# Patient Record
Sex: Female | Born: 1960 | Race: White | Hispanic: No | Marital: Married | Smoking: Current every day smoker
Health system: Southern US, Community
[De-identification: ages and names within clinical notes are randomized; demographics above are authoritative.]

## PROBLEM LIST (undated history)

## (undated) DIAGNOSIS — I10 Essential (primary) hypertension: Secondary | ICD-10-CM

## (undated) DIAGNOSIS — K922 Gastrointestinal hemorrhage, unspecified: Secondary | ICD-10-CM

## (undated) DIAGNOSIS — G620 Drug-induced polyneuropathy: Secondary | ICD-10-CM

## (undated) DIAGNOSIS — E7800 Pure hypercholesterolemia, unspecified: Secondary | ICD-10-CM

## (undated) DIAGNOSIS — Z7189 Other specified counseling: Secondary | ICD-10-CM

## (undated) DIAGNOSIS — T451X5A Adverse effect of antineoplastic and immunosuppressive drugs, initial encounter: Secondary | ICD-10-CM

## (undated) DIAGNOSIS — K219 Gastro-esophageal reflux disease without esophagitis: Secondary | ICD-10-CM

## (undated) DIAGNOSIS — C801 Malignant (primary) neoplasm, unspecified: Principal | ICD-10-CM

## (undated) HISTORY — DX: Malignant (primary) neoplasm, unspecified: C80.1

## (undated) HISTORY — DX: Other specified counseling: Z71.89

## (undated) HISTORY — PX: COLONOSCOPY: SHX174

## (undated) HISTORY — PX: TONSILLECTOMY: SUR1361

## (undated) HISTORY — DX: Drug-induced polyneuropathy: G62.0

## (undated) HISTORY — PX: ABDOMINAL HYSTERECTOMY: SHX81

## (undated) HISTORY — DX: Gastrointestinal hemorrhage, unspecified: K92.2

## (undated) HISTORY — DX: Adverse effect of antineoplastic and immunosuppressive drugs, initial encounter: T45.1X5A

---

## 2002-08-02 ENCOUNTER — Ambulatory Visit (HOSPITAL_COMMUNITY): Admission: RE | Admit: 2002-08-02 | Discharge: 2002-08-02 | Payer: Self-pay | Admitting: Internal Medicine

## 2002-08-02 ENCOUNTER — Encounter: Payer: Self-pay | Admitting: Internal Medicine

## 2002-08-11 ENCOUNTER — Emergency Department (HOSPITAL_COMMUNITY): Admission: EM | Admit: 2002-08-11 | Discharge: 2002-08-11 | Payer: Self-pay | Admitting: Emergency Medicine

## 2002-08-20 ENCOUNTER — Other Ambulatory Visit: Admission: RE | Admit: 2002-08-20 | Discharge: 2002-08-20 | Payer: Self-pay | Admitting: Obstetrics & Gynecology

## 2002-09-13 ENCOUNTER — Observation Stay (HOSPITAL_COMMUNITY): Admission: RE | Admit: 2002-09-13 | Discharge: 2002-09-14 | Payer: Self-pay | Admitting: Obstetrics & Gynecology

## 2006-04-26 ENCOUNTER — Emergency Department (HOSPITAL_COMMUNITY): Admission: EM | Admit: 2006-04-26 | Discharge: 2006-04-26 | Payer: Self-pay | Admitting: Emergency Medicine

## 2006-09-22 ENCOUNTER — Ambulatory Visit (HOSPITAL_COMMUNITY): Admission: RE | Admit: 2006-09-22 | Discharge: 2006-09-22 | Payer: Self-pay | Admitting: Family Medicine

## 2006-09-28 ENCOUNTER — Ambulatory Visit (HOSPITAL_COMMUNITY): Admission: RE | Admit: 2006-09-28 | Discharge: 2006-09-28 | Payer: Self-pay | Admitting: Family Medicine

## 2010-12-06 ENCOUNTER — Encounter: Payer: Self-pay | Admitting: Family Medicine

## 2011-04-25 ENCOUNTER — Emergency Department (HOSPITAL_COMMUNITY)
Admission: EM | Admit: 2011-04-25 | Discharge: 2011-04-25 | Disposition: A | Discharge status: Home / Self Care | Payer: PRIVATE HEALTH INSURANCE | Attending: Emergency Medicine | Admitting: Emergency Medicine

## 2011-04-25 DIAGNOSIS — K0401 Reversible pulpitis: Secondary | ICD-10-CM | POA: Insufficient documentation

## 2011-04-25 DIAGNOSIS — K047 Periapical abscess without sinus: Secondary | ICD-10-CM | POA: Insufficient documentation

## 2011-04-25 DIAGNOSIS — K089 Disorder of teeth and supporting structures, unspecified: Secondary | ICD-10-CM | POA: Insufficient documentation

## 2012-03-29 ENCOUNTER — Encounter (HOSPITAL_COMMUNITY): Payer: Self-pay | Admitting: *Deleted

## 2012-03-29 ENCOUNTER — Emergency Department (HOSPITAL_COMMUNITY)
Admission: EM | Admit: 2012-03-29 | Discharge: 2012-03-29 | Disposition: A | Discharge status: Home / Self Care | Payer: PRIVATE HEALTH INSURANCE | Attending: Emergency Medicine | Admitting: Emergency Medicine

## 2012-03-29 DIAGNOSIS — K279 Peptic ulcer, site unspecified, unspecified as acute or chronic, without hemorrhage or perforation: Secondary | ICD-10-CM | POA: Insufficient documentation

## 2012-03-29 DIAGNOSIS — E7800 Pure hypercholesterolemia, unspecified: Secondary | ICD-10-CM | POA: Insufficient documentation

## 2012-03-29 DIAGNOSIS — I10 Essential (primary) hypertension: Secondary | ICD-10-CM | POA: Insufficient documentation

## 2012-03-29 HISTORY — DX: Essential (primary) hypertension: I10

## 2012-03-29 HISTORY — DX: Pure hypercholesterolemia, unspecified: E78.00

## 2012-03-29 LAB — COMPREHENSIVE METABOLIC PANEL
AST: 17 U/L (ref 0–37)
Albumin: 3.9 g/dL (ref 3.5–5.2)
Alkaline Phosphatase: 89 U/L (ref 39–117)
BUN: 34 mg/dL — ABNORMAL HIGH (ref 6–23)
CO2: 24 mEq/L (ref 19–32)
Chloride: 104 mEq/L (ref 96–112)
GFR calc non Af Amer: >90 mL/min (ref >90)
Potassium: 3.7 mEq/L (ref 3.5–5.1)
Total Bilirubin: 0.5 mg/dL (ref 0.3–1.2)

## 2012-03-29 LAB — CBC
HCT: 43 % (ref 36.0–46.0)
Hemoglobin: 13.9 g/dL (ref 12.0–15.0)
MCHC: 32.3 g/dL (ref 30.0–36.0)
RBC: 4.67 MIL/uL (ref 3.87–5.11)
WBC: 8.8 10*3/uL (ref 4.0–10.5)

## 2012-03-29 LAB — DIFFERENTIAL
Basophils Relative: 0 % (ref 0–1)
Lymphocytes Relative: 19 % (ref 12–46)
Monocytes Relative: 5 % (ref 3–12)
Neutro Abs: 6.6 10*3/uL (ref 1.7–7.7)
Neutrophils Relative %: 75 % (ref 43–77)

## 2012-03-29 MED ORDER — PANTOPRAZOLE SODIUM 40 MG IV SOLR
40.0000 mg | Freq: Once | INTRAVENOUS | Status: AC
  Administered 2012-03-29: 40 mg via INTRAVENOUS
  Filled 2012-03-29: qty 40

## 2012-03-29 MED ORDER — PANTOPRAZOLE SODIUM 40 MG PO TBEC: DELAYED_RELEASE_TABLET | ORAL | Status: DC

## 2012-03-29 MED ORDER — ONDANSETRON 4 MG PO TBDP: ORAL_TABLET | ORAL | Status: DC

## 2012-03-29 MED ORDER — SODIUM CHLORIDE 0.9 % IV SOLN
Freq: Once | INTRAVENOUS | Status: AC
  Administered 2012-03-29: 08:00:00 via INTRAVENOUS

## 2012-03-29 NOTE — ED Provider Notes (Signed)
This chart was scribed for Lydia Lennert, MD by Williemae Natter. The patient was seen in room APA06/APA06 at 8:03 AM.  History     CSN: [id number]  Arrival date & time 03/29/12  4098   First MD Initiated Contact with Patient 03/29/12 [phone]      Chief Complaint  Patient presents with  . Hematemesis    (Consider location/radiation/quality/duration/timing/severity/associated sxs/prior treatment) Patient is a 51 y.o. female presenting with hematochezia. The history is provided by the patient. No language interpreter was used.  Rectal Bleeding  The current episode started today. The onset was sudden. The problem occurs rarely. The problem has been unchanged. The patient is experiencing no pain. The stool is described as streaked with blood. There was no prior successful therapy. There was no prior unsuccessful therapy. Associated symptoms include vomiting. Pertinent negatives include no abdominal pain, no diarrhea, no hematuria, no chest pain, no headaches, no coughing and no rash. She has been behaving normally. The last void occurred less than 6 hours ago. Her past medical history is significant for abdominal surgery. Past medical history comments: recent change in medication.   THURSA EMME is a 51 y.o. female who presents to the Emergency Department complaining of hematochezia. Pt woke up at 3 am this morning to have a bm. Stool was black and had streaks of blood in it. Pt vomited around that time and noted blood in vomit. Pt vomited again around 6. Pt regularly takes aspirin for her cholesterol and reported a recent dose change for her aspirin by her PCP. PCP at [facility] Past Medical History  Diagnosis Date  . Hypertension   . Elevated cholesterol     Past Surgical History  Procedure Date  . Abdominal hysterectomy   . Cesarean section   . Tonsillectomy     History reviewed. No pertinent family history.  History  Substance Use Topics  . Smoking status: Current Everyday  Smoker -- 1.0 packs/day  . Smokeless tobacco: Not on file  . Alcohol Use: Yes     occasionally    OB History    Grav Para Term Preterm Abortions TAB SAB Ect Mult Living                  Review of Systems  Constitutional: Negative for fatigue.  HENT: Negative for congestion, sinus pressure and ear discharge.   Eyes: Negative for discharge.  Respiratory: Negative for cough.   Cardiovascular: Negative for chest pain.  Gastrointestinal: Positive for vomiting, blood in stool and hematochezia. Negative for abdominal pain and diarrhea.  Genitourinary: Negative for frequency and hematuria.  Musculoskeletal: Negative for back pain.  Skin: Negative for rash.  Neurological: Negative for seizures and headaches.  Hematological: Negative.   Psychiatric/Behavioral: Negative for hallucinations.  All other systems reviewed and are negative.    Allergies  Review of patient's allergies indicates no known allergies.  Home Medications  No current outpatient prescriptions on file.  BP 155/101  Pulse 86  Temp(Src) 97.9 F (36.6 C) (Oral)  Resp 16  Ht 5\' 5"  (1.651 m)  Wt 160 lb (72.576 kg)  BMI 26.63 kg/m2  SpO2 97%  Physical Exam  Nursing note and vitals reviewed. Constitutional: She is oriented to person, place, and time. She appears well-developed and well-nourished.  HENT:  Head: Normocephalic and atraumatic.  Eyes: Conjunctivae and EOM are normal. No scleral icterus.  Neck: Normal range of motion. Neck supple. No thyromegaly present.  Cardiovascular: Normal rate and regular rhythm.  Exam  reveals no gallop and no friction rub.   No murmur heard. Pulmonary/Chest: Effort normal. No stridor. No respiratory distress. She has no wheezes. She has no rales. She exhibits no tenderness.  Abdominal: She exhibits no distension. There is no tenderness. There is no rebound.  Genitourinary:       Normal rectal exam-heme positive with dark brown stool  Musculoskeletal: Normal range of motion.  She exhibits no edema.  Lymphadenopathy:    She has no cervical adenopathy.  Neurological: She is alert and oriented to person, place, and time. Coordination normal.  Skin: Skin is warm and dry. No rash noted. No erythema.  Psychiatric: She has a normal mood and affect. Her behavior is normal.    ED Course  Procedures (including critical care time) DIAGNOSTIC STUDIES: Oxygen Saturation is 97% on room air, normal by my interpretation.    COORDINATION OF CARE:  Medications  aspirin 325 MG tablet (not administered)  lisinopril-hydrochlorothiazide (PRINZIDE,ZESTORETIC) 20-12.5 MG per tablet (not administered)  pravastatin (PRAVACHOL) 40 MG tablet (not administered)  doxycycline (VIBRA-TABS) 100 MG tablet (not administered)  pantoprazole (PROTONIX) injection 40 mg (40 mg Intravenous Given 03/29/12 0826)  0.9 %  sodium chloride infusion (  Intravenous New Bag/Given 03/29/12 0826)   10:29 AM On recheck, pt notified of lab results and scans.   Labs Reviewed - No data to display No results found.   No diagnosis found.  Pt had no more vomiting or blood in stool in the er  MDM    The chart was scribed for me under my direct supervision.  I personally performed the history, physical, and medical decision making and all procedures in the evaluation of this patient.Lydia Lennert, MD 03/29/12 [phone]

## 2012-03-29 NOTE — Discharge Instructions (Signed)
Follow up with your md tomorrow.  Follow up with dr. Karilyn Cota next week.

## 2012-03-29 NOTE — ED Notes (Signed)
Pt states that she woke up at 3 am and had a BM that was loose and black with bright red streaks. Pt states that she also vomited dark black with red streaks. Pt states that at 7 am she vomited bright red blood with clots. States that she started taking ASA 325 mg daily yesterday and a new cholesterol med.

## 2012-04-05 ENCOUNTER — Ambulatory Visit: Payer: PRIVATE HEALTH INSURANCE | Admitting: Urgent Care

## 2012-04-12 ENCOUNTER — Telehealth: Payer: Self-pay

## 2012-04-12 NOTE — Telephone Encounter (Signed)
Pt has OV with Lydia Moore , NP on 04/17/2012 @ 2:00 pm. Had a recent GI bleed  ( ED at [facility] )

## 2012-04-17 ENCOUNTER — Ambulatory Visit (INDEPENDENT_AMBULATORY_CARE_PROVIDER_SITE_OTHER): Payer: PRIVATE HEALTH INSURANCE | Admitting: Urgent Care

## 2012-04-17 ENCOUNTER — Encounter (HOSPITAL_COMMUNITY): Payer: Self-pay | Admitting: Pharmacy Technician

## 2012-04-17 ENCOUNTER — Other Ambulatory Visit: Payer: Self-pay | Admitting: Gastroenterology

## 2012-04-17 ENCOUNTER — Encounter: Payer: Self-pay | Admitting: Urgent Care

## 2012-04-17 VITALS — BP 168/98 | HR 80 | Temp 98.1°F | Ht 65.5 in | Wt 152.0 lb

## 2012-04-17 DIAGNOSIS — K920 Hematemesis: Secondary | ICD-10-CM | POA: Insufficient documentation

## 2012-04-17 DIAGNOSIS — K921 Melena: Secondary | ICD-10-CM | POA: Insufficient documentation

## 2012-04-17 MED ORDER — OMEPRAZOLE 20 MG PO CPDR: 20.0000 mg | DELAYED_RELEASE_CAPSULE | Freq: Every day | ORAL | Status: DC

## 2012-04-17 MED ORDER — PEG-KCL-NACL-NASULF-NA ASC-C 100 G PO SOLR: 1.0000 | Freq: Once | ORAL | Status: DC

## 2012-04-17 NOTE — Assessment & Plan Note (Addendum)
Lydia Moore is a pleasant 51 y.o. female with history of hematemesis and melena that started after one week of doxycycline and one dose of increased aspirin. Differentials include pill-induced esophageal ulcers, erosions from doxycycline and aspirin, peptic ulcer disease, less likely malignancy. Hemoglobin was normal which is reassuring.  EGD for further evaluation.  She will have colonoscopy at the same time since she is due for screening colonoscopy.  I do not suspect lower GI source. I have discussed risks & benefits which include, but are not limited to, bleeding, infection, perforation & drug reaction.  The patient agrees with this plan & written consent will be obtained.    Begin omeprazole 20 mg daily  If you have multiple dark stools or vomiting blood again, please call us or go immediately to the emergency department 1-800-quit now for help quitting smoking Be sure to start your blood pressure pills or talk with your primary care provider about your concerns

## 2012-04-17 NOTE — Patient Instructions (Addendum)
You'll need a colonoscopy and EGD (upper endoscopy) with Dr. Darrick Penna Begin omeprazole 20 mg daily to help protect and heal your stomach and esophagus If you have multiple dark stools or vomiting blood again, please call us or go immediately to the emergency department 1-800-quit now for help quitting smoking Be sure to start your blood pressure pills or talk with your primary care provider about your concerns

## 2012-04-17 NOTE — Assessment & Plan Note (Signed)
see hematemesis

## 2012-04-17 NOTE — Progress Notes (Signed)
Referring Provider: Cassell Smiles., MD Primary Care Physician:  Cassell Smiles., MD, MD Primary Gastroenterologist:  Dr. Darrick Penna  Chief Complaint  Patient presents with  . Colonoscopy  . Hematemesis  . Melena   HPI:  Lydia Moore is a 51 y.o. female here as a referral from Dr. Sherwood Gambler for hematemesis and melena and to set up colonoscopy. She was recently started on antihypertensive and statin therapy. Her aspirin was increased from 81mg  to 325mg  3 weeks ago by her PCP.  He was also started doxycycline 1 week prior to onset of her symptoms as she was being treated for a tick bite.  Approximately 3am, she awakened with nausea & vomiting bright red blood w/ clots.  He had melena for 2 days.  She went to [facility] ER about 7:30am day the day hematemesis began.  No NSAIDs.  Denies abdominal pain, heartburn or indigestion.  Denies anorexia or early satiety.  Denies dysphagia or odynophagia.   Denies any lower GI symptoms including constipation, diarrhea, rectal bleeding, or weight loss.  Recent Results (from the past 672 hour(s))  CBC   Collection Time   03/29/12  8:27 AM      Component Value Range   WBC 8.8  4.0 - 10.5 (K/uL)   RBC 4.67  3.87 - 5.11 (MIL/uL)   Hemoglobin 13.9  12.0 - 15.0 (g/dL)   HCT 16.1  09.6 - 04.5 (%)   MCV 92.1  78.0 - 100.0 (fL)   MCH 29.8  26.0 - 34.0 (pg)   MCHC 32.3  30.0 - 36.0 (g/dL)   RDW 40.9  81.1 - 91.4 (%)   Platelets 155  150 - 400 (K/uL)  DIFFERENTIAL   Collection Time   03/29/12  8:27 AM      Component Value Range   Neutrophils Relative 75  43 - 77 (%)   Neutro Abs 6.6  1.7 - 7.7 (K/uL)   Lymphocytes Relative 19  12 - 46 (%)   Lymphs Abs 1.7  0.7 - 4.0 (K/uL)   Monocytes Relative 5  3 - 12 (%)   Monocytes Absolute 0.4  0.1 - 1.0 (K/uL)   Eosinophils Relative 1  0 - 5 (%)   Eosinophils Absolute 0.1  0.0 - 0.7 (K/uL)   Basophils Relative 0  0 - 1 (%)   Basophils Absolute 0.0  0.0 - 0.1 (K/uL)  COMPREHENSIVE METABOLIC PANEL   Collection Time   03/29/12  8:27 AM      Component Value Range   Sodium 141  135 - 145 (mEq/L)   Potassium 3.7  3.5 - 5.1 (mEq/L)   Chloride 104  96 - 112 (mEq/L)   CO2 24  19 - 32 (mEq/L)   Glucose, Bld 111 (*) 70 - 99 (mg/dL)   BUN 34 (*) 6 - 23 (mg/dL)   Creatinine, Ser 7.82  0.50 - 1.10 (mg/dL)   Calcium 9.4  8.4 - 95.6 (mg/dL)   Total Protein 7.0  6.0 - 8.3 (g/dL)   Albumin 3.9  3.5 - 5.2 (g/dL)   AST 17  0 - 37 (U/L)   ALT 16  0 - 35 (U/L)   Alkaline Phosphatase 89  39 - 117 (U/L)   Total Bilirubin 0.5  0.3 - 1.2 (mg/dL)   GFR calc non Af Amer >90  >90 (mL/min)   GFR calc Af Amer >90  >90 (mL/min)   Past Medical History  Diagnosis Date  . Hypertension   . Elevated cholesterol  Past Surgical History  Procedure Date  . Abdominal hysterectomy     partial  . Cesarean section   . Tonsillectomy     Current Outpatient Prescriptions  Medication Sig Dispense Refill  . acetaminophen (TYLENOL) 500 MG tablet Take 1,000 mg by mouth every 6 (six) hours as needed. Pain      . omeprazole (PRILOSEC) 20 MG capsule Take 1 capsule (20 mg total) by mouth daily.  31 capsule  1  . peg 3350 powder (MOVIPREP) 100 G SOLR Take 1 kit (100 g total) by mouth once. Please give one fleet enemia  1 kit  0    Allergies as of 04/17/2012  . (No Known Allergies)   Family history:There is no known family history of colorectal carcinoma , liver disease, or inflammatory bowel disease.  History   Social History  . Marital Status: Married    Spouse Name: N/A    Number of Children: 4  . Years of Education: N/A   Occupational History  . [employer]    Social History Main Topics  . Smoking status: Current Everyday Smoker -- 1.0 packs/day for 35 years  . Smokeless tobacco: Not on file  . Alcohol Use: Yes     occasionallyCouple drinks per mo  . Drug Use: No  . Sexually Active: Yes    Birth Control/ Protection: Surgical   Other Topics Concern  . Not on file   Social History Narrative   Lives w/ husband     Review of Systems: Gen: Denies any fever, chills, sweats, anorexia, fatigue, weakness, malaise, weight loss, and sleep disorder CV: Denies chest pain, angina, palpitations, syncope, orthopnea, PND, peripheral edema, and claudication. Resp: Denies dyspnea at rest, dyspnea with exercise, cough, sputum, wheezing, coughing up blood, and pleurisy. GI: Denies jaundice and fecal incontinence.    GU : Denies urinary burning, blood in urine, urinary frequency, urinary hesitancy, nocturnal urination, and urinary incontinence. MS: Denies joint pain, limitation of movement, and swelling, stiffness, low back pain, extremity pain. Denies muscle weakness, cramps, atrophy.  Derm: Denies rash, itching, dry skin, hives, moles, warts, or unhealing ulcers.  Psych: Denies depression, anxiety, memory loss, suicidal ideation, hallucinations, paranoia, and confusion. Heme: Denies bruising, bleeding, and enlarged lymph nodes. Neuro:  Denies any headaches, dizziness, paresthesias. Endo:  Denies any problems with DM, thyroid, adrenal function.  Physical Exam: BP 168/98  Pulse 80  Temp(Src) 98.1 F (36.7 C) (Temporal)  Ht 5' 5.5" (1.664 m)  Wt 152 lb (68.947 kg)  BMI 24.91 kg/m2 General:   Alert,  Well-developed, well-nourished, pleasant and cooperative in NAD.  Accompanied by her daughter today.  Head:  Normocephalic and atraumatic. Eyes:  Sclera clear, no icterus.   Conjunctiva pink. Ears:  Normal auditory acuity. Nose:  No deformity, discharge, or lesions. Mouth:  No deformity or lesions,oropharynx pink & moist. Neck:  Supple; no masses or thyromegaly. Lungs:  Clear throughout to auscultation.   No wheezes, crackles, or rhonchi. No acute distress. Heart:  Regular rate and rhythm; no murmurs, clicks, rubs,  or gallops. Abdomen:  Normal bowel sounds.  No bruits.  Soft, non-tender and non-distended without masses, hepatosplenomegaly or hernias noted.  No guarding or rebound tenderness.   Rectal:  Deferred.   Msk:  Symmetrical without gross deformities. Normal posture. Pulses:  Normal pulses noted. Extremities:  No clubbing or edema. Neurologic:  Alert and oriented x4;  grossly normal neurologically. Skin:  Intact without significant lesions or rashes. Lymph Nodes:  No significant cervical adenopathy. Psych:  Alert and cooperative. Normal mood and affect.

## 2012-04-18 NOTE — Progress Notes (Signed)
Faxed to PCP

## 2012-04-20 ENCOUNTER — Ambulatory Visit (HOSPITAL_COMMUNITY)
Admission: RE | Admit: 2012-04-20 | Discharge: 2012-04-20 | Disposition: A | Discharge status: Home / Self Care | Payer: PRIVATE HEALTH INSURANCE | Source: Ambulatory Visit | Attending: Gastroenterology | Admitting: Gastroenterology

## 2012-04-20 ENCOUNTER — Encounter (HOSPITAL_COMMUNITY): Payer: Self-pay | Admitting: *Deleted

## 2012-04-20 ENCOUNTER — Encounter (HOSPITAL_COMMUNITY)
Admission: RE | Disposition: A | Discharge status: Home / Self Care | Payer: Self-pay | Source: Ambulatory Visit | Attending: Gastroenterology

## 2012-04-20 DIAGNOSIS — K222 Esophageal obstruction: Secondary | ICD-10-CM | POA: Insufficient documentation

## 2012-04-20 DIAGNOSIS — Z1211 Encounter for screening for malignant neoplasm of colon: Secondary | ICD-10-CM

## 2012-04-20 DIAGNOSIS — K648 Other hemorrhoids: Secondary | ICD-10-CM

## 2012-04-20 DIAGNOSIS — Z79899 Other long term (current) drug therapy: Secondary | ICD-10-CM | POA: Insufficient documentation

## 2012-04-20 DIAGNOSIS — K5730 Diverticulosis of large intestine without perforation or abscess without bleeding: Secondary | ICD-10-CM

## 2012-04-20 DIAGNOSIS — K449 Diaphragmatic hernia without obstruction or gangrene: Secondary | ICD-10-CM | POA: Insufficient documentation

## 2012-04-20 DIAGNOSIS — K920 Hematemesis: Secondary | ICD-10-CM

## 2012-04-20 DIAGNOSIS — I10 Essential (primary) hypertension: Secondary | ICD-10-CM | POA: Insufficient documentation

## 2012-04-20 DIAGNOSIS — K921 Melena: Secondary | ICD-10-CM | POA: Insufficient documentation

## 2012-04-20 DIAGNOSIS — E7800 Pure hypercholesterolemia, unspecified: Secondary | ICD-10-CM | POA: Insufficient documentation

## 2012-04-20 DIAGNOSIS — K2940 Chronic atrophic gastritis without bleeding: Secondary | ICD-10-CM | POA: Insufficient documentation

## 2012-04-20 HISTORY — DX: Gastro-esophageal reflux disease without esophagitis: K21.9

## 2012-04-20 SURGERY — COLONOSCOPY WITH ESOPHAGOGASTRODUODENOSCOPY (EGD)
Anesthesia: Moderate Sedation

## 2012-04-20 MED ORDER — MIDAZOLAM HCL 5 MG/5ML IJ SOLN
INTRAMUSCULAR | Status: DC | PRN
  Administered 2012-04-20 (×3): 2 mg via INTRAVENOUS

## 2012-04-20 MED ORDER — MIDAZOLAM HCL 5 MG/5ML IJ SOLN
INTRAMUSCULAR | Status: AC
  Filled 2012-04-20: qty 10

## 2012-04-20 MED ORDER — BUTAMBEN-TETRACAINE-BENZOCAINE 2-2-14 % EX AERO
INHALATION_SPRAY | CUTANEOUS | Status: DC | PRN
  Administered 2012-04-20: 2 via TOPICAL

## 2012-04-20 MED ORDER — MEPERIDINE HCL 100 MG/ML IJ SOLN
INTRAMUSCULAR | Status: DC | PRN
  Administered 2012-04-20 (×2): 50 mg via INTRAVENOUS
  Administered 2012-04-20: 25 mg via INTRAVENOUS

## 2012-04-20 MED ORDER — STERILE WATER FOR IRRIGATION IR SOLN
Status: DC | PRN
  Administered 2012-04-20: 12:00:00

## 2012-04-20 MED ORDER — MEPERIDINE HCL 100 MG/ML IJ SOLN
INTRAMUSCULAR | Status: AC
  Filled 2012-04-20: qty 1

## 2012-04-20 MED ORDER — SODIUM CHLORIDE 0.45 % IV SOLN
Freq: Once | INTRAVENOUS | Status: AC
  Administered 2012-04-20: 11:00:00 via INTRAVENOUS

## 2012-04-20 NOTE — H&P (Signed)
  Primary Care Physician:  Cassell Smiles., MD, MD Primary Gastroenterologist:  Dr. Darrick Penna  Pre-Procedure History & Physical: HPI:  Lydia Moore is a 51 y.o. female here for HEMATEMESIS/MELENA/COLON CANCER SCREENING.  Past Medical History  Diagnosis Date  . Hypertension   . Elevated cholesterol   . GERD (gastroesophageal reflux disease)     Past Surgical History  Procedure Date  . Abdominal hysterectomy     partial  . Cesarean section   . Tonsillectomy     Prior to Admission medications   Medication Sig Start Date End Date Taking? Authorizing Provider  acetaminophen (TYLENOL) 500 MG tablet Take 1,000 mg by mouth every 6 (six) hours as needed. Pain   Yes Historical Provider, MD  lisinopril-hydrochlorothiazide (PRINZIDE,ZESTORETIC) 20-12.5 MG per tablet Take 0.5 tablets by mouth daily.   Yes Historical Provider, MD  omeprazole (PRILOSEC) 20 MG capsule Take 1 capsule (20 mg total) by mouth daily. 04/17/12 04/17/13 Yes Joselyn Arrow, NP  peg 3350 powder (MOVIPREP) 100 G SOLR Take 1 kit (100 g total) by mouth once. Please give one fleet enemia 04/17/12  Yes [place] Bali, MD    Allergies as of 04/17/2012  . (No Known Allergies)    No family history on file.  History   Social History  . Marital Status: Married    Spouse Name: N/A    Number of Children: 4  . Years of Education: N/A   Occupational History  . [employer]    Social History Main Topics  . Smoking status: Current Everyday Smoker -- 1.0 packs/day for 35 years    Types: Cigarettes  . Smokeless tobacco: Not on file  . Alcohol Use: Yes     occasionally, Couple drinks per mo  . Drug Use: No  . Sexually Active: Yes    Birth Control/ Protection: Surgical   Other Topics Concern  . Not on file   Social History Narrative   Lives w/ husband    Review of Systems: See HPI, otherwise negative ROS   Physical Exam: BP 146/82  Pulse 77  Temp(Src) 98.4 F (36.9 C) (Oral)  Resp 22  Ht 5\' 5"  (1.651 m)  Wt  148 lb (67.132 kg)  BMI 24.63 kg/m2  SpO2 97% General:   Alert,  pleasant and cooperative in NAD Head:  Normocephalic and atraumatic. Neck:  Supple;  Lungs:  Clear throughout to auscultation.    Heart:  Regular rate and rhythm. Abdomen:  Soft, nontender and nondistended. Normal bowel sounds, without guarding, and without rebound.   Neurologic:  Alert and  oriented x4;  grossly normal neurologically.  Impression/Plan:     MELENA/screening  PLAN: 1. EGD/TCS TODAY

## 2012-04-20 NOTE — Op Note (Signed)
[facility] [address] [place], [place]  [address]  COLONOSCOPY PROCEDURE REPORT  PATIENT:  Lydia, Moore  MR#:  [id number] BIRTHDATE:  1961/02/20, 50 yrs. old  GENDER:  female  ENDOSCOPIST:  Jonette Eva, MD REF. BY:  Artis Delay, M.D. ASSISTANT:  PROCEDURE DATE:  04/20/2012 PROCEDURE:  ILEOColonoscopy 19147  INDICATIONS:  Screening  MEDICATIONS:   Demerol 125 mg IV, Versed 6 mg IV  DESCRIPTION OF PROCEDURE:    Physical exam was performed. Informed consent was obtained from the patient after explaining the benefits, risks, and alternatives to procedure.  The patient was connected to monitor and placed in left lateral position. Continuous oxygen was provided by nasal cannula and IV medicine administered through an indwelling cannula.  After administration of sedation and rectal exam, the patient's rectum was intubated and the EC-[id number] ([id number]) colonoscope was advanced under direct visualization to the ILEUM.  The scope was removed slowly by carefully examining the color, texture, anatomy, and integrity mucosa on the way out.  The patient was recovered in endoscopy and discharged home in satisfactory condition. <<PROCEDUREIMAGES>>  FINDINGS:  RARE Diverticula were found IN THE ASCENDING & SIGMOD COLON.  LARGE Internal Hemorrhoids were found.  PREP QUALITY: EXCELLENT CECAL W/D TIME:    11 minutes  COMPLICATIONS:    None  ENDOSCOPIC IMPRESSION: Mild diverticulosis Large internal hemorrhoids  RECOMMENDATIONS: HIGH FIBER DIET TCS IN 10 YEARS  REPEAT EXAM:  No  ______________________________ Jonette Eva, MD  CC:  Artis Delay, M.D.  n. eSIGNED:   Amaro Mangold at 04/20/2012 12:22 PM  Burnell Blanks, [phone]

## 2012-04-20 NOTE — Discharge Instructions (Signed)
YOU MOST LIKELY THREW UP BLOOD FROM A TEAR IN YOUR ESOPHAGUS. You have internal hemorrhoids & diverticulosis. 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 IT CAN CAUSE PROBLEMS WITH SWALLOWING SOLID FOOD. YOU HAVE  gastritis & A HIATAL HERNIA. I biopsied your stomach.    CONTINUE OMEPRAZOLE EVERY MORNING. TAKE 30 MINUTES PRIOR TO YOUR FIRST MEAL. AVOID ITEMS THAT TRIGGER GASTRITIS. SEE INFO BELOW. YOU CAN RESTART ASPIRIN June 21.  FOLLOW A HIGH FIBER/LOW FAT DIET. AVOID ITEMS THAT CAUSE BLOATING. SEE INFO BELOW.  YOUR BIOPSY RESULTS WILL BE AVAILABLE IN 7 DAYS. NEXT colonoscopy in 10 years.  FOLLOW UP IN 6 MOS.   ENDOSCOPY Care After Read the instructions outlined below and refer to this sheet in the next week. These discharge instructions provide you with general information on caring for yourself after you leave the hospital. While your treatment has been planned according to the most current medical practices available, unavoidable complications occasionally occur. If you have any problems or questions after discharge, call DR. Trinty Marken, [phone].  ACTIVITY  You may resume your regular activity, but move at a slower pace for the next 24 hours.   Take frequent rest periods for the next 24 hours.   Walking will help get rid of the air and reduce the bloated feeling in your belly (abdomen).   No driving for 24 hours (because of the medicine (anesthesia) used during the test).   You may shower.   Do not sign any important legal documents or operate any machinery for 24 hours (because of the anesthesia used during the test).    NUTRITION  Drink plenty of fluids.   You may resume your normal diet as instructed by your doctor.   Begin with a light meal and progress to your normal diet. Heavy or fried foods are harder to digest and may make you feel sick to your stomach (nauseated).   Avoid alcoholic beverages for 24 hours or as instructed.     MEDICATIONS  You may resume your normal medications.   WHAT YOU CAN EXPECT TODAY  Some feelings of bloating in the abdomen.   Passage of more gas than usual.   Spotting of blood in your stool or on the toilet paper  .  IF YOU HAD POLYPS REMOVED DURING THE ENDOSCOPY:  Eat a soft diet IF YOU HAVE NAUSEA, BLOATING, ABDOMINAL PAIN, OR VOMITING.    FINDING OUT THE RESULTS OF YOUR TEST Not all test results are available during your visit. DR. Darrick Penna WILL CALL YOU WITHIN 7 DAYS OF YOUR PROCEDUE WITH YOUR RESULTS. Do not assume everything is normal if you have not heard from DR. Marquisa Salih IN ONE WEEK, CALL HER OFFICE AT [phone].  SEEK IMMEDIATE MEDICAL ATTENTION AND CALL THE OFFICE: [phone] IF:  You have more than a spotting of blood in your stool.   Your belly is swollen (abdominal distention).   You are nauseated or vomiting.   You have a temperature over 101F.   You have abdominal pain or discomfort that is severe or gets worse throughout the day.   Gastritis  Gastritis is an inflammation (the body's way of reacting to injury and/or infection) of the stomach. It is often caused by viral or bacterial (germ) infections. It can also be caused BY ASPIRIN, BC/GOODY POWDER'S, (IBUPROFEN) MOTRIN, OR ALEVE (NAPROXEN), chemicals (including alcohol), SPICY FOODS, and medications. This illness may be associated with generalized malaise (feeling tired, not well), UPPER ABDOMINAL  STOMACH cramps, and fever. One common bacterial cause of gastritis is an organism known as H. Pylori. This can be treated with antibiotics.    High-Fiber Diet A high-fiber diet changes your normal diet to include more whole grains, legumes, fruits, and vegetables. Changes in the diet involve replacing refined carbohydrates with unrefined foods. The calorie level of the diet is essentially unchanged. The Dietary Reference Intake (recommended amount) for adult males is 38 grams per day. For adult females,  it is 25 grams per day. Pregnant and lactating women should consume 28 grams of fiber per day. Fiber is the intact part of a plant that is not broken down during digestion. Functional fiber is fiber that has been isolated from the plant to provide a beneficial effect in the body. PURPOSE  Increase stool bulk.   Ease and regulate bowel movements.   Lower cholesterol.  INDICATIONS THAT YOU NEED MORE FIBER  Constipation and hemorrhoids.   Uncomplicated diverticulosis (intestine condition) and irritable bowel syndrome.   Weight management.   As a protective measure against hardening of the arteries (atherosclerosis), diabetes, and cancer.   GUIDELINES FOR INCREASING FIBER IN THE DIET  Start adding fiber to the diet slowly. A gradual increase of about 5 more grams (2 slices of whole-wheat bread, 2 servings of most fruits or vegetables, or 1 bowl of high-fiber cereal) per day is best. Too rapid an increase in fiber may result in constipation, flatulence, and bloating.   Drink enough water and fluids to keep your urine clear or pale yellow. Water, juice, or caffeine-free drinks are recommended. Not drinking enough fluid may cause constipation.   Eat a variety of high-fiber foods rather than one type of fiber.   Try to increase your intake of fiber through using high-fiber foods rather than fiber pills or supplements that contain small amounts of fiber.   The goal is to change the types of food eaten. Do not supplement your present diet with high-fiber foods, but replace foods in your present diet.  INCLUDE A VARIETY OF FIBER SOURCES  Replace refined and processed grains with whole grains, canned fruits with fresh fruits, and incorporate other fiber sources. White rice, white breads, and most bakery goods contain little or no fiber.   Brown whole-grain rice, buckwheat oats, and many fruits and vegetables are all good sources of fiber. These include: broccoli, Brussels sprouts, cabbage,  cauliflower, beets, sweet potatoes, white potatoes (skin on), carrots, tomatoes, eggplant, squash, berries, fresh fruits, and dried fruits.   Cereals appear to be the richest source of fiber. Cereal fiber is found in whole grains and bran. Bran is the fiber-rich outer coat of cereal grain, which is largely removed in refining. In whole-grain cereals, the bran remains. In breakfast cereals, the largest amount of fiber is found in those with "bran" in their names. The fiber content is sometimes indicated on the label.   You may need to include additional fruits and vegetables each day.   In baking, for 1 cup white flour, you may use the following substitutions:   1 cup whole-wheat flour minus 2 tablespoons.   1/2 cup white flour plus 1/2 cup whole-wheat flour.   Low-Fat Diet BREADS, CEREALS, PASTA, RICE, DRIED PEAS, AND BEANS These products are high in carbohydrates and most are low in fat. Therefore, they can be increased in the diet as substitutes for fatty foods. They too, however, contain calories and should not be eaten in excess. Cereals can be eaten for snacks as well  as for breakfast.  Include foods that contain fiber (fruits, vegetables, whole grains, and legumes). Research shows that fiber may lower blood cholesterol levels, especially the water-soluble fiber found in fruits, vegetables, oat products, and legumes. FRUITS AND VEGETABLES It is good to eat fruits and vegetables. Besides being sources of fiber, both are rich in vitamins and some minerals. They help you get the daily allowances of these nutrients. Fruits and vegetables can be used for snacks and desserts. MEATS Limit lean meat, chicken, [place], and fish to no more than 6 ounces per day. Beef, Pork, and Lamb Use lean cuts of beef, pork, and lamb. Lean cuts include:  Extra-lean ground beef.  Arm roast.  Sirloin tip.  Center-cut ham.  Round steak.  Loin chops.  Rump roast.  Tenderloin.  Trim all fat off the outside of  meats before cooking. It is not necessary to severely decrease the intake of red meat, but lean choices should be made. Lean meat is rich in protein and contains a highly absorbable form of iron. Premenopausal women, in particular, should avoid reducing lean red meat because this could increase the risk for low red blood cells (iron-deficiency anemia). The organ meats, such as liver, sweetbreads, kidneys, and brain are very rich in cholesterol. They should be limited. Chicken and [place] These are good sources of protein. The fat of poultry can be reduced by removing the skin and underlying fat layers before cooking. Chicken and [place] can be substituted for lean red meat in the diet. Poultry should not be fried or covered with high-fat sauces. Fish and Shellfish Fish is a good source of protein. Shellfish contain cholesterol, but they usually are low in saturated fatty acids. The preparation of fish is important. Like chicken and [place], they should not be fried or covered with high-fat sauces. EGGS Egg whites contain no fat or cholesterol. They can be eaten often. Try 1 to 2 egg whites instead of whole eggs in recipes or use egg substitutes that do not contain yolk. MILK AND DAIRY PRODUCTS Use skim or 1% milk instead of 2% or whole milk. Decrease whole milk, natural, and processed cheeses. Use nonfat or low-fat (2%) cottage cheese or low-fat cheeses made from vegetable oils. Choose nonfat or low-fat (1 to 2%) yogurt. Experiment with evaporated skim milk in recipes that call for heavy cream. Substitute low-fat yogurt or low-fat cottage cheese for sour cream in dips and salad dressings. Have at least 2 servings of low-fat dairy products, such as 2 glasses of skim (or 1%) milk each day to help get your daily calcium intake.  FATS AND OILS Reduce the total intake of fats, especially saturated fat. Butterfat, lard, and beef fats are high in saturated fat and cholesterol. These should be avoided as much as  possible. Vegetable fats do not contain cholesterol, but certain vegetable fats, such as coconut oil, palm oil, and palm kernel oil are very high in saturated fats. These should be limited. These fats are often used in bakery goods, processed foods, popcorn, oils, and nondairy creamers. Vegetable shortenings and some peanut butters contain hydrogenated oils, which are also saturated fats. Read the labels on these foods and check for saturated vegetable oils. Unsaturated vegetable oils and fats do not raise blood cholesterol. However, they should be limited because they are fats and are high in calories. Total fat should still be limited to 30% of your daily caloric intake. Desirable liquid vegetable oils are corn oil, cottonseed oil, olive oil, canola oil, safflower  oil, soybean oil, and sunflower oil. Peanut oil is not as good, but small amounts are acceptable. Buy a heart-healthy tub margarine that has no partially hydrogenated oils in the ingredients. Mayonnaise and salad dressings often are made from unsaturated fats, but they should also be limited because of their high calorie and fat content. Seeds, nuts, peanut butter, olives, and avocados are high in fat, but the fat is mainly the unsaturated type. These foods should be limited mainly to avoid excess calories and fat. OTHER EATING TIPS Snacks  Most sweets should be limited as snacks. They tend to be rich in calories and fats, and their caloric content outweighs their nutritional value. Some good choices in snacks are graham crackers, melba toast, soda crackers, bagels (no egg), English muffins, fruits, and vegetables. These snacks are preferable to snack crackers, [place] fries, and chips. Popcorn should be air-popped or cooked in small amounts of liquid vegetable oil. Desserts Eat fruit, low-fat yogurt, and fruit ices. AVOID pastries, cake, and cookies. Sherbet, angel food cake, gelatin dessert, frozen low-fat yogurt, or other frozen products that do  not contain saturated fat (pure fruit juice bars, frozen ice pops) are also acceptable.  COOKING METHODS Choose those methods that use little or no fat. They include: Poaching.  Braising.  Steaming.  Grilling.  Baking.  Stir-frying.  Broiling.  Microwaving.  Foods can be cooked in a nonstick pan without added fat, or use a nonfat cooking spray in regular cookware. Limit fried foods and avoid frying in saturated fat. Add moisture to lean meats by using water, broth, cooking wines, and other nonfat or low-fat sauces along with the cooking methods mentioned above. Soups and stews should be chilled after cooking. The fat that forms on top after a few hours in the refrigerator should be skimmed off. When preparing meals, avoid using excess salt. Salt can contribute to raising blood pressure in some people. EATING AWAY FROM HOME Order entres, potatoes, and vegetables without sauces or butter. When meat exceeds the size of a deck of cards (3 to 4 ounces), the rest can be taken home for another meal. Choose vegetable or fruit salads and ask for low-calorie salad dressings to be served on the side. Use dressings sparingly. Limit high-fat toppings, such as bacon, crumbled eggs, cheese, sunflower seeds, and olives. Ask for heart-healthy tub margarine instead of butter.   Diverticulosis Diverticulosis is a common condition that develops when small pouches (diverticula) form in the wall of the colon. The risk of diverticulosis increases with age. It happens more often in people who eat a low-fiber diet. Most individuals with diverticulosis have no symptoms. Those individuals with symptoms usually experience belly (abdominal) pain, constipation, or loose stools (diarrhea).  HOME CARE INSTRUCTIONS  Increase the amount of fiber in your diet as directed by your caregiver or dietician. This may reduce symptoms of diverticulosis.   Drink at least 6 to 8 glasses of water each day to prevent constipation.    Try not to strain when you have a bowel movement.   Avoiding nuts and seeds to prevent complications is still an uncertain benefit.       FOODS HAVING HIGH FIBER CONTENT INCLUDE:  Fruits. Apple, peach, pear, tangerine, raisins, prunes.   Vegetables. Brussels sprouts, asparagus, broccoli, cabbage, carrot, cauliflower, romaine lettuce, spinach, summer squash, tomato, winter squash, zucchini.   Starchy Vegetables. Baked beans, kidney beans, lima beans, split peas, lentils, potatoes (with skin).   Grains. Whole wheat bread, brown rice, bran flake cereal, plain  oatmeal, white rice, shredded wheat, bran muffins.    SEEK IMMEDIATE MEDICAL CARE IF:  You develop increasing pain or severe bloating.   You have an oral temperature above 101F.   You develop vomiting or bowel movements that are bloody or black.    Hemorrhoids Hemorrhoids are dilated (enlarged) veins around the rectum. Sometimes clots will form in the veins. This makes them swollen and painful. These are called thrombosed hemorrhoids. Causes of hemorrhoids include:  Constipation.   Straining to have a bowel movement.   HEAVY LIFTING HOME CARE INSTRUCTIONS  Eat a well balanced diet and drink 6 to 8 glasses of water every day to avoid constipation. You may also use a bulk laxative.   Avoid straining to have bowel movements.   Keep anal area dry and clean.   Do not use a donut shaped pillow or sit on the toilet for long periods. This increases blood pooling and pain.   Move your bowels when your body has the urge; this will require less straining and will decrease pain and pressure.

## 2012-04-20 NOTE — Progress Notes (Signed)
REVIEWED.  

## 2012-04-20 NOTE — Op Note (Signed)
[facility] [address] [place], [place]  [address]  ENDOSCOPY PROCEDURE REPORT  PATIENT:  Lydia Moore, Lydia Moore  MR#:  [id number] BIRTHDATE:  1960-12-03, 50 yrs. old  GENDER:  female  ENDOSCOPIST:  Jonette Eva, MD Referred by:  Artis Delay, M.D.  PROCEDURE DATE:  04/20/2012 PROCEDURE:  EGD with biopsy, 43239 ASA CLASS: INDICATIONS:  hematemesis, melena  MEDICATIONS:  None TOPICAL ANESTHETIC:  Cetacaine Spray  DESCRIPTION OF PROCEDURE:     Physical exam was performed. Informed consent was obtained from the patient after explaining the benefits, risks, and alternatives to the procedure.  The patient was connected to the monitor and placed in the left lateral position.  Continuous oxygen was provided by nasal cannula and IV medicine administered through an indwelling cannula.  After administration of sedation, the patient's esophagus was intubated and the EC-[id number] ([id number]) and [id number] ([id number]) endoscope was advanced under direct visualization to the second portion of the duodenum.  The scope was removed slowly by carefully examining the color, texture, anatomy, and integrity of the mucosa on the way out.  The patient was recovered in endoscopy and discharged home in satisfactory condition. 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 NO BARRETT'S.  Mild gastritis was found & BIOPSIED VIA COLD FORCEPS. A 1-2 CM SLIDING hiatal hernia was found. NL DUODENUM  COMPLICATIONS:    None  ENDOSCOPIC IMPRESSION: 1) Stricture in the distal esophagus 2) Mild gastritis 3) Hiatal hernia 4)HEMATEMESIS MOST LIKEY DUE TO MW TEAR  RECOMMENDATIONS: AWAIT BIOPSY START BABY ASA IN 2 WEEKS. OMEPRAZOLE 30 MINUTES PRIOR TO FIRST MEAL DAILY LOW FAT/HIGH FIBER DIET OPV IN 6 MOS  REPEAT EXAM:  No  ______________________________ Jonette Eva, MD  CC:  n. eSIGNED:   Floriene Jeschke at 04/20/2012 12:28 PM  Burnell Blanks, [phone]

## 2012-04-25 ENCOUNTER — Telehealth: Payer: Self-pay | Admitting: Gastroenterology

## 2012-04-25 NOTE — Telephone Encounter (Signed)
Please call pt. HER stomach Bx shows mild gastritis DUE TO ASPIRIN USE.    CONTINUE OMEPRAZOLE EVERY MORNING. TAKE 30 MINUTES PRIOR TO YOUR FIRST MEAL. AVOID ITEMS THAT TRIGGER GASTRITIS.   YOU CAN RESTART ASPIRIN June 21.  FOLLOW A HIGH FIBER/LOW FAT DIET. AVOID ITEMS THAT CAUSE BLOATING.   OPV IN 6 MOS FOR GERD.  NEXT colonoscopy in 10 years.

## 2012-04-25 NOTE — Telephone Encounter (Signed)
Pt called back and is aware of results.  

## 2012-04-25 NOTE — Telephone Encounter (Signed)
Tried to call [facility] to call back

## 2012-04-25 NOTE — Telephone Encounter (Signed)
Results Cc to PCP & reminder is in the computer 

## 2012-07-20 ENCOUNTER — Other Ambulatory Visit: Payer: Self-pay

## 2012-07-20 MED ORDER — OMEPRAZOLE 20 MG PO CPDR: 20.0000 mg | DELAYED_RELEASE_CAPSULE | Freq: Every day | ORAL | Status: DC

## 2012-09-18 ENCOUNTER — Other Ambulatory Visit (HOSPITAL_COMMUNITY): Payer: Self-pay | Admitting: Family Medicine

## 2012-09-18 DIAGNOSIS — Z139 Encounter for screening, unspecified: Secondary | ICD-10-CM

## 2012-09-25 ENCOUNTER — Ambulatory Visit (HOSPITAL_COMMUNITY): Payer: PRIVATE HEALTH INSURANCE

## 2012-10-16 ENCOUNTER — Encounter: Payer: Self-pay | Admitting: Gastroenterology

## 2012-11-06 ENCOUNTER — Ambulatory Visit (HOSPITAL_COMMUNITY): Payer: PRIVATE HEALTH INSURANCE

## 2012-11-14 ENCOUNTER — Ambulatory Visit (HOSPITAL_COMMUNITY)
Admission: RE | Admit: 2012-11-14 | Discharge: 2012-11-14 | Disposition: A | Discharge status: Home / Self Care | Payer: PRIVATE HEALTH INSURANCE | Source: Ambulatory Visit | Attending: Family Medicine | Admitting: Family Medicine

## 2012-11-14 DIAGNOSIS — Z1231 Encounter for screening mammogram for malignant neoplasm of breast: Secondary | ICD-10-CM | POA: Insufficient documentation

## 2012-11-14 DIAGNOSIS — Z139 Encounter for screening, unspecified: Secondary | ICD-10-CM

## 2016-07-12 ENCOUNTER — Emergency Department (HOSPITAL_COMMUNITY): Payer: Managed Care, Other (non HMO)

## 2016-07-12 ENCOUNTER — Encounter (HOSPITAL_COMMUNITY): Payer: Self-pay | Admitting: Emergency Medicine

## 2016-07-12 ENCOUNTER — Emergency Department (HOSPITAL_COMMUNITY)
Admission: EM | Admit: 2016-07-12 | Discharge: 2016-07-12 | Disposition: A | Discharge status: Home / Self Care | Payer: Managed Care, Other (non HMO) | Attending: Emergency Medicine | Admitting: Emergency Medicine

## 2016-07-12 DIAGNOSIS — F17210 Nicotine dependence, cigarettes, uncomplicated: Secondary | ICD-10-CM | POA: Insufficient documentation

## 2016-07-12 DIAGNOSIS — Z791 Long term (current) use of non-steroidal anti-inflammatories (NSAID): Secondary | ICD-10-CM | POA: Insufficient documentation

## 2016-07-12 DIAGNOSIS — M549 Dorsalgia, unspecified: Secondary | ICD-10-CM | POA: Diagnosis not present

## 2016-07-12 DIAGNOSIS — I10 Essential (primary) hypertension: Secondary | ICD-10-CM | POA: Insufficient documentation

## 2016-07-12 DIAGNOSIS — R109 Unspecified abdominal pain: Secondary | ICD-10-CM | POA: Diagnosis not present

## 2016-07-12 DIAGNOSIS — R918 Other nonspecific abnormal finding of lung field: Secondary | ICD-10-CM | POA: Diagnosis not present

## 2016-07-12 DIAGNOSIS — R05 Cough: Secondary | ICD-10-CM | POA: Diagnosis present

## 2016-07-12 LAB — URINALYSIS, ROUTINE W REFLEX MICROSCOPIC
Bilirubin Urine: NEGATIVE
Glucose, UA: NEGATIVE mg/dL
Hgb urine dipstick: NEGATIVE
LEUKOCYTES UA: NEGATIVE
Nitrite: NEGATIVE
pH: 5.5 (ref 5.0–8.0)

## 2016-07-12 LAB — COMPREHENSIVE METABOLIC PANEL
ALK PHOS: 101 U/L (ref 38–126)
ALT: 19 U/L (ref 14–54)
AST: 23 U/L (ref 15–41)
Albumin: 4.6 g/dL (ref 3.5–5.0)
Anion gap: 6 (ref 5–15)
BUN: 19 mg/dL (ref 6–20)
CALCIUM: 8.4 mg/dL — AB (ref 8.9–10.3)
CO2: 26 mmol/L (ref 22–32)
CREATININE: 0.78 mg/dL (ref 0.44–1.00)
Chloride: 98 mmol/L — ABNORMAL LOW (ref 101–111)
Glucose, Bld: 115 mg/dL — ABNORMAL HIGH (ref 65–99)
Potassium: 2.9 mmol/L — ABNORMAL LOW (ref 3.5–5.1)
Sodium: 130 mmol/L — ABNORMAL LOW (ref 135–145)
Total Bilirubin: 0.3 mg/dL (ref 0.3–1.2)
Total Protein: 7.5 g/dL (ref 6.5–8.1)

## 2016-07-12 LAB — CBC WITH DIFFERENTIAL/PLATELET
Basophils Absolute: 0 10*3/uL (ref 0.0–0.1)
Basophils Relative: 0 %
Eosinophils Absolute: 0.1 10*3/uL (ref 0.0–0.7)
Eosinophils Relative: 2 %
HEMATOCRIT: 41.7 % (ref 36.0–46.0)
HEMOGLOBIN: 14.4 g/dL (ref 12.0–15.0)
LYMPHS ABS: 1.7 10*3/uL (ref 0.7–4.0)
LYMPHS PCT: 23 %
MCH: 31 pg (ref 26.0–34.0)
MCHC: 34.5 g/dL (ref 30.0–36.0)
MCV: 89.9 fL (ref 78.0–100.0)
Monocytes Absolute: 0.3 10*3/uL (ref 0.1–1.0)
Monocytes Relative: 5 %
NEUTROS ABS: 5.1 10*3/uL (ref 1.7–7.7)
NEUTROS PCT: 70 %
Platelets: 188 10*3/uL (ref 150–400)
RBC: 4.64 MIL/uL (ref 3.87–5.11)
RDW: 12.6 % (ref 11.5–15.5)
WBC: 7.2 10*3/uL (ref 4.0–10.5)

## 2016-07-12 LAB — D-DIMER, QUANTITATIVE (NOT AT ARMC): D DIMER QUANT: 0.86 ug{FEU}/mL — AB (ref 0.00–0.50)

## 2016-07-12 LAB — URINE MICROSCOPIC-ADD ON

## 2016-07-12 LAB — TROPONIN I

## 2016-07-12 MED ORDER — SODIUM CHLORIDE 0.9 % IV BOLUS (SEPSIS)
1000.0000 mL | Freq: Once | INTRAVENOUS | Status: AC
  Administered 2016-07-12: 1000 mL via INTRAVENOUS

## 2016-07-12 MED ORDER — POTASSIUM CHLORIDE CRYS ER 20 MEQ PO TBCR
40.0000 meq | EXTENDED_RELEASE_TABLET | Freq: Once | ORAL | Status: AC
  Administered 2016-07-12: 40 meq via ORAL
  Filled 2016-07-12: qty 2

## 2016-07-12 MED ORDER — IOPAMIDOL (ISOVUE-370) INJECTION 76%
100.0000 mL | Freq: Once | INTRAVENOUS | Status: AC | PRN
  Administered 2016-07-12: 100 mL via INTRAVENOUS

## 2016-07-12 MED ORDER — AMLODIPINE BESYLATE 5 MG PO TABS: 5.0000 mg | ORAL_TABLET | Freq: Every day | ORAL | 0 refills | Status: DC

## 2016-07-12 MED ORDER — POTASSIUM CHLORIDE ER 10 MEQ PO TBCR: 10.0000 meq | EXTENDED_RELEASE_TABLET | Freq: Three times a day (TID) | ORAL | 0 refills | Status: DC

## 2016-07-12 NOTE — ED Triage Notes (Signed)
Pt reports right flank pain since last night, denies fever/n/v/d/urinary symptoms. Pt also denies injury.

## 2016-07-12 NOTE — ED Provider Notes (Signed)
[facility] DEPT Provider Note   CSN: [id number] Arrival date & time: 07/12/16  1158     History   Chief Complaint Chief Complaint  Patient presents with  . Flank Pain    HPI Lydia Moore is a 55 y.o. female.  Patient reports one week history of dry nonproductive cough. No runny nose or sore throat. No fever. No chest pain or shortness of breath. Last night she developed right sided low back and posterior rib pain. Denies any falls or trauma. Denies any association with coughing. No chest pain or shortness of breath. Pain is constant, partially relieved with ibuprofen. Denies any nausea, vomiting, diarrhea. No dysuria hematuria. No vaginal bleeding or discharge. No history of kidney stones. She is a smoker but no history of asthma or COPD. Denies radiation of pain.   The history is provided by the patient.  Flank Pain  Pertinent negatives include no chest pain, no abdominal pain, no headaches and no shortness of breath.    Past Medical History:  Diagnosis Date  . Elevated cholesterol   . GERD (gastroesophageal reflux disease)   . Hypertension     Patient Active Problem List   Diagnosis Date Noted  . Hematemesis 04/17/2012  . Melena 04/17/2012    Past Surgical History:  Procedure Laterality Date  . ABDOMINAL HYSTERECTOMY     partial  . CESAREAN SECTION    . TONSILLECTOMY      OB History    No data available       Home Medications    Prior to Admission medications   Medication Sig Start Date End Date Taking? Authorizing Provider  acetaminophen (TYLENOL) 500 MG tablet Take 1,000 mg by mouth every 6 (six) hours as needed. Pain    Historical Provider, MD  lisinopril-hydrochlorothiazide (PRINZIDE,ZESTORETIC) 20-12.5 MG per tablet Take 0.5 tablets by mouth daily.    Historical Provider, MD  omeprazole (PRILOSEC) 20 MG capsule Take 1 capsule (20 mg total) by mouth daily. 07/20/12 07/20/13  Annitta Needs, NP    Family History History reviewed. No pertinent family  history.  Social History Social History  Substance Use Topics  . Smoking status: Current Every Day Smoker    Packs/day: 1.00    Years: 35.00    Types: Cigarettes  . Smokeless tobacco: Not on file  . Alcohol use Yes     Comment: occasionally, Couple drinks per mo     Allergies   Review of patient's allergies indicates no known allergies.   Review of Systems Review of Systems  Constitutional: Negative for activity change, appetite change and fever.  HENT: Negative for congestion.   Respiratory: Positive for cough. Negative for chest tightness and shortness of breath.   Cardiovascular: Negative for chest pain and leg swelling.  Gastrointestinal: Negative for abdominal pain, nausea and vomiting.  Genitourinary: Positive for flank pain. Negative for dysuria, hematuria, vaginal bleeding and vaginal discharge.  Musculoskeletal: Positive for back pain. Negative for arthralgias.  Skin: Negative for pallor and wound.  Neurological: Negative for dizziness, weakness and headaches.  A complete 10 system review of systems was obtained and all systems are negative except as noted in the HPI and PMH.     Physical Exam Updated Vital Signs BP 185/96 (BP Location: Left Arm)   Pulse 79   Temp 98.1 F (36.7 C) (Oral)   Resp 16   Ht '5\' 5"'$  (1.651 m)   Wt 142 lb (64.4 kg)   SpO2 100%   BMI 23.63 kg/m  Physical Exam  Constitutional: She is oriented to person, place, and time. She appears well-developed and well-nourished. No distress.  HENT:  Head: Normocephalic and atraumatic.  Mouth/Throat: Oropharynx is clear and moist. No oropharyngeal exudate.  Eyes: Conjunctivae and EOM are normal. Pupils are equal, round, and reactive to light.  Neck: Normal range of motion. Neck supple.  No meningismus.  Cardiovascular: Normal rate, regular rhythm, normal heart sounds and intact distal pulses.   No murmur heard. Pulmonary/Chest: Effort normal and breath sounds normal. No respiratory distress.       TTP R lower posterior ribs. No crepitance, no rash.  Abdominal: Soft. There is no tenderness. There is no rebound and no guarding.  Musculoskeletal: Normal range of motion. She exhibits no edema or tenderness.  No CVAT  Neurological: She is alert and oriented to person, place, and time. No cranial nerve deficit. She exhibits normal muscle tone. Coordination normal.   5/5 strength throughout. CN 2-12 intact.Equal grip strength.   Skin: Skin is warm.  Psychiatric: She has a normal mood and affect. Her behavior is normal.  Nursing note and vitals reviewed.    ED Treatments / Results  Labs (all labs ordered are listed, but only abnormal results are displayed) Labs Reviewed  URINALYSIS, ROUTINE W REFLEX MICROSCOPIC (NOT AT [facility]) - Abnormal; Notable for the following:       Result Value   Specific Gravity, Urine >1.030 (*)    Ketones, ur TRACE (*)    Protein, ur TRACE (*)    All other components within normal limits  URINE MICROSCOPIC-ADD ON - Abnormal; Notable for the following:    Squamous Epithelial / LPF 6-30 (*)    Bacteria, UA FEW (*)    Crystals CA OXALATE CRYSTALS (*)    All other components within normal limits  COMPREHENSIVE METABOLIC PANEL - Abnormal; Notable for the following:    Sodium 130 (*)    Potassium 2.9 (*)    Chloride 98 (*)    Glucose, Bld 115 (*)    Calcium 8.4 (*)    All other components within normal limits  D-DIMER, QUANTITATIVE (NOT AT [facility]) - Abnormal; Notable for the following:    D-Dimer, Quant 0.86 (*)    All other components within normal limits  URINE CULTURE  CBC WITH DIFFERENTIAL/PLATELET  TROPONIN I    EKG  EKG Interpretation  Date/Time:  Monday July 12 2016 15:31:21 EDT Ventricular Rate:  69 PR Interval:    QRS Duration: 98 QT Interval:  446 QTC Calculation: 478 R Axis:   28 Text Interpretation:  Sinus rhythm Probable left atrial enlargement Baseline wander in lead(s) V4 No previous ECGs available Confirmed by Minerva  MD,  Andren Bethea ([id number]) on 07/12/2016 3:40:10 PM       Radiology Dg Chest 2 View  Result Date: 07/12/2016 CLINICAL DATA:  RIGHT posterior rib and flank pain, dry cough for 1 week, no known injury, history hypertension and smoking EXAM: CHEST  2 VIEW COMPARISON:  09/22/2006 FINDINGS: Upper normal heart size. Mild elongation of thoracic aorta. Enlargement of RIGHT paratracheal soft tissues and RIGHT hilum likely reflecting adenopathy though perihilar mass is not excluded. No acute infiltrate, pleural effusion or pneumothorax. Diffuse osseous demineralization. IMPRESSION: Suspected RIGHT hilar and RIGHT paratracheal adenopathy ; further assessment by CT chest with contrast recommended to exclude neoplasm. Findings called to Dr. Wyvonnia Dusky On 07/12/2016 at 1612 hours. Electronically Signed   By: Lavonia Dana M.D.   On: 07/12/2016 16:12   Ct Angio Chest Pe  W And/or Wo Contrast  Result Date: 07/12/2016 CLINICAL DATA:  Pleuritic chest and right flank pain since last night. Clinical concern for pulmonary embolism. Right paratracheal and right hilar masses on radiographs earlier today. Smoker. EXAM: CT ANGIOGRAPHY CHEST WITH CONTRAST TECHNIQUE: Multidetector CT imaging of the chest was performed using the standard protocol during bolus administration of intravenous contrast. Multiplanar CT image reconstructions and MIPs were obtained to evaluate the vascular anatomy. CONTRAST:  100 cc Isovue 370 COMPARISON:  Chest radiographs obtained earlier today. FINDINGS: Mediastinum/Lymph Nodes: Multiple enlarged mediastinal and right hilar lymph nodes. The hilar nodes are forming a confluent mass measuring 7.3 x 5.4 cm on image number 72 of series 5. These are encasing the central pulmonary arteries on the right, causing marked narrowing of the arteries. None are completely occluded and no pulmonary arterial filling defects are seen. The mass is also encasing the central bronchi on the right, causing narrowing diffuse central bronchial  narrowing. The right lower lobe bronchus appears completely occluded or almost completely occluded, with mild peripheral postobstructive changes. The confluent right hilar mass is contiguous with a markedly enlarged subcarinal node with a short axis diameter of 35 mm on image number 59 of series 5. This cannot be separated from the right hilar mass. A right lower paratracheal node has a short axis diameter of 34 mm on image number 43 of series 5. Lungs/Pleura: Inferior to the right hilum, a right lower lobe mass is demonstrated measuring 3.9 x 3.5 cm on image number 90 of series 7. There is patchy opacity in the adjacent right lower lobe more inferiorly. There is also linear atelectasis in the right lower lobe. No pleural fluid is seen. No nodules elsewhere in either lung. Mild bilateral bullous changes. Upper abdomen: Unremarkable. The included portions of the adrenal glands have normal appearances. The inferior adrenal glands are not included. Musculoskeletal: Thoracic spine degenerative changes. No evidence of bony metastatic disease. Review of the MIP images confirms the above findings. IMPRESSION: 1. 3.9 x 3.5 cm right lower lobe mass with imaging features most compatible with a primary lung carcinoma. 2. Marked metastatic right hilar and mediastinal adenopathy including a confluent mass of adenopathy in the right hilum, encasing and causing marked narrowing of the central pulmonary arteries on the right with a short segment of occlusion of the right lower lobe bronchus. 3. Mild postobstructive changes in the inferior, medial right lower lobe. 4. Mild changes of COPD. Electronically Signed   By: Claudie Revering M.D.   On: 07/12/2016 17:30    Procedures Procedures (including critical care time)  Medications Ordered in ED Medications - No data to display   Initial Impression / Assessment and Plan / ED Course  I have reviewed the triage vital signs and the nursing notes.  Pertinent labs & imaging results  that were available during my care of the patient were reviewed by me and considered in my medical decision making (see chart for details).  Clinical Course  R posterior rib pain since yesterday.  1 week of dry cough. NO CP or SOB.  UA without infection or hematuria. Doubt kidney stone. Labs show mild hyponatremia and hypokalemia. IV fluids and by mouth potassium given. D-dimer elevated CXR with hilar LAD.  CT findings concerning for new right lower lobe mass with mediastinal lymphadenopathy. Discussed with Dr. Whitney Muse of oncology who will call patient in the morning for an appointment. We'll arrange bronchoscopy with Dr. Luan Pulling and pulmonary per Dr. Whitney Muse.  Final Clinical Impressions(s) /  ED Diagnoses   Final diagnoses:  Right lower lobe lung mass  Flank pain    New Prescriptions New Prescriptions   No medications on file     Ezequiel Essex, MD 07/13/16 [id number]

## 2016-07-12 NOTE — ED Notes (Signed)
Patient with no complaints at this time. Respirations even and unlabored. Skin warm/dry. Discharge instructions reviewed with patient at this time. Patient given opportunity to voice concerns/ask questions. IV removed per policy and band-aid applied to site. Patient discharged at this time and left Emergency Department with steady gait.  

## 2016-07-12 NOTE — Discharge Instructions (Signed)
Follow-up with Dr. Whitney Muse tomorrow. She will arrange a follow-up with the lung doctor as well. Return to the ED if you develop chest pain, shortness of breath or any other symptoms.

## 2016-07-15 ENCOUNTER — Encounter (HOSPITAL_COMMUNITY): Payer: Self-pay | Admitting: *Deleted

## 2016-07-15 ENCOUNTER — Other Ambulatory Visit: Payer: Self-pay | Admitting: *Deleted

## 2016-07-15 ENCOUNTER — Institutional Professional Consult (permissible substitution) (INDEPENDENT_AMBULATORY_CARE_PROVIDER_SITE_OTHER): Payer: Managed Care, Other (non HMO) | Admitting: Cardiothoracic Surgery

## 2016-07-15 VITALS — BP 150/90 | HR 78 | Resp 20 | Ht 65.0 in | Wt 144.0 lb

## 2016-07-15 DIAGNOSIS — R918 Other nonspecific abnormal finding of lung field: Secondary | ICD-10-CM

## 2016-07-15 DIAGNOSIS — R590 Localized enlarged lymph nodes: Secondary | ICD-10-CM

## 2016-07-15 NOTE — Progress Notes (Signed)
[address]       [place] [address]             [phone]                    Nancey M Thelen Addis Medical Record #[id number] Date of Birth: [birth year]  Referring: Patrici Ranks, MD Primary Care: Glo Herring., MD  Chief Complaint:    Chief Complaint  Patient presents with  . Lung Mass    Surgical eval on right lower lobe mass,  CTA Chest 07/12/2016    History of Present Illness:    Lydia Moore 55 y.o. female is seen in the office for one week history of dry nonproductive cough. No runny nose or sore throat. No fever. No chest pain or shortness of breath. She   developed right sided low back and posterior rib pain. Denies any falls or trauma.  No chest pain or shortness of breath. No hemoptysis. No fever , chills or night sweets. No wt loss  She went to [facility] ER , chest xray and ct of chest done.  Current Activity/ Functional Status:  Patient is independent with mobility/ambulation, transfers, ADL's, IADL's.   Zubrod Score: At the time of surgery this patient's most appropriate activity status/level should be described as: '[x]'$     0    Normal activity, no symptoms '[]'$     1    Restricted in physical strenuous activity but ambulatory, able to do out light work '[]'$     2    Ambulatory and capable of self care, unable to do work activities, up and about               >50 % of waking hours                              '[]'$     3    Only limited self care, in bed greater than 50% of waking hours '[]'$     4    Completely disabled, no self care, confined to bed or chair '[]'$     5    Moribund   Past Medical History:  Diagnosis Date  . Elevated cholesterol   . GERD (gastroesophageal reflux disease)   . Hypertension   history of gi bleed while on  [place]/daily   Past Surgical History:  Procedure Laterality Date  . ABDOMINAL HYSTERECTOMY     partial  . CESAREAN SECTION    . COLONOSCOPY    . TONSILLECTOMY      Family History: mother died of 109  "heart disease", father alive with heart disease  Social History   Social History  . Marital status: Married    Spouse name: N/A  . Number of children: 4  . Years of education: N/A   Occupational History  . [employer]   Social History Main Topics  . Smoking status: Current Every Day Smoker    Packs/day: 1.00    Years: 35.00    Types: Cigarettes  . Smokeless tobacco: Never Used  . Alcohol use Yes     Comment: occasionally, Couple drinks per mo  . Drug use: No  . Sexual activity: Yes    Birth control/ protection: Surgical   Other Topics Concern  . Not on file   Social History Narrative   Lives w/ husband    History  Smoking Status  . Current  Every Day Smoker  . Packs/day: 1.00  . Years: 35.00  . Types: Cigarettes  Smokeless Tobacco  . Never Used    History  Alcohol Use  . Yes    Comment: occasionally, Couple drinks per mo     No Known Allergies  Current Outpatient Prescriptions  Medication Sig Dispense Refill  . amLODipine (NORVASC) 5 MG tablet Take 1 tablet (5 mg total) by mouth daily. 30 tablet 0  . ibuprofen (ADVIL,MOTRIN) 200 MG tablet Take 200-800 mg by mouth every 6 (six) hours as needed for mild pain or moderate pain.    . potassium chloride (K-DUR) 10 MEQ tablet Take 1 tablet (10 mEq total) by mouth 3 (three) times daily. 10 tablet 0   No current facility-administered medications for this visit.       Review of Systems:     Cardiac Review of Systems: Y or N  Chest Pain [  Flank pain right  ]  Resting SOB [ n  ] Exertional SOB  [n  ]  Orthopnea [  ]n   Pedal Edema [ n  ]    Palpitations [n  ] Syncope  [ n ]   Presyncope [ n  ]  General Review of Systems: [Y] = yes [  ]=no Constitional: recent weight change [ n ];  Wt loss over the last 3 months [   ] anorexia [  ]; fatigue [nn  ]; nausea [  ]; night sweats [  ]; fever [  ]; or chills [  ];          Dental: poor dentition[  ]; Last Dentist visit:   Eye : blurred vision [  ]; diplopia [    ]; vision changes [  ];  Amaurosis fugax[  ]; Resp: cough [  ];  wheezing[  ];  hemoptysis[  ]; shortness of breath[  ]; paroxysmal nocturnal dyspnea[  ]; dyspnea on exertion[  ]; or orthopnea[  ];  GI:  gallstones[  ], vomiting[  ];  dysphagia[  ]; melena[  ];  hematochezia [  ]; heartburn[  ];   Hx of  Colonoscopy[  ]; GU: kidney stones [  ]; hematuria[  ];   dysuria [  ];  nocturia[  ];  history of     obstruction [  ]; urinary frequency [  ]             Skin: rash, swelling[  ];, hair loss[  ];  peripheral edema[  ];  or itching[  ]; Musculosketetal: myalgias[  ];  joint swelling[  ];  joint erythema[  ];  joint pain[  ];  back pain[  ];  Heme/Lymph: bruising[  ];  bleeding[  ];  anemia[  ];  Neuro: TIA[  ];  headaches[  ];  stroke[n  ];  vertigo[  ];  seizures[ n ];   paresthesias[  ];  difficulty walking[  ];  Psych:depression[  ]; anxiety[  ];  Endocrine: diabetes[  ];  thyroid dysfunction[  ];  Immunizations: Flu up to date [n ]; Pneumococcal up to date [facility]  ];  Other:  Physical Exam: BP (!) 150/90   Pulse 78   Resp 20   Ht '5\' 5"'$  (1.651 m)   Wt 144 lb (65.3 kg)   SpO2 98% Comment: RA  BMI 23.96 kg/m   PHYSICAL EXAMINATION: General appearance: alert and cooperative Head: Normocephalic, without obvious abnormality, atraumatic Neck: no adenopathy, no carotid bruit, no JVD,  supple, symmetrical, trachea midline and thyroid not enlarged, symmetric, no tenderness/mass/nodules Lymph nodes: Cervical, supraclavicular, and axillary nodes normal. Resp: clear to auscultation bilaterally Back: symmetric, no curvature. ROM normal. No CVA tenderness. Cardio: regular rate and rhythm, S1, S2 normal, no murmur, click, rub or gallop GI: soft, non-tender; bowel sounds normal; no masses,  no organomegaly Extremities: extremities normal, atraumatic, no cyanosis or edema Neurologic: Grossly normal  Diagnostic Studies & Laboratory data:     Recent Radiology Findings:   Dg Chest 2  View  Result Date: 07/12/2016 CLINICAL DATA:  RIGHT posterior rib and flank pain, dry cough for 1 week, no known injury, history hypertension and smoking EXAM: CHEST  2 VIEW COMPARISON:  09/22/2006 FINDINGS: Upper normal heart size. Mild elongation of thoracic aorta. Enlargement of RIGHT paratracheal soft tissues and RIGHT hilum likely reflecting adenopathy though perihilar mass is not excluded. No acute infiltrate, pleural effusion or pneumothorax. Diffuse osseous demineralization. IMPRESSION: Suspected RIGHT hilar and RIGHT paratracheal adenopathy ; further assessment by CT chest with contrast recommended to exclude neoplasm. Findings called to Dr. Wyvonnia Dusky On 07/12/2016 at 1612 hours. Electronically Signed   By: Lavonia Dana M.D.   On: 07/12/2016 16:12   Ct Angio Chest Pe W And/or Wo Contrast  Result Date: 07/12/2016 CLINICAL DATA:  Pleuritic chest and right flank pain since last night. Clinical concern for pulmonary embolism. Right paratracheal and right hilar masses on radiographs earlier today. Smoker. EXAM: CT ANGIOGRAPHY CHEST WITH CONTRAST TECHNIQUE: Multidetector CT imaging of the chest was performed using the standard protocol during bolus administration of intravenous contrast. Multiplanar CT image reconstructions and MIPs were obtained to evaluate the vascular anatomy. CONTRAST:  100 cc Isovue 370 COMPARISON:  Chest radiographs obtained earlier today. FINDINGS: Mediastinum/Lymph Nodes: Multiple enlarged mediastinal and right hilar lymph nodes. The hilar nodes are forming a confluent mass measuring 7.3 x 5.4 cm on image number 72 of series 5. These are encasing the central pulmonary arteries on the right, causing marked narrowing of the arteries. None are completely occluded and no pulmonary arterial filling defects are seen. The mass is also encasing the central bronchi on the right, causing narrowing diffuse central bronchial narrowing. The right lower lobe bronchus appears completely occluded or  almost completely occluded, with mild peripheral postobstructive changes. The confluent right hilar mass is contiguous with a markedly enlarged subcarinal node with a short axis diameter of 35 mm on image number 59 of series 5. This cannot be separated from the right hilar mass. A right lower paratracheal node has a short axis diameter of 34 mm on image number 43 of series 5. Lungs/Pleura: Inferior to the right hilum, a right lower lobe mass is demonstrated measuring 3.9 x 3.5 cm on image number 90 of series 7. There is patchy opacity in the adjacent right lower lobe more inferiorly. There is also linear atelectasis in the right lower lobe. No pleural fluid is seen. No nodules elsewhere in either lung. Mild bilateral bullous changes. Upper abdomen: Unremarkable. The included portions of the adrenal glands have normal appearances. The inferior adrenal glands are not included. Musculoskeletal: Thoracic spine degenerative changes. No evidence of bony metastatic disease. Review of the MIP images confirms the above findings. IMPRESSION: 1. 3.9 x 3.5 cm right lower lobe mass with imaging features most compatible with a primary lung carcinoma. 2. Marked metastatic right hilar and mediastinal adenopathy including a confluent mass of adenopathy in the right hilum, encasing and causing marked narrowing of the central pulmonary arteries on the  right with a short segment of occlusion of the right lower lobe bronchus. 3. Mild postobstructive changes in the inferior, medial right lower lobe. 4. Mild changes of COPD. Electronically Signed   By: Claudie Revering M.D.   On: 07/12/2016 17:30     I have independently reviewed the above radiologic studies.  Recent Lab Findings: Lab Results  Component Value Date   WBC 7.2 07/12/2016   HGB 14.4 07/12/2016   HCT 41.7 07/12/2016   PLT 188 07/12/2016   GLUCOSE 115 (H) 07/12/2016   ALT 19 07/12/2016   AST 23 07/12/2016   NA 130 (L) 07/12/2016   K 2.9 (L) 07/12/2016   CL 98 (L)  07/12/2016   CREATININE 0.78 07/12/2016   BUN 19 07/12/2016   CO2 26 07/12/2016      Assessment / Plan:   Likely primary lung cancer, possibly small cell. I have reviewed the likely dx with patient and her daughter. I have reccommended we proceeded with bronchoscopy, EBUS possible mediastinoscopy quickly. She will need MRI of brain and PET scan      I  spent 40 minutes counseling the patient face to face and 50% or more the  time was spent in counseling and coordination of care. The total time spent in the appointment was 60 minutes.  Grace Isaac MD      [address] [place] [address] Office [phone]   Beeper [phone]  07/15/2016 6:06 PM

## 2016-07-16 ENCOUNTER — Ambulatory Visit (HOSPITAL_COMMUNITY)
Admission: RE | Admit: 2016-07-16 | Discharge: 2016-07-16 | Disposition: A | Discharge status: Home / Self Care | Payer: Managed Care, Other (non HMO) | Source: Ambulatory Visit | Attending: Cardiothoracic Surgery | Admitting: Cardiothoracic Surgery

## 2016-07-16 ENCOUNTER — Ambulatory Visit (HOSPITAL_COMMUNITY): Payer: Managed Care, Other (non HMO) | Admitting: Anesthesiology

## 2016-07-16 ENCOUNTER — Encounter (HOSPITAL_COMMUNITY): Payer: Self-pay | Admitting: Surgery

## 2016-07-16 ENCOUNTER — Encounter (HOSPITAL_COMMUNITY)
Admission: RE | Disposition: A | Discharge status: Home / Self Care | Payer: Self-pay | Source: Ambulatory Visit | Attending: Cardiothoracic Surgery

## 2016-07-16 ENCOUNTER — Ambulatory Visit (HOSPITAL_COMMUNITY): Payer: Managed Care, Other (non HMO)

## 2016-07-16 DIAGNOSIS — K219 Gastro-esophageal reflux disease without esophagitis: Secondary | ICD-10-CM | POA: Diagnosis not present

## 2016-07-16 DIAGNOSIS — F17210 Nicotine dependence, cigarettes, uncomplicated: Secondary | ICD-10-CM | POA: Insufficient documentation

## 2016-07-16 DIAGNOSIS — C3431 Malignant neoplasm of lower lobe, right bronchus or lung: Secondary | ICD-10-CM | POA: Insufficient documentation

## 2016-07-16 DIAGNOSIS — R222 Localized swelling, mass and lump, trunk: Secondary | ICD-10-CM | POA: Diagnosis not present

## 2016-07-16 DIAGNOSIS — I10 Essential (primary) hypertension: Secondary | ICD-10-CM | POA: Insufficient documentation

## 2016-07-16 DIAGNOSIS — R590 Localized enlarged lymph nodes: Secondary | ICD-10-CM

## 2016-07-16 DIAGNOSIS — R918 Other nonspecific abnormal finding of lung field: Secondary | ICD-10-CM

## 2016-07-16 HISTORY — PX: VIDEO BRONCHOSCOPY WITH ENDOBRONCHIAL ULTRASOUND: SHX6177

## 2016-07-16 LAB — COMPREHENSIVE METABOLIC PANEL
ALT: 17 U/L (ref 14–54)
AST: 20 U/L (ref 15–41)
Albumin: 4.1 g/dL (ref 3.5–5.0)
Alkaline Phosphatase: 91 U/L (ref 38–126)
Anion gap: 11 (ref 5–15)
BUN: 19 mg/dL (ref 6–20)
CO2: 24 mmol/L (ref 22–32)
Calcium: 9.5 mg/dL (ref 8.9–10.3)
Chloride: 100 mmol/L — ABNORMAL LOW (ref 101–111)
Creatinine, Ser: 0.72 mg/dL (ref 0.44–1.00)
GFR calc Af Amer: >60 mL/min (ref >60)
GFR calc non Af Amer: >60 mL/min (ref >60)
Glucose, Bld: 98 mg/dL (ref 65–99)
Potassium: 4.3 mmol/L (ref 3.5–5.1)
Sodium: 135 mmol/L (ref 135–145)
Total Bilirubin: 1 mg/dL (ref 0.3–1.2)
Total Protein: 6.8 g/dL (ref 6.5–8.1)

## 2016-07-16 LAB — PROTIME-INR
INR: 1.01
Prothrombin Time: 13.3 seconds (ref 11.4–15.2)

## 2016-07-16 LAB — CBC
HCT: 42.3 % (ref 36.0–46.0)
Hemoglobin: 14.1 g/dL (ref 12.0–15.0)
MCH: 30.1 pg (ref 26.0–34.0)
MCHC: 33.3 g/dL (ref 30.0–36.0)
MCV: 90.4 fL (ref 78.0–100.0)
Platelets: 197 10*3/uL (ref 150–400)
RBC: 4.68 MIL/uL (ref 3.87–5.11)
RDW: 12.6 % (ref 11.5–15.5)
WBC: 6.2 10*3/uL (ref 4.0–10.5)

## 2016-07-16 LAB — TYPE AND SCREEN
ABO/RH(D): O NEG
Antibody Screen: NEGATIVE

## 2016-07-16 LAB — APTT: aPTT: 35 seconds (ref 24–36)

## 2016-07-16 LAB — ABO/RH: ABO/RH(D): O NEG

## 2016-07-16 LAB — SURGICAL PCR SCREEN
MRSA, PCR: NEGATIVE
Staphylococcus aureus: NEGATIVE

## 2016-07-16 SURGERY — BRONCHOSCOPY, WITH EBUS
Anesthesia: General

## 2016-07-16 MED ORDER — MEPERIDINE HCL 25 MG/ML IJ SOLN: 6.2500 mg | INTRAMUSCULAR | Status: DC | PRN

## 2016-07-16 MED ORDER — DEXTROSE 5 % IV SOLN
INTRAVENOUS | Status: AC
  Filled 2016-07-16: qty 1.5

## 2016-07-16 MED ORDER — SUGAMMADEX SODIUM 200 MG/2ML IV SOLN
INTRAVENOUS | Status: DC | PRN
  Administered 2016-07-16: 180 mg via INTRAVENOUS

## 2016-07-16 MED ORDER — LACTATED RINGERS IV SOLN
INTRAVENOUS | Status: DC | PRN
  Administered 2016-07-16 (×2): via INTRAVENOUS

## 2016-07-16 MED ORDER — FENTANYL CITRATE (PF) 100 MCG/2ML IJ SOLN: 25.0000 ug | INTRAMUSCULAR | Status: DC | PRN

## 2016-07-16 MED ORDER — DEXTROSE 5 % IV SOLN
1.5000 g | INTRAVENOUS | Status: AC
  Administered 2016-07-16: 1.5 g via INTRAVENOUS

## 2016-07-16 MED ORDER — LACTATED RINGERS IV SOLN: INTRAVENOUS | Status: DC

## 2016-07-16 MED ORDER — MIDAZOLAM HCL 5 MG/5ML IJ SOLN
INTRAMUSCULAR | Status: DC | PRN
  Administered 2016-07-16: 2 mg via INTRAVENOUS

## 2016-07-16 MED ORDER — ONDANSETRON HCL 4 MG/2ML IJ SOLN
INTRAMUSCULAR | Status: DC | PRN
  Administered 2016-07-16: 4 mg via INTRAVENOUS

## 2016-07-16 MED ORDER — FENTANYL CITRATE (PF) 250 MCG/5ML IJ SOLN
INTRAMUSCULAR | Status: AC
  Filled 2016-07-16: qty 5

## 2016-07-16 MED ORDER — EPHEDRINE SULFATE 50 MG/ML IJ SOLN
INTRAMUSCULAR | Status: DC | PRN
  Administered 2016-07-16: 15 mg via INTRAVENOUS
  Administered 2016-07-16: 10 mg via INTRAVENOUS

## 2016-07-16 MED ORDER — ROCURONIUM BROMIDE 10 MG/ML (PF) SYRINGE
PREFILLED_SYRINGE | INTRAVENOUS | Status: AC
  Filled 2016-07-16: qty 10

## 2016-07-16 MED ORDER — PROPOFOL 10 MG/ML IV BOLUS
INTRAVENOUS | Status: AC
  Filled 2016-07-16: qty 20

## 2016-07-16 MED ORDER — ROCURONIUM BROMIDE 100 MG/10ML IV SOLN
INTRAVENOUS | Status: DC | PRN
  Administered 2016-07-16: 40 mg via INTRAVENOUS

## 2016-07-16 MED ORDER — FENTANYL CITRATE (PF) 100 MCG/2ML IJ SOLN
INTRAMUSCULAR | Status: DC | PRN
  Administered 2016-07-16 (×3): 50 ug via INTRAVENOUS

## 2016-07-16 MED ORDER — MUPIROCIN 2 % EX OINT
1.0000 "application " | TOPICAL_OINTMENT | Freq: Once | CUTANEOUS | Status: AC
  Administered 2016-07-16: 1 via TOPICAL
  Filled 2016-07-16: qty 22

## 2016-07-16 MED ORDER — MIDAZOLAM HCL 2 MG/2ML IJ SOLN
INTRAMUSCULAR | Status: AC
  Filled 2016-07-16: qty 2

## 2016-07-16 MED ORDER — EPINEPHRINE HCL 1 MG/ML IJ SOLN
INTRAMUSCULAR | Status: AC
  Filled 2016-07-16: qty 1

## 2016-07-16 MED ORDER — SUGAMMADEX SODIUM 200 MG/2ML IV SOLN
INTRAVENOUS | Status: AC
  Filled 2016-07-16: qty 2

## 2016-07-16 MED ORDER — METOCLOPRAMIDE HCL 5 MG/ML IJ SOLN: 10.0000 mg | Freq: Once | INTRAMUSCULAR | Status: DC | PRN

## 2016-07-16 MED ORDER — PROPOFOL 10 MG/ML IV BOLUS
INTRAVENOUS | Status: DC | PRN
  Administered 2016-07-16: 20 mg via INTRAVENOUS
  Administered 2016-07-16: 100 mg via INTRAVENOUS
  Administered 2016-07-16: 20 mg via INTRAVENOUS

## 2016-07-16 MED ORDER — ONDANSETRON HCL 4 MG/2ML IJ SOLN
INTRAMUSCULAR | Status: AC
  Filled 2016-07-16: qty 2

## 2016-07-16 MED ORDER — LIDOCAINE 2% (20 MG/ML) 5 ML SYRINGE
INTRAMUSCULAR | Status: AC
  Filled 2016-07-16: qty 5

## 2016-07-16 MED ORDER — LIDOCAINE HCL (CARDIAC) 20 MG/ML IV SOLN
INTRAVENOUS | Status: DC | PRN
  Administered 2016-07-16: 60 mg via INTRAVENOUS

## 2016-07-16 MED ORDER — LIDOCAINE HCL 1 % IJ SOLN
INTRAMUSCULAR | Status: DC | PRN
  Administered 2016-07-16: 1 mL

## 2016-07-16 MED ORDER — 0.9 % SODIUM CHLORIDE (POUR BTL) OPTIME
TOPICAL | Status: DC | PRN
  Administered 2016-07-16: 1000 mL

## 2016-07-16 SURGICAL SUPPLY — 52 items
BLADE SURG 10 STRL SS (BLADE) ×2 IMPLANT
BRUSH CYTOL CELLEBRITY 1.5X140 (MISCELLANEOUS) IMPLANT
CANISTER SUCTION 2500CC (MISCELLANEOUS) ×4 IMPLANT
CLIP TI MEDIUM 6 (CLIP) IMPLANT
CONT SPEC 4OZ CLIKSEAL STRL BL (MISCELLANEOUS) ×4 IMPLANT
COVER BACK TABLE 60X90IN (DRAPES) ×2 IMPLANT
COVER DOME SNAP 22 D (MISCELLANEOUS) ×2 IMPLANT
COVER SURGICAL LIGHT HANDLE (MISCELLANEOUS) ×4 IMPLANT
COVER TABLE BACK 60X90 (DRAPES) ×2 IMPLANT
DERMABOND ADVANCED (GAUZE/BANDAGES/DRESSINGS) ×1
DERMABOND ADVANCED .7 DNX12 (GAUZE/BANDAGES/DRESSINGS) ×1 IMPLANT
DRAPE LAPAROTOMY T 102X78X121 (DRAPES) ×2 IMPLANT
DRSG AQUACEL AG ADV 3.5X14 (GAUZE/BANDAGES/DRESSINGS) ×2 IMPLANT
ELECT CAUTERY BLADE 6.4 (BLADE) ×2 IMPLANT
ELECT REM PT RETURN 9FT ADLT (ELECTROSURGICAL) ×2
ELECTRODE REM PT RTRN 9FT ADLT (ELECTROSURGICAL) ×1 IMPLANT
FORCEPS BIOP RJ4 1.8 (CUTTING FORCEPS) IMPLANT
FORCEPS RADIAL JAW LRG 4 PULM (INSTRUMENTS) ×1 IMPLANT
GAUZE SPONGE 4X4 12PLY STRL (GAUZE/BANDAGES/DRESSINGS) ×4 IMPLANT
GAUZE SPONGE 4X4 16PLY XRAY LF (GAUZE/BANDAGES/DRESSINGS) ×2 IMPLANT
GLOVE BIO SURGEON STRL SZ 6.5 (GLOVE) ×4 IMPLANT
GOWN STRL REUS W/ TWL LRG LVL3 (GOWN DISPOSABLE) ×1 IMPLANT
GOWN STRL REUS W/TWL LRG LVL3 (GOWN DISPOSABLE) ×1
HEMOSTAT SURGICEL 2X14 (HEMOSTASIS) IMPLANT
KIT BASIN OR (CUSTOM PROCEDURE TRAY) ×2 IMPLANT
KIT CLEAN ENDO COMPLIANCE (KITS) ×6 IMPLANT
KIT ROOM TURNOVER OR (KITS) ×4 IMPLANT
MARKER SKIN DUAL TIP RULER LAB (MISCELLANEOUS) ×2 IMPLANT
NEEDLE BIOPSY TRANSBRONCH 21G (NEEDLE) IMPLANT
NEEDLE BLUNT 18X1 FOR OR ONLY (NEEDLE) IMPLANT
NEEDLE EBUS SONO TIP PENTAX (NEEDLE) ×2 IMPLANT
NS IRRIG 1000ML POUR BTL (IV SOLUTION) ×4 IMPLANT
OIL SILICONE PENTAX (PARTS (SERVICE/REPAIRS)) ×2 IMPLANT
PACK SURGICAL SETUP 50X90 (CUSTOM PROCEDURE TRAY) ×2 IMPLANT
PAD ARMBOARD 7.5X6 YLW CONV (MISCELLANEOUS) ×8 IMPLANT
PENCIL BUTTON HOLSTER BLD 10FT (ELECTRODE) ×2 IMPLANT
RADIAL JAW LRG 4 PULMONARY (INSTRUMENTS) ×1
SPONGE INTESTINAL PEANUT (DISPOSABLE) IMPLANT
STAPLER VISISTAT 35W (STAPLE) IMPLANT
SUT VIC AB 3-0 SH 18 (SUTURE) ×2 IMPLANT
SUT VICRYL 4-0 PS2 18IN ABS (SUTURE) ×2 IMPLANT
SWAB COLLECTION DEVICE MRSA (MISCELLANEOUS) IMPLANT
SYR 20CC LL (SYRINGE) ×2 IMPLANT
SYR 20ML ECCENTRIC (SYRINGE) ×2 IMPLANT
SYRINGE 10CC LL (SYRINGE) ×2 IMPLANT
TOWEL OR 17X24 6PK STRL BLUE (TOWEL DISPOSABLE) ×4 IMPLANT
TOWEL OR 17X26 10 PK STRL BLUE (TOWEL DISPOSABLE) ×2 IMPLANT
TRAP SPECIMEN MUCOUS 40CC (MISCELLANEOUS) ×2 IMPLANT
TUBE ANAEROBIC SPECIMEN COL (MISCELLANEOUS) IMPLANT
TUBE CONNECTING 12X1/4 (SUCTIONS) ×2 IMPLANT
TUBE CONNECTING 20X1/4 (TUBING) ×2 IMPLANT
WATER STERILE IRR 1000ML POUR (IV SOLUTION) ×4 IMPLANT

## 2016-07-16 NOTE — Progress Notes (Signed)
[facility]  CONSULT NOTE  Patient Care Team: Redmond School, MD as PCP - General (Internal Medicine) Danie Binder, MD (Gastroenterology)  CHIEF COMPLAINTS/PURPOSE OF CONSULTATION:  Small cell lung cancer. Stage pending.  HISTORY OF PRESENTING ILLNESS:  Lydia Moore 55 y.o. female is here because of newly diagnosed SCLC.   Lydia Moore is accompanied by her daughter.   She reports shortness of breath sometimes and persistent dry cough. This dry cough does not keep her up at night. When asked if she was experiencing pain, patient states, "Nothing major".   She reports a sinus headache on the right side for the last 2 or 3 weeks. This is not everyday.   Reports her appetite "has been a little slack" but attributes this to nerves. She was eating fine before all of this. She admits to losing about 2 to 3 lbs in the last couple weeks but again attributes it to nerves.   She presented to the ED on 8/28 secondary to persistent cough and R sided low back and posterior rib pain. CTA of the chest was performed with shoed a new RLL mass with mediastinal LAD. Labs showed a sodium of 130.  Dr. Servando Snare was able to quickly accommodate the patient given the concern for potential SCLC and she underwent endoscopic bronchoscopy with transbronchial biopsy of #7 nodes and biopsy of RLL lung mass on 07/16/2016. Pathology confirmed SCLC.   She denies vomiting. She has experienced a little nausea in the mornings. She was given Zofran by her physician.  She smokes 1 ppd. She would like to quit, "But right now is just not the time".  She continues to work full time. Performance status is excellent.   She has not received a mammogram in a few years.   Staging is not complete, MRI had been scheduled in [place] on Friday and she wonders if this can be moved to [facility]. PET is scheduled on Monday at [facility].  MEDICAL HISTORY:  Past Medical History:  Diagnosis Date  . Elevated cholesterol   . GERD  (gastroesophageal reflux disease)   . GI bleed   . Hypertension     SURGICAL HISTORY: Past Surgical History:  Procedure Laterality Date  . ABDOMINAL HYSTERECTOMY     partial  . CESAREAN SECTION    . COLONOSCOPY    . TONSILLECTOMY    . VIDEO BRONCHOSCOPY WITH ENDOBRONCHIAL ULTRASOUND N/A 07/16/2016   Procedure: VIDEO BRONCHOSCOPY WITH ENDOBRONCHIAL ULTRASOUND with ultrasound transbronchial biopsy of node #7 and right lower lobe lesion;  Surgeon: Grace Isaac, MD;  Location: [facility] OR;  Service: Thoracic;  Laterality: N/A;    SOCIAL HISTORY: Social History   Social History  . Marital status: Married    Spouse name: N/A  . Number of children: 4  . Years of education: N/A   Occupational History  . [employer]   Social History Main Topics  . Smoking status: Current Every Day Smoker    Packs/day: 1.00    Years: 35.00    Types: Cigarettes  . Smokeless tobacco: Never Used  . Alcohol use Yes     Comment: occasionally, Couple drinks per mo  . Drug use:     Frequency: 1.0 time per week    Types: Marijuana     Comment: weekly  . Sexual activity: Yes    Birth control/ protection: Surgical     Comment: married   Other Topics Concern  . Not on file   Social  History Narrative   Lives w/ husband   Married 36 years 4 children 10 grandchildren Works as a Metallurgist, 1 ppd. She wants to quit but now is not the time. ETOH, none.  FAMILY HISTORY: Family History  Problem Relation Age of Onset  . Heart disease Mother   . Heart disease Father   . COPD Sister   . HIV Brother    Mother deceased at 78 yo of heart disease Father still living at 22 yo 2 sisters living. 1 brother deceased of HIV at 74 yo  ALLERGIES:  has No Known Allergies.  MEDICATIONS:  Current Outpatient Prescriptions  Medication Sig Dispense Refill  . amLODipine (NORVASC) 5 MG tablet Take 1 tablet (5 mg total) by mouth daily. 30 tablet 0  . ibuprofen (ADVIL,MOTRIN) 200 MG tablet Take  200-800 mg by mouth every 6 (six) hours as needed for mild pain or moderate pain.    . potassium chloride (K-DUR) 10 MEQ tablet Take 1 tablet (10 mEq total) by mouth 3 (three) times daily. 10 tablet 0   No current facility-administered medications for this visit.     Review of Systems  Constitutional: Positive for weight loss.       Weight loss of 2 to 3 lbs in the last couple of weeks. Decreased appetite in the last couple of weeks.  Eyes: Negative.   Respiratory: Positive for cough and shortness of breath.        Intermittent shortness of breath Persistent dry cough  Cardiovascular: Negative.  Negative for leg swelling.  Gastrointestinal: Positive for nausea. Negative for vomiting.       Nausea in the mornings  Genitourinary: Negative.   Musculoskeletal: Negative.        Leg cramping at night  Skin: Negative.   Neurological: Positive for headaches.       Right sided sinus headaches over the last 2 to 3 weeks, though not everyday.  Endo/Heme/Allergies: Negative.   Psychiatric/Behavioral: Negative.   All other systems reviewed and are negative.  14 point ROS was done and is otherwise as detailed above or in HPI   PHYSICAL EXAMINATION: ECOG PERFORMANCE STATUS: 1 - Symptomatic but completely ambulatory  Vitals:   07/21/16 0754  BP: (!) 128/98  Pulse: 76  Resp: 16  Temp: 98.6 F (37 C)   Filed Weights   07/21/16 0754  Weight: 142 lb 1.6 oz (64.5 kg)    Physical Exam  Constitutional: She is oriented to person, place, and time and well-developed, well-nourished, and in no distress.  HENT:  Head: Normocephalic and atraumatic.  Nose: Nose normal.  Mouth/Throat: Oropharynx is clear and moist. No oropharyngeal exudate.  Eyes: Conjunctivae and EOM are normal. Pupils are equal, round, and reactive to light. Right eye exhibits no discharge. Left eye exhibits no discharge. No scleral icterus.  Neck: Normal range of motion. Neck supple. No tracheal deviation present. No  thyromegaly present.  Cardiovascular: Normal rate, regular rhythm and normal heart sounds.  Exam reveals no gallop and no friction rub.   No murmur heard. Pulmonary/Chest: Effort normal. She has no wheezes. She has no rales.  Decreased BS R  Abdominal: Soft. Bowel sounds are normal. She exhibits no distension and no mass. There is no tenderness. There is no rebound and no guarding.  Musculoskeletal: Normal range of motion. She exhibits no edema.  Lymphadenopathy:    She has no cervical adenopathy.  Neurological: She is alert and oriented to person, place, and time. She has normal  reflexes. No cranial nerve deficit. Gait normal. Coordination normal.  Skin: Skin is warm and dry. No rash noted.  Psychiatric: Mood, memory, affect and judgment normal.  Nursing note and vitals reviewed.   LABORATORY DATA:  I have reviewed the data as listed Lab Results  Component Value Date   WBC 6.2 07/16/2016   HGB 14.1 07/16/2016   HCT 42.3 07/16/2016   MCV 90.4 07/16/2016   PLT 197 07/16/2016   CMP     Component Value Date/Time   NA 135 07/16/2016 0558   K 4.3 07/16/2016 0558   CL 100 (L) 07/16/2016 0558   CO2 24 07/16/2016 0558   GLUCOSE 98 07/16/2016 0558   BUN 19 07/16/2016 0558   CREATININE 0.72 07/16/2016 0558   CALCIUM 9.5 07/16/2016 0558   PROT 6.8 07/16/2016 0558   ALBUMIN 4.1 07/16/2016 0558   AST 20 07/16/2016 0558   ALT 17 07/16/2016 0558   ALKPHOS 91 07/16/2016 0558   BILITOT 1.0 07/16/2016 0558   GFRNONAA >60 07/16/2016 0558   GFRAA >60 07/16/2016 0558     RADIOGRAPHIC STUDIES: I have personally reviewed the radiological images as listed and agreed with the findings in the report. No results found.  Study Result   CLINICAL DATA:  Lung mass.  EXAM: CHEST  2 VIEW  COMPARISON:  07/12/2016  FINDINGS: Right lower lobe mass and right mediastinal/ hilar adenopathy are again evident without interval change. No superimposed acute cardiopulmonary process. Pulmonary  vasculature is normal. No pleural effusions.  IMPRESSION: Right lower lobe mass with hilar and mediastinal adenopathy.   Electronically Signed   By: Andreas Newport M.D.   On: 07/16/2016 06:52     PATHOLOGY     ASSESSMENT & PLAN:   Small cell lung cancer, staging pending. Headache Cough  The patient is here for further evaluation and discussion of newly diagnosed small cell lung cancer. I spoke with the patient about limited stage versus extensive stage disease and plans for treatment. We discussed SCLC in detail. They were provided with reading information about SCLC.  She met with our patient navigator, Anderson Malta NP.  She will be set up for a picc line for her first cycle and port placement before her second cycle. Goal is to initiate therapy as quickly as possible ideally within a week of diagnosis.   She is scheduled for a Brain MRI on 07/23/2016 and a PET on 07/26/2016. I have moved her MRI to [facility] today at 6 pm. She is agreeable . It is much easier for her to stay local.   If she is found to have limited stage small cell lung cancer, I will make a referral for radiation consult during chemotherapy and we discussed that it would change only drug choice between cisplatin vs. Carboplatin.   She will be scheduled for chemotherapy teaching. We will plan to begin chemotherapy treatment next Tuesday, 07/27/2016. She will undergo CBC and CMET that day as well. I advised her that formal staging will be available post PET and MRI. We will discuss at follow-up.    ORDERS PLACED FOR THIS ENCOUNTER: Orders Placed This Encounter  Procedures  . Ambulatory referral to Social Work    All questions were answered. The patient knows to call the clinic with any problems, questions or concerns.  This document serves as a record of services personally performed by Ancil Linsey, MD. It was created on her behalf by Arlyce Harman, a trained medical scribe. The creation of this record is  based on the scribe's personal observations and the provider's statements to them. This document has been checked and approved by the attending provider.  I have reviewed the above documentation for accuracy and completeness and I agree with the above.  This note was electronically signed.    Molli Hazard, MD  07/21/2016 8:18 AM

## 2016-07-16 NOTE — Brief Op Note (Signed)
      [address]       [place], [address]             [phone]    07/16/2016  8:56 AM  PATIENT:  Lydia Moore  55 y.o. female  PRE-OPERATIVE DIAGNOSIS:  LUNG MASS MEDIASTINAL ADENOPATHY  POST-OPERATIVE DIAGNOSIS:  LUNG MASS MEDIASTINAL ADENOPATHY- small cell by quick smear   PROCEDURE:  Procedure(s): VIDEO BRONCHOSCOPY WITH ENDOBRONCHIAL ULTRASOUND (N/A) Transbronchial biopsy of # 7 nodes and right lower lobe bronchus   SURGEON:  Surgeon(s) and Role:    * Grace Isaac, MD - Primary  ANESTHESIA:   general  EBL:  No intake/output data recorded.  BLOOD ADMINISTERED:none  DRAINS: none   LOCAL MEDICATIONS USED:  NONE  SPECIMEN:  Source of Specimen:  # 7 node and right lower lobe bronchus  DISPOSITION OF SPECIMEN:  PATHOLOGY  COUNTS:  YES   DICTATION: .Other Dictation: Dictation Number -  PLAN OF CARE: Discharge to home after PACU  PATIENT DISPOSITION:  PACU - hemodynamically stable.   Delay start of Pharmacological VTE agent (>24hrs) due to surgical blood loss or risk of bleeding: yes Photos in epic media tab

## 2016-07-16 NOTE — Anesthesia Procedure Notes (Signed)
Procedure Name: Intubation Date/Time: 07/16/2016 7:50 AM Performed by: Lance Coon Pre-anesthesia Checklist: Patient identified, Emergency Drugs available, Suction available, Patient being monitored and Timeout performed Patient Re-evaluated:Patient Re-evaluated prior to inductionOxygen Delivery Method: Circle system utilized Preoxygenation: Pre-oxygenation with 100% oxygen Intubation Type: IV induction Ventilation: Mask ventilation without difficulty Laryngoscope Size: Miller and 2 Grade View: Grade I Tube type: Oral Tube size: 8.5 mm Number of attempts: 1 Airway Equipment and Method: Stylet Placement Confirmation: ETT inserted through vocal cords under direct vision,  breath sounds checked- equal and bilateral and positive ETCO2 Secured at: 21 cm Tube secured with: Tape Dental Injury: Teeth and Oropharynx as per pre-operative assessment

## 2016-07-16 NOTE — H&P (Signed)
[address]       [place] [address]             [phone]                    Glennette M Tilly Nettleton Medical Record #[id number] Date of Birth: August 26, 1961  Referring: Dr  Whitney Muse Primary Care: Glo Herring., MD  Chief Complaint:    Lung Mass    History of Present Illness:    KATRENIA ALKINS 55 y.o. female is seen in the office for one week history of dry nonproductive cough. No runny nose or sore throat. No fever. No chest pain or shortness of breath. She   developed right sided low back and posterior rib pain. Denies any falls or trauma.  No chest pain or shortness of breath. No hemoptysis. No fever , chills or night sweets. No wt loss  She went to [facility] ER , chest xray and ct of chest done.  Current Activity/ Functional Status:  Patient is independent with mobility/ambulation, transfers, ADL's, IADL's.   Zubrod Score: At the time of surgery this patient's most appropriate activity status/level should be described as: '[x]'$     0    Normal activity, no symptoms '[]'$     1    Restricted in physical strenuous activity but ambulatory, able to do out light work '[]'$     2    Ambulatory and capable of self care, unable to do work activities, up and about               >50 % of waking hours                              '[]'$     3    Only limited self care, in bed greater than 50% of waking hours '[]'$     4    Completely disabled, no self care, confined to bed or chair '[]'$     5    Moribund   Past Medical History:  Diagnosis Date  . Elevated cholesterol   . GERD (gastroesophageal reflux disease)   . Hypertension   history of gi bleed while on  [place]/daily   Past Surgical History:  Procedure Laterality Date  . ABDOMINAL HYSTERECTOMY     partial  . CESAREAN SECTION    . COLONOSCOPY    . TONSILLECTOMY      Family History: mother died of 65 "heart disease", father alive with heart disease  Social History   Social History  . Marital status: Married   Spouse name: N/A  . Number of children: 4  . Years of education: N/A   Occupational History  . [employer]   Social History Main Topics  . Smoking status: Current Every Day Smoker    Packs/day: 1.00    Years: 35.00    Types: Cigarettes  . Smokeless tobacco: Never Used  . Alcohol use Yes     Comment: occasionally, Couple drinks per mo  . Drug use: No  . Sexual activity: Yes    Birth control/ protection: Surgical   Other Topics Concern  . Not on file   Social History Narrative   Lives w/ husband    History  Smoking Status  . Current Every Day Smoker  . Packs/day: 1.00  . Years: 35.00  . Types: Cigarettes  Smokeless Tobacco  . Never Used  History  Alcohol Use  . Yes    Comment: occasionally, Couple drinks per mo     No Known Allergies  Current Facility-Administered Medications  Medication Dose Route Frequency Provider Last Rate Last Dose  . cefUROXime (ZINACEF) 1.5 g in dextrose 5 % 50 mL IVPB  1.5 g Intravenous 60 min Pre-Op Grace Isaac, MD      . dextrose 5 % with cefUROXime (ZINACEF) ADS Med               Review of Systems:     Cardiac Review of Systems: Y or N  Chest Pain [  Flank pain right  ]  Resting SOB [ n  ] Exertional SOB  [n  ]  Orthopnea [  ]n   Pedal Edema [ n  ]    Palpitations [n  ] Syncope  [ n ]   Presyncope [ n  ]  General Review of Systems: [Y] = yes [  ]=no Constitional: recent weight change [ n ];  Wt loss over the last 3 months [   ] anorexia [  ]; fatigue [nn  ]; nausea [  ]; night sweats [  ]; fever [  ]; or chills [  ];          Dental: poor dentition[  ]; Last Dentist visit:   Eye : blurred vision [  ]; diplopia [   ]; vision changes [  ];  Amaurosis fugax[  ]; Resp: cough [  ];  wheezing[  ];  hemoptysis[  ]; shortness of breath[  ]; paroxysmal nocturnal dyspnea[  ]; dyspnea on exertion[  ]; or orthopnea[  ];  GI:  gallstones[  ], vomiting[  ];  dysphagia[  ]; melena[  ];  hematochezia [  ]; heartburn[  ];   Hx of   Colonoscopy[  ]; GU: kidney stones [  ]; hematuria[  ];   dysuria [  ];  nocturia[  ];  history of     obstruction [  ]; urinary frequency [  ]             Skin: rash, swelling[  ];, hair loss[  ];  peripheral edema[  ];  or itching[  ]; Musculosketetal: myalgias[  ];  joint swelling[  ];  joint erythema[  ];  joint pain[  ];  back pain[  ];  Heme/Lymph: bruising[  ];  bleeding[  ];  anemia[  ];  Neuro: TIA[  ];  headaches[  ];  stroke[n  ];  vertigo[  ];  seizures[ n ];   paresthesias[  ];  difficulty walking[  ];  Psych:depression[  ]; anxiety[  ];  Endocrine: diabetes[  ];  thyroid dysfunction[  ];  Immunizations: Flu up to date [n ]; Pneumococcal up to date Florencio.Farrier  ];  Other:  Physical Exam: BP 123/82   Pulse 62   Temp 98.3 F (36.8 C) (Oral)   Resp 20   SpO2 93%   PHYSICAL EXAMINATION: General appearance: alert and cooperative Head: Normocephalic, without obvious abnormality, atraumatic Neck: no adenopathy, no carotid bruit, no JVD, supple, symmetrical, trachea midline and thyroid not enlarged, symmetric, no tenderness/mass/nodules Lymph nodes: Cervical, supraclavicular, and axillary nodes normal. Resp: clear to auscultation bilaterally Back: symmetric, no curvature. ROM normal. No CVA tenderness. Cardio: regular rate and rhythm, S1, S2 normal, no murmur, click, rub or gallop GI: soft, non-tender; bowel sounds normal; no masses,  no organomegaly Extremities: extremities normal, atraumatic, no cyanosis  or edema Neurologic: Grossly normal  Diagnostic Studies & Laboratory data:     Recent Radiology Findings:   Dg Chest 2 View  Result Date: 07/12/2016 CLINICAL DATA:  RIGHT posterior rib and flank pain, dry cough for 1 week, no known injury, history hypertension and smoking EXAM: CHEST  2 VIEW COMPARISON:  09/22/2006 FINDINGS: Upper normal heart size. Mild elongation of thoracic aorta. Enlargement of RIGHT paratracheal soft tissues and RIGHT hilum likely reflecting adenopathy though  perihilar mass is not excluded. No acute infiltrate, pleural effusion or pneumothorax. Diffuse osseous demineralization. IMPRESSION: Suspected RIGHT hilar and RIGHT paratracheal adenopathy ; further assessment by CT chest with contrast recommended to exclude neoplasm. Findings called to Dr. Wyvonnia Dusky On 07/12/2016 at 1612 hours. Electronically Signed   By: Lavonia Dana M.D.   On: 07/12/2016 16:12   Ct Angio Chest Pe W And/or Wo Contrast  Result Date: 07/12/2016 CLINICAL DATA:  Pleuritic chest and right flank pain since last night. Clinical concern for pulmonary embolism. Right paratracheal and right hilar masses on radiographs earlier today. Smoker. EXAM: CT ANGIOGRAPHY CHEST WITH CONTRAST TECHNIQUE: Multidetector CT imaging of the chest was performed using the standard protocol during bolus administration of intravenous contrast. Multiplanar CT image reconstructions and MIPs were obtained to evaluate the vascular anatomy. CONTRAST:  100 cc Isovue 370 COMPARISON:  Chest radiographs obtained earlier today. FINDINGS: Mediastinum/Lymph Nodes: Multiple enlarged mediastinal and right hilar lymph nodes. The hilar nodes are forming a confluent mass measuring 7.3 x 5.4 cm on image number 72 of series 5. These are encasing the central pulmonary arteries on the right, causing marked narrowing of the arteries. None are completely occluded and no pulmonary arterial filling defects are seen. The mass is also encasing the central bronchi on the right, causing narrowing diffuse central bronchial narrowing. The right lower lobe bronchus appears completely occluded or almost completely occluded, with mild peripheral postobstructive changes. The confluent right hilar mass is contiguous with a markedly enlarged subcarinal node with a short axis diameter of 35 mm on image number 59 of series 5. This cannot be separated from the right hilar mass. A right lower paratracheal node has a short axis diameter of 34 mm on image number 43 of  series 5. Lungs/Pleura: Inferior to the right hilum, a right lower lobe mass is demonstrated measuring 3.9 x 3.5 cm on image number 90 of series 7. There is patchy opacity in the adjacent right lower lobe more inferiorly. There is also linear atelectasis in the right lower lobe. No pleural fluid is seen. No nodules elsewhere in either lung. Mild bilateral bullous changes. Upper abdomen: Unremarkable. The included portions of the adrenal glands have normal appearances. The inferior adrenal glands are not included. Musculoskeletal: Thoracic spine degenerative changes. No evidence of bony metastatic disease. Review of the MIP images confirms the above findings. IMPRESSION: 1. 3.9 x 3.5 cm right lower lobe mass with imaging features most compatible with a primary lung carcinoma. 2. Marked metastatic right hilar and mediastinal adenopathy including a confluent mass of adenopathy in the right hilum, encasing and causing marked narrowing of the central pulmonary arteries on the right with a short segment of occlusion of the right lower lobe bronchus. 3. Mild postobstructive changes in the inferior, medial right lower lobe. 4. Mild changes of COPD. Electronically Signed   By: Claudie Revering M.D.   On: 07/12/2016 17:30     I have independently reviewed the above radiologic studies.  Recent Lab Findings: Lab Results  Component Value  Date   WBC 6.2 07/16/2016   HGB 14.1 07/16/2016   HCT 42.3 07/16/2016   PLT 197 07/16/2016   GLUCOSE 98 07/16/2016   ALT 17 07/16/2016   AST 20 07/16/2016   NA 135 07/16/2016   K 4.3 07/16/2016   CL 100 (L) 07/16/2016   CREATININE 0.72 07/16/2016   BUN 19 07/16/2016   CO2 24 07/16/2016   INR 1.01 07/16/2016      Assessment / Plan:   Likely primary lung cancer, possibly small cell. I have reviewed the likely dx with patient and her daughter. I have reccommended we proceeded with bronchoscopy, EBUS possible mediastinoscopy quickly. She will need MRI of brain and PET scan      The goals risks and alternatives of the planned surgical procedure Bronchoscopy, EBUS with Biopsy and possible Mediastinoscopy   have been discussed with the patient in detail. The risks of the procedure including death, infection, stroke, myocardial infarction, bleeding, blood transfusion have all been discussed specifically.  I have quoted Clovia Cuff a 1 % of perioperative mortality and a complication rate as high as 10 %. The patient's questions have been answered.BETHANY HIRT is willing  to proceed with the planned procedure.   Grace Isaac MD      [address] [place] [address] Office [phone]   Beeper [phone]  07/16/2016 7:25 AM

## 2016-07-16 NOTE — Discharge Instructions (Signed)
Flexible Bronchoscopy, Care After Refer to this sheet in the next few weeks. These instructions provide you with information on caring for yourself after your procedure. Your health care provider may also give you more specific instructions. Your treatment has been planned according to current medical practices, but problems sometimes occur. Call your health care provider if you have any problems or questions after your procedure.  WHAT TO EXPECT AFTER THE PROCEDURE It is normal to have the following symptoms for 24-48 hours after the procedure:   Increased cough.  Low-grade fever.  Sore throat or hoarse voice.  Small streaks of blood in your thick spit (sputum) if tissue samples were taken (biopsy). HOME CARE INSTRUCTIONS   Do not eat or drink anything for 2 hours after your procedure. Your nose and throat were numbed by medicine. If you try to eat or drink before the medicine wears off, food or drink could go into your lungs or you could burn yourself. After the numbness is gone and your cough and gag reflexes have returned, you may eat soft food and drink liquids slowly.   The day after the procedure, you can go back to your normal diet.   You may resume normal activities.   Keep all follow-up visits as directed by your health care provider. It is important to keep all your appointments, especially if tissue samples were taken for testing (biopsy). SEEK IMMEDIATE MEDICAL CARE IF:   You have increasing shortness of breath.   You become light-headed or faint.   You have chest pain.   You have any new concerning symptoms.  You cough up more than a small amount of blood.  The amount of blood you cough up increases. MAKE SURE YOU:  Understand these instructions.  Will watch your condition.  Will get help right away if you are not doing well or get worse.   This information is not intended to replace advice given to you by your health care provider. Make sure you discuss  any questions you have with your health care provider.   Document Released: 05/21/2005 Document Revised: 11/22/2014 Document Reviewed: 07/06/2013 Elsevier Interactive Patient Education [employer].

## 2016-07-16 NOTE — Anesthesia Preprocedure Evaluation (Signed)
Anesthesia Evaluation  Patient identified by MRN, date of birth, ID band Patient awake    Reviewed: Allergy & Precautions, NPO status , Patient's Chart, lab work & pertinent test results  Airway Mallampati: II  TM Distance: >3 FB Neck ROM: Full    Dental no notable dental hx. (+) Partial Upper   Pulmonary neg pulmonary ROS, Current Smoker,    Pulmonary exam normal breath sounds clear to auscultation       Cardiovascular hypertension, negative cardio ROS Normal cardiovascular exam Rhythm:Regular Rate:Normal     Neuro/Psych negative neurological ROS  negative psych ROS   GI/Hepatic negative GI ROS, Neg liver ROS,   Endo/Other  negative endocrine ROS  Renal/GU negative Renal ROS  negative genitourinary   Musculoskeletal negative musculoskeletal ROS (+)   Abdominal   Peds negative pediatric ROS (+)  Hematology negative hematology ROS (+)   Anesthesia Other Findings   Reproductive/Obstetrics negative OB ROS                             Anesthesia Physical Anesthesia Plan  ASA: III  Anesthesia Plan: General   Post-op Pain Management:    Induction: Intravenous  Airway Management Planned: Oral ETT  Additional Equipment:   Intra-op Plan:   Post-operative Plan: Extubation in OR  Informed Consent: I have reviewed the patients History and Physical, chart, labs and discussed the procedure including the risks, benefits and alternatives for the proposed anesthesia with the patient or authorized representative who has indicated his/her understanding and acceptance.   Dental advisory given  Plan Discussed with: CRNA  Anesthesia Plan Comments:         Anesthesia Quick Evaluation

## 2016-07-16 NOTE — Anesthesia Postprocedure Evaluation (Signed)
Anesthesia Post Note  Patient: Lydia Moore  Procedure(s) Performed: Procedure(s) (LRB): VIDEO BRONCHOSCOPY WITH ENDOBRONCHIAL ULTRASOUND with ultrasound transbronchial biopsy of node #7 and right lower lobe lesion (N/A)  Patient location during evaluation: PACU Anesthesia Type: General Level of consciousness: awake and alert Pain management: pain level controlled Vital Signs Assessment: post-procedure vital signs reviewed and stable Respiratory status: spontaneous breathing, nonlabored ventilation, respiratory function stable and patient connected to nasal cannula oxygen Cardiovascular status: blood pressure returned to baseline and stable Postop Assessment: no signs of nausea or vomiting Anesthetic complications: no    Last Vitals:  Vitals:   07/16/16 0910 07/16/16 0945  BP:  96/66  Pulse: 86 69  Resp: 19   Temp:      Last Pain:  Vitals:   07/16/16 0555  TempSrc: Oral                 Montez Hageman

## 2016-07-16 NOTE — Transfer of Care (Signed)
Immediate Anesthesia Transfer of Care Note  Patient: Lydia Moore  Procedure(s) Performed: Procedure(s): VIDEO BRONCHOSCOPY WITH ENDOBRONCHIAL ULTRASOUND with ultrasound transbronchial biopsy of node #7. (N/A)  Patient Location: PACU  Anesthesia Type:General  Level of Consciousness: awake, alert  and patient cooperative  Airway & Oxygen Therapy: Patient Spontanous Breathing and Patient connected to face mask oxygen  Post-op Assessment: Report given to RN and Post -op Vital signs reviewed and stable  Post vital signs: Reviewed and stable  Last Vitals:  Vitals:   07/16/16 0555 07/16/16 0902  BP: 123/82   Pulse: 62 78  Resp: 20   Temp: 36.8 C 36.2 C    Last Pain:  Vitals:   07/16/16 0555  TempSrc: Oral      Patients Stated Pain Goal: 4 (31/54/00 8676)  Complications: No apparent anesthesia complications

## 2016-07-17 NOTE — Op Note (Signed)
NAME:  Lydia Moore, Lydia Moore NO.:  [id number]  MEDICAL RECORD NO.:  [id number]  LOCATION:  [facility]                         FACILITY:  [facility]  PHYSICIAN:  Lanelle Bal, MD    DATE OF BIRTH:  [birth year]  DATE OF PROCEDURE:  07/16/2016 DATE OF DISCHARGE:  07/16/2016                              OPERATIVE REPORT   PREOPERATIVE DIAGNOSIS:  Mediastinal adenopathy and right lower lobe lung mass, suspicious of small cell carcinoma.  POSTOPERATIVE DIAGNOSIS:  Mediastinal adenopathy and right lower lobe lung mass, suspicious of small cell carcinoma.  SURGICAL PROCEDURE:  Endoscopic bronchoscopy with transbronchial biopsy of #7 nodes and biopsy of right lower lobe lung mass.  SURGEON:  Lanelle Bal, MD  BRIEF HISTORY:  The patient is a 55 year old female who presented with right flank pain.  Abnormal chest x-ray in the emergency room revealed a right lower lobe lung mass.  CT scan confirmed right lower lobe lung mass with extensive mediastinal adenopathy as suggested above, small- cell carcinoma of the lung.  The patient was seen and recommended to proceed with biopsy.  She agreed and signed informed consent.  DESCRIPTION OF PROCEDURE:  The patient underwent general endotracheal anesthesia without incident.  An 8.5-French endotracheal tube was placed without difficulty.  Appropriate time-out was performed.  We then proceeded with a fiberoptic visual bronchoscopy to the subsegmental level, both in the right and left tracheobronchial tree.  Appropriate photographs are under the media tab.  An epic area of suspicious mass involved the takeoff of the right lower lobe bronchus and the superior segment of the right lower lobe.  We then removed the visual scope, and the EBUS scope was placed.  With this, we easily identified a large #7 lymph node.  Multiple passes of this node were obtained with transbronchial needle biopsy under ultrasound guidance with EBUS scope. Initial  smears of this material were suggestive of a small cell carcinoma.  Additional material was obtained and placed in satellite.  We then removed the scope and the standard bronchoscope was placed and directed toward the right lower lobe bronchus.  Washings of the mass lesion in the right lower lobe bronchus were obtained as were multiple pieces of tissue with the small bronchoscopy, biopsy forceps.  With the sufficient tissue for diagnosis, we then carefully suctioned out the entire tracheobronchial tree.  There was minimal bleeding.  The patient was extubated in the operating room and transferred to the recovery room having tolerated the procedure without obvious complication.     Lanelle Bal, MD     EG/MEDQ  D:  07/16/2016  T:  07/17/2016  Job:  [id number]  cc:   Dr. Whitney Muse

## 2016-07-19 ENCOUNTER — Encounter (HOSPITAL_COMMUNITY): Payer: Self-pay | Admitting: Cardiothoracic Surgery

## 2016-07-21 ENCOUNTER — Encounter (HOSPITAL_COMMUNITY): Payer: Self-pay | Admitting: Hematology & Oncology

## 2016-07-21 ENCOUNTER — Encounter (HOSPITAL_COMMUNITY): Payer: Managed Care, Other (non HMO) | Attending: Hematology & Oncology | Admitting: Hematology & Oncology

## 2016-07-21 ENCOUNTER — Ambulatory Visit (HOSPITAL_COMMUNITY)
Admission: RE | Admit: 2016-07-21 | Discharge: 2016-07-21 | Disposition: A | Discharge status: Home / Self Care | Payer: Managed Care, Other (non HMO) | Source: Ambulatory Visit | Attending: Cardiothoracic Surgery | Admitting: Cardiothoracic Surgery

## 2016-07-21 VITALS — BP 128/98 | HR 76 | Temp 98.6°F | Resp 16 | Ht 64.75 in | Wt 142.1 lb

## 2016-07-21 DIAGNOSIS — R918 Other nonspecific abnormal finding of lung field: Secondary | ICD-10-CM | POA: Insufficient documentation

## 2016-07-21 DIAGNOSIS — C801 Malignant (primary) neoplasm, unspecified: Secondary | ICD-10-CM

## 2016-07-21 DIAGNOSIS — R05 Cough: Secondary | ICD-10-CM

## 2016-07-21 DIAGNOSIS — C3431 Malignant neoplasm of lower lobe, right bronchus or lung: Secondary | ICD-10-CM | POA: Diagnosis not present

## 2016-07-21 DIAGNOSIS — C3490 Malignant neoplasm of unspecified part of unspecified bronchus or lung: Secondary | ICD-10-CM | POA: Insufficient documentation

## 2016-07-21 DIAGNOSIS — R059 Cough, unspecified: Secondary | ICD-10-CM

## 2016-07-21 HISTORY — DX: Malignant (primary) neoplasm, unspecified: C80.1

## 2016-07-21 MED ORDER — GADOBENATE DIMEGLUMINE 529 MG/ML IV SOLN
15.0000 mL | Freq: Once | INTRAVENOUS | Status: AC | PRN
  Administered 2016-07-21: 13 mL via INTRAVENOUS

## 2016-07-21 NOTE — Patient Instructions (Signed)
[facility] at [facility] Discharge Instructions  RECOMMENDATIONS MADE BY THE CONSULTANT AND ANY TEST RESULTS WILL BE SENT TO YOUR REFERRING PHYSICIAN.  You saw Dr. Penland today.  Thank you for choosing [facility] at [facility] to provide your oncology and hematology care.  To afford each patient quality time with our provider, please arrive at least 15 minutes before your scheduled appointment time.   Beginning January 23rd 2017 lab work for the Cancer Center will be done in the  Main lab at [facility] on 1st floor. If you have a lab appointment with the Cancer Center please come in thru the  Main Entrance and check in at the main information desk  You need to re-schedule your appointment should you arrive 10 or more minutes late.  We strive to give you quality time with our providers, and arriving late affects you and other patients whose appointments are after yours.  Also, if you no show three or more times for appointments you may be dismissed from the clinic at the providers discretion.     Again, thank you for choosing [facility].  Our hope is that these requests will decrease the amount of time that you wait before being seen by our physicians.       _____________________________________________________________  Should you have questions after your visit to [facility], please contact our office at [phone] between the hours of 8:30 a.m. and 4:30 p.m.  Voicemails left after 4:30 p.m. will not be returned until the following business day.  For prescription refill requests, have your pharmacy contact our office.         Resources For Cancer Patients and their Caregivers ? American Cancer Society: Can assist with transportation, wigs, general needs, runs Look Good Feel Better.        1-888-227-6333 ? Cancer Care: Provides financial assistance, online support groups, medication/co-pay assistance.   1-800-813-HOPE (4673) ? [facility] Assists [place] cancer patients and their families through emotional , educational and financial support.  336-427-4357 ? [place] DSS Where to apply for food stamps, Medicaid and utility assistance. 336-342-1394 ? [facility]: Transportation to medical appointments. 336-347-2287 ? Social Security Administration: May apply for disability if have a Stage IV cancer. 336-342-7796 1-800-772-1213 ? [place] [facility]ging, Disability and Transit Services: Assists with nutrition, care and transit needs. 336-349-2343  Cancer Center Support Programs: @10RELATIVEDAYS@ > Cancer Support Group  2nd Tuesday of the month 1pm-2pm, Journey Room  > Creative Journey  3rd Tuesday of the month 1130am-1pm, Journey Room  > Look Good Feel Better  1st Wednesday of the month 10am-12 noon, Journey Room (Call American Cancer Society to register 1-800-395-5775)    

## 2016-07-23 ENCOUNTER — Other Ambulatory Visit: Payer: Self-pay | Admitting: Pharmacist

## 2016-07-23 ENCOUNTER — Inpatient Hospital Stay: Admission: RE | Admit: 2016-07-23 | Payer: Managed Care, Other (non HMO) | Source: Ambulatory Visit

## 2016-07-26 ENCOUNTER — Ambulatory Visit (HOSPITAL_COMMUNITY)
Admission: RE | Admit: 2016-07-26 | Discharge: 2016-07-26 | Disposition: A | Discharge status: Home / Self Care | Payer: Managed Care, Other (non HMO) | Source: Ambulatory Visit | Attending: Hematology & Oncology | Admitting: Hematology & Oncology

## 2016-07-26 ENCOUNTER — Other Ambulatory Visit (HOSPITAL_COMMUNITY): Payer: Self-pay | Admitting: Hematology & Oncology

## 2016-07-26 ENCOUNTER — Encounter (HOSPITAL_COMMUNITY)
Admission: RE | Admit: 2016-07-26 | Discharge: 2016-07-26 | Disposition: A | Discharge status: Home / Self Care | Payer: Managed Care, Other (non HMO) | Source: Ambulatory Visit | Attending: Cardiothoracic Surgery | Admitting: Cardiothoracic Surgery

## 2016-07-26 ENCOUNTER — Ambulatory Visit (HOSPITAL_COMMUNITY): Admission: RE | Admit: 2016-07-26 | Payer: Managed Care, Other (non HMO) | Source: Ambulatory Visit

## 2016-07-26 DIAGNOSIS — Z959 Presence of cardiac and vascular implant and graft, unspecified: Secondary | ICD-10-CM | POA: Diagnosis present

## 2016-07-26 DIAGNOSIS — Z9889 Other specified postprocedural states: Secondary | ICD-10-CM | POA: Insufficient documentation

## 2016-07-26 DIAGNOSIS — R918 Other nonspecific abnormal finding of lung field: Secondary | ICD-10-CM | POA: Diagnosis not present

## 2016-07-26 LAB — GLUCOSE, CAPILLARY: Glucose-Capillary: 107 mg/dL — ABNORMAL HIGH (ref 65–99)

## 2016-07-26 MED ORDER — ONDANSETRON HCL 8 MG PO TABS: 8.0000 mg | ORAL_TABLET | Freq: Two times a day (BID) | ORAL | 1 refills | Status: DC | PRN

## 2016-07-26 MED ORDER — PROCHLORPERAZINE MALEATE 10 MG PO TABS: 10.0000 mg | ORAL_TABLET | Freq: Four times a day (QID) | ORAL | 1 refills | Status: DC | PRN

## 2016-07-26 MED ORDER — SODIUM CHLORIDE 0.9% FLUSH: 10.0000 mL | INTRAVENOUS | Status: DC | PRN

## 2016-07-26 MED ORDER — LIDOCAINE-PRILOCAINE 2.5-2.5 % EX CREA: TOPICAL_CREAM | CUTANEOUS | 3 refills | Status: DC

## 2016-07-26 MED ORDER — FLUDEOXYGLUCOSE F - 18 (FDG) INJECTION: 7.2000 | Freq: Once | INTRAVENOUS | Status: DC | PRN

## 2016-07-26 MED ORDER — SODIUM CHLORIDE 0.9% FLUSH: 10.0000 mL | Freq: Two times a day (BID) | INTRAVENOUS | Status: DC

## 2016-07-26 NOTE — Patient Instructions (Addendum)
[facility]   MEDICATIONS:   Zofran/Ondansetron '8mg'$  tablet. Take 1 tablet every 8 hours as needed for nausea/vomiting. (#1 nausea med to take, this can constipate)  Compazine/Prochlorperazine '10mg'$  tablet. Take 1 tablet every 6 hours as needed for nausea/vomiting. (#2 nausea med to take, this can make you sleepy)   EMLA cream. Apply a quarter size amount to port site 1 hour prior to chemo. Do not rub in. Cover with plastic wrap.    Over-the-Counter Meds:  Miralax 1 capful in 8 oz of fluid daily. May increase to two times a day if needed. This is a stool softener. If this doesn't work proceed you can add:  Senokot S  - start with 1 tablet two times a day and increase to 4 tablets two times a day if needed. (total of 8 tablets in a 24 hour period). This is a stimulant laxative.   Call us if this does not help your bowels move.   Imodium '2mg'$  capsule. Take 2 capsules after the 1st loose stool and then 1 capsule every 2 hours until you go a total of 12 hours without having a loose stool. Call the [facility] if loose stools continue. If diarrhea occurs @ bedtime, take 2 capsules @ bedtime. Then take 2 capsules every 4 hours until morning. Call [facility].      (Please refer to/review other teaching materials that have been provided to you in this blue folder - What to Know During Chemo, What to know After Chemo, Dr. Donald Pore Advice, Constipation Sheet, Diarrhea Sheet, Nausea Sheet, Self Care Activities While on Chemo)     SYMPTOMS TO REPORT AS SOON AS POSSIBLE AFTER TREATMENT:  FEVER GREATER THAN 100.5 F  CHILLS WITH OR WITHOUT FEVER  NAUSEA AND VOMITING THAT IS NOT CONTROLLED WITH YOUR NAUSEA MEDICATION  UNUSUAL SHORTNESS OF BREATH  UNUSUAL BRUISING OR BLEEDING  TENDERNESS IN MOUTH AND THROAT WITH OR WITHOUT PRESENCE OF ULCERS  URINARY PROBLEMS  BOWEL PROBLEMS  UNUSUAL RASH    Wear comfortable clothing and clothing appropriate for easy access to  any Portacath or PICC line. Let us know if there is anything that we can do to make your therapy better!      I have been informed and understand all of the instructions given to me and have received a copy. I have been instructed to call the clinic ([id number])  or my family physician as soon as possible for continued medical care, if indicated. I do not have any more questions at this time but understand that I may call the [facility] at ([phone]) during office hours should I have questions or need assistance in obtaining follow-up care.       Cisplatin injection What is this medicine? CISPLATIN (SIS pla tin) is a chemotherapy drug. It targets fast dividing cells, like cancer cells, and causes these cells to die. This medicine is used to treat many types of cancer like bladder, ovarian, and testicular cancers. This medicine may be used for other purposes; ask your health care provider or pharmacist if you have questions. What should I tell my health care provider before I take this medicine? They need to know if you have any of these conditions: -blood disorders -hearing problems -kidney disease -recent or ongoing radiation therapy -an unusual or allergic reaction to cisplatin, carboplatin, other chemotherapy, other medicines, foods, dyes, or preservatives -pregnant or trying to get pregnant -breast-feeding How should I use this medicine? This drug is given as an  infusion into a vein. It is administered in a hospital or clinic by a specially trained health care professional. Talk to your pediatrician regarding the use of this medicine in children. Special care may be needed. Overdosage: If you think you have taken too much of this medicine contact a poison control center or emergency room at once. NOTE: This medicine is only for you. Do not share this medicine with others. What if I miss a dose? It is important not to miss a dose. Call your doctor or health care professional if you are  unable to keep an appointment. What may interact with this medicine? -dofetilide -foscarnet -medicines for seizures -medicines to increase blood counts like filgrastim, pegfilgrastim, sargramostim -probenecid -pyridoxine used with altretamine -rituximab -some antibiotics like amikacin, gentamicin, neomycin, polymyxin B, streptomycin, tobramycin -sulfinpyrazone -vaccines -zalcitabine Talk to your doctor or health care professional before taking any of these medicines: -acetaminophen -aspirin -ibuprofen -ketoprofen -naproxen This list may not describe all possible interactions. Give your health care provider a list of all the medicines, herbs, non-prescription drugs, or dietary supplements you use. Also tell them if you smoke, drink alcohol, or use illegal drugs. Some items may interact with your medicine. What should I watch for while using this medicine? Your condition will be monitored carefully while you are receiving this medicine. You will need important blood work done while you are taking this medicine. This drug may make you feel generally unwell. This is not uncommon, as chemotherapy can affect healthy cells as well as cancer cells. Report any side effects. Continue your course of treatment even though you feel ill unless your doctor tells you to stop. In some cases, you may be given additional medicines to help with side effects. Follow all directions for their use. Call your doctor or health care professional for advice if you get a fever, chills or sore throat, or other symptoms of a cold or flu. Do not treat yourself. This drug decreases your body's ability to fight infections. Try to avoid being around people who are sick. This medicine may increase your risk to bruise or bleed. Call your doctor or health care professional if you notice any unusual bleeding. Be careful brushing and flossing your teeth or using a toothpick because you may get an infection or bleed more easily.  If you have any dental work done, tell your dentist you are receiving this medicine. Avoid taking products that contain aspirin, acetaminophen, ibuprofen, naproxen, or ketoprofen unless instructed by your doctor. These medicines may hide a fever. Do not become pregnant while taking this medicine. Women should inform their doctor if they wish to become pregnant or think they might be pregnant. There is a potential for serious side effects to an unborn child. Talk to your health care professional or pharmacist for more information. Do not breast-feed an infant while taking this medicine. Drink fluids as directed while you are taking this medicine. This will help protect your kidneys. Call your doctor or health care professional if you get diarrhea. Do not treat yourself. What side effects may I notice from receiving this medicine? Side effects that you should report to your doctor or health care professional as soon as possible: -allergic reactions like skin rash, itching or hives, swelling of the face, lips, or tongue -signs of infection - fever or chills, cough, sore throat, pain or difficulty passing urine -signs of decreased platelets or bleeding - bruising, pinpoint red spots on the skin, black, tarry stools, nosebleeds -signs of decreased  red blood cells - unusually weak or tired, fainting spells, lightheadedness -breathing problems -changes in hearing -gout pain -low blood counts - This drug may decrease the number of white blood cells, red blood cells and platelets. You may be at increased risk for infections and bleeding. -nausea and vomiting -pain, swelling, redness or irritation at the injection site -pain, tingling, numbness in the hands or feet -problems with balance, movement -trouble passing urine or change in the amount of urine Side effects that usually do not require medical attention (report to your doctor or health care professional if they continue or are bothersome): -changes  in vision -loss of appetite -metallic taste in the mouth or changes in taste This list may not describe all possible side effects. Call your doctor for medical advice about side effects. You may report side effects to FDA at 1-800-FDA-1088. Where should I keep my medicine? This drug is given in a hospital or clinic and will not be stored at home. NOTE: This sheet is a summary. It may not cover all possible information. If you have questions about this medicine, talk to your doctor, pharmacist, or health care provider.    2016, Elsevier/[employer] Standard. (2008-02-06 14:40:54)       Etoposide, VP-16 injection What is this medicine? ETOPOSIDE, VP-16 (e toe POE side) is a chemotherapy drug. It is used to treat testicular cancer, lung cancer, and other cancers. This medicine may be used for other purposes; ask your health care provider or pharmacist if you have questions. What should I tell my health care provider before I take this medicine? They need to know if you have any of these conditions: -infection -kidney disease -low blood counts, like low white cell, platelet, or red cell counts -an unusual or allergic reaction to etoposide, other chemotherapeutic agents, other medicines, foods, dyes, or preservatives -pregnant or trying to get pregnant -breast-feeding How should I use this medicine? This medicine is for infusion into a vein. It is administered in a hospital or clinic by a specially trained health care professional. Talk to your pediatrician regarding the use of this medicine in children. Special care may be needed. Overdosage: If you think you have taken too much of this medicine contact a poison control center or emergency room at once. NOTE: This medicine is only for you. Do not share this medicine with others. What if I miss a dose? It is important not to miss your dose. Call your doctor or health care professional if you are unable to keep an appointment. What may interact  with this medicine? -aspirin -certain medications for seizures like carbamazepine, phenobarbital, phenytoin, valproic acid -cyclosporine -levamisole -warfarin This list may not describe all possible interactions. Give your health care provider a list of all the medicines, herbs, non-prescription drugs, or dietary supplements you use. Also tell them if you smoke, drink alcohol, or use illegal drugs. Some items may interact with your medicine. What should I watch for while using this medicine? Visit your doctor for checks on your progress. This drug may make you feel generally unwell. This is not uncommon, as chemotherapy can affect healthy cells as well as cancer cells. Report any side effects. Continue your course of treatment even though you feel ill unless your doctor tells you to stop. In some cases, you may be given additional medicines to help with side effects. Follow all directions for their use. Call your doctor or health care professional for advice if you get a fever, chills or sore throat, or  other symptoms of a cold or flu. Do not treat yourself. This drug decreases your body's ability to fight infections. Try to avoid being around people who are sick. This medicine may increase your risk to bruise or bleed. Call your doctor or health care professional if you notice any unusual bleeding. Be careful brushing and flossing your teeth or using a toothpick because you may get an infection or bleed more easily. If you have any dental work done, tell your dentist you are receiving this medicine. Avoid taking products that contain aspirin, acetaminophen, ibuprofen, naproxen, or ketoprofen unless instructed by your doctor. These medicines may hide a fever. Do not become pregnant while taking this medicine or for at least 6 months after stopping it. Women should inform their doctor if they wish to become pregnant or think they might be pregnant. Women of child-bearing potential will need to have a  negative pregnancy test before starting this medicine. There is a potential for serious side effects to an unborn child. Talk to your health care professional or pharmacist for more information. Do not breast-feed an infant while taking this medicine. Men must use a latex condom during sexual contact with a woman while taking this medicine and for at least 4 months after stopping it. A latex condom is needed even if you have had a vasectomy. Contact your doctor right away if your partner becomes pregnant. Do not donate sperm while taking this medicine and for at least 4 months after you stop taking this medicine. Men should inform their doctors if they wish to father a child. This medicine may lower sperm counts. What side effects may I notice from receiving this medicine? Side effects that you should report to your doctor or health care professional as soon as possible: -allergic reactions like skin rash, itching or hives, swelling of the face, lips, or tongue -low blood counts - this medicine may decrease the number of white blood cells, red blood cells and platelets. You may be at increased risk for infections and bleeding. -signs of infection - fever or chills, cough, sore throat, pain or difficulty passing urine -signs of decreased platelets or bleeding - bruising, pinpoint red spots on the skin, black, tarry stools, blood in the urine -signs of decreased red blood cells - unusually weak or tired, fainting spells, lightheadedness -breathing problems -changes in vision -mouth or throat sores or ulcers -pain, redness, swelling or irritation at the injection site -pain, tingling, numbness in the hands or feet -redness, blistering, peeling or loosening of the skin, including inside the mouth -seizures -vomiting Side effects that usually do not require medical attention (report to your doctor or health care professional if they continue or are bothersome): -diarrhea -hair loss -loss of  appetite -nausea -stomach pain This list may not describe all possible side effects. Call your doctor for medical advice about side effects. You may report side effects to FDA at 1-800-FDA-1088. Where should I keep my medicine? This drug is given in a hospital or clinic and will not be stored at home. NOTE: This sheet is a summary. It may not cover all possible information. If you have questions about this medicine, talk to your doctor, pharmacist, or health care provider.    2016, Elsevier/Gold Standard. (2014-06-27 12:32:50)      Pegfilgrastim injection What is this medicine? PEGFILGRASTIM (PEG fil gra stim) is a long-acting granulocyte colony-stimulating factor that stimulates the growth of neutrophils, a type of white blood cell important in the body's fight against  infection. It is used to reduce the incidence of fever and infection in patients with certain types of cancer who are receiving chemotherapy that affects the bone marrow, and to increase survival after being exposed to high doses of radiation. This medicine may be used for other purposes; ask your health care provider or pharmacist if you have questions. What should I tell my health care provider before I take this medicine? They need to know if you have any of these conditions: -kidney disease -latex allergy -ongoing radiation therapy -sickle cell disease -skin reactions to acrylic adhesives (On-Body Injector only) -an unusual or allergic reaction to pegfilgrastim, filgrastim, other medicines, foods, dyes, or preservatives -pregnant or trying to get pregnant -breast-feeding How should I use this medicine? This medicine is for injection under the skin. If you get this medicine at home, you will be taught how to prepare and give the pre-filled syringe or how to use the On-body Injector. Refer to the patient Instructions for Use for detailed instructions. Use exactly as directed. Take your medicine at regular intervals. Do  not take your medicine more often than directed. It is important that you put your used needles and syringes in a special sharps container. Do not put them in a trash can. If you do not have a sharps container, call your pharmacist or healthcare provider to get one. Talk to your pediatrician regarding the use of this medicine in children. While this drug may be prescribed for selected conditions, precautions do apply. Overdosage: If you think you have taken too much of this medicine contact a poison control center or emergency room at once. NOTE: This medicine is only for you. Do not share this medicine with others. What if I miss a dose? It is important not to miss your dose. Call your doctor or health care professional if you miss your dose. If you miss a dose due to an On-body Injector failure or leakage, a new dose should be administered as soon as possible using a single prefilled syringe for manual use. What may interact with this medicine? Interactions have not been studied. Give your health care provider a list of all the medicines, herbs, non-prescription drugs, or dietary supplements you use. Also tell them if you smoke, drink alcohol, or use illegal drugs. Some items may interact with your medicine. This list may not describe all possible interactions. Give your health care provider a list of all the medicines, herbs, non-prescription drugs, or dietary supplements you use. Also tell them if you smoke, drink alcohol, or use illegal drugs. Some items may interact with your medicine. What should I watch for while using this medicine? You may need blood work done while you are taking this medicine. If you are going to need a MRI, CT scan, or other procedure, tell your doctor that you are using this medicine (On-Body Injector only). What side effects may I notice from receiving this medicine? Side effects that you should report to your doctor or health care professional as soon as  possible: -allergic reactions like skin rash, itching or hives, swelling of the face, lips, or tongue -dizziness -fever -pain, redness, or irritation at site where injected -pinpoint red spots on the skin -red or dark-brown urine -shortness of breath or breathing problems -stomach or side pain, or pain at the shoulder -swelling -tiredness -trouble passing urine or change in the amount of urine Side effects that usually do not require medical attention (report to your doctor or health care professional if they  continue or are bothersome): -bone pain -muscle pain This list may not describe all possible side effects. Call your doctor for medical advice about side effects. You may report side effects to FDA at 1-800-FDA-1088. Where should I keep my medicine? Keep out of the reach of children. Store pre-filled syringes in a refrigerator between 2 and 8 degrees C (36 and 46 degrees F). Do not freeze. Keep in carton to protect from light. Throw away this medicine if it is left out of the refrigerator for more than 48 hours. Throw away any unused medicine after the expiration date. NOTE: This sheet is a summary. It may not cover all possible information. If you have questions about this medicine, talk to your doctor, pharmacist, or health care provider.    2016, [employer]. (2014-11-21 14:30:14)

## 2016-07-26 NOTE — Progress Notes (Signed)
Met with pt face to face.  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  Pt given a card with all my information on it.  Told pt to call if they had any questions or concerns.  Pt verbalized understanding.

## 2016-07-26 NOTE — Discharge Instructions (Signed)
PICC Insertion, Care After Refer to this sheet in the next few weeks. These instructions provide you with information on caring for yourself after your procedure. Your health care provider may also give you more specific instructions. Your treatment has been planned according to current medical practices, but problems sometimes occur. Call your health care provider if you have any problems or questions after your procedure. WHAT TO EXPECT AFTER THE PROCEDURE After your procedure, it is typical to have the following:  Mild discomfort at the insertion site. This should not last more than a day. HOME CARE INSTRUCTIONS  Rest at home for the remainder of the day after the procedure.  You may bend your arm and move it freely. If your PICC is near or at the bend of your elbow, avoid activity with repeated motion at the elbow.  Avoid lifting heavy objects as instructed by your health care provider.  Avoid using a crutch with the arm on the same side as your PICC. You may need to use a walker. Bandage Care  Keep your PICC bandage (dressing) clean and dry to prevent infection.  Ask your health care provider when you may shower. To keep the dressing dry, cover the PICC with plastic wrap and tape before showering. If the dressing does become wet, replace it right after the shower.  Do not soak in the bath, swim, or use hot tubs when you have a PICC.  Change the PICC dressing as instructed by your health care provider.  Change your PICC dressing if it becomes loose or wet. General PICC Care  Check the PICC insertion site daily for leakage, redness, swelling, or pain.  Flush the PICC as directed by your health care provider. Let your health care provider know right away if the PICC is difficult to flush or does not flush. Do not use force to flush the PICC.  Do not use a syringe that is less than 10 mL to flush the PICC.  Never pull or tug on the PICC.  Avoid blood pressure checks on the arm  with the PICC.  Keep your PICC identification card with you at all times.  Do not take the PICC out yourself. Only a trained health care professional should remove the PICC. SEEK MEDICAL CARE IF:  You have pain in your arm, ear, face, or teeth.  You have fever or chills.  You have drainage from the PICC insertion site.  You have redness or palpate a "cord" around the PICC insertion site.  You cannot flush the catheter. SEEK IMMEDIATE MEDICAL CARE IF:  You have swelling in the arm in which the PICC is inserted.   This information is not intended to replace advice given to you by your health care provider. Make sure you discuss any questions you have with your health care provider.   Document Released: 08/22/2013 Document Revised: 11/06/2013 Document Reviewed: 08/22/2013 Elsevier Interactive Patient Education 2016 [place] A peripherally inserted central catheter (PICC) is a long, thin, flexible tube that is inserted into a vein in the upper arm. It is a form of intravenous (IV) access. It is considered to be a "central" line because the tip of the PICC ends in a large vein in your chest. This large vein is called the superior vena cava (SVC). The PICC tip ends in the SVC because there is a lot of blood flow in the SVC. This allows medicines and IV fluids to be quickly distributed throughout the body. The  PICC is inserted using a sterile technique by a specially trained nurse or physician. After the PICC is inserted, a chest X-ray exam is done to be sure it is in the correct place.  A PICC may be placed for different reasons, such as:  To give medicines and liquid nutrition that can only be given through a central line. Examples are:  Certain antibiotic treatments.  Chemotherapy.  Total parenteral nutrition (TPN).  To take frequent blood samples.  To give IV fluids and blood products.  If there is difficulty placing a peripheral intravenous (PIV)  catheter. If taken care of properly, a PICC can remain in place for several months. A PICC can also allow a person to go home from the hospital early. Medicine and PICC care can be managed at home by a family member or home health care team. [place] A PICC? Problems with a PICC can occasionally occur. These may include the following:  A blood clot (thrombus) forming in or at the tip of the PICC. This can cause the PICC to become clogged. A clot-dissolving medicine called tissue plasminogen activator (tPA) can be given through the PICC to help break up the clot.  Inflammation of the vein (phlebitis) in which the PICC is placed. Signs of inflammation may include redness, pain at the insertion site, red streaks, or being able to feel a "cord" in the vein where the PICC is located.  Infection in the PICC or at the insertion site. Signs of infection may include fever, chills, redness, swelling, or pus drainage from the PICC insertion site.  PICC movement (malposition). The PICC tip may move from its original position due to excessive physical activity, forceful coughing, sneezing, or vomiting.  A break or cut in the PICC. It is important to not use scissors near the PICC.  Nerve or tendon irritation or injury during PICC insertion. WHAT SHOULD I KEEP IN MIND ABOUT ACTIVITIES WHEN I HAVE A PICC?  You may bend your arm and move it freely. If your PICC is near or at the bend of your elbow, avoid activity with repeated motion at the elbow.  Rest at home for the remainder of the day following PICC line insertion.  Avoid lifting heavy objects as instructed by your health care provider.  Avoid using a crutch with the arm on the same side as your PICC. You may need to use a walker. WHAT SHOULD I KNOW ABOUT MY PICC DRESSING?  Keep your PICC bandage (dressing) clean and dry to prevent infection.  Ask your health care provider when you may shower. Ask your health care  provider to teach you how to wrap the PICC when you do take a shower.  Change the PICC dressing as instructed by your health care provider.  Change your PICC dressing if it becomes loose or wet. WHAT SHOULD I KNOW ABOUT PICC CARE?  Check the PICC insertion site daily for leakage, redness, swelling, or pain.  Do not take a bath, swim, or use hot tubs when you have a PICC. Cover PICC line with clear plastic wrap and tape to keep it dry while showering.  Flush the PICC as directed by your health care provider. Let your health care provider know right away if the PICC is difficult to flush or does not flush. Do not use force to flush the PICC.  Do not use a syringe that is less than 10 mL to flush the PICC.  Never pull or  tug on the PICC.  Avoid blood pressure checks on the arm with the PICC.  Keep your PICC identification card with you at all times.  Do not take the PICC out yourself. Only a trained clinical professional should remove the PICC. SEEK IMMEDIATE MEDICAL CARE IF:  Your PICC is accidentally pulled all the way out. If this happens, cover the insertion site with a bandage or gauze dressing. Do not throw the PICC away. Your health care provider will need to inspect it.  Your PICC was tugged or pulled and has partially come out. Do not  push the PICC back in.  There is any type of drainage, redness, or swelling where the PICC enters the skin.  You cannot flush the PICC, it is difficult to flush, or the PICC leaks around the insertion site when it is flushed.  You hear a "flushing" sound when the PICC is flushed.  You have pain, discomfort, or numbness in your arm, shoulder, or jaw on the same side as the PICC.  You feel your heart "racing" or skipping beats.  You notice a hole or tear in the PICC.  You develop chills or a fever. MAKE SURE YOU:   Understand these instructions.  Will watch your condition.  Will get help right away if you are not doing well or get  worse.   This information is not intended to replace advice given to you by your health care provider. Make sure you discuss any questions you have with your health care provider.   Document Released: 05/08/2003 Document Revised: 11/22/2014 Document Reviewed: 07/09/2013 Elsevier Interactive Patient Education 2016 [place] Catheter/Midline Placement  The IV Nurse has discussed with the patient and/or persons authorized to consent for the patient, the purpose of this procedure and the potential benefits and risks involved with this procedure.  The benefits include less needle sticks, lab draws from the catheter, and the patient may be discharged home with the catheter. Risks include, but not limited to, infection, bleeding, blood clot (thrombus formation), and puncture of an artery; nerve damage and irregular heartbeat and possibility to perform a PICC exchange if needed/ordered by physician.  Alternatives to this procedure were also discussed.  [employer] PICC patient education guide, fact sheet on infection prevention and patient information card has been provided to patient /or left at bedside.    PICC/Midline Placement Documentation  PICC Single Lumen 03/00/92 PICC Right Basilic 37 cm 2 cm (Active)  Indication for Insertion or Continuance of Line Prolonged intravenous therapies 07/26/2016  2:48 PM  Exposed Catheter (cm) 2 cm 07/26/2016  2:48 PM  Site Assessment Clean;Dry;Intact 07/26/2016  2:48 PM  Line Status Flushed;Blood return noted;Capped (central line) 07/26/2016  2:48 PM  Dressing Type Transparent;Securing device 07/26/2016  2:48 PM  Dressing Status Clean;Dry;Intact;Antimicrobial disc in place 07/26/2016  2:48 PM  Line Care Connections checked and tightened 07/26/2016  2:48 PM  Dressing Intervention New dressing 07/26/2016  2:48 PM       Charm Barges S 07/26/2016, 2:52 PM

## 2016-07-26 NOTE — Progress Notes (Signed)
START ON PATHWAY REGIMEN - Small Cell Lung  LOS15: Etoposide Days 1, 2, 3 + Cisplatin Day 1 q21 Days x 4 Cycles with Concurrent Radiation**   A cycle is every 21 days:     Etoposide (Toposar(R)) 100 mg/m2 in a total of 500 mL NS IV over 2 hours days 1, 2, and 3 Dose Mod: None     Cisplatin (Platinol(R)) 75 mg/m2 in a total of 500 mL NS IV over 2 hours day 1 only. **Prehydrate and consider post-hydration** Dose Mod: None  **Always confirm dose/schedule in your pharmacy ordering system**    Patient Characteristics: Limited Stage, First Line Stage Grouping: Limited AJCC M Stage: X AJCC N Stage: X AJCC T Stage: X Line of therapy: First Line Would you be surprised if this patient died  in the next year? I would be surprised if this patient died in the next year  Intent of Therapy: Curative Intent, Discussed with Patient

## 2016-07-27 ENCOUNTER — Encounter (HOSPITAL_BASED_OUTPATIENT_CLINIC_OR_DEPARTMENT_OTHER): Payer: Managed Care, Other (non HMO)

## 2016-07-27 ENCOUNTER — Encounter: Payer: Self-pay | Admitting: *Deleted

## 2016-07-27 ENCOUNTER — Other Ambulatory Visit (HOSPITAL_COMMUNITY): Payer: Self-pay

## 2016-07-27 ENCOUNTER — Encounter (HOSPITAL_COMMUNITY): Payer: Managed Care, Other (non HMO)

## 2016-07-27 ENCOUNTER — Encounter (HOSPITAL_COMMUNITY): Payer: Self-pay | Admitting: Lab

## 2016-07-27 VITALS — BP 151/86 | HR 80 | Temp 98.2°F | Resp 18

## 2016-07-27 DIAGNOSIS — C801 Malignant (primary) neoplasm, unspecified: Secondary | ICD-10-CM | POA: Diagnosis present

## 2016-07-27 DIAGNOSIS — E876 Hypokalemia: Secondary | ICD-10-CM

## 2016-07-27 DIAGNOSIS — C3431 Malignant neoplasm of lower lobe, right bronchus or lung: Secondary | ICD-10-CM | POA: Diagnosis not present

## 2016-07-27 DIAGNOSIS — Z5111 Encounter for antineoplastic chemotherapy: Secondary | ICD-10-CM

## 2016-07-27 LAB — CBC WITH DIFFERENTIAL/PLATELET
BASOS PCT: 0 %
Basophils Absolute: 0 10*3/uL (ref 0.0–0.1)
EOS ABS: 0.1 10*3/uL (ref 0.0–0.7)
EOS PCT: 2 %
HCT: 40.6 % (ref 36.0–46.0)
HEMOGLOBIN: 13.8 g/dL (ref 12.0–15.0)
LYMPHS ABS: 1.1 10*3/uL (ref 0.7–4.0)
Lymphocytes Relative: 16 %
MCH: 30.2 pg (ref 26.0–34.0)
MCHC: 34 g/dL (ref 30.0–36.0)
MCV: 88.8 fL (ref 78.0–100.0)
MONO ABS: 0.3 10*3/uL (ref 0.1–1.0)
MONOS PCT: 4 %
Neutro Abs: 5.2 10*3/uL (ref 1.7–7.7)
Neutrophils Relative %: 78 %
Platelets: 259 10*3/uL (ref 150–400)
RBC: 4.57 MIL/uL (ref 3.87–5.11)
RDW: 12.2 % (ref 11.5–15.5)
WBC: 6.8 10*3/uL (ref 4.0–10.5)

## 2016-07-27 LAB — COMPREHENSIVE METABOLIC PANEL
ALBUMIN: 4.3 g/dL (ref 3.5–5.0)
ALK PHOS: 84 U/L (ref 38–126)
ALT: 17 U/L (ref 14–54)
ANION GAP: 10 (ref 5–15)
AST: 21 U/L (ref 15–41)
BUN: 17 mg/dL (ref 6–20)
CALCIUM: 9.5 mg/dL (ref 8.9–10.3)
CO2: 28 mmol/L (ref 22–32)
Chloride: 96 mmol/L — ABNORMAL LOW (ref 101–111)
Creatinine, Ser: 0.85 mg/dL (ref 0.44–1.00)
GFR calc non Af Amer: >60 mL/min (ref >60)
GLUCOSE: 196 mg/dL — AB (ref 65–99)
POTASSIUM: 3.4 mmol/L — AB (ref 3.5–5.1)
SODIUM: 134 mmol/L — AB (ref 135–145)
TOTAL PROTEIN: 7.2 g/dL (ref 6.5–8.1)
Total Bilirubin: 0.7 mg/dL (ref 0.3–1.2)

## 2016-07-27 LAB — MAGNESIUM: Magnesium: 1.9 mg/dL (ref 1.7–2.4)

## 2016-07-27 MED ORDER — SODIUM CHLORIDE 0.9 % IV SOLN
Freq: Once | INTRAVENOUS | Status: AC
  Administered 2016-07-27: 13:00:00 via INTRAVENOUS

## 2016-07-27 MED ORDER — HEPARIN SOD (PORK) LOCK FLUSH 100 UNIT/ML IV SOLN
500.0000 [IU] | Freq: Once | INTRAVENOUS | Status: AC | PRN
  Administered 2016-07-27: 250 [IU]

## 2016-07-27 MED ORDER — SODIUM CHLORIDE 0.9 % IV SOLN
Freq: Once | INTRAVENOUS | Status: AC
  Administered 2016-07-27: 13:00:00 via INTRAVENOUS
  Filled 2016-07-27: qty 5

## 2016-07-27 MED ORDER — SODIUM CHLORIDE 0.9% FLUSH: 10.0000 mL | INTRAVENOUS | Status: DC | PRN

## 2016-07-27 MED ORDER — PALONOSETRON HCL INJECTION 0.25 MG/5ML
INTRAVENOUS | Status: AC
  Filled 2016-07-27: qty 5

## 2016-07-27 MED ORDER — LORATADINE 10 MG PO TABS: 10.0000 mg | ORAL_TABLET | Freq: Every day | ORAL | 3 refills | Status: DC

## 2016-07-27 MED ORDER — SODIUM CHLORIDE 0.9 % IV SOLN
100.0000 mg/m2 | Freq: Once | INTRAVENOUS | Status: AC
  Administered 2016-07-27: 170 mg via INTRAVENOUS
  Filled 2016-07-27: qty 8.5

## 2016-07-27 MED ORDER — PALONOSETRON HCL INJECTION 0.25 MG/5ML
0.2500 mg | Freq: Once | INTRAVENOUS | Status: AC
  Administered 2016-07-27: 0.25 mg via INTRAVENOUS

## 2016-07-27 MED ORDER — SODIUM CHLORIDE 0.9 % IV SOLN
80.0000 mg/m2 | Freq: Once | INTRAVENOUS | Status: AC
  Administered 2016-07-27: 138 mg via INTRAVENOUS
  Filled 2016-07-27: qty 138

## 2016-07-27 MED ORDER — POTASSIUM CHLORIDE 2 MEQ/ML IV SOLN
Freq: Once | INTRAVENOUS | Status: AC
  Administered 2016-07-27: 10:00:00 via INTRAVENOUS
  Filled 2016-07-27: qty 10

## 2016-07-27 MED ORDER — POTASSIUM CHLORIDE CRYS ER 20 MEQ PO TBCR: 10.0000 meq | EXTENDED_RELEASE_TABLET | Freq: Two times a day (BID) | ORAL | 1 refills | Status: DC

## 2016-07-27 NOTE — Progress Notes (Signed)
[facility] Psychosocial Distress Screening Clinical Social Work  Clinical Social Work was referred by distress screening protocol.  The patient scored a 9 on the Psychosocial Distress Thermometer which indicates severe distress. Clinical Social Worker met with pt and her daughter at her first treatment to assess for distress and other psychosocial needs. CSW introduced self, explained role of CSW and educated pt and daughter about programs/resources at [facility] to assist. Pt feels she has had her informational concerns addressed currently. Pt was educated about availability of dietician for nutritional needs as well. She denied other needs and agrees to reach out as needed.   ONCBCN DISTRESS SCREENING 07/21/2016  Screening Type Initial Screening  Distress experienced in past week (1-10) 9  Information Concerns Type Lack of info about diagnosis;Lack of info about treatment  Physical Problem type Nausea/vomiting;Loss of appetitie;Other (comment)  Physician notified of physical symptoms Yes  Referral to clinical psychology No  Referral to clinical social work No  Referral to dietition No  Referral to financial advocate No  Referral to support programs No  Referral to palliative care No    Clinical Social Worker follow up needed: No.  If yes, follow up plan: Loren Racer, [employer] Tuesdays   [phone]

## 2016-07-27 NOTE — Patient Instructions (Addendum)
[facility] Discharge Instructions for Patients Receiving Chemotherapy   Beginning January 23rd 2017 lab work for the [facility] will be done in the  Main lab at [facility] on 1st floor. If you have a lab appointment with the [facility] please come in thru the  Main Entrance and check in at the main information desk   Today you received the following chemotherapy agents: Cisplatin and Etoposide.    Please be sure to pick up your prescription for potassium and start taking as directed today.    If you develop nausea and vomiting, or diarrhea that is not controlled by your medication, call the clinic.  The clinic phone number is [phone]. Office hours are Monday-Friday 8:30am-5:00pm.  BELOW ARE SYMPTOMS THAT SHOULD BE REPORTED IMMEDIATELY:  *FEVER GREATER THAN 101.0 F  *CHILLS WITH OR WITHOUT FEVER  NAUSEA AND VOMITING THAT IS NOT CONTROLLED WITH YOUR NAUSEA MEDICATION  *UNUSUAL SHORTNESS OF BREATH  *UNUSUAL BRUISING OR BLEEDING  TENDERNESS IN MOUTH AND THROAT WITH OR WITHOUT PRESENCE OF ULCERS  *URINARY PROBLEMS  *BOWEL PROBLEMS  UNUSUAL RASH Items with * indicate a potential emergency and should be followed up as soon as possible. If you have an emergency after office hours please contact your primary care physician or go to the nearest emergency department.  Please call the clinic during office hours if you have any questions or concerns.   You may also contact the Patient Navigator at [phone] should you have any questions or need assistance in obtaining follow up care.      Resources For Cancer Patients and their Caregivers ? American Cancer Society: Can assist with transportation, wigs, general needs, runs Look Good Feel Better.        413-805-6452 ? Cancer Care: Provides financial assistance, online support groups, medication/co-pay assistance.  1-800-813-HOPE (816)221-6773) ? [place] Assists [place]  cancer patients and their families through emotional , educational and financial support.  (647) 652-5748 ? [place] DSS Where to apply for food stamps, Medicaid and utility assistance. 4382255198 ? [facility]: Transportation to medical appointments. (702)188-5395 ? Social Security Administration: May apply for disability if have a Stage IV cancer. 206 704 1166 (825)797-3785 ? [employer], Disability and Transit Services: Assists with nutrition, care and transit needs. [phone]

## 2016-07-27 NOTE — Progress Notes (Unsigned)
Referral sent to [facility],  Referral made to [facility] for port 9/21 '@1100'$ .  Records faxed on 9/12

## 2016-07-27 NOTE — Progress Notes (Signed)
Patient instructed that she had a prescription for potassium that she needed to take to her pharmacy and start taking today per prescription instruction.  Patient and family verbalized understanding.  Patient tolerated infusion well.  VSS.  Patient ambulatory and stable upon discharge.

## 2016-07-28 ENCOUNTER — Encounter (HOSPITAL_BASED_OUTPATIENT_CLINIC_OR_DEPARTMENT_OTHER): Payer: Managed Care, Other (non HMO)

## 2016-07-28 VITALS — BP 154/82 | HR 79 | Temp 97.9°F | Resp 18 | Wt 141.2 lb

## 2016-07-28 DIAGNOSIS — C801 Malignant (primary) neoplasm, unspecified: Secondary | ICD-10-CM

## 2016-07-28 DIAGNOSIS — Z5111 Encounter for antineoplastic chemotherapy: Secondary | ICD-10-CM

## 2016-07-28 DIAGNOSIS — C3431 Malignant neoplasm of lower lobe, right bronchus or lung: Secondary | ICD-10-CM

## 2016-07-28 MED ORDER — SODIUM CHLORIDE 0.9 % IV SOLN
100.0000 mg/m2 | Freq: Once | INTRAVENOUS | Status: AC
  Administered 2016-07-28: 170 mg via INTRAVENOUS
  Filled 2016-07-28: qty 8.5

## 2016-07-28 MED ORDER — SODIUM CHLORIDE 0.9 % IV SOLN
10.0000 mg | Freq: Once | INTRAVENOUS | Status: AC
  Administered 2016-07-28: 10 mg via INTRAVENOUS
  Filled 2016-07-28: qty 1

## 2016-07-28 MED ORDER — SODIUM CHLORIDE 0.9 % IV SOLN
Freq: Once | INTRAVENOUS | Status: AC
  Administered 2016-07-28: 10:00:00 via INTRAVENOUS

## 2016-07-28 MED ORDER — HEPARIN SOD (PORK) LOCK FLUSH 100 UNIT/ML IV SOLN
250.0000 [IU] | Freq: Once | INTRAVENOUS | Status: AC | PRN
  Administered 2016-07-28: 250 [IU]
  Filled 2016-07-28: qty 5

## 2016-07-28 MED ORDER — SODIUM CHLORIDE 0.9% FLUSH
10.0000 mL | INTRAVENOUS | Status: DC | PRN
  Administered 2016-07-28: 10 mL
  Filled 2016-07-28: qty 10

## 2016-07-28 NOTE — Progress Notes (Signed)
Patient refuses flu vaccination per personal preference.  Patient educated on the MD's desire for all patient's to have the flu vaccination because of the immunosuppression associated with treatment.  Patient tolerated infusion well.  No c/o any ill side effects from chemotherapy yesterday or today.  VSS.  Patient ambulatory and stable upon discharge.

## 2016-07-28 NOTE — Patient Instructions (Signed)
[facility] Discharge Instructions for Patients Receiving Chemotherapy   Beginning January 23rd 2017 lab work for the [facility] will be done in the  Main lab at [facility] on 1st floor. If you have a lab appointment with the [facility] please come in thru the  Main Entrance and check in at the main information desk   Today you received the following chemotherapy agent: Etoposide.     If you develop nausea and vomiting, or diarrhea that is not controlled by your medication, call the clinic.  The clinic phone number is [phone]. Office hours are Monday-Friday 8:30am-5:00pm.  BELOW ARE SYMPTOMS THAT SHOULD BE REPORTED IMMEDIATELY:  *FEVER GREATER THAN 101.0 F  *CHILLS WITH OR WITHOUT FEVER  NAUSEA AND VOMITING THAT IS NOT CONTROLLED WITH YOUR NAUSEA MEDICATION  *UNUSUAL SHORTNESS OF BREATH  *UNUSUAL BRUISING OR BLEEDING  TENDERNESS IN MOUTH AND THROAT WITH OR WITHOUT PRESENCE OF ULCERS  *URINARY PROBLEMS  *BOWEL PROBLEMS  UNUSUAL RASH Items with * indicate a potential emergency and should be followed up as soon as possible. If you have an emergency after office hours please contact your primary care physician or go to the nearest emergency department.  Please call the clinic during office hours if you have any questions or concerns.   You may also contact the Patient Navigator at [phone] should you have any questions or need assistance in obtaining follow up care.      Resources For Cancer Patients and their Caregivers ? American Cancer Society: Can assist with transportation, wigs, general needs, runs Look Good Feel Better.        818-625-4844 ? Cancer Care: Provides financial assistance, online support groups, medication/co-pay assistance.  1-800-813-HOPE (916)874-5291) ? [place] Assists [place] cancer patients and their families through emotional , educational and financial support.   443-374-3261 ? [place] DSS Where to apply for food stamps, Medicaid and utility assistance. (517)209-5814 ? [employer]: Transportation to medical appointments. 416-486-1481 ? Social Security Administration: May apply for disability if have a Stage IV cancer. 563 014 9100 343-546-6322 ? [employer], Disability and Transit Services: Assists with nutrition, care and transit needs. [phone]

## 2016-07-29 ENCOUNTER — Encounter (HOSPITAL_BASED_OUTPATIENT_CLINIC_OR_DEPARTMENT_OTHER): Payer: Managed Care, Other (non HMO)

## 2016-07-29 VITALS — BP 155/91 | HR 70 | Temp 97.9°F | Resp 16 | Wt 142.6 lb

## 2016-07-29 DIAGNOSIS — C801 Malignant (primary) neoplasm, unspecified: Secondary | ICD-10-CM

## 2016-07-29 DIAGNOSIS — C3431 Malignant neoplasm of lower lobe, right bronchus or lung: Secondary | ICD-10-CM | POA: Diagnosis not present

## 2016-07-29 DIAGNOSIS — Z5111 Encounter for antineoplastic chemotherapy: Secondary | ICD-10-CM | POA: Diagnosis not present

## 2016-07-29 MED ORDER — SODIUM CHLORIDE 0.9% FLUSH
10.0000 mL | INTRAVENOUS | Status: DC | PRN
  Administered 2016-07-29: 10 mL
  Filled 2016-07-29: qty 10

## 2016-07-29 MED ORDER — PEGFILGRASTIM 6 MG/0.6ML ~~LOC~~ PSKT
6.0000 mg | PREFILLED_SYRINGE | Freq: Once | SUBCUTANEOUS | Status: AC
  Administered 2016-07-29: 6 mg via SUBCUTANEOUS

## 2016-07-29 MED ORDER — PEGFILGRASTIM 6 MG/0.6ML ~~LOC~~ PSKT
PREFILLED_SYRINGE | SUBCUTANEOUS | Status: AC
  Filled 2016-07-29: qty 0.6

## 2016-07-29 MED ORDER — SODIUM CHLORIDE 0.9 % IV SOLN
100.0000 mg/m2 | Freq: Once | INTRAVENOUS | Status: AC
  Administered 2016-07-29: 170 mg via INTRAVENOUS
  Filled 2016-07-29: qty 8.5

## 2016-07-29 MED ORDER — SODIUM CHLORIDE 0.9 % IV SOLN
10.0000 mg | Freq: Once | INTRAVENOUS | Status: AC
  Administered 2016-07-29: 10 mg via INTRAVENOUS
  Filled 2016-07-29: qty 1

## 2016-07-29 MED ORDER — SODIUM CHLORIDE 0.9 % IV SOLN
Freq: Once | INTRAVENOUS | Status: AC
  Administered 2016-07-29: 14:00:00 via INTRAVENOUS

## 2016-07-29 NOTE — Progress Notes (Signed)
Patient educated on Neulasta OnPro as well as PICC line removal education and precautions to take after removal.  Post removal precautions also printed off.  Patient tolerated infusion well.  VSS.  Patient ambulatory and stable upon discharge.  PICC site with no active bleeding, dressing CDI.

## 2016-07-29 NOTE — H&P (Signed)
  NTS SOAP Note  Vital Signs:  Vitals as of: 4/38/3818: Systolic 403: Diastolic 81: Heart Rate 68: Temp 97.36F (Temporal): Height 35f 4.5in: Weight 141Lbs 0 Ounces: BMI 23.83   BMI : 23.83 kg/m2  Subjective: This 55year old female presents for of need for portacath.  Referred by Dr. PWhitney Muse  [facility]  Review of Symptoms:  Constitutional:negative Head:negative Eyes:negative Nose/Mouth/Throat:negative Cardiovascular:negative Respiratory:cough Gastrointestinnegative Genitourinary:negative Skin:negative Hematolgic/Lymphatic:negative Allergic/Immunologic:negative   Past Medical History:Reviewed  Past Medical History  Surgical History: TAH, bronchoscopy Medical Problems: right small cell lung carcinoma, GERD, HTN, high cholesterol Allergies: nkda Medications: norvasc, kdur   Social History:Reviewed  Social History  Preferred Language: English Race:  White Ethnicity: Not Hispanic / Latino Age: [age]year Marital Status:  M Alcohol: no   Smoking Status: Never smoker reviewed on 07/29/2016 Functional Status reviewed on 07/29/2016 ------------------------------------------------ Bathing: Normal Cooking: Normal Dressing: Normal Driving: Normal Eating: Normal Managing Meds: Normal Oral Care: Normal Shopping: Normal Toileting: Normal Transferring: Normal Walking: Normal Cognitive Status reviewed on 07/29/2016 ------------------------------------------------ Attention: Normal Decision Making: Normal Language: Normal Memory: Normal Motor: Normal Perception: Normal Problem Solving: Normal Visual and Spatial: Normal   Family History:Reviewed  Family Health History Mother, Deceased; Heart disease;  Father, Living; Healthy;     Objective Information: General:Well appearing, well nourished in no distress. Skin:no rash or prominent lesions Head:Atraumatic; no masses; no abnormalities Neck:Supple  without lymphadenopathy.  Heart:RRR, no murmur or gallop.  Normal S1, S2.  No S3, S4.  Lungs:CTA bilaterally, no wheezes, rhonchi, rales.  Breathing unlabored. Dr. PDonald Porenotes reviewed. Assessment:SCC right lung carcinoma, need for central venous access  Diagnoses: 162.9  C34.91 Small cell carcinoma of lung (Malignant neoplasm of unspecified part of right bronchus or lung)  Procedures: [id number]- OFFICE OUTPATIENT NEW 30 MINUTES    Plan:  Scheduled for portacath insertion on 08/04/16.   Patient Education:Alternative treatments to surgery were discussed with patient (and family).Risks and benefits  of procedure including bleeding, infection, and pneumothorax were fully explained to the patient (and family) who gave informed consent. Patient/family questions were addressed.  Follow-up:Pending Surgery

## 2016-07-29 NOTE — Patient Instructions (Signed)
[facility] Discharge Instructions for Patients Receiving Chemotherapy   Beginning January 23rd 2017 lab work for the [facility] will be done in the  Main lab at [facility] on 1st floor. If you have a lab appointment with the [facility] please come in thru the  Main Entrance and check in at the main information desk   Today you received the following chemotherapy agent: Etoposide.    PICC Removal, Care After Refer to this sheet in the next few weeks. These instructions provide you with information on caring for yourself after your procedure. Your health care provider may also give you more specific instructions. Your treatment has been planned according to current medical practices, but problems sometimes occur. Call your health care provider if you have any problems or questions after your procedure. WHAT TO EXPECT AFTER THE PROCEDURE After your procedure, it is typical to have mild discomfort at the insertion site. This should not last for more than a day. HOME CARE INSTRUCTIONS You may remove the bandage after 24 hours. The PICC insertion site is very small. A small scab may develop over the insertion site.  It is okay to wash the site gently with soap and water. Be careful not to remove or pick off the scab. Gently pat the site dry after washing it. You do not need to put another bandage over the insertion site. Do not lift anything heavy or do strenuous physical activity for 24 hours after the PICC is removed. This includes:  Weight lifting.  Strenuous yard work.  Any physical activity with repetitive arm movement.  SEEK MEDICAL CARE IF:   You have swelling or puffiness in your arm at the PICC insertion site.  You have increasing tenderness at the PICC insertion site. SEEK IMMEDIATE MEDICAL CARE IF:   You have numbness or tingling in your fingers, hand, or arm.  Your arm looks blue and feels cold.  You have redness around the insertion site or a red  streak goes up your arm.  You have any type of drainage from the PICC insertion site. This includes drainage such as:  Bleeding from the insertion site. If this happens, apply firm, direct pressure to the PICC insertion site with a clean towel.  Drainage that is yellow or tan.  You have a fever.   This information is not intended to replace advice given to you by your health care provider. Make sure you discuss any questions you have with your health care provider.   Document Released: 11/06/2013 Document Revised: 11/22/2014 Document Reviewed: 11/06/2013 Elsevier Interactive Patient Education [employer].    If you develop nausea and vomiting, or diarrhea that is not controlled by your medication, call the clinic.  The clinic phone number is [phone]. Office hours are Monday-Friday 8:30am-5:00pm.  BELOW ARE SYMPTOMS THAT SHOULD BE REPORTED IMMEDIATELY:  *FEVER GREATER THAN 101.0 F  *CHILLS WITH OR WITHOUT FEVER  NAUSEA AND VOMITING THAT IS NOT CONTROLLED WITH YOUR NAUSEA MEDICATION  *UNUSUAL SHORTNESS OF BREATH  *UNUSUAL BRUISING OR BLEEDING  TENDERNESS IN MOUTH AND THROAT WITH OR WITHOUT PRESENCE OF ULCERS  *URINARY PROBLEMS  *BOWEL PROBLEMS  UNUSUAL RASH Items with * indicate a potential emergency and should be followed up as soon as possible. If you have an emergency after office hours please contact your primary care physician or go to the nearest emergency department.  Please call the clinic during office hours if you have any questions or concerns.  You may also contact the Patient Navigator at [phone] should you have any questions or need assistance in obtaining follow up care.      Resources For Cancer Patients and their Caregivers ? American Cancer Society: Can assist with transportation, wigs, general needs, runs Look Good Feel Better.        978-885-1579 ? Cancer Care: Provides financial assistance, online support groups,  medication/co-pay assistance.  1-800-813-HOPE (437)257-4689) ? [place] Assists [place] cancer patients and their families through emotional , educational and financial support.  (504) 858-4222 ? [place] DSS Where to apply for food stamps, Medicaid and utility assistance. 606-268-3462 ? [facility]: Transportation to medical appointments. (430)441-4908 ? Social Security Administration: May apply for disability if have a Stage IV cancer. 979-801-1553 442-005-1448 ? [employer], Disability and Transit Services: Assists with nutrition, care and transit needs. (603)101-0440

## 2016-07-29 NOTE — Progress Notes (Signed)
1600-..Lydia Moore arrived today for ONPRO neulasta on body injector. See MAR for administration details. Injector in place and engaged with green light indicator on flashing. Tolerated application with out problems.  Patient instructed on instructions and use of device. Instruction Card given to patient

## 2016-07-30 NOTE — Patient Instructions (Signed)
Lydia Moore  07/30/2016     '@PREFPERIOPPHARMACY'$ @   Your procedure is scheduled on 08/04/2016.  Report to [facility] at 6:15 A.M.  Call this number if you have problems the morning of surgery:  [phone]   Remember:  Do not eat food or drink liquids after midnight.  Take these medicines the morning of surgery with A SIP OF WATER Compazine or Zofran if needed, Amlodipine, Neulasta, Claritin if needed   Do not wear jewelry, make-up or nail polish.  Do not wear lotions, powders, or perfumes, or deoderant.  Do not shave 48 hours prior to surgery.  Men may shave face and neck.  Do not bring valuables to the hospital.  [facility] is not responsible for any belongings or valuables.  Contacts, dentures or bridgework may not be worn into surgery.  Leave your suitcase in the car.  After surgery it may be brought to your room.  For patients admitted to the hospital, discharge time will be determined by your treatment team.  Patients discharged the day of surgery will not be allowed to drive home.    Please read over the following fact sheets that you were given. Surgical Site Infection Prevention and Anesthesia Post-op Instructions     PATIENT INSTRUCTIONS POST-ANESTHESIA  IMMEDIATELY FOLLOWING SURGERY:  Do not drive or operate machinery for the first twenty four hours after surgery.  Do not make any important decisions for twenty four hours after surgery or while taking narcotic pain medications or sedatives.  If you develop intractable nausea and vomiting or a severe headache please notify your doctor immediately.  FOLLOW-UP:  Please make an appointment with your surgeon as instructed. You do not need to follow up with anesthesia unless specifically instructed to do so.  WOUND CARE INSTRUCTIONS (if applicable):  Keep a dry clean dressing on the anesthesia/puncture wound site if there is drainage.  Once the wound has quit draining you may leave it open to air.  Generally you  should leave the bandage intact for twenty four hours unless there is drainage.  If the epidural site drains for more than 36-48 hours please call the anesthesia department.  QUESTIONS?:  Please feel free to call your physician or the hospital operator if you have any questions, and they will be happy to assist you.      Implanted Port Insertion An implanted port is a central line that has a round shape and is placed under the skin. It is used as a long-term IV access for:   Medicines, such as chemotherapy.   Fluids.   Liquid nutrition, such as total parenteral nutrition (TPN).   Blood samples.  LET [facility] CARE PROVIDER KNOW ABOUT:  Allergies to food or medicine.   Medicines taken, including vitamins, herbs, eye drops, creams, and over-the-counter medicines.   Any allergies to heparin.  Use of steroids (by mouth or creams).   Previous problems with anesthetics or numbing medicines.   History of bleeding problems or blood clots.   Previous surgery.   Other health problems, including diabetes and kidney problems.   Possibility of pregnancy, if this applies. RISKS AND COMPLICATIONS Generally, this is a safe procedure. However, as with any procedure, problems can occur. Possible problems include:  Damage to the blood vessel, bruising, or bleeding at the puncture site.   Infection.  Blood clot in the vessel that the port is in.  Breakdown of the skin over your port.  Very rarely a person may develop  a condition called a pneumothorax, a collection of air in the chest that may cause one of the lungs to collapse. The placement of these catheters with the appropriate imaging guidance significantly decreases the risk of a pneumothorax.  BEFORE THE PROCEDURE   Your health care provider may want you to have blood tests. These tests can help tell how well your kidneys and liver are working. They can also show how well your blood clots.   If you take blood  thinners (anticoagulant medicines), ask your health care provider when you should stop taking them.   Make arrangements for someone to drive you home. This is necessary if you have been sedated for your procedure.  PROCEDURE  Port insertion usually takes about 30-45 minutes.   An IV needle will be inserted in your arm. Medicine for pain and medicine to help relax you (sedative) will flow directly into your body through this needle.   You will lie on an exam table, and you will be connected to monitors to keep track of your heart rate, blood pressure, and breathing throughout the procedure.  An oxygen monitoring device may be attached to your finger. Oxygen will be given.   Everything will be kept as germ free (sterile) as possible during the procedure. The skin near the point of the incision will be cleansed with antiseptic, and the area will be draped with sterile towels. The skin and deeper tissues over the port area will be made numb with a local anesthetic.  Two small cuts (incisions) will be made in the skin to insert the port. One will be made in the neck to obtain access to the vein where the catheter will lie.   Because the port reservoir will be placed under the skin, a small skin incision will be made in the upper chest, and a small pocket for the port will be made under the skin. The catheter that will be connected to the port tunnels to a large central vein in the chest. A small, raised area will remain on your body at the site of the reservoir when the procedure is complete.  The port placement will be done under imaging guidance to ensure the proper placement.  The reservoir has a silicone covering that can be punctured with a special needle.   The port will be flushed with normal saline, and blood will be drawn to make sure it is working properly.  There will be nothing remaining outside the skin when the procedure is finished.   Incisions will be held together by  stitches, surgical glue, or a special tape. AFTER THE PROCEDURE  You will stay in a recovery area until the anesthesia has worn off. Your blood pressure and pulse will be checked.  A final chest X-ray will be taken to check the placement of the port and to ensure that there is no injury to your lung.   This information is not intended to replace advice given to you by your health care provider. Make sure you discuss any questions you have with your health care provider.   Document Released: 08/22/2013 Document Revised: 11/22/2014 Document Reviewed: 08/22/2013 Elsevier Interactive Patient Education [employer].

## 2016-08-02 ENCOUNTER — Encounter (HOSPITAL_COMMUNITY): Payer: Self-pay

## 2016-08-02 ENCOUNTER — Ambulatory Visit (HOSPITAL_COMMUNITY): Payer: Managed Care, Other (non HMO) | Admitting: Hematology & Oncology

## 2016-08-02 ENCOUNTER — Other Ambulatory Visit (HOSPITAL_COMMUNITY): Payer: Managed Care, Other (non HMO)

## 2016-08-02 ENCOUNTER — Encounter (HOSPITAL_COMMUNITY)
Admission: RE | Admit: 2016-08-02 | Discharge: 2016-08-02 | Disposition: A | Discharge status: Home / Self Care | Payer: Managed Care, Other (non HMO) | Source: Ambulatory Visit | Attending: General Surgery | Admitting: General Surgery

## 2016-08-02 DIAGNOSIS — I10 Essential (primary) hypertension: Secondary | ICD-10-CM | POA: Diagnosis not present

## 2016-08-02 DIAGNOSIS — C3491 Malignant neoplasm of unspecified part of right bronchus or lung: Secondary | ICD-10-CM | POA: Diagnosis not present

## 2016-08-02 LAB — CBC
HCT: 37.5 % (ref 36.0–46.0)
Hemoglobin: 12.6 g/dL (ref 12.0–15.0)
MCH: 30.2 pg (ref 26.0–34.0)
MCHC: 33.6 g/dL (ref 30.0–36.0)
MCV: 89.9 fL (ref 78.0–100.0)
PLATELETS: 158 10*3/uL (ref 150–400)
RBC: 4.17 MIL/uL (ref 3.87–5.11)
RDW: 12.3 % (ref 11.5–15.5)
WBC: 14.6 10*3/uL — AB (ref 4.0–10.5)

## 2016-08-02 LAB — BASIC METABOLIC PANEL
ANION GAP: 4 — AB (ref 5–15)
BUN: 19 mg/dL (ref 6–20)
CALCIUM: 8.1 mg/dL — AB (ref 8.9–10.3)
CO2: 25 mmol/L (ref 22–32)
Chloride: 99 mmol/L — ABNORMAL LOW (ref 101–111)
Creatinine, Ser: 0.62 mg/dL (ref 0.44–1.00)
GFR calc Af Amer: >60 mL/min (ref >60)
GLUCOSE: 112 mg/dL — AB (ref 65–99)
Potassium: 3.8 mmol/L (ref 3.5–5.1)
SODIUM: 128 mmol/L — AB (ref 135–145)

## 2016-08-03 NOTE — Anesthesia Preprocedure Evaluation (Addendum)
Anesthesia Evaluation  Patient identified by MRN, date of birth, ID band Patient awake    Reviewed: Allergy & Precautions, NPO status , Patient's Chart, lab work & pertinent test results  History of Anesthesia Complications Negative for: history of anesthetic complications  Airway Mallampati: II  TM Distance: >3 FB Neck ROM: Full    Dental no notable dental hx. (+) Dental Advisory Given   Pulmonary Current Smoker,  Lung Cancwe   Pulmonary exam normal        Cardiovascular hypertension, Normal cardiovascular exam     Neuro/Psych negative neurological ROS  negative psych ROS   GI/Hepatic Neg liver ROS, GERD  ,  Endo/Other  negative endocrine ROS  Renal/GU negative Renal ROS     Musculoskeletal   Abdominal   Peds  Hematology negative hematology ROS (+)   Anesthesia Other Findings   Reproductive/Obstetrics                           Anesthesia Physical Anesthesia Plan  ASA: III  Anesthesia Plan: MAC   Post-op Pain Management:    Induction: Intravenous  Airway Management Planned: Natural Airway and Simple Face Mask  Additional Equipment:   Intra-op Plan:   Post-operative Plan:   Informed Consent: I have reviewed the patients History and Physical, chart, labs and discussed the procedure including the risks, benefits and alternatives for the proposed anesthesia with the patient or authorized representative who has indicated his/her understanding and acceptance.   Dental advisory given  Plan Discussed with:   Anesthesia Plan Comments:        Anesthesia Quick Evaluation

## 2016-08-04 ENCOUNTER — Ambulatory Visit (HOSPITAL_COMMUNITY): Payer: Managed Care, Other (non HMO)

## 2016-08-04 ENCOUNTER — Encounter (HOSPITAL_COMMUNITY)
Admission: RE | Disposition: A | Discharge status: Home / Self Care | Payer: Self-pay | Source: Ambulatory Visit | Attending: General Surgery

## 2016-08-04 ENCOUNTER — Encounter (HOSPITAL_COMMUNITY): Payer: Self-pay | Admitting: Certified Registered Nurse Anesthetist

## 2016-08-04 ENCOUNTER — Ambulatory Visit (HOSPITAL_COMMUNITY): Payer: Managed Care, Other (non HMO) | Admitting: Anesthesiology

## 2016-08-04 ENCOUNTER — Ambulatory Visit (HOSPITAL_COMMUNITY)
Admission: RE | Admit: 2016-08-04 | Discharge: 2016-08-04 | Disposition: A | Discharge status: Home / Self Care | Payer: Managed Care, Other (non HMO) | Source: Ambulatory Visit | Attending: General Surgery | Admitting: General Surgery

## 2016-08-04 DIAGNOSIS — Z95828 Presence of other vascular implants and grafts: Secondary | ICD-10-CM

## 2016-08-04 DIAGNOSIS — C3491 Malignant neoplasm of unspecified part of right bronchus or lung: Secondary | ICD-10-CM | POA: Insufficient documentation

## 2016-08-04 DIAGNOSIS — I10 Essential (primary) hypertension: Secondary | ICD-10-CM | POA: Insufficient documentation

## 2016-08-04 HISTORY — PX: PORTACATH PLACEMENT: SHX2246

## 2016-08-04 SURGERY — INSERTION, TUNNELED CENTRAL VENOUS DEVICE, WITH PORT
Anesthesia: Monitor Anesthesia Care | Site: Chest | Laterality: Left

## 2016-08-04 MED ORDER — LIDOCAINE HCL (PF) 1 % IJ SOLN
INTRAMUSCULAR | Status: AC
  Filled 2016-08-04: qty 5

## 2016-08-04 MED ORDER — CEFAZOLIN SODIUM-DEXTROSE 2-4 GM/100ML-% IV SOLN
2.0000 g | INTRAVENOUS | Status: AC
  Administered 2016-08-04: 2 g via INTRAVENOUS
  Filled 2016-08-04: qty 100

## 2016-08-04 MED ORDER — HYDROMORPHONE HCL 1 MG/ML IJ SOLN: 0.2500 mg | INTRAMUSCULAR | Status: DC | PRN

## 2016-08-04 MED ORDER — LACTATED RINGERS IV SOLN
INTRAVENOUS | Status: DC
  Administered 2016-08-04: 1000 mL via INTRAVENOUS

## 2016-08-04 MED ORDER — PROPOFOL 500 MG/50ML IV EMUL
INTRAVENOUS | Status: DC | PRN
  Administered 2016-08-04: 150 ug/kg/min via INTRAVENOUS

## 2016-08-04 MED ORDER — HEPARIN SOD (PORK) LOCK FLUSH 100 UNIT/ML IV SOLN
INTRAVENOUS | Status: AC
  Filled 2016-08-04: qty 5

## 2016-08-04 MED ORDER — KETOROLAC TROMETHAMINE 30 MG/ML IJ SOLN
30.0000 mg | Freq: Once | INTRAMUSCULAR | Status: AC
  Administered 2016-08-04: 30 mg via INTRAVENOUS
  Filled 2016-08-04: qty 1

## 2016-08-04 MED ORDER — SODIUM CHLORIDE 0.9 % IV SOLN
INTRAVENOUS | Status: DC | PRN
  Administered 2016-08-04: 500 mL via INTRAMUSCULAR

## 2016-08-04 MED ORDER — PROPOFOL 10 MG/ML IV BOLUS
INTRAVENOUS | Status: DC | PRN
  Administered 2016-08-04: 50 mg via INTRAVENOUS

## 2016-08-04 MED ORDER — LIDOCAINE HCL (PF) 1 % IJ SOLN
INTRAMUSCULAR | Status: AC
  Filled 2016-08-04: qty 30

## 2016-08-04 MED ORDER — CHLORHEXIDINE GLUCONATE CLOTH 2 % EX PADS: 6.0000 | MEDICATED_PAD | Freq: Once | CUTANEOUS | Status: DC

## 2016-08-04 MED ORDER — FENTANYL CITRATE (PF) 100 MCG/2ML IJ SOLN
INTRAMUSCULAR | Status: AC
  Filled 2016-08-04: qty 2

## 2016-08-04 MED ORDER — HEPARIN SOD (PORK) LOCK FLUSH 100 UNIT/ML IV SOLN
INTRAVENOUS | Status: DC | PRN
  Administered 2016-08-04: 500 [IU] via INTRAVENOUS

## 2016-08-04 MED ORDER — ONDANSETRON HCL 4 MG/2ML IJ SOLN
INTRAMUSCULAR | Status: AC
  Filled 2016-08-04: qty 2

## 2016-08-04 MED ORDER — ONDANSETRON HCL 4 MG/2ML IJ SOLN
INTRAMUSCULAR | Status: DC | PRN
  Administered 2016-08-04: 4 mg via INTRAVENOUS

## 2016-08-04 MED ORDER — LIDOCAINE HCL (CARDIAC) 10 MG/ML IV SOLN
INTRAVENOUS | Status: DC | PRN
  Administered 2016-08-04: 40 mg via INTRAVENOUS

## 2016-08-04 MED ORDER — HYDROCODONE-ACETAMINOPHEN 5-325 MG PO TABS: 1.0000 | ORAL_TABLET | ORAL | 0 refills | Status: DC | PRN

## 2016-08-04 MED ORDER — FENTANYL CITRATE (PF) 100 MCG/2ML IJ SOLN
INTRAMUSCULAR | Status: DC | PRN
  Administered 2016-08-04 (×4): 25 ug via INTRAVENOUS

## 2016-08-04 MED ORDER — PROPOFOL 10 MG/ML IV BOLUS
INTRAVENOUS | Status: AC
  Filled 2016-08-04: qty 20

## 2016-08-04 MED ORDER — MIDAZOLAM HCL 2 MG/2ML IJ SOLN
INTRAMUSCULAR | Status: AC
  Filled 2016-08-04: qty 2

## 2016-08-04 MED ORDER — LIDOCAINE HCL (PF) 1 % IJ SOLN
INTRAMUSCULAR | Status: DC | PRN
  Administered 2016-08-04: 7 mL

## 2016-08-04 SURGICAL SUPPLY — 37 items
APPLIER CLIP 9.375 SM OPEN (CLIP)
BAG DECANTER FOR FLEXI CONT (MISCELLANEOUS) ×3 IMPLANT
BAG HAMPER (MISCELLANEOUS) ×3 IMPLANT
CATH HICKMAN DUAL 12.0 (CATHETERS) IMPLANT
CHLORAPREP W/TINT 10.5 ML (MISCELLANEOUS) ×3 IMPLANT
CLIP APPLIE 9.375 SM OPEN (CLIP) IMPLANT
CLOTH BEACON ORANGE TIMEOUT ST (SAFETY) ×3 IMPLANT
COVER LIGHT HANDLE STERIS (MISCELLANEOUS) ×6 IMPLANT
DECANTER SPIKE VIAL GLASS SM (MISCELLANEOUS) ×3 IMPLANT
DERMABOND ADVANCED (GAUZE/BANDAGES/DRESSINGS) ×2
DERMABOND ADVANCED .7 DNX12 (GAUZE/BANDAGES/DRESSINGS) ×1 IMPLANT
DRAPE C-ARM FOLDED MOBILE STRL (DRAPES) ×3 IMPLANT
ELECT REM PT RETURN 9FT ADLT (ELECTROSURGICAL) ×3
ELECTRODE REM PT RTRN 9FT ADLT (ELECTROSURGICAL) ×1 IMPLANT
GLOVE BIOGEL M 7.0 STRL (GLOVE) ×3 IMPLANT
GLOVE BIOGEL PI IND STRL 7.0 (GLOVE) ×1 IMPLANT
GLOVE BIOGEL PI INDICATOR 7.0 (GLOVE) ×2
GLOVE EXAM NITRILE PF MED BLUE (GLOVE) ×3 IMPLANT
GLOVE SURG SS PI 7.5 STRL IVOR (GLOVE) ×3 IMPLANT
GOWN STRL REUS W/TWL LRG LVL3 (GOWN DISPOSABLE) ×6 IMPLANT
IV NS 500ML (IV SOLUTION) ×2
IV NS 500ML BAXH (IV SOLUTION) ×1 IMPLANT
KIT PORT POWER 8FR ISP MRI (Port) ×3 IMPLANT
KIT ROOM TURNOVER APOR (KITS) ×3 IMPLANT
MANIFOLD NEPTUNE II (INSTRUMENTS) ×3 IMPLANT
NEEDLE HYPO 25X1 1.5 SAFETY (NEEDLE) ×3 IMPLANT
PACK MINOR (CUSTOM PROCEDURE TRAY) ×3 IMPLANT
PAD ARMBOARD 7.5X6 YLW CONV (MISCELLANEOUS) ×3 IMPLANT
SET BASIN LINEN APH (SET/KITS/TRAYS/PACK) ×3 IMPLANT
SET INTRODUCER 12FR PACEMAKER (SHEATH) IMPLANT
SHEATH COOK PEEL AWAY SET 8F (SHEATH) IMPLANT
SUT PROLENE 3 0 PS 2 (SUTURE) IMPLANT
SUT VIC AB 3-0 SH 27 (SUTURE) ×2
SUT VIC AB 3-0 SH 27X BRD (SUTURE) ×1 IMPLANT
SUT VIC AB 4-0 PS2 27 (SUTURE) ×3 IMPLANT
SYR 20CC LL (SYRINGE) ×3 IMPLANT
SYR CONTROL 10ML LL (SYRINGE) ×3 IMPLANT

## 2016-08-04 NOTE — Discharge Instructions (Signed)
Implanted Port Insertion, Care After °Refer to this sheet in the next few weeks. These instructions provide you with information on caring for yourself after your procedure. Your health care provider may also give you more specific instructions. Your treatment has been planned according to current medical practices, but problems sometimes occur. Call your health care provider if you have any problems or questions after your procedure. °WHAT TO EXPECT AFTER THE PROCEDURE °After your procedure, it is typical to have the following:  °· Discomfort at the port insertion site. Ice packs to the area will help. °· Bruising on the skin over the port. This will subside in 3-4 days. °HOME CARE INSTRUCTIONS °· After your port is placed, you will get a manufacturer's information card. The card has information about your port. Keep this card with you at all times.   °· Know what kind of port you have. There are many types of ports available.   °· Wear a medical alert bracelet in case of an emergency. This can help alert health care workers that you have a port.   °· The port can stay in for as long as your health care provider believes it is necessary.   °· A home health care nurse may give medicines and take care of the port.   °· You or a family member can get special training and directions for giving medicine and taking care of the port at home.   °SEEK MEDICAL CARE IF:  °· Your port does not flush or you are unable to get a blood return.   °· You have a fever or chills. °SEEK IMMEDIATE MEDICAL CARE IF: °· You have new fluid or pus coming from your incision.   °· You notice a bad smell coming from your incision site.   °· You have swelling, pain, or more redness at the incision or port site.   °· You have chest pain or shortness of breath. °  °This information is not intended to replace advice given to you by your health care provider. Make sure you discuss any questions you have with your health care provider. °  °Document  Released: 08/22/2013 Document Revised: 11/06/2013 Document Reviewed: 08/22/2013 °Elsevier Interactive Patient Education ©2016 Elsevier Inc. °Implanted Port Home Guide °An implanted port is a type of central line that is placed under the skin. Central lines are used to provide IV access when treatment or nutrition needs to be given through a person's veins. Implanted ports are used for long-term IV access. An implanted port may be placed because:  °· You need IV medicine that would be irritating to the small veins in your hands or arms.   °· You need long-term IV medicines, such as antibiotics.   °· You need IV nutrition for a long period.   °· You need frequent blood draws for lab tests.   °· You need dialysis.   °Implanted ports are usually placed in the chest area, but they can also be placed in the upper arm, the abdomen, or the leg. An implanted port has two main parts:  °· Reservoir. The reservoir is round and will appear as a small, raised area under your skin. The reservoir is the part where a needle is inserted to give medicines or draw blood.   °· Catheter. The catheter is a thin, flexible tube that extends from the reservoir. The catheter is placed into a large vein. Medicine that is inserted into the reservoir goes into the catheter and then into the vein.   °HOW WILL I CARE FOR MY INCISION SITE? °Do not get the   incision site wet. Bathe or shower as directed by your health care provider.  °HOW IS MY PORT ACCESSED? °Special steps must be taken to access the port:  °· Before the port is accessed, a numbing cream can be placed on the skin. This helps numb the skin over the port site.   °· Your health care provider uses a sterile technique to access the port. °· Your health care provider must put on a mask and sterile gloves. °· The skin over your port is cleaned carefully with an antiseptic and allowed to dry. °· The port is gently pinched between sterile gloves, and a needle is inserted into the  port. °· Only "non-coring" port needles should be used to access the port. Once the port is accessed, a blood return should be checked. This helps ensure that the port is in the vein and is not clogged.   °· If your port needs to remain accessed for a constant infusion, a clear (transparent) bandage will be placed over the needle site. The bandage and needle will need to be changed every week, or as directed by your health care provider.   °· Keep the bandage covering the needle clean and dry. Do not get it wet. Follow your health care provider's instructions on how to take a shower or bath while the port is accessed.   °· If your port does not need to stay accessed, no bandage is needed over the port.   °WHAT IS FLUSHING? °Flushing helps keep the port from getting clogged. Follow your health care provider's instructions on how and when to flush the port. Ports are usually flushed with saline solution or a medicine called heparin. The need for flushing will depend on how the port is used.  °· If the port is used for intermittent medicines or blood draws, the port will need to be flushed:   °· After medicines have been given.   °· After blood has been drawn.   °· As part of routine maintenance.   °· If a constant infusion is running, the port may not need to be flushed.   °HOW LONG WILL MY PORT STAY IMPLANTED? °The port can stay in for as long as your health care provider thinks it is needed. When it is time for the port to come out, surgery will be done to remove it. The procedure is similar to the one performed when the port was put in.  °WHEN SHOULD I SEEK IMMEDIATE MEDICAL CARE? °When you have an implanted port, you should seek immediate medical care if:  °· You notice a bad smell coming from the incision site.   °· You have swelling, redness, or drainage at the incision site.   °· You have more swelling or pain at the port site or the surrounding area.   °· You have a fever that is not controlled with  medicine. °  °This information is not intended to replace advice given to you by your health care provider. Make sure you discuss any questions you have with your health care provider. °  °Document Released: 11/01/2005 Document Revised: 08/22/2013 Document Reviewed: 07/09/2013 °Elsevier Interactive Patient Education ©2016 Elsevier Inc. ° °

## 2016-08-04 NOTE — Transfer of Care (Signed)
Immediate Anesthesia Transfer of Care Note  Patient: Lydia Moore  Procedure(s) Performed: Procedure(s): INSERTION PORT-A-CATH LEFT SUBCLAVIAN (Left)  Patient Location: PACU  Anesthesia Type:MAC  Level of Consciousness: awake, alert , oriented, patient cooperative and responds to stimulation  Airway & Oxygen Therapy: Patient Spontanous Breathing and Patient connected to nasal cannula oxygen  Post-op Assessment: Report given to RN and Post -op Vital signs reviewed and stable  Post vital signs: Reviewed and stable  Last Vitals:  Vitals:   08/04/16 0715 08/04/16 0730  BP: (!) 141/93   Resp: (!) 22 18  Temp:      Last Pain:  Vitals:   08/04/16 0640  PainSc: 0-No pain         Complications: No apparent anesthesia complications

## 2016-08-04 NOTE — Interval H&P Note (Signed)
History and Physical Interval Note:  08/04/2016 7:22 AM  Lydia Moore  has presented today for surgery, with the diagnosis of right small cell lung cancer  The various methods of treatment have been discussed with the patient and family. After consideration of risks, benefits and other options for treatment, the patient has consented to  Procedure(s): INSERTION PORT-A-CATH (N/A) as a surgical intervention .  The patient's history has been reviewed, patient examined, no change in status, stable for surgery.  I have reviewed the patient's chart and labs.  Questions were answered to the patient's satisfaction.     Aviva Signs A

## 2016-08-04 NOTE — Anesthesia Postprocedure Evaluation (Signed)
Anesthesia Post Note  Patient: Lydia Moore  Procedure(s) Performed: Procedure(s) (LRB): INSERTION PORT-A-CATH LEFT SUBCLAVIAN (Left)  Patient location during evaluation: PACU Level of consciousness: awake and alert, oriented, responds to stimulation and patient cooperative Pain management: pain level controlled Vital Signs Assessment: vitals unstable Respiratory status: spontaneous breathing Cardiovascular status: stable Anesthetic complications: no    Last Vitals:  Vitals:   08/04/16 0730 08/04/16 0805  BP:  133/77  Pulse:  71  Resp: 18 17  Temp:  (P) 36.9 C    Last Pain:  Vitals:   08/04/16 0640  PainSc: 0-No pain                 Shamika Pedregon

## 2016-08-04 NOTE — Addendum Note (Signed)
Addendum  created 08/04/16 0817 by Duane Boston, MD   Anesthesia Attestations filed

## 2016-08-04 NOTE — Addendum Note (Signed)
Addendum  created 08/04/16 0810 by Michele Rockers, CRNA   Anesthesia Attestations filed

## 2016-08-04 NOTE — Anesthesia Postprocedure Evaluation (Signed)
Anesthesia Post Note  Patient: Lydia Moore  Procedure(s) Performed: Procedure(s) (LRB): INSERTION PORT-A-CATH LEFT SUBCLAVIAN (Left)  Patient location during evaluation: PACU Anesthesia Type: MAC Level of consciousness: awake and alert Pain management: pain level controlled Vital Signs Assessment: post-procedure vital signs reviewed and stable Respiratory status: spontaneous breathing Cardiovascular status: stable Anesthetic complications: no    Last Vitals:  Vitals:   08/04/16 0830 08/04/16 0832  BP: (!) 141/87 137/80  Pulse: 72 71  Resp: 18 17  Temp:  36.6 C    Last Pain:  Vitals:   08/04/16 0832  TempSrc: Oral  PainSc:                  [name]

## 2016-08-04 NOTE — Anesthesia Procedure Notes (Addendum)
Procedure Name: MAC Date/Time: 08/04/2016 7:35 AM Performed by: Michele Rockers Pre-anesthesia Checklist: Patient identified, Emergency Drugs available, Suction available, Timeout performed and Patient being monitored Patient Re-evaluated:Patient Re-evaluated prior to inductionOxygen Delivery Method: Nasal Cannula

## 2016-08-04 NOTE — Op Note (Signed)
Patient:  Lydia Moore  DOB:  July 08, 1961  MRN:  [id number]   Preop Diagnosis:  Right lung carcinoma  Postop Diagnosis:  Same  Procedure:  Port-A-Cath insertion  Surgeon:  Aviva Signs, M.D.  Anes:  Mac  Indications:  Patient is a 55 year old white female who is about to undergo chemotherapy for right lung carcinoma. The risks and benefits of the procedure including bleeding, infection, and the possibility of a pneumothorax were fully explained to the patient, who gave informed consent.  Procedure note:  The patient was placed in Trendelenburg position after monitored anesthesia care was given. The left upper chest was prepped and draped using the usual sterile technique with DuraPrep. Surgical site confirmation was performed. 1% Xylocaine was used for local anesthesia.  An incision was made below the left clavicle. A subcutaneous pocket was then formed. A needle is advanced into the left subclavian vein using the Seldinger technique without difficulty. A guidewire was then advanced into the right atrium under fluoroscopic guidance. An introducer peel-away sheath were then placed. The catheter was inserted through the peel-away sheath and the peel-away sheath was removed. The catheter was then attached to the port and the port placed in subcutaneous pocket. Adequate positioning was confirmed by fluoroscopy. Good backflow blood was noted in the port. The port was flushed with heparin flush. A power port was inserted. The subcutaneous layer was reapproximated using a 3-0 Vicryl interrupted suture. The skin was closed using a 4-0 Vicryl subcuticular suture. Dermabond was then applied.  All tape and needle counts were correct at the end of the procedure. The patient was transferred to PACU in stable condition. A chest x-ray will be performed at that time.  Complications:  None  EBL:  Minimal  Specimen:  None

## 2016-08-04 NOTE — Progress Notes (Signed)
[place]  Progress Note  Patient Care Team: [place] as PCP - General Danie Binder, MD (Gastroenterology)  CHIEF COMPLAINTS/PURPOSE OF CONSULTATION:  Small cell lung cancer. Limited stage   Small cell carcinoma (HCC)   07/12/2016 Imaging    CTA chest 3.9 x 3.5 cm right lower lobe mass with imaging features most compatible with a primary lung carcinoma.Marked metastatic right hilar and mediastinal adenopathy including a confluent mass of adenopathy in the right hilum, encasing and causing marked narrowing of the central pulmonary arteries on the right with a short segment of occlusion of the right lower lobe bronchus.Mild postobstructive changes in the inferior, medial right lower lobe.Mild changes of COPD       07/16/2016 Procedure    Endoscopic bronchoscopy with transbronchial biopsy of #7 nodes and biopsy of RLL lung mass      07/16/2016 Pathology Results    Lung, biopsy, Right Lower Lobe - SMALL CELL CARCINOMA.       07/21/2016 Imaging    MRI brain Negative MRI of the brain. No evidence for metastatic disease to the brain or meninges.      07/26/2016 PET scan    Markedly hypermetabolic right lower lobe lesion with hypermetabolic metastatic disease in the right hilum and mediastinum. 2. Several scattered hypermetabolic ground-glass opacities in the right upper lobe. FDG accumulation within these nodules is higher than typically seen for infectious/inflammatory etiology although this remains within the differential. Atypical appearance of metastatic spread would also be a consideration. 3. Coronary artery atherosclerosis. 4. **An incidental finding of potential clinical significance has been found. 3.8 cm abdominal aortic aneurysm       07/27/2016 -  Chemotherapy    The patient had palonosetron (ALOXI) injection 0.25 mg, 0.25 mg, Intravenous,  Once, 1 of 4 cycles  pegfilgrastim (NEULASTA ONPRO KIT) injection 6 mg, 6 mg, Subcutaneous,   Once, 1 of 1 cycle  pegfilgrastim (NEULASTA ONPRO KIT) injection 6 mg, 6 mg, Subcutaneous, Once, 0 of 3 cycles  CISplatin (PLATINOL) 138 mg in sodium chloride 0.9 % 500 mL chemo infusion, 80 mg/m2 = 138 mg, Intravenous,  Once, 1 of 4 cycles  etoposide (VEPESID) 170 mg in sodium chloride 0.9 % 500 mL chemo infusion, 100 mg/m2 = 170 mg, Intravenous,  Once, 1 of 4 cycles  fosaprepitant (EMEND) 150 mg, dexamethasone (DECADRON) 12 mg in sodium chloride 0.9 % 145 mL IVPB, , Intravenous,  Once, 1 of 4 cycles  for chemotherapy treatment.  Cisplatin/Etoposide      HISTORY OF PRESENTING ILLNESS:  Lydia Moore 55 y.o. female is here for additional follow-up of limited stage SCLC.   Lydia Moore is accompanied by her daughter.   Eating well, no nausea or vomiting. Took off work. Notes fatigue, worse after ONPRO. Feels better today. NO diarrhea. She is drinking plenty of fluids. Na is 124.  Drinks mostly sprite, some water   Bought boost is going to start tonight.  Not sleeping well, has trouble falling asleep. Only sleeps an hour or two then is awake again. Denies excessive anxiety. Has tried nyquil and melatonin.  Intermittent headaches, no blurry vision.   MEDICAL HISTORY:  Past Medical History:  Diagnosis Date  . Elevated cholesterol   . GERD (gastroesophageal reflux disease)   . GI bleed   . Hypertension     SURGICAL HISTORY: Past Surgical History:  Procedure Laterality Date  . ABDOMINAL HYSTERECTOMY     partial  . CESAREAN SECTION    .  COLONOSCOPY    . TONSILLECTOMY    . VIDEO BRONCHOSCOPY WITH ENDOBRONCHIAL ULTRASOUND N/A 07/16/2016   Procedure: VIDEO BRONCHOSCOPY WITH ENDOBRONCHIAL ULTRASOUND with ultrasound transbronchial biopsy of node #7 and right lower lobe lesion;  Surgeon: Grace Isaac, MD;  Location: [facility] OR;  Service: Thoracic;  Laterality: N/A;    SOCIAL HISTORY: Social History   Social History  . Marital status: Married    Spouse name: N/A  . Number of  children: 4  . Years of education: N/A   Occupational History  . [employer]   Social History Main Topics  . Smoking status: Current Every Day Smoker    Packs/day: 1.00    Years: 35.00    Types: Cigarettes  . Smokeless tobacco: Never Used  . Alcohol use Yes     Comment: occasionally, Couple drinks per mo  . Drug use:     Frequency: 1.0 time per week    Types: Marijuana     Comment: weekly  . Sexual activity: Yes    Birth control/ protection: Surgical     Comment: married   Other Topics Concern  . Not on file   Social History Narrative   Lives w/ husband   Married 36 years 4 children 10 grandchildren Works as a Metallurgist, 1 ppd. She wants to quit but now is not the time. ETOH, none.  FAMILY HISTORY: Family History  Problem Relation Age of Onset  . Heart disease Mother   . Heart disease Father   . COPD Sister   . HIV Brother    Mother deceased at 72 yo of heart disease Father still living at 37 yo 2 sisters living. 1 brother deceased of HIV at 33 yo  ALLERGIES:  has No Known Allergies.  MEDICATIONS:  Current Outpatient Prescriptions  Medication Sig Dispense Refill  . acetaminophen (TYLENOL) 500 MG tablet Take 1,000 mg by mouth every 6 (six) hours as needed for mild pain, fever or headache.    Marland Kitchen amLODipine (NORVASC) 5 MG tablet Take 1 tablet (5 mg total) by mouth daily. 30 tablet 0  . CISPLATIN IV Inject into the vein. Every 21 days    . ETOPOSIDE IV Inject into the vein. Every 21 days    . HYDROcodone-acetaminophen (NORCO) 5-325 MG tablet Take 1-2 tablets by mouth every 4 (four) hours as needed for moderate pain. 30 tablet 0  . lidocaine-prilocaine (EMLA) cream Apply to affected area once 30 g 3  . loratadine (CLARITIN) 10 MG tablet Take 1 tablet (10 mg total) by mouth daily. 30 tablet 3  . ondansetron (ZOFRAN) 8 MG tablet Take 1 tablet (8 mg total) by mouth 2 (two) times daily as needed. Start on the third day after cisplatin chemotherapy.  30 tablet 1  . Pegfilgrastim (NEULASTA ONPRO [place]) Inject into the skin. Every 21 days    . potassium chloride SA (K-DUR,KLOR-CON) 20 MEQ tablet Take 0.5 tablets (10 mEq total) by mouth 2 (two) times daily. 60 tablet 1  . prochlorperazine (COMPAZINE) 10 MG tablet Take 1 tablet (10 mg total) by mouth every 6 (six) hours as needed (Nausea or vomiting). 30 tablet 1  . Pseudoeph-Doxylamine-DM-APAP (NYQUIL PO) Take 30 mLs by mouth at bedtime as needed (sleep).     No current facility-administered medications for this visit.     Review of Systems  Constitutional: Positive for weight loss.       Weight loss of 2 to 3 lbs in the last couple of weeks.  Decreased appetite in the last couple of weeks.  Eyes: Negative.   Respiratory: Positive for cough and shortness of breath.        Intermittent shortness of breath Persistent dry cough  Cardiovascular: Negative.  Negative for leg swelling.  Gastrointestinal: Negative for nausea and vomiting.  Genitourinary: Negative.   Musculoskeletal: Negative.        Leg cramping at night  Skin: Negative.   Neurological: Negative for headaches.  Endo/Heme/Allergies: Negative.   Psychiatric/Behavioral: Negative.   All other systems reviewed and are negative. 14 point Lydia was done and is otherwise as detailed above or in HPI   PHYSICAL EXAMINATION: ECOG PERFORMANCE STATUS: 1 - Symptomatic but completely ambulatory  Vitals:   08/05/16 1405  BP: 139/85  Pulse: 70  Resp: 16  Temp: 98.8 F (37.1 C)   Filed Weights   08/05/16 1405  Weight: 142 lb 3.2 oz (64.5 kg)    Physical Exam  Constitutional: She is oriented to person, place, and time and well-developed, well-nourished, and in no distress.  HENT:  Head: Normocephalic and atraumatic.  Nose: Nose normal.  Mouth/Throat: Oropharynx is clear and moist. No oropharyngeal exudate.  Eyes: Conjunctivae and EOM are normal. Pupils are equal, round, and reactive to light. Right eye exhibits no discharge. Left  eye exhibits no discharge. No scleral icterus.  Neck: Normal range of motion. Neck supple. No tracheal deviation present. No thyromegaly present.  Cardiovascular: Normal rate, regular rhythm and normal heart sounds.  Exam reveals no gallop and no friction rub.   No murmur heard. Pulmonary/Chest: Effort normal. She has no wheezes. She has no rales.  Decreased BS R  Abdominal: Soft. Bowel sounds are normal. She exhibits no distension and no mass. There is no tenderness. There is no rebound and no guarding.  Musculoskeletal: Normal range of motion. She exhibits no edema.  Lymphadenopathy:    She has no cervical adenopathy.  Neurological: She is alert and oriented to person, place, and time. She has normal reflexes. No cranial nerve deficit. Gait normal. Coordination normal.  Skin: Skin is warm and dry. No rash noted.  Psychiatric: Mood, memory, affect and judgment normal.  Nursing note and vitals reviewed.   LABORATORY DATA:  I have reviewed the data as listed Lab Results  Component Value Date   WBC 3.3 (L) 08/05/2016   HGB 13.2 08/05/2016   HCT 38.5 08/05/2016   MCV 87.7 08/05/2016   PLT 119 (L) 08/05/2016   CMP     Component Value Date/Time   NA 124 (L) 08/05/2016 1246   K 3.6 08/05/2016 1246   CL 90 (L) 08/05/2016 1246   CO2 26 08/05/2016 1246   GLUCOSE 107 (H) 08/05/2016 1246   BUN 10 08/05/2016 1246   CREATININE 0.55 08/05/2016 1246   CALCIUM 9.1 08/05/2016 1246   PROT 6.7 08/05/2016 1246   ALBUMIN 4.4 08/05/2016 1246   AST 16 08/05/2016 1246   ALT 19 08/05/2016 1246   ALKPHOS 115 08/05/2016 1246   BILITOT 0.4 08/05/2016 1246   GFRNONAA >60 08/05/2016 1246   GFRAA >60 08/05/2016 1246     RADIOGRAPHIC STUDIES: I have personally reviewed the radiological images as listed and agreed with the findings in the report. Dg Chest Port 1 View  Result Date: 08/04/2016 CLINICAL DATA:  Power port insertion. EXAM: PORTABLE CHEST 1 VIEW COMPARISON:  PET-CT 07/26/2016 .  FINDINGS: PowerPort catheter noted with tip at cavoatrial junction . Cardiomegaly with normal pulmonary vascularity. No focal infiltrate noted. Right hilar  and infrahilar mass again noted. No pleural effusion or pneumothorax. IMPRESSION: PowerPort noted with tip at cavoatrial junction. No complicating features. Right hilar and infrahilar mass again noted. Electronically Signed   By: Marcello Moores  Register   On: 08/04/2016 08:37   Dg C-arm 1-60 Min-no Report  Result Date: 08/04/2016 CLINICAL DATA: right small cell lung cancer C-ARM 1-60 MINUTES Fluoroscopy was utilized by the requesting physician.  No radiographic interpretation.    Study Result  CLINICAL DATA:  Initial treatment strategy for lung mass.  EXAM: NUCLEAR MEDICINE PET SKULL BASE TO THIGH  TECHNIQUE: 7.2 mCi F-18 FDG was injected intravenously. Full-ring PET imaging was performed from the skull base to thigh after the radiotracer. CT data was obtained and used for attenuation correction and anatomic localization.  FASTING BLOOD GLUCOSE:  Value: 107 mg/dl  COMPARISON:  CT chest 07/12/2016.  FINDINGS: NECK  No hypermetabolic lymph nodes in the neck.  CHEST  3.3 x 3.4 cm right lower lobe infrahilar lesion is markedly hypermetabolic with SUV max = 25.0. Abnormal soft tissue tracks cranially up into the inferior right hilum where hypermetabolic right hilar lymphadenopathy demonstrates SUV max = 14.2. Date subcarinal nodal conglomeration measuring approximately 4.5 x 4.0 cm is hypermetabolic with SUV max = 53.9. Hypermetabolic lymphadenopathy is seen in the AP window and right paratracheal station with right paratracheal disease measuring up to SUV max = 15.7. No evidence for hypermetabolic metastatic disease in the left hilum.  Multiple ground-glass nodules are seen in the right upper lobe. Posterior right upper lobe ground-glass nodule seen on image 28 series 8 demonstrates SUV max = 5.9. Anterior and medial 7 x 12  mm ground-glass nodule seen on image 33 series a demonstrates SUV max = 3.7. No evidence for hypermetabolic left lung nodule.  Heart size upper normal. No pericardial effusion. Coronary artery calcification is evident.  ABDOMEN/PELVIS  No evidence for hypermetabolic disease in the liver or spleen. No hypermetabolic lymphadenopathy in the abdomen or pelvis. Abdominal aorta measures up to 3.8 cm in diameter, compatible with aneurysm. No adrenal nodule or mass. No intraperitoneal free fluid.  SKELETON  No focal hypermetabolic activity to suggest skeletal metastasis.  IMPRESSION: 1. Markedly hypermetabolic right lower lobe lesion with hypermetabolic metastatic disease in the right hilum and mediastinum. 2. Several scattered hypermetabolic ground-glass opacities in the right upper lobe. FDG accumulation within these nodules is higher than typically seen for infectious/inflammatory etiology although this remains within the differential. Atypical appearance of metastatic spread would also be a consideration. 3. Coronary artery atherosclerosis. 4. **An incidental finding of potential clinical significance has been found. 3.8 cm abdominal aortic aneurysm Recommend followup by ultrasound in 2 years. This recommendation follows ACR consensus guidelines: White Paper of the ACR Incidental Findings Committee II on Vascular Findings. J Am Coll Radiol 2013; 10:789-794.**   Electronically Signed   By: Misty Stanley M.D.   On: 07/26/2016 09:44   PATHOLOGY     ASSESSMENT & PLAN:  Small cell lung cancer, limited stage Cough Hyponatremia Tobacco Abuse SIADH headaches  To start XRT on Monday with Dr. Sondra Come.   I suspect her hyponatremia is secondary to SIADH. We discussed fluid restriction. Will restrict to 1000 mls daily. WIll recheck NA on Monday. I suspect her NA may be the cause of her headaches.   Questions were answered today regarding limited stage SCLC. I have  previously given the patient and her daughter reading information regarding SCLC.  I addressed the importance of smoking cessation with the patient in  detail.  We discussed the health benefits of cessation.  We discussed the health detriments of ongoing tobacco use including but not limited to COPD, heart disease and malignancy. We reviewed the multiple options for cessation and I offered to refer her to smoking cessation classes. We discussed other alternatives to quit such as chantix, wellbutrin. We will continue to address this moving forward.  She is scheduled to return for follow up on 08/17/16.   ORDERS PLACED FOR THIS ENCOUNTER: Orders Placed This Encounter  Procedures  . Sodium    Standing Status:   Future    Number of Occurrences:   1    Standing Expiration Date:   08/05/2017    All questions were answered. The patient knows to call the clinic with any problems, questions or concerns.  This document serves as a record of services personally performed by Ancil Linsey, MD. It was created on her behalf by Arlyce Harman, a trained medical scribe. The creation of this record is based on the scribe's personal observations and the provider's statements to them. This document has been checked and approved by the attending provider.  I have reviewed the above documentation for accuracy and completeness and I agree with the above.  This note was electronically signed.    Molli Hazard, MD  08/05/2016 2:15 PM

## 2016-08-04 NOTE — Addendum Note (Signed)
Addendum  created 08/04/16 0841 by Vista Deck, CRNA   Sign clinical note

## 2016-08-05 ENCOUNTER — Encounter (HOSPITAL_BASED_OUTPATIENT_CLINIC_OR_DEPARTMENT_OTHER): Payer: Managed Care, Other (non HMO) | Admitting: Hematology & Oncology

## 2016-08-05 ENCOUNTER — Encounter (HOSPITAL_COMMUNITY): Payer: Self-pay | Admitting: Hematology & Oncology

## 2016-08-05 ENCOUNTER — Encounter (HOSPITAL_COMMUNITY): Payer: Managed Care, Other (non HMO)

## 2016-08-05 VITALS — BP 139/85 | HR 70 | Temp 98.8°F | Resp 16 | Wt 142.2 lb

## 2016-08-05 DIAGNOSIS — R51 Headache: Secondary | ICD-10-CM | POA: Diagnosis not present

## 2016-08-05 DIAGNOSIS — Z720 Tobacco use: Secondary | ICD-10-CM

## 2016-08-05 DIAGNOSIS — C801 Malignant (primary) neoplasm, unspecified: Secondary | ICD-10-CM

## 2016-08-05 DIAGNOSIS — E871 Hypo-osmolality and hyponatremia: Secondary | ICD-10-CM | POA: Diagnosis not present

## 2016-08-05 DIAGNOSIS — E222 Syndrome of inappropriate secretion of antidiuretic hormone: Secondary | ICD-10-CM

## 2016-08-05 DIAGNOSIS — C3431 Malignant neoplasm of lower lobe, right bronchus or lung: Secondary | ICD-10-CM | POA: Diagnosis not present

## 2016-08-05 DIAGNOSIS — G4489 Other headache syndrome: Secondary | ICD-10-CM

## 2016-08-05 DIAGNOSIS — R05 Cough: Secondary | ICD-10-CM

## 2016-08-05 LAB — CBC WITH DIFFERENTIAL/PLATELET
BASOS ABS: 0 10*3/uL (ref 0.0–0.1)
Basophils Relative: 1 %
EOS ABS: 0 10*3/uL (ref 0.0–0.7)
Eosinophils Relative: 1 %
HCT: 38.5 % (ref 36.0–46.0)
HEMOGLOBIN: 13.2 g/dL (ref 12.0–15.0)
Lymphocytes Relative: 32 %
Lymphs Abs: 1.1 10*3/uL (ref 0.7–4.0)
MCH: 30.1 pg (ref 26.0–34.0)
MCHC: 34.3 g/dL (ref 30.0–36.0)
MCV: 87.7 fL (ref 78.0–100.0)
MONO ABS: 0.4 10*3/uL (ref 0.1–1.0)
Monocytes Relative: 12 %
NEUTROS PCT: 54 %
Neutro Abs: 1.8 10*3/uL (ref 1.7–7.7)
Platelets: 119 10*3/uL — ABNORMAL LOW (ref 150–400)
RBC: 4.39 MIL/uL (ref 3.87–5.11)
RDW: 11.9 % (ref 11.5–15.5)
WBC: 3.3 10*3/uL — AB (ref 4.0–10.5)

## 2016-08-05 LAB — COMPREHENSIVE METABOLIC PANEL
ALBUMIN: 4.4 g/dL (ref 3.5–5.0)
ALK PHOS: 115 U/L (ref 38–126)
ALT: 19 U/L (ref 14–54)
ANION GAP: 8 (ref 5–15)
AST: 16 U/L (ref 15–41)
BUN: 10 mg/dL (ref 6–20)
CALCIUM: 9.1 mg/dL (ref 8.9–10.3)
CO2: 26 mmol/L (ref 22–32)
CREATININE: 0.55 mg/dL (ref 0.44–1.00)
Chloride: 90 mmol/L — ABNORMAL LOW (ref 101–111)
GFR calc Af Amer: >60 mL/min (ref >60)
GFR calc non Af Amer: >60 mL/min (ref >60)
GLUCOSE: 107 mg/dL — AB (ref 65–99)
Potassium: 3.6 mmol/L (ref 3.5–5.1)
SODIUM: 124 mmol/L — AB (ref 135–145)
Total Bilirubin: 0.4 mg/dL (ref 0.3–1.2)
Total Protein: 6.7 g/dL (ref 6.5–8.1)

## 2016-08-05 LAB — MAGNESIUM: Magnesium: 1.5 mg/dL — ABNORMAL LOW (ref 1.7–2.4)

## 2016-08-05 NOTE — Patient Instructions (Addendum)
[place] [facility] Discharge Instructions  RECOMMENDATIONS MADE BY THE CONSULTANT AND ANY TEST RESULTS WILL BE SENT TO YOUR REFERRING PHYSICIAN.  You saw Dr. Whitney Muse today. Restrict fluids to 1000 mLs daily. Recheck sodium on Monday. Follow up with MD same day as next chemo and labs.  Thank you for choosing [place] at [facility] to provide your oncology and hematology care.  To afford each patient quality time with our provider, please arrive at least 15 minutes before your scheduled appointment time.   Beginning January 23rd 2017 lab work for the [facility] will be done in the  Main lab at [facility] on 1st floor. If you have a lab appointment with the [facility] please come in thru the  Main Entrance and check in at the main information desk  You need to re-schedule your appointment should you arrive 10 or more minutes late.  We strive to give you quality time with our providers, and arriving late affects you and other patients whose appointments are after yours.  Also, if you no show three or more times for appointments you may be dismissed from the clinic at the providers discretion.     Again, thank you for choosing [facility].  Our hope is that these requests will decrease the amount of time that you wait before being seen by our physicians.       _____________________________________________________________  Should you have questions after your visit to [facility], please contact our office at [phone] between the hours of 8:30 a.m. and 4:30 p.m.  Voicemails left after 4:30 p.m. will not be returned until the following business day.  For prescription refill requests, have your pharmacy contact our office.         Resources For Cancer Patients and their Caregivers ? American Cancer Society: Can assist with transportation, wigs, general needs, runs Look Good Feel Better.         (614)111-4071 ? Cancer Care: Provides financial assistance, online support groups, medication/co-pay assistance.  1-800-813-HOPE 845-637-3894) ? [place] Assists [place] cancer patients and their families through emotional , educational and financial support.  810-309-2247 ? [place] DSS Where to apply for food stamps, Medicaid and utility assistance. 680-717-5593 ? [facility]: Transportation to medical appointments. (862) 277-4740 ? Social Security Administration: May apply for disability if have a Stage IV cancer. 838-043-6741 3057524842 ? [employer], Disability and Transit Services: Assists with nutrition, care and transit needs. [place] Support Programs: '@10RELATIVEDAYS'$ @ > Cancer Support Group  2nd Tuesday of the month 1pm-2pm, Journey Room  > Creative Journey  3rd Tuesday of the month 1130am-1pm, Journey Room  > Look Good Feel Better  1st Wednesday of the month 10am-12 noon, Journey Room (Call [place] to register [phone])

## 2016-08-06 ENCOUNTER — Telehealth (HOSPITAL_COMMUNITY): Payer: Self-pay | Admitting: *Deleted

## 2016-08-06 ENCOUNTER — Other Ambulatory Visit (HOSPITAL_COMMUNITY): Payer: Self-pay | Admitting: Emergency Medicine

## 2016-08-06 MED ORDER — MAGNESIUM OXIDE 400 (241.3 MG) MG PO TABS: 400.0000 mg | ORAL_TABLET | Freq: Two times a day (BID) | ORAL | 2 refills | Status: DC

## 2016-08-06 MED ORDER — ZOLPIDEM TARTRATE 10 MG PO TABS
10.0000 mg | ORAL_TABLET | Freq: Every evening | ORAL | 1 refills | Status: DC | PRN
Start: 2016-08-06 — End: 2016-09-28

## 2016-08-06 NOTE — Telephone Encounter (Signed)
-----   Message from Patrici Ranks, MD sent at 08/05/2016  6:54 PM EDT ----- Magnesium oxide 1 tablet twice daily. #60 with 2 refills. Recheck magnesium on Monday with sodium that is already ordered. Dr.P

## 2016-08-06 NOTE — Telephone Encounter (Signed)
Pt aware Magnesium ha been sent in to her pharmacy. Pt verbalized understanding.

## 2016-08-06 NOTE — Progress Notes (Signed)
Notified pt that magnesium was low and that we called magnesium into her pharamcy.  Pt had also asked for something for sleep.  I spoke with Dr Whitney Muse, orders received.  I called ambien 10 mg into the pharmacy with 1 refill.

## 2016-08-09 ENCOUNTER — Encounter (HOSPITAL_COMMUNITY): Payer: Managed Care, Other (non HMO)

## 2016-08-09 DIAGNOSIS — C801 Malignant (primary) neoplasm, unspecified: Secondary | ICD-10-CM

## 2016-08-09 LAB — MAGNESIUM: Magnesium: 2.1 mg/dL (ref 1.7–2.4)

## 2016-08-10 ENCOUNTER — Encounter (HOSPITAL_COMMUNITY): Payer: Self-pay | Admitting: General Surgery

## 2016-08-17 ENCOUNTER — Encounter (HOSPITAL_COMMUNITY): Payer: Managed Care, Other (non HMO) | Attending: Hematology & Oncology

## 2016-08-17 ENCOUNTER — Encounter (HOSPITAL_BASED_OUTPATIENT_CLINIC_OR_DEPARTMENT_OTHER): Payer: Managed Care, Other (non HMO) | Admitting: Hematology & Oncology

## 2016-08-17 ENCOUNTER — Encounter (HOSPITAL_COMMUNITY): Payer: Self-pay

## 2016-08-17 ENCOUNTER — Encounter (HOSPITAL_COMMUNITY): Payer: Self-pay | Admitting: Hematology & Oncology

## 2016-08-17 VITALS — BP 136/71 | HR 73 | Temp 97.8°F | Resp 18 | Wt 142.4 lb

## 2016-08-17 DIAGNOSIS — E222 Syndrome of inappropriate secretion of antidiuretic hormone: Secondary | ICD-10-CM | POA: Diagnosis not present

## 2016-08-17 DIAGNOSIS — C801 Malignant (primary) neoplasm, unspecified: Secondary | ICD-10-CM | POA: Diagnosis not present

## 2016-08-17 DIAGNOSIS — R51 Headache: Secondary | ICD-10-CM

## 2016-08-17 DIAGNOSIS — C3431 Malignant neoplasm of lower lobe, right bronchus or lung: Secondary | ICD-10-CM | POA: Diagnosis not present

## 2016-08-17 DIAGNOSIS — E871 Hypo-osmolality and hyponatremia: Secondary | ICD-10-CM

## 2016-08-17 DIAGNOSIS — R05 Cough: Secondary | ICD-10-CM

## 2016-08-17 DIAGNOSIS — Z720 Tobacco use: Secondary | ICD-10-CM

## 2016-08-17 DIAGNOSIS — F17200 Nicotine dependence, unspecified, uncomplicated: Secondary | ICD-10-CM

## 2016-08-17 DIAGNOSIS — Z5111 Encounter for antineoplastic chemotherapy: Secondary | ICD-10-CM | POA: Diagnosis not present

## 2016-08-17 LAB — COMPREHENSIVE METABOLIC PANEL
ALK PHOS: 102 U/L (ref 38–126)
ALT: 14 U/L (ref 14–54)
AST: 17 U/L (ref 15–41)
Albumin: 4.1 g/dL (ref 3.5–5.0)
Anion gap: 8 (ref 5–15)
BILIRUBIN TOTAL: 0.5 mg/dL (ref 0.3–1.2)
BUN: 21 mg/dL — AB (ref 6–20)
CHLORIDE: 99 mmol/L — AB (ref 101–111)
CO2: 26 mmol/L (ref 22–32)
CREATININE: 0.64 mg/dL (ref 0.44–1.00)
Calcium: 9 mg/dL (ref 8.9–10.3)
GFR calc Af Amer: >60 mL/min (ref >60)
Glucose, Bld: 168 mg/dL — ABNORMAL HIGH (ref 65–99)
Potassium: 4 mmol/L (ref 3.5–5.1)
Sodium: 133 mmol/L — ABNORMAL LOW (ref 135–145)
Total Protein: 7.1 g/dL (ref 6.5–8.1)

## 2016-08-17 LAB — CBC WITH DIFFERENTIAL/PLATELET
BASOS PCT: 0 %
Basophils Absolute: 0 10*3/uL (ref 0.0–0.1)
EOS ABS: 0.1 10*3/uL (ref 0.0–0.7)
Eosinophils Relative: 1 %
HEMATOCRIT: 37.9 % (ref 36.0–46.0)
HEMOGLOBIN: 12.7 g/dL (ref 12.0–15.0)
LYMPHS ABS: 1.1 10*3/uL (ref 0.7–4.0)
LYMPHS PCT: 14 %
MCH: 30.7 pg (ref 26.0–34.0)
MCHC: 33.5 g/dL (ref 30.0–36.0)
MCV: 91.5 fL (ref 78.0–100.0)
Monocytes Absolute: 0.6 10*3/uL (ref 0.1–1.0)
Monocytes Relative: 7 %
NEUTROS ABS: 6.3 10*3/uL (ref 1.7–7.7)
Neutrophils Relative %: 78 %
Platelets: 195 10*3/uL (ref 150–400)
RBC: 4.14 MIL/uL (ref 3.87–5.11)
RDW: 13.7 % (ref 11.5–15.5)
WBC Morphology: INCREASED
WBC: 8.1 10*3/uL (ref 4.0–10.5)

## 2016-08-17 MED ORDER — PALONOSETRON HCL INJECTION 0.25 MG/5ML
0.2500 mg | Freq: Once | INTRAVENOUS | Status: AC
  Administered 2016-08-17: 0.25 mg via INTRAVENOUS
  Filled 2016-08-17: qty 5

## 2016-08-17 MED ORDER — SODIUM CHLORIDE 0.9 % IV SOLN
Freq: Once | INTRAVENOUS | Status: AC
  Administered 2016-08-17: 11:00:00 via INTRAVENOUS
  Filled 2016-08-17: qty 5

## 2016-08-17 MED ORDER — VARENICLINE TARTRATE 0.5 MG X 11 & 1 MG X 42 PO MISC: ORAL | 0 refills | Status: DC

## 2016-08-17 MED ORDER — SODIUM CHLORIDE 0.9 % IV SOLN
80.0000 mg/m2 | Freq: Once | INTRAVENOUS | Status: AC
  Administered 2016-08-17: 138 mg via INTRAVENOUS
  Filled 2016-08-17: qty 100

## 2016-08-17 MED ORDER — HEPARIN SOD (PORK) LOCK FLUSH 100 UNIT/ML IV SOLN
INTRAVENOUS | Status: AC
  Filled 2016-08-17: qty 5

## 2016-08-17 MED ORDER — SODIUM CHLORIDE 0.9 % IV SOLN
100.0000 mg/m2 | Freq: Once | INTRAVENOUS | Status: AC
  Administered 2016-08-17: 170 mg via INTRAVENOUS
  Filled 2016-08-17: qty 8.5

## 2016-08-17 MED ORDER — SODIUM CHLORIDE 0.9% FLUSH
10.0000 mL | INTRAVENOUS | Status: DC | PRN
  Administered 2016-08-17: 10 mL
  Filled 2016-08-17: qty 10

## 2016-08-17 MED ORDER — POTASSIUM CHLORIDE 2 MEQ/ML IV SOLN
Freq: Once | INTRAVENOUS | Status: AC
  Administered 2016-08-17: 09:00:00 via INTRAVENOUS
  Filled 2016-08-17: qty 10

## 2016-08-17 MED ORDER — HEPARIN SOD (PORK) LOCK FLUSH 100 UNIT/ML IV SOLN
500.0000 [IU] | Freq: Once | INTRAVENOUS | Status: AC
  Administered 2016-08-17: 500 [IU] via INTRAVENOUS

## 2016-08-17 MED ORDER — SODIUM CHLORIDE 0.9 % IV SOLN
Freq: Once | INTRAVENOUS | Status: AC
  Administered 2016-08-17: 11:00:00 via INTRAVENOUS

## 2016-08-17 NOTE — Assessment & Plan Note (Signed)
She will continue on magnesium. Will recheck at follow-up. Last magnesium was WNL. Leg cramps are gone. No diarrhea or problems tolerating medication

## 2016-08-17 NOTE — Patient Instructions (Signed)
[facility] Discharge Instructions for Patients Receiving Chemotherapy   Beginning January 23rd 2017 lab work for the [facility] will be done in the  Main lab at [facility] on 1st floor. If you have a lab appointment with the [facility] please come in thru the  Main Entrance and check in at the main information desk   Today you received the following chemotherapy agents Cisplatin, ectopiside  To help prevent nausea and vomiting after your treatment, we encourage you to take your nausea medication     If you develop nausea and vomiting, or diarrhea that is not controlled by your medication, call the clinic.  The clinic phone number is [phone]. Office hours are Monday-Friday 8:30am-5:00pm.  BELOW ARE SYMPTOMS THAT SHOULD BE REPORTED IMMEDIATELY:  *FEVER GREATER THAN 101.0 F  *CHILLS WITH OR WITHOUT FEVER  NAUSEA AND VOMITING THAT IS NOT CONTROLLED WITH YOUR NAUSEA MEDICATION  *UNUSUAL SHORTNESS OF BREATH  *UNUSUAL BRUISING OR BLEEDING  TENDERNESS IN MOUTH AND THROAT WITH OR WITHOUT PRESENCE OF ULCERS  *URINARY PROBLEMS  *BOWEL PROBLEMS  UNUSUAL RASH Items with * indicate a potential emergency and should be followed up as soon as possible. If you have an emergency after office hours please contact your primary care physician or go to the nearest emergency department.  Please call the clinic during office hours if you have any questions or concerns.   You may also contact the Patient Navigator at [phone] should you have any questions or need assistance in obtaining follow up care.      Resources For Cancer Patients and their Caregivers ? American Cancer Society: Can assist with transportation, wigs, general needs, runs Look Good Feel Better.        587-751-8460 ? Cancer Care: Provides financial assistance, online support groups, medication/co-pay assistance.  1-800-813-HOPE (347) 521-8003) ? [place] Assists  [place] cancer patients and their families through emotional , educational and financial support.  810-439-9789 ? [place] DSS Where to apply for food stamps, Medicaid and utility assistance. 936-709-7657 ? [facility]: Transportation to medical appointments. 609 827 0081 ? Social Security Administration: May apply for disability if have a Stage IV cancer. 972-517-9994 765 019 0318 ? [employer], Disability and Transit Services: Assists with nutrition, care and transit needs. [phone]

## 2016-08-17 NOTE — Progress Notes (Signed)
[facility]  Progress Note  Patient Care Team: [place] as PCP - General Danie Binder, MD (Gastroenterology)  CHIEF COMPLAINTS/PURPOSE OF CONSULTATION:  Small cell lung cancer. Limited stage   Small cell carcinoma (HCC)   07/12/2016 Imaging    CTA chest 3.9 x 3.5 cm right lower lobe mass with imaging features most compatible with a primary lung carcinoma.Marked metastatic right hilar and mediastinal adenopathy including a confluent mass of adenopathy in the right hilum, encasing and causing marked narrowing of the central pulmonary arteries on the right with a short segment of occlusion of the right lower lobe bronchus.Mild postobstructive changes in the inferior, medial right lower lobe.Mild changes of COPD       07/16/2016 Procedure    Endoscopic bronchoscopy with transbronchial biopsy of #7 nodes and biopsy of RLL lung mass      07/16/2016 Pathology Results    Lung, biopsy, Right Lower Lobe - SMALL CELL CARCINOMA.       07/21/2016 Imaging    MRI brain Negative MRI of the brain. No evidence for metastatic disease to the brain or meninges.      07/26/2016 PET scan    Markedly hypermetabolic right lower lobe lesion with hypermetabolic metastatic disease in the right hilum and mediastinum. 2. Several scattered hypermetabolic ground-glass opacities in the right upper lobe. FDG accumulation within these nodules is higher than typically seen for infectious/inflammatory etiology although this remains within the differential. Atypical appearance of metastatic spread would also be a consideration. 3. Coronary artery atherosclerosis. 4. **An incidental finding of potential clinical significance has been found. 3.8 cm abdominal aortic aneurysm       07/27/2016 -  Chemotherapy    The patient had palonosetron (ALOXI) injection 0.25 mg, 0.25 mg, Intravenous,  Once, 1 of 4 cycles  pegfilgrastim (NEULASTA ONPRO KIT) injection 6 mg, 6 mg, Subcutaneous,   Once, 1 of 1 cycle  pegfilgrastim (NEULASTA ONPRO KIT) injection 6 mg, 6 mg, Subcutaneous, Once, 0 of 3 cycles  CISplatin (PLATINOL) 138 mg in sodium chloride 0.9 % 500 mL chemo infusion, 80 mg/m2 = 138 mg, Intravenous,  Once, 1 of 4 cycles  etoposide (VEPESID) 170 mg in sodium chloride 0.9 % 500 mL chemo infusion, 100 mg/m2 = 170 mg, Intravenous,  Once, 1 of 4 cycles  fosaprepitant (EMEND) 150 mg, dexamethasone (DECADRON) 12 mg in sodium chloride 0.9 % 145 mL IVPB, , Intravenous,  Once, 1 of 4 cycles  for chemotherapy treatment.  Cisplatin/Etoposide      HISTORY OF PRESENTING ILLNESS:  Lydia Moore 55 y.o. female is here for additional follow-up of limited stage SCLC.   Lydia Moore is in a treatment room. She is here for cycle 2 of Cisplatin/Etoposide. Patient has not  started radiation yet. She had markings done yesterday and will start radiation after chest x-ray.  Patient has cut back on smoking but says she needs some help quitting. She has been smoking for 43 years and is currently smoking about half a pack a day.   Lydia Moore denies experiencing headaches anymore and states her cough has improved.   She says that Boost and Ensure have really helped her a lot. She stopped for two days and noticed a difference, so she started drinking them again.   Patient does not need any refills. No nausea, vomiting, chest pain or SOB.    MEDICAL HISTORY:  Past Medical History:  Diagnosis Date  . Elevated cholesterol   . GERD (gastroesophageal  reflux disease)   . GI bleed   . Hypertension     SURGICAL HISTORY: Past Surgical History:  Procedure Laterality Date  . ABDOMINAL HYSTERECTOMY     partial  . CESAREAN SECTION    . COLONOSCOPY    . PORTACATH PLACEMENT Left 08/04/2016   Procedure: INSERTION PORT-A-CATH LEFT SUBCLAVIAN;  Surgeon: Aviva Signs, MD;  Location: [facility];  Service: General;  Laterality: Left;  . TONSILLECTOMY    . VIDEO BRONCHOSCOPY WITH ENDOBRONCHIAL ULTRASOUND  N/A 07/16/2016   Procedure: VIDEO BRONCHOSCOPY WITH ENDOBRONCHIAL ULTRASOUND with ultrasound transbronchial biopsy of node #7 and right lower lobe lesion;  Surgeon: Grace Isaac, MD;  Location: [facility];  Service: Thoracic;  Laterality: N/A;    SOCIAL HISTORY: Social History   Social History  . Marital status: Married    Spouse name: N/A  . Number of children: 4  . Years of education: N/A   Occupational History  . [employer]   Social History Main Topics  . Smoking status: Current Every Day Smoker    Packs/day: 0.50    Years: 35.00    Types: Cigarettes  . Smokeless tobacco: Never Used  . Alcohol use Yes     Comment: occasionally, Couple drinks per mo  . Drug use:     Frequency: 1.0 time per week    Types: Marijuana     Comment: weekly  . Sexual activity: Yes    Birth control/ protection: Surgical     Comment: married   Other Topics Concern  . Not on file   Social History Narrative   Lives w/ husband   Married 36 years 4 children 10 grandchildren Works as a Metallurgist, 1 ppd. She wants to quit but now is not the time. ETOH, none.  FAMILY HISTORY: Family History  Problem Relation Age of Onset  . Heart disease Mother   . Heart disease Father   . COPD Sister   . HIV Brother    Mother deceased at 48 yo of heart disease Father still living at 41 yo 2 sisters living. 1 brother deceased of HIV at 56 yo  ALLERGIES:  has No Known Allergies.  MEDICATIONS:  Current Outpatient Prescriptions  Medication Sig Dispense Refill  . acetaminophen (TYLENOL) 500 MG tablet Take 1,000 mg by mouth every 6 (six) hours as needed for mild pain, fever or headache.    Marland Kitchen amLODipine (NORVASC) 5 MG tablet Take 1 tablet (5 mg total) by mouth daily. 30 tablet 0  . CISPLATIN IV Inject into the vein. Every 21 days    . ETOPOSIDE IV Inject into the vein. Every 21 days    . HYDROcodone-acetaminophen (NORCO) 5-325 MG tablet Take 1-2 tablets by mouth every 4 (four) hours as  needed for moderate pain. 30 tablet 0  . lidocaine-prilocaine (EMLA) cream Apply to affected area once 30 g 3  . loratadine (CLARITIN) 10 MG tablet Take 1 tablet (10 mg total) by mouth daily. 30 tablet 3  . magnesium oxide (MAG-OX) 400 (241.3 Mg) MG tablet Take 1 tablet (400 mg total) by mouth 2 (two) times daily. 60 tablet 2  . ondansetron (ZOFRAN) 8 MG tablet Take 1 tablet (8 mg total) by mouth 2 (two) times daily as needed. Start on the third day after cisplatin chemotherapy. 30 tablet 1  . Pegfilgrastim (NEULASTA ONPRO [place]) Inject into the skin. Every 21 days    . potassium chloride SA (K-DUR,KLOR-CON) 20 MEQ tablet Take 0.5 tablets (10 mEq total)  by mouth 2 (two) times daily. 60 tablet 1  . prochlorperazine (COMPAZINE) 10 MG tablet Take 1 tablet (10 mg total) by mouth every 6 (six) hours as needed (Nausea or vomiting). 30 tablet 1  . Pseudoeph-Doxylamine-DM-APAP (NYQUIL PO) Take 30 mLs by mouth at bedtime as needed (sleep).    . varenicline (CHANTIX STARTING MONTH PAK) 0.5 MG X 11 & 1 MG X 42 tablet Take one 0.5 mg tablet by mouth once daily for 3 days, then increase to one 0.5 mg tablet twice daily for 4 days, then increase to one 1 mg tablet twice daily. 53 tablet 0  . zolpidem (AMBIEN) 10 MG tablet Take 1 tablet (10 mg total) by mouth at bedtime as needed for sleep. 30 tablet 1   No current facility-administered medications for this visit.    Facility-Administered Medications Ordered in Other Visits  Medication Dose Route Frequency Provider Last Rate Last Dose  . 0.9 %  sodium chloride infusion   Intravenous Once Patrici Ranks, MD      . etoposide (VEPESID) 170 mg in sodium chloride 0.9 % 500 mL chemo infusion  100 mg/m2 (Treatment Plan Recorded) Intravenous Once Patrici Ranks, MD      . fosaprepitant (EMEND) 150 mg, dexamethasone (DECADRON) 12 mg in sodium chloride 0.9 % 145 mL IVPB   Intravenous Once Patrici Ranks, MD      . palonosetron (ALOXI) injection 0.25 mg  0.25 mg  Intravenous Once Patrici Ranks, MD      . sodium chloride flush (NS) 0.9 % injection 10 mL  10 mL Intracatheter PRN Patrici Ranks, MD   10 mL at 08/17/16 7106    Review of Systems  Eyes: Negative.   Respiratory: Positive for cough and shortness of breath.        Intermittent shortness of breath Cough has improved   Cardiovascular: Negative.  Negative for leg swelling.  Gastrointestinal: Negative for nausea and vomiting.  Genitourinary: Negative.   Musculoskeletal: Negative.   Skin: Negative.   Neurological: Negative for headaches.  Endo/Heme/Allergies: Negative.   Psychiatric/Behavioral: Negative.   All other systems reviewed and are negative. 14 point ROS was done and is otherwise as detailed above or in HPI   PHYSICAL EXAMINATION: ECOG PERFORMANCE STATUS: 1 - Symptomatic but completely ambulatory  Vitals with BMI 08/17/2016  Height   Weight 142 lbs 6 oz  BMI   Systolic 269  Diastolic 83  Pulse 76  Respirations 18    Physical Exam  Constitutional: She is oriented to person, place, and time and well-developed, well-nourished, and in no distress.  HENT:  Head: Normocephalic and atraumatic.  Nose: Nose normal.  Mouth/Throat: Oropharynx is clear and moist. No oropharyngeal exudate.  Eyes: Conjunctivae and EOM are normal. Pupils are equal, round, and reactive to light. Right eye exhibits no discharge. Left eye exhibits no discharge. No scleral icterus.  Neck: Normal range of motion. Neck supple. No tracheal deviation present. No thyromegaly present.  Cardiovascular: Normal rate, regular rhythm and normal heart sounds.  Exam reveals no gallop and no friction rub.   No murmur heard. Pulmonary/Chest: Effort normal. She has no wheezes. She has no rales.  Decreased BS R  Abdominal: Soft. Bowel sounds are normal. She exhibits no distension and no mass. There is no tenderness. There is no rebound and no guarding.  Musculoskeletal: Normal range of motion. She exhibits no  edema.  Lymphadenopathy:    She has no cervical adenopathy.  Neurological: She is  alert and oriented to person, place, and time. She has normal reflexes. No cranial nerve deficit. Gait normal. Coordination normal.  Skin: Skin is warm and dry. No rash noted.  Psychiatric: Mood, memory, affect and judgment normal.  Nursing note and vitals reviewed.   LABORATORY DATA:  I have reviewed the data as listed Lab Results  Component Value Date   WBC 8.1 08/17/2016   HGB 12.7 08/17/2016   HCT 37.9 08/17/2016   MCV 91.5 08/17/2016   PLT 195 08/17/2016   CMP     Component Value Date/Time   NA 133 (L) 08/17/2016 0803   K 4.0 08/17/2016 0803   CL 99 (L) 08/17/2016 0803   CO2 26 08/17/2016 0803   GLUCOSE 168 (H) 08/17/2016 0803   BUN 21 (H) 08/17/2016 0803   CREATININE 0.64 08/17/2016 0803   CALCIUM 9.0 08/17/2016 0803   PROT 7.1 08/17/2016 0803   ALBUMIN 4.1 08/17/2016 0803   AST 17 08/17/2016 0803   ALT 14 08/17/2016 0803   ALKPHOS 102 08/17/2016 0803   BILITOT 0.5 08/17/2016 0803   GFRNONAA >60 08/17/2016 0803   GFRAA >60 08/17/2016 0803     RADIOGRAPHIC STUDIES: I have personally reviewed the radiological images as listed and agreed with the findings in the report. No results found.  Study Result  CLINICAL DATA:  Initial treatment strategy for lung mass.  EXAM: NUCLEAR MEDICINE PET SKULL BASE TO THIGH  TECHNIQUE: 7.2 mCi F-18 FDG was injected intravenously. Full-ring PET imaging was performed from the skull base to thigh after the radiotracer. CT data was obtained and used for attenuation correction and anatomic localization.  FASTING BLOOD GLUCOSE:  Value: 107 mg/dl  COMPARISON:  CT chest 07/12/2016.  FINDINGS: NECK  No hypermetabolic lymph nodes in the neck.  CHEST  3.3 x 3.4 cm right lower lobe infrahilar lesion is markedly hypermetabolic with SUV max = 50.2. Abnormal soft tissue tracks cranially up into the inferior right hilum where  hypermetabolic right hilar lymphadenopathy demonstrates SUV max = 14.2. Date subcarinal nodal conglomeration measuring approximately 4.5 x 4.0 cm is hypermetabolic with SUV max = 77.4. Hypermetabolic lymphadenopathy is seen in the AP window and right paratracheal station with right paratracheal disease measuring up to SUV max = 15.7. No evidence for hypermetabolic metastatic disease in the left hilum.  Multiple ground-glass nodules are seen in the right upper lobe. Posterior right upper lobe ground-glass nodule seen on image 28 series 8 demonstrates SUV max = 5.9. Anterior and medial 7 x 12 mm ground-glass nodule seen on image 33 series a demonstrates SUV max = 3.7. No evidence for hypermetabolic left lung nodule.  Heart size upper normal. No pericardial effusion. Coronary artery calcification is evident.  ABDOMEN/PELVIS  No evidence for hypermetabolic disease in the liver or spleen. No hypermetabolic lymphadenopathy in the abdomen or pelvis. Abdominal aorta measures up to 3.8 cm in diameter, compatible with aneurysm. No adrenal nodule or mass. No intraperitoneal free fluid.  SKELETON  No focal hypermetabolic activity to suggest skeletal metastasis.  IMPRESSION: 1. Markedly hypermetabolic right lower lobe lesion with hypermetabolic metastatic disease in the right hilum and mediastinum. 2. Several scattered hypermetabolic ground-glass opacities in the right upper lobe. FDG accumulation within these nodules is higher than typically seen for infectious/inflammatory etiology although this remains within the differential. Atypical appearance of metastatic spread would also be a consideration. 3. Coronary artery atherosclerosis. 4. **An incidental finding of potential clinical significance has been found. 3.8 cm abdominal aortic aneurysm Recommend followup by  ultrasound in 2 years. This recommendation follows ACR consensus guidelines: White Paper of the ACR Incidental  Findings Committee II on Vascular Findings. J Am Coll Radiol 2013; 10:789-794.**   Electronically Signed   By: Misty Stanley M.D.   On: 07/26/2016 09:44   PATHOLOGY     ASSESSMENT & PLAN:  Small cell lung cancer, limited stage Cough Hyponatremia Tobacco Abuse SIADH headaches  Small cell carcinoma (HCC) Here today for cycle #2 of cisplatin/etoposide. Doing well. Cough is improved. She will start radiation next week. No complaints today,  Labs are reviewed with the patient in detail.    Tobacco abuse She has cut back on her smoking to 1/2 ppd. She has requested help with cessation. We discussed options. We opted for chantix, we discussed risks/benefits of chantix. She was given a prescription for a starter pack today.   SIADH (syndrome of inappropriate ADH production) (HCC) Sodium is better today. She can resume normal fluid intake. Headaches are gone. Will continue to monitor  Hypomagnesemia She will continue on magnesium. Will recheck at follow-up. Last magnesium was WNL. Leg cramps are gone. No diarrhea or problems tolerating medication  All questions were answered. The patient knows to call the clinic with any problems, questions or concerns.  This document serves as a record of services personally performed by Ancil Linsey, MD. It was created on her behalf by Elmyra Ricks, a trained medical scribe. The creation of this record is based on the scribe's personal observations and the provider's statements to them. This document has been checked and approved by the attending provider.  I have reviewed the above documentation for accuracy and completeness and I agree with the above.  This note was electronically signed.    Molli Hazard, MD  08/17/2016 9:50 AM

## 2016-08-17 NOTE — Assessment & Plan Note (Signed)
Sodium is better today. She can resume normal fluid intake. Headaches are gone. Will continue to monitor

## 2016-08-17 NOTE — Assessment & Plan Note (Signed)
Here today for cycle #2 of cisplatin/etoposide. Doing well. Cough is improved. She will start radiation next week. No complaints today,  Labs are reviewed with the patient in detail.

## 2016-08-17 NOTE — Patient Instructions (Signed)
[facility] Discharge Instructions  RECOMMENDATIONS MADE BY THE CONSULTANT AND ANY TEST RESULTS WILL BE SENT TO YOUR REFERRING PHYSICIAN.  You saw Dr. Whitney Muse today. Prescription for Chantix given. Follow up with MD in 3 weeks with lab work and chemo.  Thank you for choosing [facility] to provide your oncology and hematology care.  To afford each patient quality time with our provider, please arrive at least 15 minutes before your scheduled appointment time.   Beginning January 23rd 2017 lab work for the [facility] will be done in the  Main lab at [facility] on 1st floor. If you have a lab appointment with the [facility] please come in thru the  Main Entrance and check in at the main information desk  You need to re-schedule your appointment should you arrive 10 or more minutes late.  We strive to give you quality time with our providers, and arriving late affects you and other patients whose appointments are after yours.  Also, if you no show three or more times for appointments you may be dismissed from the clinic at the providers discretion.     Again, thank you for choosing [facility].  Our hope is that these requests will decrease the amount of time that you wait before being seen by our physicians.       _____________________________________________________________  Should you have questions after your visit to [facility], please contact our office at [phone] between the hours of 8:30 a.m. and 4:30 p.m.  Voicemails left after 4:30 p.m. will not be returned until the following business day.  For prescription refill requests, have your pharmacy contact our office.         Resources For Cancer Patients and their Caregivers ? American Cancer Society: Can assist with transportation, wigs, general needs, runs Look Good Feel Better.        (314) 110-5660 ? Cancer  Care: Provides financial assistance, online support groups, medication/co-pay assistance.  1-800-813-HOPE 782-107-2155) ? [facility] Assists [place] cancer patients and their families through emotional , educational and financial support.  224-248-2028 ? [place] DSS Where to apply for food stamps, Medicaid and utility assistance. (620) 647-0253 ? [facility]: Transportation to medical appointments. 613-553-2809 ? Social Security Administration: May apply for disability if have a Stage IV cancer. 2035095441 734-839-2569 ? [employer], Disability and Transit Services: Assists with nutrition, care and transit needs. [facility] Support Programs: '@10RELATIVEDAYS'$ @ > Cancer Support Group  2nd Tuesday of the month 1pm-2pm, Journey Room  > Creative Journey  3rd Tuesday of the month 1130am-1pm, Journey Room  > Look Good Feel Better  1st Wednesday of the month 10am-12 noon, Journey Room (Call [facility] to register [phone])

## 2016-08-17 NOTE — Assessment & Plan Note (Signed)
She has cut back on her smoking to 1/2 ppd. She has requested help with cessation. We discussed options. We opted for chantix, we discussed risks/benefits of chantix. She was given a prescription for a starter pack today.

## 2016-08-17 NOTE — Progress Notes (Signed)
Chemotherapy given today per orders. Vitals stable, no problems with chemo. Discharged ambulatory with daughter from clinic.

## 2016-08-18 ENCOUNTER — Encounter (HOSPITAL_COMMUNITY): Payer: Self-pay | Admitting: Oncology

## 2016-08-18 ENCOUNTER — Encounter (HOSPITAL_BASED_OUTPATIENT_CLINIC_OR_DEPARTMENT_OTHER): Payer: Managed Care, Other (non HMO)

## 2016-08-18 ENCOUNTER — Encounter (HOSPITAL_COMMUNITY): Payer: Self-pay

## 2016-08-18 VITALS — BP 151/77 | HR 79 | Temp 97.6°F | Resp 18 | Wt 144.2 lb

## 2016-08-18 DIAGNOSIS — Z5111 Encounter for antineoplastic chemotherapy: Secondary | ICD-10-CM

## 2016-08-18 DIAGNOSIS — C801 Malignant (primary) neoplasm, unspecified: Secondary | ICD-10-CM

## 2016-08-18 DIAGNOSIS — C3431 Malignant neoplasm of lower lobe, right bronchus or lung: Secondary | ICD-10-CM | POA: Diagnosis not present

## 2016-08-18 MED ORDER — HEPARIN SOD (PORK) LOCK FLUSH 100 UNIT/ML IV SOLN
500.0000 [IU] | Freq: Once | INTRAVENOUS | Status: AC | PRN
  Administered 2016-08-18: 500 [IU]

## 2016-08-18 MED ORDER — SODIUM CHLORIDE 0.9 % IV SOLN
10.0000 mg | Freq: Once | INTRAVENOUS | Status: AC
  Administered 2016-08-18: 10 mg via INTRAVENOUS
  Filled 2016-08-18: qty 1

## 2016-08-18 MED ORDER — SODIUM CHLORIDE 0.9% FLUSH
10.0000 mL | INTRAVENOUS | Status: DC | PRN
  Administered 2016-08-18: 10 mL
  Filled 2016-08-18: qty 10

## 2016-08-18 MED ORDER — HEPARIN SOD (PORK) LOCK FLUSH 100 UNIT/ML IV SOLN
INTRAVENOUS | Status: AC
  Filled 2016-08-18: qty 5

## 2016-08-18 MED ORDER — SODIUM CHLORIDE 0.9 % IV SOLN
Freq: Once | INTRAVENOUS | Status: AC
  Administered 2016-08-18: 12:00:00 via INTRAVENOUS

## 2016-08-18 MED ORDER — SODIUM CHLORIDE 0.9 % IV SOLN
100.0000 mg/m2 | Freq: Once | INTRAVENOUS | Status: AC
  Administered 2016-08-18: 170 mg via INTRAVENOUS
  Filled 2016-08-18: qty 8.5

## 2016-08-18 NOTE — Patient Instructions (Signed)
[facility] Discharge Instructions for Patients Receiving Chemotherapy   Beginning January 23rd 2017 lab work for the [facility] will be done in the  Main lab at [facility] on 1st floor. If you have a lab appointment with the [facility] please come in thru the  Main Entrance and check in at the main information desk   Today you received the following chemotherapy agents etoposide  To help prevent nausea and vomiting after your treatment, we encourage you to take your nausea medication     If you develop nausea and vomiting, or diarrhea that is not controlled by your medication, call the clinic.  The clinic phone number is [phone]. Office hours are Monday-Friday 8:30am-5:00pm.  BELOW ARE SYMPTOMS THAT SHOULD BE REPORTED IMMEDIATELY:  *FEVER GREATER THAN 101.0 F  *CHILLS WITH OR WITHOUT FEVER  NAUSEA AND VOMITING THAT IS NOT CONTROLLED WITH YOUR NAUSEA MEDICATION  *UNUSUAL SHORTNESS OF BREATH  *UNUSUAL BRUISING OR BLEEDING  TENDERNESS IN MOUTH AND THROAT WITH OR WITHOUT PRESENCE OF ULCERS  *URINARY PROBLEMS  *BOWEL PROBLEMS  UNUSUAL RASH Items with * indicate a potential emergency and should be followed up as soon as possible. If you have an emergency after office hours please contact your primary care physician or go to the nearest emergency department.  Please call the clinic during office hours if you have any questions or concerns.   You may also contact the Patient Navigator at [phone] should you have any questions or need assistance in obtaining follow up care.      Resources For Cancer Patients and their Caregivers ? American Cancer Society: Can assist with transportation, wigs, general needs, runs Look Good Feel Better.        705-693-8989 ? Cancer Care: Provides financial assistance, online support groups, medication/co-pay assistance.  1-800-813-HOPE 208-398-8705) ? [facility] Assists [place]  cancer patients and their families through emotional , educational and financial support.  (313)116-1250 ? [place] DSS Where to apply for food stamps, Medicaid and utility assistance. (845) 423-6437 ? RCATS: Transportation to medical appointments. 610-187-0922 ? Social Security Administration: May apply for disability if have a Stage IV cancer. 212-131-2094 (737)241-0317 ? [employer], Disability and Transit Services: Assists with nutrition, care and transit needs. 4755863673

## 2016-08-18 NOTE — Progress Notes (Signed)
Chemotherapy given today per orders, tolerated well without problems. Vitals stable, discharged from clinic ambulatory with son.

## 2016-08-19 ENCOUNTER — Encounter (HOSPITAL_BASED_OUTPATIENT_CLINIC_OR_DEPARTMENT_OTHER): Payer: Managed Care, Other (non HMO)

## 2016-08-19 VITALS — BP 126/80 | HR 76 | Temp 98.0°F | Resp 18

## 2016-08-19 DIAGNOSIS — C801 Malignant (primary) neoplasm, unspecified: Secondary | ICD-10-CM

## 2016-08-19 DIAGNOSIS — Z5111 Encounter for antineoplastic chemotherapy: Secondary | ICD-10-CM | POA: Diagnosis not present

## 2016-08-19 DIAGNOSIS — C3431 Malignant neoplasm of lower lobe, right bronchus or lung: Secondary | ICD-10-CM | POA: Diagnosis not present

## 2016-08-19 MED ORDER — SODIUM CHLORIDE 0.9% FLUSH
10.0000 mL | INTRAVENOUS | Status: DC | PRN
  Administered 2016-08-19: 10 mL
  Filled 2016-08-19: qty 10

## 2016-08-19 MED ORDER — SODIUM CHLORIDE 0.9 % IV SOLN
100.0000 mg/m2 | Freq: Once | INTRAVENOUS | Status: AC
  Administered 2016-08-19: 170 mg via INTRAVENOUS
  Filled 2016-08-19: qty 8.5

## 2016-08-19 MED ORDER — SODIUM CHLORIDE 0.9 % IV SOLN
Freq: Once | INTRAVENOUS | Status: AC
  Administered 2016-08-19: 12:00:00 via INTRAVENOUS

## 2016-08-19 MED ORDER — PEGFILGRASTIM 6 MG/0.6ML ~~LOC~~ PSKT
6.0000 mg | PREFILLED_SYRINGE | Freq: Once | SUBCUTANEOUS | Status: AC
  Administered 2016-08-19: 6 mg via SUBCUTANEOUS
  Filled 2016-08-19: qty 0.6

## 2016-08-19 MED ORDER — SODIUM CHLORIDE 0.9 % IV SOLN
10.0000 mg | Freq: Once | INTRAVENOUS | Status: AC
  Administered 2016-08-19: 10 mg via INTRAVENOUS
  Filled 2016-08-19: qty 1

## 2016-08-19 MED ORDER — HEPARIN SOD (PORK) LOCK FLUSH 100 UNIT/ML IV SOLN
500.0000 [IU] | Freq: Once | INTRAVENOUS | Status: AC | PRN
  Administered 2016-08-19: 500 [IU]
  Filled 2016-08-19: qty 5

## 2016-08-19 NOTE — Patient Instructions (Signed)
[facility] Discharge Instructions for Patients Receiving Chemotherapy   Beginning January 23rd 2017 lab work for the [facility] will be done in the  Main lab at [facility] on 1st floor. If you have a lab appointment with the [facility] please come in thru the  Main Entrance and check in at the main information desk   Today you received the following chemotherapy agents VP-16 as well as Neulasta on-pro. Follow-up as scheduled. Call clinic for any questions or concerns  To help prevent nausea and vomiting after your treatment, we encourage you to take your nausea medication   If you develop nausea and vomiting, or diarrhea that is not controlled by your medication, call the clinic.  The clinic phone number is [phone]. Office hours are Monday-Friday 8:30am-5:00pm.  BELOW ARE SYMPTOMS THAT SHOULD BE REPORTED IMMEDIATELY:  *FEVER GREATER THAN 101.0 F  *CHILLS WITH OR WITHOUT FEVER  NAUSEA AND VOMITING THAT IS NOT CONTROLLED WITH YOUR NAUSEA MEDICATION  *UNUSUAL SHORTNESS OF BREATH  *UNUSUAL BRUISING OR BLEEDING  TENDERNESS IN MOUTH AND THROAT WITH OR WITHOUT PRESENCE OF ULCERS  *URINARY PROBLEMS  *BOWEL PROBLEMS  UNUSUAL RASH Items with * indicate a potential emergency and should be followed up as soon as possible. If you have an emergency after office hours please contact your primary care physician or go to the nearest emergency department.  Please call the clinic during office hours if you have any questions or concerns.   You may also contact the Patient Navigator at [phone] should you have any questions or need assistance in obtaining follow up care.      Resources For Cancer Patients and their Caregivers ? American Cancer Society: Can assist with transportation, wigs, general needs, runs Look Good Feel Better.        541-606-2611 ? Cancer Care: Provides financial assistance, online support groups, medication/co-pay assistance.   1-800-813-HOPE 321-489-7404) ? [facility] Assists [place] cancer patients and their families through emotional , educational and financial support.  (701)365-3622 ? [place] DSS Where to apply for food stamps, Medicaid and utility assistance. (757)702-4816 ? [facility]: Transportation to medical appointments. 714-353-5625 ? Social Security Administration: May apply for disability if have a Stage IV cancer. 920-578-8405 (810) 266-7143 ? [employer], Disability and Transit Services: Assists with nutrition, care and transit needs. 314-413-8539

## 2016-08-19 NOTE — Progress Notes (Signed)
Lydia Moore tolerated chemo tx and Neulasta on-pro well without complaints or incident. Neulasta  Applied to right arm at 1415 PM and can be removed tomorrow at 1815 PM. VSS upon discharge. Port flushed per protocol and de-accessed. Pt discharged self ambulatory in satisfactory condition with daughter

## 2016-09-07 ENCOUNTER — Encounter (HOSPITAL_COMMUNITY): Payer: Managed Care, Other (non HMO) | Attending: Oncology

## 2016-09-07 ENCOUNTER — Encounter (HOSPITAL_BASED_OUTPATIENT_CLINIC_OR_DEPARTMENT_OTHER): Payer: Managed Care, Other (non HMO) | Admitting: Oncology

## 2016-09-07 ENCOUNTER — Encounter (HOSPITAL_COMMUNITY): Payer: Self-pay | Admitting: Oncology

## 2016-09-07 VITALS — BP 149/83 | HR 88 | Temp 98.4°F | Resp 18 | Wt 149.6 lb

## 2016-09-07 DIAGNOSIS — C801 Malignant (primary) neoplasm, unspecified: Secondary | ICD-10-CM | POA: Diagnosis present

## 2016-09-07 DIAGNOSIS — C3431 Malignant neoplasm of lower lobe, right bronchus or lung: Secondary | ICD-10-CM

## 2016-09-07 DIAGNOSIS — Z5111 Encounter for antineoplastic chemotherapy: Secondary | ICD-10-CM | POA: Diagnosis not present

## 2016-09-07 DIAGNOSIS — Z720 Tobacco use: Secondary | ICD-10-CM | POA: Diagnosis not present

## 2016-09-07 DIAGNOSIS — E871 Hypo-osmolality and hyponatremia: Secondary | ICD-10-CM | POA: Diagnosis not present

## 2016-09-07 LAB — CBC WITH DIFFERENTIAL/PLATELET
BASOS PCT: 0 %
Basophils Absolute: 0 10*3/uL (ref 0.0–0.1)
EOS ABS: 0.1 10*3/uL (ref 0.0–0.7)
EOS PCT: 1 %
HCT: 35 % — ABNORMAL LOW (ref 36.0–46.0)
Hemoglobin: 12.1 g/dL (ref 12.0–15.0)
LYMPHS ABS: 0.8 10*3/uL (ref 0.7–4.0)
Lymphocytes Relative: 8 %
MCH: 31.6 pg (ref 26.0–34.0)
MCHC: 34.6 g/dL (ref 30.0–36.0)
MCV: 91.4 fL (ref 78.0–100.0)
Monocytes Absolute: 0.7 10*3/uL (ref 0.1–1.0)
Monocytes Relative: 6 %
Neutro Abs: 9 10*3/uL — ABNORMAL HIGH (ref 1.7–7.7)
Neutrophils Relative %: 85 %
PLATELETS: 114 10*3/uL — AB (ref 150–400)
RBC: 3.83 MIL/uL — AB (ref 3.87–5.11)
RDW: 15.5 % (ref 11.5–15.5)
WBC: 10.6 10*3/uL — AB (ref 4.0–10.5)

## 2016-09-07 LAB — COMPREHENSIVE METABOLIC PANEL
ALK PHOS: 99 U/L (ref 38–126)
ALT: 31 U/L (ref 14–54)
AST: 33 U/L (ref 15–41)
Albumin: 3.9 g/dL (ref 3.5–5.0)
Anion gap: 8 (ref 5–15)
BILIRUBIN TOTAL: 0.6 mg/dL (ref 0.3–1.2)
BUN: 14 mg/dL (ref 6–20)
CALCIUM: 8.8 mg/dL — AB (ref 8.9–10.3)
CO2: 26 mmol/L (ref 22–32)
CREATININE: 0.6 mg/dL (ref 0.44–1.00)
Chloride: 91 mmol/L — ABNORMAL LOW (ref 101–111)
GFR calc Af Amer: >60 mL/min (ref >60)
Glucose, Bld: 127 mg/dL — ABNORMAL HIGH (ref 65–99)
Potassium: 4.1 mmol/L (ref 3.5–5.1)
Sodium: 125 mmol/L — ABNORMAL LOW (ref 135–145)
TOTAL PROTEIN: 6.3 g/dL — AB (ref 6.5–8.1)

## 2016-09-07 LAB — MAGNESIUM: MAGNESIUM: 1.9 mg/dL (ref 1.7–2.4)

## 2016-09-07 MED ORDER — ETOPOSIDE CHEMO INJECTION 1 GM/50ML
100.0000 mg/m2 | Freq: Once | INTRAVENOUS | Status: AC
  Administered 2016-09-07: 170 mg via INTRAVENOUS
  Filled 2016-09-07: qty 8.5

## 2016-09-07 MED ORDER — PALONOSETRON HCL INJECTION 0.25 MG/5ML
0.2500 mg | Freq: Once | INTRAVENOUS | Status: AC
  Administered 2016-09-07: 0.25 mg via INTRAVENOUS
  Filled 2016-09-07: qty 5

## 2016-09-07 MED ORDER — POTASSIUM CHLORIDE 2 MEQ/ML IV SOLN
Freq: Once | INTRAVENOUS | Status: AC
  Administered 2016-09-07: 09:00:00 via INTRAVENOUS
  Filled 2016-09-07: qty 10

## 2016-09-07 MED ORDER — SODIUM CHLORIDE 0.9% FLUSH
10.0000 mL | INTRAVENOUS | Status: DC | PRN
  Administered 2016-09-07: 10 mL
  Filled 2016-09-07: qty 10

## 2016-09-07 MED ORDER — SODIUM CHLORIDE 0.9 % IV SOLN
Freq: Once | INTRAVENOUS | Status: AC
  Administered 2016-09-07: 11:00:00 via INTRAVENOUS

## 2016-09-07 MED ORDER — HEPARIN SOD (PORK) LOCK FLUSH 100 UNIT/ML IV SOLN
500.0000 [IU] | Freq: Once | INTRAVENOUS | Status: AC | PRN
  Administered 2016-09-07: 500 [IU]
  Filled 2016-09-07: qty 5

## 2016-09-07 MED ORDER — SODIUM CHLORIDE 0.9 % IV SOLN
Freq: Once | INTRAVENOUS | Status: AC
  Administered 2016-09-07: 11:00:00 via INTRAVENOUS
  Filled 2016-09-07: qty 5

## 2016-09-07 MED ORDER — SODIUM CHLORIDE 0.9 % IV SOLN
80.0000 mg/m2 | Freq: Once | INTRAVENOUS | Status: AC
  Administered 2016-09-07: 138 mg via INTRAVENOUS
  Filled 2016-09-07: qty 100

## 2016-09-07 NOTE — Patient Instructions (Signed)
[facility] Discharge Instructions for Patients Receiving Chemotherapy   Beginning January 23rd 2017 lab work for the [facility] will be done in the  Main lab at [facility] on 1st floor. If you have a lab appointment with the [facility] please come in thru the  Main Entrance and check in at the main information desk   Today you received the following chemotherapy agents Cisplatin and VP-16. Follow-up as scheduled. Call clinic for any questions or concerns  To help prevent nausea and vomiting after your treatment, we encourage you to take your nausea medication   If you develop nausea and vomiting, or diarrhea that is not controlled by your medication, call the clinic.  The clinic phone number is [phone]. Office hours are Monday-Friday 8:30am-5:00pm.  BELOW ARE SYMPTOMS THAT SHOULD BE REPORTED IMMEDIATELY:  *FEVER GREATER THAN 101.0 F  *CHILLS WITH OR WITHOUT FEVER  NAUSEA AND VOMITING THAT IS NOT CONTROLLED WITH YOUR NAUSEA MEDICATION  *UNUSUAL SHORTNESS OF BREATH  *UNUSUAL BRUISING OR BLEEDING  TENDERNESS IN MOUTH AND THROAT WITH OR WITHOUT PRESENCE OF ULCERS  *URINARY PROBLEMS  *BOWEL PROBLEMS  UNUSUAL RASH Items with * indicate a potential emergency and should be followed up as soon as possible. If you have an emergency after office hours please contact your primary care physician or go to the nearest emergency department.  Please call the clinic during office hours if you have any questions or concerns.   You may also contact the Patient Navigator at [phone] should you have any questions or need assistance in obtaining follow up care.      Resources For Cancer Patients and their Caregivers ? American Cancer Society: Can assist with transportation, wigs, general needs, runs Look Good Feel Better.        ([phone] 052-3757 ? Cancer Care: Provides financial assistance, online support groups, medication/co-pay assistance.   1-800-813-HOPE 510-440-9928) ? [facility] Assists [place] cancer patients and their families through emotional , educational and financial support.  5862340791 ? [place] DSS Where to apply for food stamps, Medicaid and utility assistance. (323)675-4914 ? RCATS: Transportation to medical appointments. 706-162-5072 ? Social Security Administration: May apply for disability if have a Stage IV cancer. 707-702-8168 579-552-6159 ? [employer], Disability and Transit Services: Assists with nutrition, care and transit needs. [phone]

## 2016-09-07 NOTE — Progress Notes (Signed)
Lydia Moore tolerated chemo tx well without complaints or incident. Labs reviewed with Kirby Crigler PA prior to administering chemotherapy. VSS upon discharge.Port left accessed after being flushed per protocol and saline locked.Pt discharged self ambulatory in satisfactory condition

## 2016-09-07 NOTE — Patient Instructions (Signed)
[facility] Discharge Instructions  RECOMMENDATIONS MADE BY THE CONSULTANT AND ANY TEST RESULTS WILL BE SENT TO YOUR REFERRING PHYSICIAN.  You saw Tom Kefalas, PA-C, today.  Thank you for choosing [facility] at [facility] to provide your oncology and hematology care.  To afford each patient quality time with our provider, please arrive at least 15 minutes before your scheduled appointment time.   Beginning January 23rd 2017 lab work for the Cancer Center will be done in the  Main lab at [place] on 1st floor. If you have a lab appointment with the Cancer Center please come in thru the  Main Entrance and check in at the main information desk  You need to re-schedule your appointment should you arrive 10 or more minutes late.  We strive to give you quality time with our providers, and arriving late affects you and other patients whose appointments are after yours.  Also, if you no show three or more times for appointments you may be dismissed from the clinic at the providers discretion.     Again, thank you for choosing [facility].  Our hope is that these requests will decrease the amount of time that you wait before being seen by our physicians.       _____________________________________________________________  Should you have questions after your visit to [facility], please contact our office at [phone] between the hours of 8:30 a.m. and 4:30 p.m.  Voicemails left after 4:30 p.m. will not be returned until the following business day.  For prescription refill requests, have your pharmacy contact our office.         Resources For Cancer Patients and their Caregivers ? American Cancer Society: Can assist with transportation, wigs, general needs, runs Look Good Feel Better.        1-888-227-6333 ? Cancer Care: Provides financial assistance, online support groups, medication/co-pay assistance.   1-800-813-HOPE (4673) ? [facility] Assists [place] cancer patients and their families through emotional , educational and financial support.  336-427-4357 ? [place] DSS Where to apply for food stamps, Medicaid and utility assistance. 336-342-1394 ? RCATS: Transportation to medical appointments. 336-347-2287 ? Social Security Administration: May apply for disability if have a Stage IV cancer. 336-342-7796 1-800-772-1213 ? [place] Aging, Disability and Transit Services: Assists with nutrition, care and transit needs. 336-349-2343  Cancer Center Support Programs: [email or web address]@ > Cancer Support Group  2nd Tuesday of the month 1pm-2pm, Journey Room  > Creative Journey  3rd Tuesday of the month 1130am-1pm, Journey Room  > Look Good Feel Better  1st Wednesday of the month 10am-12 noon, Journey Room (Call American Cancer Society to register 1-800-395-5775)    

## 2016-09-07 NOTE — Assessment & Plan Note (Addendum)
Small cell lung cancer, limited stage, presenting with SIADH.  For patients with LS-SCLC treated with contemporary chemoradiotherapy and prophylactic cranial irradiation, overall response rates of 80 to 90 percent, including 50 to 60 percent complete response rates, are typically reported. Median survival is around 17 months, and the five-year survival rate is about 20 percent.  Pre-treatment labs: CBC diff, CMET.  I personally reviewed and went over laboratory results with the patient.  The results are noted within this dictation.  Labs satisfy treatment parameters.  Hyponatremia is noted.  She is tolerating treatment well thus far.  She has seen Dr. Sondra Come (Radiation Oncology).  He plans on revisiting the role of XRT following 4 cycles of chemotherapy with repeat imaging.   From a radiation oncology perspective, there is concern for the amount of possible radiation-induced damage to lung field given the size and location of her primary tumor.  Therefore, by waiting and re-evaluating following 3 cycle of chemotherapy, the radiation field may be smaller and salvage more of the patient's lung.  Order is placed for CT of chest w contrast in ~ 3 weeks.  Return in 3 weeks for follow-up.

## 2016-09-07 NOTE — Progress Notes (Signed)
[place] [id number]  Small cell carcinoma [facility]) - Plan: CT Chest W Contrast, CANCELED: CT Chest W Contrast  CURRENT THERAPY: Cisplatin/Etoposide.  INTERVAL HISTORY: Lydia Moore 55 y.o. female returns for followup of Small cell lung cancer, limited stage, presenting with SIADH.     Small cell carcinoma ([place])   07/12/2016 Imaging    CTA chest 3.9 x 3.5 cm right lower lobe mass with imaging features most compatible with a primary lung carcinoma.Marked metastatic right hilar and mediastinal adenopathy including a confluent mass of adenopathy in the right hilum, encasing and causing marked narrowing of the central pulmonary arteries on the right with a short segment of occlusion of the right lower lobe bronchus.Mild postobstructive changes in the inferior, medial right lower lobe.Mild changes of COPD       07/16/2016 Procedure    Endoscopic bronchoscopy with transbronchial biopsy of #7 nodes and biopsy of RLL lung mass      07/16/2016 Pathology Results    Lung, biopsy, Right Lower Lobe - SMALL CELL CARCINOMA.       07/21/2016 Imaging    MRI brain Negative MRI of the brain. No evidence for metastatic disease to the brain or meninges.      07/26/2016 PET scan    Markedly hypermetabolic right lower lobe lesion with hypermetabolic metastatic disease in the right hilum and mediastinum. 2. Several scattered hypermetabolic ground-glass opacities in the right upper lobe. FDG accumulation within these nodules is higher than typically seen for infectious/inflammatory etiology although this remains within the differential. Atypical appearance of metastatic spread would also be a consideration. 3. Coronary artery atherosclerosis. 4. **An incidental finding of potential clinical significance has been found. 3.8 cm abdominal aortic aneurysm       07/27/2016 -  Chemotherapy    The patient had palonosetron (ALOXI) injection 0.25 mg, 0.25 mg,  Intravenous,  Once, 1 of 4 cycles  pegfilgrastim (NEULASTA ONPRO KIT) injection 6 mg, 6 mg, Subcutaneous,  Once, 1 of 1 cycle  pegfilgrastim (NEULASTA ONPRO KIT) injection 6 mg, 6 mg, Subcutaneous, Once, 0 of 3 cycles  CISplatin (PLATINOL) 138 mg in sodium chloride 0.9 % 500 mL chemo infusion, 80 mg/m2 = 138 mg, Intravenous,  Once, 1 of 4 cycles  etoposide (VEPESID) 170 mg in sodium chloride 0.9 % 500 mL chemo infusion, 100 mg/m2 = 170 mg, Intravenous,  Once, 1 of 4 cycles  fosaprepitant (EMEND) 150 mg, dexamethasone (DECADRON) 12 mg in sodium chloride 0.9 % 145 mL IVPB, , Intravenous,  Once, 1 of 4 cycles  for chemotherapy treatment.  Cisplatin/Etoposide       Se is doing very well.  She is tolerating treatment well.  She notes 1 episode of vomiting but she reports that it was this morning after eating breakfast.  She thinks it was secondary to what she ate.  She denies any other instance of nausea with vomiting.  She notes fatigue beginning 24 hours post-treatment that lasts ~ 3 days.  It then resolves.  Review of Systems  Constitutional: Negative.  Negative for chills, fever and weight loss.  HENT: Negative.   Eyes: Negative.  Negative for blurred vision and double vision.  Respiratory: Negative.  Negative for cough.   Cardiovascular: Negative.  Negative for chest pain.  Gastrointestinal: Positive for vomiting. Negative for constipation, diarrhea and nausea.  Genitourinary: Negative.   Musculoskeletal: Negative.   Skin: Negative.   Neurological: Negative.   Endo/Heme/Allergies:  Negative.   Psychiatric/Behavioral: Negative.     Past Medical History:  Diagnosis Date  . Elevated cholesterol   . GERD (gastroesophageal reflux disease)   . GI bleed   . Hypertension   . Small cell carcinoma ([place]) 07/21/2016    Past Surgical History:  Procedure Laterality Date  . ABDOMINAL HYSTERECTOMY     partial  . CESAREAN SECTION    . COLONOSCOPY    . PORTACATH PLACEMENT Left 08/04/2016    Procedure: INSERTION PORT-A-CATH LEFT SUBCLAVIAN;  Surgeon: Aviva Signs, MD;  Location: [facility];  Service: General;  Laterality: Left;  . TONSILLECTOMY    . VIDEO BRONCHOSCOPY WITH ENDOBRONCHIAL ULTRASOUND N/A 07/16/2016   Procedure: VIDEO BRONCHOSCOPY WITH ENDOBRONCHIAL ULTRASOUND with ultrasound transbronchial biopsy of node #7 and right lower lobe lesion;  Surgeon: Grace Isaac, MD;  Location: [facility] OR;  Service: Thoracic;  Laterality: N/A;    Family History  Problem Relation Age of Onset  . Heart disease Mother   . Heart disease Father   . COPD Sister   . HIV Brother     Social History   Social History  . Marital status: Married    Spouse name: N/A  . Number of children: 4  . Years of education: N/A   Occupational History  . [employer]   Social History Main Topics  . Smoking status: Current Every Day Smoker    Packs/day: 0.50    Years: 35.00    Types: Cigarettes  . Smokeless tobacco: Never Used  . Alcohol use Yes     Comment: occasionally, Couple drinks per mo  . Drug use:     Frequency: 1.0 time per week    Types: Marijuana     Comment: weekly  . Sexual activity: Yes    Birth control/ protection: Surgical     Comment: married   Other Topics Concern  . None   Social History Narrative   Lives w/ husband     PHYSICAL EXAMINATION  ECOG PERFORMANCE STATUS: 0 - Asymptomatic  There were no vitals filed for this visit.  Vitals - 1 value per visit 16/08/9603  SYSTOLIC 540  DIASTOLIC 76  Pulse 63  Temperature 98.1  Respirations 18    GENERAL:alert, no distress, well nourished, well developed, comfortable, cooperative, smiling and in chemo-recliner, unaccompanied. SKIN: skin color, texture, turgor are normal, no rashes or significant lesions HEAD: Normocephalic, No masses, lesions, tenderness or abnormalities EYES: normal, EOMI, Conjunctiva are pink and non-injected EARS: External ears normal OROPHARYNX:lips, buccal mucosa, and tongue normal  and mucous membranes are moist  NECK: supple, trachea midline LYMPH:  not examined BREAST:not examined LUNGS: clear to auscultation and percussion HEART: regular rate & rhythm, no murmurs and no gallops ABDOMEN:abdomen soft, obese and normal bowel sounds BACK: Back symmetric, no curvature. EXTREMITIES:less then 2 second capillary refill, no joint deformities, effusion, or inflammation, no skin discoloration, no cyanosis  NEURO: alert & oriented x 3 with fluent speech, no focal motor/sensory deficits, gait normal   LABORATORY DATA: CBC    Component Value Date/Time   WBC 10.6 (H) 09/07/2016 0836   RBC 3.83 (L) 09/07/2016 0836   HGB 12.1 09/07/2016 0836   HCT 35.0 (L) 09/07/2016 0836   PLT 114 (L) 09/07/2016 0836   MCV 91.4 09/07/2016 0836   MCH 31.6 09/07/2016 0836   MCHC 34.6 09/07/2016 0836   RDW 15.5 09/07/2016 0836   LYMPHSABS 0.8 09/07/2016 0836   MONOABS 0.7 09/07/2016 0836   EOSABS 0.1 09/07/2016  0836   BASOSABS 0.0 09/07/2016 0836      Chemistry      Component Value Date/Time   NA 125 (L) 09/07/2016 0836   K 4.1 09/07/2016 0836   CL 91 (L) 09/07/2016 0836   CO2 26 09/07/2016 0836   BUN 14 09/07/2016 0836   CREATININE 0.60 09/07/2016 0836      Component Value Date/Time   CALCIUM 8.8 (L) 09/07/2016 0836   ALKPHOS 99 09/07/2016 0836   AST 33 09/07/2016 0836   ALT 31 09/07/2016 0836   BILITOT 0.6 09/07/2016 0836        PENDING LABS:   RADIOGRAPHIC STUDIES:  No results found.   PATHOLOGY:    ASSESSMENT AND PLAN:  Small cell carcinoma (H[facility]) Small cell lung cancer, limited stage, presenting with SIADH.  For patients with LS-SCLC treated with contemporary chemoradiotherapy and prophylactic cranial irradiation, overall response rates of 80 to 90 percent, including 50 to 60 percent complete response rates, are typically reported. Median survival is around 17 months, and the five-year survival rate is about 20 percent.  Pre-treatment labs: CBC diff,  CMET.  I personally reviewed and went over laboratory results with the patient.  The results are noted within this dictation.  Labs satisfy treatment parameters.  Hyponatremia is noted.  She is tolerating treatment well thus far.  She has seen Dr. Sondra Come (Radiation Oncology).  He plans on revisiting the role of XRT following 4 cycles of chemotherapy with repeat imaging.   From a radiation oncology perspective, there is concern for the amount of possible radiation-induced damage to lung field given the size and location of her primary tumor.  Therefore, by waiting and re-evaluating following 3 cycle of chemotherapy, the radiation field may be smaller and salvage more of the patient's lung.  Order is placed for CT of chest w contrast in ~ 3 weeks.  Return in 3 weeks for follow-up.    ORDERS PLACED FOR THIS ENCOUNTER: Orders Placed This Encounter  Procedures  . CT Chest W Contrast    MEDICATIONS PRESCRIBED THIS ENCOUNTER: No orders of the defined types were placed in this encounter.   THERAPY PLAN:  Continue with treatment as planned.  We will perform restaging CT scan prior to cycle #4 to help radiation oncology plan for concurrent XRT.  All questions were answered. The patient knows to call the clinic with any problems, questions or concerns. We can certainly see the patient much sooner if necessary.  Patient and plan discussed with Dr. Ancil Linsey and she is in agreement with the aforementioned.   This note is electronically signed by: Doy Mince 09/07/2016 12:19 PM

## 2016-09-08 ENCOUNTER — Encounter (HOSPITAL_BASED_OUTPATIENT_CLINIC_OR_DEPARTMENT_OTHER): Payer: Managed Care, Other (non HMO)

## 2016-09-08 VITALS — BP 151/93 | HR 82 | Temp 98.6°F | Resp 16 | Wt 150.0 lb

## 2016-09-08 DIAGNOSIS — Z5111 Encounter for antineoplastic chemotherapy: Secondary | ICD-10-CM | POA: Diagnosis not present

## 2016-09-08 DIAGNOSIS — C801 Malignant (primary) neoplasm, unspecified: Secondary | ICD-10-CM

## 2016-09-08 DIAGNOSIS — C3431 Malignant neoplasm of lower lobe, right bronchus or lung: Secondary | ICD-10-CM | POA: Diagnosis not present

## 2016-09-08 MED ORDER — LORAZEPAM 2 MG/ML IJ SOLN
0.5000 mg | Freq: Once | INTRAMUSCULAR | Status: AC
  Administered 2016-09-08: 0.5 mg via INTRAVENOUS

## 2016-09-08 MED ORDER — SODIUM CHLORIDE 0.9 % IV SOLN
100.0000 mg/m2 | Freq: Once | INTRAVENOUS | Status: AC
  Administered 2016-09-08: 170 mg via INTRAVENOUS
  Filled 2016-09-08: qty 8.5

## 2016-09-08 MED ORDER — SODIUM CHLORIDE 0.9% FLUSH
10.0000 mL | INTRAVENOUS | Status: DC | PRN
  Administered 2016-09-08: 10 mL
  Filled 2016-09-08: qty 10

## 2016-09-08 MED ORDER — DEXAMETHASONE SODIUM PHOSPHATE 10 MG/ML IJ SOLN
10.0000 mg | Freq: Once | INTRAMUSCULAR | Status: AC
  Administered 2016-09-08: 10 mg via INTRAVENOUS
  Filled 2016-09-08: qty 1

## 2016-09-08 MED ORDER — LORAZEPAM 2 MG/ML IJ SOLN
INTRAMUSCULAR | Status: AC
  Filled 2016-09-08: qty 1

## 2016-09-08 MED ORDER — SODIUM CHLORIDE 0.9 % IV SOLN: 10.0000 mg | Freq: Once | INTRAVENOUS | Status: DC

## 2016-09-08 MED ORDER — HEPARIN SOD (PORK) LOCK FLUSH 100 UNIT/ML IV SOLN
500.0000 [IU] | Freq: Once | INTRAVENOUS | Status: AC | PRN
  Administered 2016-09-08: 500 [IU]
  Filled 2016-09-08: qty 5

## 2016-09-08 MED ORDER — SODIUM CHLORIDE 0.9 % IV SOLN
Freq: Once | INTRAVENOUS | Status: AC
  Administered 2016-09-08: 12:00:00 via INTRAVENOUS

## 2016-09-08 NOTE — Progress Notes (Signed)
[place] tolerated chemo tx well without incident. Pt did present to clinic with complaints of nausea and constipation. Last BM was 10/23. PA made aware of complaints and Ativan 0.5 mg IV ordered. Pt also given information and handout regarding constipation.VSS upon discharge and pt was feeling better.Port flushed per protocol,left accessed and saline locked for use tomorrow. Pt discharged self ambulatory in satisfactory condition with daughter

## 2016-09-08 NOTE — Patient Instructions (Signed)
[facility] Discharge Instructions for Patients Receiving Chemotherapy   Beginning January 23rd 2017 lab work for the [facility] will be done in the  Main lab at [facility] on 1st floor. If you have a lab appointment with the [facility] please come in thru the  Main Entrance and check in at the main information desk   Today you received the following chemotherapy agents VP-16.Follow-up as scheduled. Call clinic for any questions or concerns  To help prevent nausea and vomiting after your treatment, we encourage you to take your nausea medicatio   If you develop nausea and vomiting, or diarrhea that is not controlled by your medication, call the clinic.  The clinic phone number is [phone]. Office hours are Monday-Friday 8:30am-5:00pm.  BELOW ARE SYMPTOMS THAT SHOULD BE REPORTED IMMEDIATELY:  *FEVER GREATER THAN 101.0 F  *CHILLS WITH OR WITHOUT FEVER  NAUSEA AND VOMITING THAT IS NOT CONTROLLED WITH YOUR NAUSEA MEDICATION  *UNUSUAL SHORTNESS OF BREATH  *UNUSUAL BRUISING OR BLEEDING  TENDERNESS IN MOUTH AND THROAT WITH OR WITHOUT PRESENCE OF ULCERS  *URINARY PROBLEMS  *BOWEL PROBLEMS  UNUSUAL RASH Items with * indicate a potential emergency and should be followed up as soon as possible. If you have an emergency after office hours please contact your primary care physician or go to the nearest emergency department.  Please call the clinic during office hours if you have any questions or concerns.   You may also contact the Patient Navigator at [phone] should you have any questions or need assistance in obtaining follow up care.      Resources For Cancer Patients and their Caregivers ? American Cancer Society: Can assist with transportation, wigs, general needs, runs Look Good Feel Better.        ([phone] 546-1998 ? Cancer Care: Provides financial assistance, online support groups, medication/co-pay assistance.  1-800-813-HOPE  (279) 715-6255) ? [facility] Assists [place] cancer patients and their families through emotional , educational and financial support.  312-752-3492 ? [place] DSS Where to apply for food stamps, Medicaid and utility assistance. (760)317-8637 ? [employer]: Transportation to medical appointments. (502) 427-9647 ? Social Security Administration: May apply for disability if have a Stage IV cancer. 347-360-7019 [phone] ? [employer], Disability and Transit Services: Assists with nutrition, care and transit needs. [phone]

## 2016-09-09 ENCOUNTER — Encounter (HOSPITAL_COMMUNITY): Payer: Self-pay

## 2016-09-09 ENCOUNTER — Encounter (HOSPITAL_BASED_OUTPATIENT_CLINIC_OR_DEPARTMENT_OTHER): Payer: Managed Care, Other (non HMO)

## 2016-09-09 VITALS — BP 171/92 | HR 68 | Temp 98.2°F | Resp 18 | Wt 149.8 lb

## 2016-09-09 DIAGNOSIS — C3431 Malignant neoplasm of lower lobe, right bronchus or lung: Secondary | ICD-10-CM | POA: Diagnosis not present

## 2016-09-09 DIAGNOSIS — C801 Malignant (primary) neoplasm, unspecified: Secondary | ICD-10-CM

## 2016-09-09 DIAGNOSIS — Z5111 Encounter for antineoplastic chemotherapy: Secondary | ICD-10-CM

## 2016-09-09 MED ORDER — SODIUM CHLORIDE 0.9 % IV SOLN
Freq: Once | INTRAVENOUS | Status: AC
  Administered 2016-09-09: 12:00:00 via INTRAVENOUS

## 2016-09-09 MED ORDER — DEXAMETHASONE SODIUM PHOSPHATE 10 MG/ML IJ SOLN
10.0000 mg | Freq: Once | INTRAMUSCULAR | Status: AC
  Administered 2016-09-09: 10 mg via INTRAVENOUS

## 2016-09-09 MED ORDER — HEPARIN SOD (PORK) LOCK FLUSH 100 UNIT/ML IV SOLN
500.0000 [IU] | Freq: Once | INTRAVENOUS | Status: AC | PRN
  Administered 2016-09-09: 500 [IU]
  Filled 2016-09-09 (×2): qty 5

## 2016-09-09 MED ORDER — SODIUM CHLORIDE 0.9 % IV SOLN: 10.0000 mg | Freq: Once | INTRAVENOUS | Status: DC

## 2016-09-09 MED ORDER — SODIUM CHLORIDE 0.9 % IV SOLN
100.0000 mg/m2 | Freq: Once | INTRAVENOUS | Status: AC
  Administered 2016-09-09: 170 mg via INTRAVENOUS
  Filled 2016-09-09: qty 8.5

## 2016-09-09 MED ORDER — SODIUM CHLORIDE 0.9% FLUSH
10.0000 mL | INTRAVENOUS | Status: DC | PRN
  Administered 2016-09-09: 10 mL
  Filled 2016-09-09: qty 10

## 2016-09-09 MED ORDER — DEXAMETHASONE SODIUM PHOSPHATE 10 MG/ML IJ SOLN
INTRAMUSCULAR | Status: AC
  Filled 2016-09-09: qty 1

## 2016-09-09 MED ORDER — PEGFILGRASTIM 6 MG/0.6ML ~~LOC~~ PSKT
6.0000 mg | PREFILLED_SYRINGE | Freq: Once | SUBCUTANEOUS | Status: AC
  Administered 2016-09-09: 6 mg via SUBCUTANEOUS
  Filled 2016-09-09: qty 0.6

## 2016-09-09 NOTE — Patient Instructions (Signed)
[facility] Discharge Instructions for Patients Receiving Chemotherapy   Beginning January 23rd 2017 lab work for the Cancer Center will be done in the  Main lab at [place] on 1st floor. If you have a lab appointment with the Cancer Center please come in thru the  Main Entrance and check in at the main information desk   Today you received the following chemotherapy agents Etoposide  To help prevent nausea and vomiting after your treatment, we encourage you to take your nausea medication   If you develop nausea and vomiting, or diarrhea that is not controlled by your medication, call the clinic.  The clinic phone number is [phone]. Office hours are Monday-Friday 8:30am-5:00pm.  BELOW ARE SYMPTOMS THAT SHOULD BE REPORTED IMMEDIATELY:  *FEVER GREATER THAN 101.0 F  *CHILLS WITH OR WITHOUT FEVER  NAUSEA AND VOMITING THAT IS NOT CONTROLLED WITH YOUR NAUSEA MEDICATION  *UNUSUAL SHORTNESS OF BREATH  *UNUSUAL BRUISING OR BLEEDING  TENDERNESS IN MOUTH AND THROAT WITH OR WITHOUT PRESENCE OF ULCERS  *URINARY PROBLEMS  *BOWEL PROBLEMS  UNUSUAL RASH Items with * indicate a potential emergency and should be followed up as soon as possible. If you have an emergency after office hours please contact your primary care physician or go to the nearest emergency department.  Please call the clinic during office hours if you have any questions or concerns.   You may also contact the Patient Navigator at [phone] should you have any questions or need assistance in obtaining follow up care.      Resources For Cancer Patients and their Caregivers ? American Cancer Society: Can assist with transportation, wigs, general needs, runs Look Good Feel Better.        1-888-227-6333 ? Cancer Care: Provides financial assistance, online support groups, medication/co-pay assistance.  1-800-813-HOPE (4673) ? [facility] Assists [place]  cancer patients and their families through emotional , educational and financial support.  336-427-4357 ? [place] DSS Where to apply for food stamps, Medicaid and utility assistance. 336-342-1394 ? [employer]: Transportation to medical appointments. 336-347-2287 ? Social Security Administration: May apply for disability if have a Stage IV cancer. 336-342-7796 1-800-772-1213 ? [place] Aging, Disability and Transit Services: Assists with nutrition, care and transit needs. 336-349-2343          

## 2016-09-09 NOTE — Progress Notes (Signed)
Chemotherapy given today per orders. Patient tolerated it well without problems. Vitals stable today and discharged home from clinic with daughter, ambulatory. Follow up as scheduled

## 2016-09-23 ENCOUNTER — Ambulatory Visit (HOSPITAL_COMMUNITY)
Admission: RE | Admit: 2016-09-23 | Discharge: 2016-09-23 | Disposition: A | Discharge status: Home / Self Care | Payer: Managed Care, Other (non HMO) | Source: Ambulatory Visit | Attending: Hematology & Oncology | Admitting: Hematology & Oncology

## 2016-09-23 DIAGNOSIS — I2510 Atherosclerotic heart disease of native coronary artery without angina pectoris: Secondary | ICD-10-CM | POA: Insufficient documentation

## 2016-09-23 DIAGNOSIS — I700 Atherosclerosis of aorta: Secondary | ICD-10-CM | POA: Diagnosis not present

## 2016-09-23 DIAGNOSIS — R161 Splenomegaly, not elsewhere classified: Secondary | ICD-10-CM | POA: Diagnosis not present

## 2016-09-23 DIAGNOSIS — C801 Malignant (primary) neoplasm, unspecified: Secondary | ICD-10-CM | POA: Diagnosis present

## 2016-09-23 MED ORDER — IOPAMIDOL (ISOVUE-300) INJECTION 61%
75.0000 mL | Freq: Once | INTRAVENOUS | Status: AC | PRN
  Administered 2016-09-23: 75 mL via INTRAVENOUS

## 2016-09-28 ENCOUNTER — Encounter (HOSPITAL_BASED_OUTPATIENT_CLINIC_OR_DEPARTMENT_OTHER): Payer: Managed Care, Other (non HMO) | Admitting: Hematology & Oncology

## 2016-09-28 ENCOUNTER — Encounter (HOSPITAL_COMMUNITY): Payer: Self-pay | Admitting: Hematology & Oncology

## 2016-09-28 ENCOUNTER — Encounter (HOSPITAL_COMMUNITY): Payer: Self-pay

## 2016-09-28 ENCOUNTER — Encounter (HOSPITAL_COMMUNITY): Payer: Managed Care, Other (non HMO) | Attending: Hematology & Oncology

## 2016-09-28 VITALS — BP 165/93 | HR 87 | Temp 97.5°F | Resp 16 | Wt 152.6 lb

## 2016-09-28 DIAGNOSIS — E871 Hypo-osmolality and hyponatremia: Secondary | ICD-10-CM | POA: Diagnosis not present

## 2016-09-28 DIAGNOSIS — Z720 Tobacco use: Secondary | ICD-10-CM | POA: Diagnosis not present

## 2016-09-28 DIAGNOSIS — C3431 Malignant neoplasm of lower lobe, right bronchus or lung: Secondary | ICD-10-CM

## 2016-09-28 DIAGNOSIS — C801 Malignant (primary) neoplasm, unspecified: Secondary | ICD-10-CM | POA: Diagnosis present

## 2016-09-28 DIAGNOSIS — Z5111 Encounter for antineoplastic chemotherapy: Secondary | ICD-10-CM

## 2016-09-28 LAB — CBC WITH DIFFERENTIAL/PLATELET
Basophils Absolute: 0 10*3/uL (ref 0.0–0.1)
Basophils Relative: 0 %
EOS ABS: 0.1 10*3/uL (ref 0.0–0.7)
EOS PCT: 2 %
HCT: 36 % (ref 36.0–46.0)
Hemoglobin: 12.4 g/dL (ref 12.0–15.0)
LYMPHS ABS: 1 10*3/uL (ref 0.7–4.0)
LYMPHS PCT: 13 %
MCH: 32.1 pg (ref 26.0–34.0)
MCHC: 34.4 g/dL (ref 30.0–36.0)
MCV: 93.3 fL (ref 78.0–100.0)
MONOS PCT: 8 %
Monocytes Absolute: 0.6 10*3/uL (ref 0.1–1.0)
Neutro Abs: 6 10*3/uL (ref 1.7–7.7)
Neutrophils Relative %: 77 %
PLATELETS: 157 10*3/uL (ref 150–400)
RBC: 3.86 MIL/uL — ABNORMAL LOW (ref 3.87–5.11)
RDW: 17.8 % — ABNORMAL HIGH (ref 11.5–15.5)
WBC: 7.8 10*3/uL (ref 4.0–10.5)

## 2016-09-28 LAB — COMPREHENSIVE METABOLIC PANEL
ALK PHOS: 108 U/L (ref 38–126)
ALT: 23 U/L (ref 14–54)
ANION GAP: 8 (ref 5–15)
AST: 23 U/L (ref 15–41)
Albumin: 4.2 g/dL (ref 3.5–5.0)
BUN: 11 mg/dL (ref 6–20)
CALCIUM: 8.9 mg/dL (ref 8.9–10.3)
CHLORIDE: 93 mmol/L — AB (ref 101–111)
CO2: 26 mmol/L (ref 22–32)
CREATININE: 0.54 mg/dL (ref 0.44–1.00)
Glucose, Bld: 124 mg/dL — ABNORMAL HIGH (ref 65–99)
Potassium: 3.5 mmol/L (ref 3.5–5.1)
SODIUM: 127 mmol/L — AB (ref 135–145)
Total Bilirubin: 0.7 mg/dL (ref 0.3–1.2)
Total Protein: 6.7 g/dL (ref 6.5–8.1)

## 2016-09-28 LAB — MAGNESIUM: MAGNESIUM: 1.7 mg/dL (ref 1.7–2.4)

## 2016-09-28 MED ORDER — SODIUM CHLORIDE 0.9% FLUSH
10.0000 mL | INTRAVENOUS | Status: DC | PRN
  Administered 2016-09-28: 10 mL
  Filled 2016-09-28: qty 10

## 2016-09-28 MED ORDER — SODIUM CHLORIDE 0.9 % IV SOLN
80.0000 mg/m2 | Freq: Once | INTRAVENOUS | Status: AC
  Administered 2016-09-28: 138 mg via INTRAVENOUS
  Filled 2016-09-28: qty 138

## 2016-09-28 MED ORDER — ZOLPIDEM TARTRATE 10 MG PO TABS: 10.0000 mg | ORAL_TABLET | Freq: Every evening | ORAL | 2 refills | Status: DC | PRN

## 2016-09-28 MED ORDER — MAGNESIUM SULFATE 2 GM/50ML IV SOLN
2.0000 g | Freq: Once | INTRAVENOUS | Status: AC
  Administered 2016-09-28: 2 g via INTRAVENOUS
  Filled 2016-09-28: qty 50

## 2016-09-28 MED ORDER — HEPARIN SOD (PORK) LOCK FLUSH 100 UNIT/ML IV SOLN
500.0000 [IU] | Freq: Once | INTRAVENOUS | Status: AC | PRN
  Administered 2016-09-28: 500 [IU]

## 2016-09-28 MED ORDER — SODIUM CHLORIDE 0.9 % IV SOLN
Freq: Once | INTRAVENOUS | Status: AC
  Administered 2016-09-28: 11:00:00 via INTRAVENOUS
  Filled 2016-09-28: qty 5

## 2016-09-28 MED ORDER — POTASSIUM CHLORIDE 2 MEQ/ML IV SOLN
Freq: Once | INTRAVENOUS | Status: AC
  Administered 2016-09-28: 09:00:00 via INTRAVENOUS
  Filled 2016-09-28: qty 10

## 2016-09-28 MED ORDER — PALONOSETRON HCL INJECTION 0.25 MG/5ML
0.2500 mg | Freq: Once | INTRAVENOUS | Status: AC
  Administered 2016-09-28: 0.25 mg via INTRAVENOUS

## 2016-09-28 MED ORDER — PALONOSETRON HCL INJECTION 0.25 MG/5ML
INTRAVENOUS | Status: AC
  Filled 2016-09-28: qty 5

## 2016-09-28 MED ORDER — SODIUM CHLORIDE 0.9 % IV SOLN
Freq: Once | INTRAVENOUS | Status: AC
  Administered 2016-09-28: 11:00:00 via INTRAVENOUS

## 2016-09-28 MED ORDER — SODIUM CHLORIDE 0.9 % IV SOLN
100.0000 mg/m2 | Freq: Once | INTRAVENOUS | Status: AC
  Administered 2016-09-28: 170 mg via INTRAVENOUS
  Filled 2016-09-28: qty 8.5

## 2016-09-28 MED ORDER — SODIUM CHLORIDE 0.9 % IV SOLN
2.0000 g | Freq: Once | INTRAVENOUS | Status: DC
  Filled 2016-09-28: qty 4

## 2016-09-28 NOTE — Progress Notes (Signed)
Chemotherapy given today per orders. Patient tolerated well without problems. Vitals stable and discharged from clinic ambulatory from clinic with daughter.

## 2016-09-28 NOTE — Patient Instructions (Signed)
[place] at [facility] Discharge Instructions  RECOMMENDATIONS MADE BY THE CONSULTANT AND ANY TEST RESULTS WILL BE SENT TO YOUR REFERRING PHYSICIAN.  You saw Dr.Penland today. Follow up in 3 weeks with labs, chemo and MD appt. You will be scheduled for simulation at [facility] radiation oncology. Continue taking your oral magnesium. See Amy at checkout for appointments.  Thank you for choosing [place] at [facility] to provide your oncology and hematology care.  To afford each patient quality time with our provider, please arrive at least 15 minutes before your scheduled appointment time.   Beginning January 23rd 2017 lab work for the [facility] will be done in the  Main lab at [facility] on 1st floor. If you have a lab appointment with the [place] please come in thru the  Main Entrance and check in at the main information desk  You need to re-schedule your appointment should you arrive 10 or more minutes late.  We strive to give you quality time with our providers, and arriving late affects you and other patients whose appointments are after yours.  Also, if you no show three or more times for appointments you may be dismissed from the clinic at the providers discretion.     Again, thank you for choosing [facility].  Our hope is that these requests will decrease the amount of time that you wait before being seen by our physicians.       _____________________________________________________________  Should you have questions after your visit to [facility], please contact our office at [phone] between the hours of 8:30 a.m. and 4:30 p.m.  Voicemails left after 4:30 p.m. will not be returned until the following business day.  For prescription refill requests, have your pharmacy contact our office.         Resources For Cancer Patients and their Caregivers ? American Cancer Society: Can assist  with transportation, wigs, general needs, runs Look Good Feel Better.        912-362-1301 ? Cancer Care: Provides financial assistance, online support groups, medication/co-pay assistance.  1-800-813-HOPE (250) 279-7718) ? [place] Assists [place] cancer patients and their families through emotional , educational and financial support.  5390175541 ? [place] DSS Where to apply for food stamps, Medicaid and utility assistance. (919)512-3510 ? RCATS: Transportation to medical appointments. (440)532-2880 ? Social Security Administration: May apply for disability if have a Stage IV cancer. [phone] [phone] ? [employer], Disability and Transit Services: Assists with nutrition, care and transit needs. [place] Support Programs: '@10RELATIVEDAYS'$ @ > Cancer Support Group  2nd Tuesday of the month 1pm-2pm, Journey Room  > Creative Journey  3rd Tuesday of the month 1130am-1pm, Journey Room  > Look Good Feel Better  1st Wednesday of the month 10am-12 noon, Journey Room (Call [place] to register [phone])

## 2016-09-28 NOTE — Progress Notes (Signed)
[facility]  Progress Note  Patient Care Team: [facility] [facility] as PCP - General Lydia Binder, MD (Gastroenterology)  CHIEF COMPLAINTS/PURPOSE OF CONSULTATION:  Small cell lung cancer. Limited stage   Small cell carcinoma (HCC)   07/12/2016 Imaging    CTA chest 3.9 x 3.5 cm right lower lobe mass with imaging features most compatible with a primary lung carcinoma.Marked metastatic right hilar and mediastinal adenopathy including a confluent mass of adenopathy in the right hilum, encasing and causing marked narrowing of the central pulmonary arteries on the right with a short segment of occlusion of the right lower lobe bronchus.Mild postobstructive changes in the inferior, medial right lower lobe.Mild changes of COPD       07/16/2016 Procedure    Endoscopic bronchoscopy with transbronchial biopsy of #7 nodes and biopsy of RLL lung mass      07/16/2016 Pathology Results    Lung, biopsy, Right Lower Lobe - SMALL CELL CARCINOMA.       07/21/2016 Imaging    MRI brain Negative MRI of the brain. No evidence for metastatic disease to the brain or meninges.      07/26/2016 PET scan    Markedly hypermetabolic right lower lobe lesion with hypermetabolic metastatic disease in the right hilum and mediastinum. 2. Several scattered hypermetabolic ground-glass opacities in the right upper lobe. FDG accumulation within these nodules is higher than typically seen for infectious/inflammatory etiology although this remains within the differential. Atypical appearance of metastatic spread would also be a consideration. 3. Coronary artery atherosclerosis. 4. **An incidental finding of potential clinical significance has been found. 3.8 cm abdominal aortic aneurysm       07/27/2016 -  Chemotherapy    The patient had palonosetron (ALOXI) injection 0.25 mg, 0.25 mg, Intravenous,  Once, 1 of 4 cycles  pegfilgrastim (NEULASTA ONPRO KIT) injection 6 mg, 6 mg, Subcutaneous,   Once, 1 of 1 cycle  pegfilgrastim (NEULASTA ONPRO KIT) injection 6 mg, 6 mg, Subcutaneous, Once, 0 of 3 cycles  CISplatin (PLATINOL) 138 mg in sodium chloride 0.9 % 500 mL chemo infusion, 80 mg/m2 = 138 mg, Intravenous,  Once, 1 of 4 cycles  etoposide (VEPESID) 170 mg in sodium chloride 0.9 % 500 mL chemo infusion, 100 mg/m2 = 170 mg, Intravenous,  Once, 1 of 4 cycles  fosaprepitant (EMEND) 150 mg, dexamethasone (DECADRON) 12 mg in sodium chloride 0.9 % 145 mL IVPB, , Intravenous,  Once, 1 of 4 cycles  for chemotherapy treatment.  Cisplatin/Etoposide      HISTORY OF PRESENTING ILLNESS:  Lydia Moore 55 y.o. female is here for additional follow-up of limited stage SCLC.   Patient is unaccompanied. She is here for cycle 4 of Cisplatin/Etoposide.  She denies headaches, blurry vision. Appetite is good. Energy is baseline. She notes that she has several days after chemotherapy where fatigue can be severe.  CT imaging was reviewed with the patient. Results are noted below.   Caroljean reports normal bowels. She denies neuropathy or edema in her legs.  Patient has stopped Chantix because she says it made her nauseas. She has not experienced any nausea since stopping the Chantix.  Patient needs refill for Ambien. She notes that this has helped significantly with her sleep.   She has not had the flu shot and prefers not to have one.   MEDICAL HISTORY:  Past Medical History:  Diagnosis Date  . Elevated cholesterol   . GERD (gastroesophageal reflux disease)   . GI bleed   .  Hypertension   . Small cell carcinoma ([place]) 07/21/2016    SURGICAL HISTORY: Past Surgical History:  Procedure Laterality Date  . ABDOMINAL HYSTERECTOMY     partial  . CESAREAN SECTION    . COLONOSCOPY    . PORTACATH PLACEMENT Left 08/04/2016   Procedure: INSERTION PORT-A-CATH LEFT SUBCLAVIAN;  Surgeon: Aviva Signs, MD;  Location: [facility];  Service: General;  Laterality: Left;  . TONSILLECTOMY    . VIDEO  BRONCHOSCOPY WITH ENDOBRONCHIAL ULTRASOUND N/A 07/16/2016   Procedure: VIDEO BRONCHOSCOPY WITH ENDOBRONCHIAL ULTRASOUND with ultrasound transbronchial biopsy of node #7 and right lower lobe lesion;  Surgeon: Grace Isaac, MD;  Location: [facility] OR;  Service: Thoracic;  Laterality: N/A;    SOCIAL HISTORY: Social History   Social History  . Marital status: Married    Spouse name: N/A  . Number of children: 4  . Years of education: N/A   Occupational History  . [employer]   Social History Main Topics  . Smoking status: Current Every Day Smoker    Packs/day: 0.50    Years: 35.00    Types: Cigarettes  . Smokeless tobacco: Never Used  . Alcohol use Yes     Comment: occasionally, Couple drinks per mo  . Drug use:     Frequency: 1.0 time per week    Types: Marijuana     Comment: weekly  . Sexual activity: Yes    Birth control/ protection: Surgical     Comment: married   Other Topics Concern  . Not on file   Social History Narrative   Lives w/ husband   Married 36 years 4 children 10 grandchildren Works as a Metallurgist, 1 ppd. She wants to quit but now is not the time. ETOH, none.  FAMILY HISTORY: Family History  Problem Relation Age of Onset  . Heart disease Mother   . Heart disease Father   . COPD Sister   . HIV Brother    Mother deceased at 30 yo of heart disease Father still living at 31 yo 2 sisters living. 1 brother deceased of HIV at 62 yo  ALLERGIES:  has No Known Allergies.  MEDICATIONS:  Current Outpatient Prescriptions  Medication Sig Dispense Refill  . acetaminophen (TYLENOL) 500 MG tablet Take 1,000 mg by mouth every 6 (six) hours as needed for mild pain, fever or headache.    Marland Kitchen amLODipine (NORVASC) 5 MG tablet Take 1 tablet (5 mg total) by mouth daily. 30 tablet 0  . CISPLATIN IV Inject into the vein. Every 21 days    . ETOPOSIDE IV Inject into the vein. Every 21 days    . HYDROcodone-acetaminophen (NORCO) 5-325 MG tablet Take 1-2  tablets by mouth every 4 (four) hours as needed for moderate pain. 30 tablet 0  . lidocaine-prilocaine (EMLA) cream Apply to affected area once 30 g 3  . loratadine (CLARITIN) 10 MG tablet Take 1 tablet (10 mg total) by mouth daily. 30 tablet 3  . magnesium oxide (MAG-OX) 400 (241.3 Mg) MG tablet Take 1 tablet (400 mg total) by mouth 2 (two) times daily. 60 tablet 2  . ondansetron (ZOFRAN) 8 MG tablet Take 1 tablet (8 mg total) by mouth 2 (two) times daily as needed. Start on the third day after cisplatin chemotherapy. 30 tablet 1  . Pegfilgrastim (NEULASTA ONPRO [place]) Inject into the skin. Every 21 days    . potassium chloride SA (K-DUR,KLOR-CON) 20 MEQ tablet Take 0.5 tablets (10 mEq total) by mouth 2 (  two) times daily. 60 tablet 1  . prochlorperazine (COMPAZINE) 10 MG tablet Take 1 tablet (10 mg total) by mouth every 6 (six) hours as needed (Nausea or vomiting). 30 tablet 1  . Pseudoeph-Doxylamine-DM-APAP (NYQUIL PO) Take 30 mLs by mouth at bedtime as needed (sleep).    . varenicline (CHANTIX STARTING MONTH PAK) 0.5 MG X 11 & 1 MG X 42 tablet Take one 0.5 mg tablet by mouth once daily for 3 days, then increase to one 0.5 mg tablet twice daily for 4 days, then increase to one 1 mg tablet twice daily. 53 tablet 0  . zolpidem (AMBIEN) 10 MG tablet Take 1 tablet (10 mg total) by mouth at bedtime as needed for sleep. 30 tablet 2   No current facility-administered medications for this visit.    Facility-Administered Medications Ordered in Other Visits  Medication Dose Route Frequency Provider Last Rate Last Dose  . sodium chloride flush (NS) 0.9 % injection 10 mL  10 mL Intracatheter PRN Patrici Ranks, MD   10 mL at 09/28/16 0830    Review of Systems  Eyes: Negative.   Cardiovascular: Negative.  Negative for leg swelling.  Gastrointestinal: Negative for nausea and vomiting.  Genitourinary: Negative.   Musculoskeletal: Negative.   Skin: Negative.   Neurological: Negative for headaches.    Endo/Heme/Allergies: Negative.   Psychiatric/Behavioral: Negative.   All other systems reviewed and are negative. 14 point ROS was done and is otherwise as detailed above or in HPI   PHYSICAL EXAMINATION: ECOG PERFORMANCE STATUS: 1 - Symptomatic but completely ambulatory  Vitals with BMI 09/28/2016  Height   Weight 152 lbs 10 oz  BMI   Systolic 381  Diastolic 96  Pulse 72  Respirations 16    Physical Exam  Constitutional: She is oriented to person, place, and time and well-developed, well-nourished, and in no distress.  HENT:  Head: Normocephalic and atraumatic.  Nose: Nose normal.  Mouth/Throat: Oropharynx is clear and moist. No oropharyngeal exudate.  Eyes: Conjunctivae and EOM are normal. Pupils are equal, round, and reactive to light. Right eye exhibits no discharge. Left eye exhibits no discharge. No scleral icterus.  Neck: Normal range of motion. Neck supple. No tracheal deviation present. No thyromegaly present.  Cardiovascular: Normal rate, regular rhythm and normal heart sounds.  Exam reveals no gallop and no friction rub.   No murmur heard. Pulmonary/Chest: Effort normal. She has no wheezes. She has no rales.  Decreased BS R  Abdominal: Soft. Bowel sounds are normal. She exhibits no distension and no mass. There is no tenderness. There is no rebound and no guarding.  Musculoskeletal: Normal range of motion. She exhibits no edema.  Lymphadenopathy:    She has no cervical adenopathy.  Neurological: She is alert and oriented to person, place, and time. She has normal reflexes. No cranial nerve deficit. Gait normal. Coordination normal.  Skin: Skin is warm and dry. No rash noted.  Psychiatric: Mood, memory, affect and judgment normal.  Nursing note and vitals reviewed.   LABORATORY DATA:  I have reviewed the data as listed Lab Results  Component Value Date   WBC 7.8 09/28/2016   HGB 12.4 09/28/2016   HCT 36.0 09/28/2016   MCV 93.3 09/28/2016   PLT 157  09/28/2016   CMP     Component Value Date/Time   NA 127 (L) 09/28/2016 0828   K 3.5 09/28/2016 0828   CL 93 (L) 09/28/2016 0828   CO2 26 09/28/2016 0828   GLUCOSE 124 (  H) 09/28/2016 0828   BUN 11 09/28/2016 0828   CREATININE 0.54 09/28/2016 0828   CALCIUM 8.9 09/28/2016 0828   PROT 6.7 09/28/2016 0828   ALBUMIN 4.2 09/28/2016 0828   AST 23 09/28/2016 0828   ALT 23 09/28/2016 0828   ALKPHOS 108 09/28/2016 0828   BILITOT 0.7 09/28/2016 0828   GFRNONAA >60 09/28/2016 0828   GFRAA >60 09/28/2016 0630     RADIOGRAPHIC STUDIES: I have personally reviewed the radiological images as listed and agreed with the findings in the report. No results found.  Study Result   CLINICAL DATA:  55 year old female with history of small cell carcinoma of the lung status post 3 rounds of aggressive chemotherapy. Followup study.  EXAM: CT CHEST WITH CONTRAST  TECHNIQUE: Multidetector CT imaging of the chest was performed during intravenous contrast administration.  CONTRAST:  2m ISOVUE-300 IOPAMIDOL (ISOVUE-300) INJECTION 61%  COMPARISON:  Head CT 07/26/2016.  FINDINGS: Cardiovascular: Heart size is normal. There is no significant pericardial fluid, thickening or pericardial calcification. There is aortic atherosclerosis, as well as atherosclerosis of the great vessels of the mediastinum and the coronary arteries, including calcified atherosclerotic plaque in the left anterior descending, left circumflex and right coronary arteries. Left subclavian single-lumen porta cath with tip terminating in the distal superior vena cava.  Mediastinum/Nodes: Previously noted mediastinal and right hilar lymphadenopathy has decreased compared to prior examinations, with the largest residual lesions as follows. 1.6 cm low right paratracheal lymph node. 1.6 cm subcarinal lymph node. 1.1 cm right hilar lymph node. Esophagus is unremarkable in appearance. No axillary  lymphadenopathy.  Lungs/Pleura: Previously noted right lower lobe mass is no longer clearly identified. Postobstructive changes in the posterior aspect of the right lower lobe noted on the prior examination have also resolved, with a small amount of residual scarring in its wake. Previously noted ill-defined ground-glass attenuation nodules in the right lung have resolved. No new pulmonary nodules or masses are noted. No acute consolidative airspace disease. No pleural effusions.  Upper Abdomen: Spleen is incompletely visualized, but appears enlarged, particularly when viewed from the coronal reformats.  Musculoskeletal: There are no aggressive appearing lytic or blastic lesions noted in the visualized portions of the skeleton.  IMPRESSION: 1. Today's study demonstrates a positive response to therapy with complete resolution of the previously noted right lower lobe mass (and the associated postobstructive changes in the right lower lobe), resolution of the numerous ill-defined ground-glass attenuation nodules in other portions of the right lung, and regression of right hilar and mediastinal lymphadenopathy, as detailed above. 2. Splenomegaly. 3. Aortic atherosclerosis, in addition to three-vessel coronary artery disease. Please note that although the presence of coronary artery calcium documents the presence of coronary artery disease, the severity of this disease and any potential stenosis cannot be assessed on this non-gated CT examination. Assessment for potential risk factor modification, dietary therapy or pharmacologic therapy may be warranted, if clinically indicated. 4. Additional incidental findings, as above.   Electronically Signed   By: DVinnie LangtonM.D.   On: 09/23/2016 15:36     PATHOLOGY     ASSESSMENT & PLAN:  Small cell lung cancer, limited stage Cough Hyponatremia Tobacco Abuse SIADH Headaches Hypomagnesemia   CT imaging was  reviewed with the patient. Results are noted above. I discussed her results with Dr. KSondra Come She has been referred back to radiation oncology.   Labs were reviewed with the patient. She remains hyponatremic. She is asymptomatic. Based upon her imaging I don't feel at this point  it is related to her SCLC, we can work this up further at follow-up.  Magnesium is at the low end of normal. She was encouraged to restart her slo mag once daily.   I recommended she try to stay active. I also recommended she get the flu shot because she is on chemotherapy.  I have refilled Ambien. Smoking cessation was addressed again. She has cut back significantly but understands clearly that the goal is cessation.   She will return in 3 weeks for ongoing therapy and follow-up.   All questions were answered. The patient knows to call the clinic with any problems, questions or concerns.  This document serves as a record of services personally performed by Ancil Linsey, MD. It was created on her behalf by Elmyra Ricks, a trained medical scribe. The creation of this record is based on the scribe's personal observations and the provider's statements to them. This document has been checked and approved by the attending provider.  I have reviewed the above documentation for accuracy and completeness and I agree with the above.  This note was electronically signed.    Molli Hazard, MD  09/28/2016 5:36 PM

## 2016-09-28 NOTE — Patient Instructions (Signed)
[facility] Discharge Instructions for Patients Receiving Chemotherapy   Beginning January 23rd 2017 lab work for the [facility] will be done in the  Main lab at [facility] on 1st floor. If you have a lab appointment with the [place] please come in thru the  Main Entrance and check in at the main information desk   Today you received the following chemotherapy agents cisplatin and etoposide  To help prevent nausea and vomiting after your treatment, we encourage you to take your nausea medication     If you develop nausea and vomiting, or diarrhea that is not controlled by your medication, call the clinic.  The clinic phone number is [phone]. Office hours are Monday-Friday 8:30am-5:00pm.  BELOW ARE SYMPTOMS THAT SHOULD BE REPORTED IMMEDIATELY:  *FEVER GREATER THAN 101.0 F  *CHILLS WITH OR WITHOUT FEVER  NAUSEA AND VOMITING THAT IS NOT CONTROLLED WITH YOUR NAUSEA MEDICATION  *UNUSUAL SHORTNESS OF BREATH  *UNUSUAL BRUISING OR BLEEDING  TENDERNESS IN MOUTH AND THROAT WITH OR WITHOUT PRESENCE OF ULCERS  *URINARY PROBLEMS  *BOWEL PROBLEMS  UNUSUAL RASH Items with * indicate a potential emergency and should be followed up as soon as possible. If you have an emergency after office hours please contact your primary care physician or go to the nearest emergency department.  Please call the clinic during office hours if you have any questions or concerns.   You may also contact the Patient Navigator at [phone] should you have any questions or need assistance in obtaining follow up care.      Resources For Cancer Patients and their Caregivers ? American Cancer Society: Can assist with transportation, wigs, general needs, runs Look Good Feel Better.        2488852539 ? Cancer Care: Provides financial assistance, online support groups, medication/co-pay assistance.  1-800-813-HOPE 279-247-0176) ? [place] Assists  [place] cancer patients and their families through emotional , educational and financial support.  321-842-0277 ? [place] DSS Where to apply for food stamps, Medicaid and utility assistance. 256-689-9395 ? [employer]ATS: Transportation to medical appointments. ([phone] ? Social Security Administration: May apply for disability if have a Stage IV cancer. 858-228-4635 [phone] ? [employer], Disability and Transit Services: Assists with nutrition, care and transit needs. [phone]

## 2016-09-29 ENCOUNTER — Encounter (HOSPITAL_BASED_OUTPATIENT_CLINIC_OR_DEPARTMENT_OTHER): Payer: Managed Care, Other (non HMO)

## 2016-09-29 ENCOUNTER — Encounter (HOSPITAL_COMMUNITY): Payer: Self-pay

## 2016-09-29 VITALS — BP 155/85 | HR 82 | Temp 98.7°F | Resp 16 | Wt 151.8 lb

## 2016-09-29 DIAGNOSIS — C3431 Malignant neoplasm of lower lobe, right bronchus or lung: Secondary | ICD-10-CM | POA: Diagnosis not present

## 2016-09-29 DIAGNOSIS — Z5111 Encounter for antineoplastic chemotherapy: Secondary | ICD-10-CM

## 2016-09-29 DIAGNOSIS — C801 Malignant (primary) neoplasm, unspecified: Secondary | ICD-10-CM

## 2016-09-29 MED ORDER — DEXAMETHASONE SODIUM PHOSPHATE 10 MG/ML IJ SOLN
INTRAMUSCULAR | Status: AC
  Filled 2016-09-29: qty 1

## 2016-09-29 MED ORDER — ETOPOSIDE CHEMO INJECTION 1 GM/50ML
100.0000 mg/m2 | Freq: Once | INTRAVENOUS | Status: AC
  Administered 2016-09-29: 170 mg via INTRAVENOUS
  Filled 2016-09-29: qty 8.5

## 2016-09-29 MED ORDER — HEPARIN SOD (PORK) LOCK FLUSH 100 UNIT/ML IV SOLN
500.0000 [IU] | Freq: Once | INTRAVENOUS | Status: AC | PRN
  Administered 2016-09-29: 500 [IU]

## 2016-09-29 MED ORDER — DEXAMETHASONE SODIUM PHOSPHATE 10 MG/ML IJ SOLN
10.0000 mg | Freq: Once | INTRAMUSCULAR | Status: AC
  Administered 2016-09-29: 10 mg via INTRAVENOUS

## 2016-09-29 MED ORDER — SODIUM CHLORIDE 0.9 % IV SOLN: 10.0000 mg | Freq: Once | INTRAVENOUS | Status: DC

## 2016-09-29 MED ORDER — SODIUM CHLORIDE 0.9 % IV SOLN
Freq: Once | INTRAVENOUS | Status: AC
  Administered 2016-09-29: 12:00:00 via INTRAVENOUS

## 2016-09-29 MED ORDER — SODIUM CHLORIDE 0.9% FLUSH
10.0000 mL | INTRAVENOUS | Status: DC | PRN
  Administered 2016-09-29: 10 mL
  Filled 2016-09-29: qty 10

## 2016-09-29 MED ORDER — HEPARIN SOD (PORK) LOCK FLUSH 100 UNIT/ML IV SOLN
INTRAVENOUS | Status: AC
  Filled 2016-09-29: qty 5

## 2016-09-29 NOTE — Progress Notes (Signed)
Chemotherapy given today per orders. Patient tolerated it well. No problems. Vitals stable, discharged from clinic ambulatory.follow up as scheduled

## 2016-09-29 NOTE — Patient Instructions (Signed)
[facility] Discharge Instructions for Patients Receiving Chemotherapy   Beginning January 23rd 2017 lab work for the Cancer Center will be done in the  Main lab at [place] [facility] on 1st floor. If you have a lab appointment with the Cancer Center please come in thru the  Main Entrance and check in at the main information desk   Today you received the following chemotherapy agents Etoposide  To help prevent nausea and vomiting after your treatment, we encourage you to take your nausea medication   If you develop nausea and vomiting, or diarrhea that is not controlled by your medication, call the clinic.  The clinic phone number is [phone]. Office hours are Monday-Friday 8:30am-5:00pm.  BELOW ARE SYMPTOMS THAT SHOULD BE REPORTED IMMEDIATELY:  *FEVER GREATER THAN 101.0 F  *CHILLS WITH OR WITHOUT FEVER  NAUSEA AND VOMITING THAT IS NOT CONTROLLED WITH YOUR NAUSEA MEDICATION  *UNUSUAL SHORTNESS OF BREATH  *UNUSUAL BRUISING OR BLEEDING  TENDERNESS IN MOUTH AND THROAT WITH OR WITHOUT PRESENCE OF ULCERS  *URINARY PROBLEMS  *BOWEL PROBLEMS  UNUSUAL RASH Items with * indicate a potential emergency and should be followed up as soon as possible. If you have an emergency after office hours please contact your primary care physician or go to the nearest emergency department.  Please call the clinic during office hours if you have any questions or concerns.   You may also contact the Patient Navigator at [phone] should you have any questions or need assistance in obtaining follow up care.      Resources For Cancer Patients and their Caregivers ? American Cancer Society: Can assist with transportation, wigs, general needs, runs Look Good Feel Better.        1-888-227-6333 ? Cancer Care: Provides financial assistance, online support groups, medication/co-pay assistance.  1-800-813-HOPE (4673) ? [facility] Assists [place]  cancer patients and their families through emotional , educational and financial support.  336-427-4357 ? [place] DSS Where to apply for food stamps, Medicaid and utility assistance. 336-342-1394 ? RCATS: Transportation to medical appointments. 336-347-2287 ? Social Security Administration: May apply for disability if have a Stage IV cancer. 336-342-7796 1-800-772-1213 ? [place] Aging, Disability and Transit Services: Assists with nutrition, care and transit needs. [phone]          

## 2016-09-30 ENCOUNTER — Ambulatory Visit (HOSPITAL_COMMUNITY): Payer: Managed Care, Other (non HMO)

## 2016-09-30 ENCOUNTER — Encounter (HOSPITAL_BASED_OUTPATIENT_CLINIC_OR_DEPARTMENT_OTHER): Payer: Managed Care, Other (non HMO)

## 2016-09-30 VITALS — BP 157/90 | HR 70 | Temp 97.8°F | Resp 16 | Wt 152.2 lb

## 2016-09-30 DIAGNOSIS — C801 Malignant (primary) neoplasm, unspecified: Secondary | ICD-10-CM

## 2016-09-30 DIAGNOSIS — C3431 Malignant neoplasm of lower lobe, right bronchus or lung: Secondary | ICD-10-CM

## 2016-09-30 DIAGNOSIS — Z5111 Encounter for antineoplastic chemotherapy: Secondary | ICD-10-CM | POA: Diagnosis not present

## 2016-09-30 MED ORDER — DEXAMETHASONE SODIUM PHOSPHATE 10 MG/ML IJ SOLN
10.0000 mg | Freq: Once | INTRAMUSCULAR | Status: AC
  Administered 2016-09-30: 10 mg via INTRAVENOUS

## 2016-09-30 MED ORDER — SODIUM CHLORIDE 0.9 % IV SOLN
100.0000 mg/m2 | Freq: Once | INTRAVENOUS | Status: AC
  Administered 2016-09-30: 170 mg via INTRAVENOUS
  Filled 2016-09-30: qty 8.5

## 2016-09-30 MED ORDER — PEGFILGRASTIM 6 MG/0.6ML ~~LOC~~ PSKT
6.0000 mg | PREFILLED_SYRINGE | Freq: Once | SUBCUTANEOUS | Status: AC
  Administered 2016-09-30: 6 mg via SUBCUTANEOUS

## 2016-09-30 MED ORDER — SODIUM CHLORIDE 0.9 % IV SOLN
Freq: Once | INTRAVENOUS | Status: AC
  Administered 2016-09-30: 12:00:00 via INTRAVENOUS

## 2016-09-30 MED ORDER — DEXAMETHASONE SODIUM PHOSPHATE 10 MG/ML IJ SOLN
INTRAMUSCULAR | Status: AC
  Filled 2016-09-30: qty 1

## 2016-09-30 MED ORDER — LORAZEPAM 2 MG/ML IJ SOLN
INTRAMUSCULAR | Status: AC
  Filled 2016-09-30: qty 1

## 2016-09-30 MED ORDER — LORAZEPAM 2 MG/ML IJ SOLN
0.5000 mg | Freq: Once | INTRAMUSCULAR | Status: AC
  Administered 2016-09-30: 0.5 mg via INTRAVENOUS

## 2016-09-30 MED ORDER — SODIUM CHLORIDE 0.9 % IV SOLN: 10.0000 mg | Freq: Once | INTRAVENOUS | Status: DC

## 2016-09-30 MED ORDER — HEPARIN SOD (PORK) LOCK FLUSH 100 UNIT/ML IV SOLN
500.0000 [IU] | Freq: Once | INTRAVENOUS | Status: AC | PRN
  Administered 2016-09-30: 500 [IU]

## 2016-09-30 MED ORDER — SODIUM CHLORIDE 0.9% FLUSH
10.0000 mL | INTRAVENOUS | Status: DC | PRN
  Administered 2016-09-30: 10 mL
  Filled 2016-09-30: qty 10

## 2016-09-30 MED ORDER — HEPARIN SOD (PORK) LOCK FLUSH 100 UNIT/ML IV SOLN
INTRAVENOUS | Status: AC
  Filled 2016-09-30: qty 5

## 2016-09-30 NOTE — Progress Notes (Signed)
Patient complained of nausea, Ativan 0.'5mg'$  given per orders.   Nausea resolved after ativan given.   Chemotherapy given today per orders.Neulasta on pro device applied to patient and instructions given to patient medicine will be given between 5pm and 6 pm. Patient tolerated it well, no problems. Vitals stable and discharged ambulatory from clinic. Follow up as scheduled.

## 2016-09-30 NOTE — Patient Instructions (Signed)
[facility] Discharge Instructions for Patients Receiving Chemotherapy   Beginning January 23rd 2017 lab work for the Cancer Center will be done in the  Main lab at [place] on 1st floor. If you have a lab appointment with the Cancer Center please come in thru the  Main Entrance and check in at the main information desk   Today you received the following chemotherapy agents Etoposide  To help prevent nausea and vomiting after your treatment, we encourage you to take your nausea medication   If you develop nausea and vomiting, or diarrhea that is not controlled by your medication, call the clinic.  The clinic phone number is [phone]. Office hours are Monday-Friday 8:30am-5:00pm.  BELOW ARE SYMPTOMS THAT SHOULD BE REPORTED IMMEDIATELY:  *FEVER GREATER THAN 101.0 F  *CHILLS WITH OR WITHOUT FEVER  NAUSEA AND VOMITING THAT IS NOT CONTROLLED WITH YOUR NAUSEA MEDICATION  *UNUSUAL SHORTNESS OF BREATH  *UNUSUAL BRUISING OR BLEEDING  TENDERNESS IN MOUTH AND THROAT WITH OR WITHOUT PRESENCE OF ULCERS  *URINARY PROBLEMS  *BOWEL PROBLEMS  UNUSUAL RASH Items with * indicate a potential emergency and should be followed up as soon as possible. If you have an emergency after office hours please contact your primary care physician or go to the nearest emergency department.  Please call the clinic during office hours if you have any questions or concerns.   You may also contact the Patient Navigator at [phone] should you have any questions or need assistance in obtaining follow up care.      Resources For Cancer Patients and their Caregivers ? American Cancer Society: Can assist with transportation, wigs, general needs, runs Look Good Feel Better.        1-888-227-6333 ? Cancer Care: Provides financial assistance, online support groups, medication/co-pay assistance.  1-800-813-HOPE (4673) ? [facility] Assists [place]  cancer patients and their families through emotional , educational and financial support.  336-427-4357 ? [place] DSS Where to apply for food stamps, Medicaid and utility assistance. 336-342-1394 ? RCATS: Transportation to medical appointments. 336-347-2287 ? Social Security Administration: May apply for disability if have a Stage IV cancer. 336-342-7796 1-800-772-1213 ? [place] Aging, Disability and Transit Services: Assists with nutrition, care and transit needs. 336-349-2343          

## 2016-10-05 ENCOUNTER — Other Ambulatory Visit (HOSPITAL_COMMUNITY): Payer: Self-pay | Admitting: Emergency Medicine

## 2016-10-05 MED ORDER — RANITIDINE HCL 150 MG PO TABS: 150.0000 mg | ORAL_TABLET | Freq: Every day | ORAL | 3 refills | Status: DC

## 2016-10-05 MED ORDER — OMEPRAZOLE 40 MG PO CPDR: 40.0000 mg | DELAYED_RELEASE_CAPSULE | Freq: Every day | ORAL | 3 refills | Status: DC

## 2016-10-05 NOTE — Progress Notes (Signed)
Pt called and stated that she is having bad heartburn.  She states that she takes prilosec 20 mg OTC daily but it is not helping.  I spoke with Kirby Crigler PA and he said to take 40 mg prilosec daily and she could add Zantac in the evening to help.  I escribed both of those to her pharmacy.  Pt verbalized understanding.

## 2016-10-19 ENCOUNTER — Ambulatory Visit (HOSPITAL_COMMUNITY): Payer: Managed Care, Other (non HMO)

## 2016-10-19 ENCOUNTER — Encounter (HOSPITAL_COMMUNITY): Payer: Managed Care, Other (non HMO) | Attending: Hematology & Oncology

## 2016-10-19 ENCOUNTER — Encounter (HOSPITAL_COMMUNITY): Payer: Self-pay

## 2016-10-19 ENCOUNTER — Encounter (HOSPITAL_BASED_OUTPATIENT_CLINIC_OR_DEPARTMENT_OTHER): Payer: Managed Care, Other (non HMO) | Admitting: Hematology & Oncology

## 2016-10-19 ENCOUNTER — Ambulatory Visit (HOSPITAL_COMMUNITY): Payer: Managed Care, Other (non HMO) | Admitting: Hematology & Oncology

## 2016-10-19 VITALS — Ht 65.5 in | Wt 149.0 lb

## 2016-10-19 VITALS — BP 164/90 | HR 82 | Temp 98.1°F | Resp 18

## 2016-10-19 DIAGNOSIS — Z720 Tobacco use: Secondary | ICD-10-CM | POA: Diagnosis not present

## 2016-10-19 DIAGNOSIS — E871 Hypo-osmolality and hyponatremia: Secondary | ICD-10-CM

## 2016-10-19 DIAGNOSIS — C3431 Malignant neoplasm of lower lobe, right bronchus or lung: Secondary | ICD-10-CM

## 2016-10-19 DIAGNOSIS — C801 Malignant (primary) neoplasm, unspecified: Secondary | ICD-10-CM

## 2016-10-19 DIAGNOSIS — Z5111 Encounter for antineoplastic chemotherapy: Secondary | ICD-10-CM

## 2016-10-19 LAB — CBC WITH DIFFERENTIAL/PLATELET
Basophils Absolute: 0 10*3/uL (ref 0.0–0.1)
Basophils Relative: 1 %
EOS ABS: 0.1 10*3/uL (ref 0.0–0.7)
EOS PCT: 2 %
HCT: 36.4 % (ref 36.0–46.0)
HEMOGLOBIN: 12.4 g/dL (ref 12.0–15.0)
LYMPHS ABS: 1.1 10*3/uL (ref 0.7–4.0)
Lymphocytes Relative: 19 %
MCH: 33.6 pg (ref 26.0–34.0)
MCHC: 34.1 g/dL (ref 30.0–36.0)
MCV: 98.6 fL (ref 78.0–100.0)
MONOS PCT: 5 %
Monocytes Absolute: 0.3 10*3/uL (ref 0.1–1.0)
Neutro Abs: 4 10*3/uL (ref 1.7–7.7)
Neutrophils Relative %: 73 %
PLATELETS: 158 10*3/uL (ref 150–400)
RBC: 3.69 MIL/uL — ABNORMAL LOW (ref 3.87–5.11)
RDW: 17.5 % — ABNORMAL HIGH (ref 11.5–15.5)
WBC: 5.5 10*3/uL (ref 4.0–10.5)

## 2016-10-19 LAB — COMPREHENSIVE METABOLIC PANEL
ALK PHOS: 115 U/L (ref 38–126)
ALT: 19 U/L (ref 14–54)
ANION GAP: 8 (ref 5–15)
AST: 22 U/L (ref 15–41)
Albumin: 4.2 g/dL (ref 3.5–5.0)
BUN: 15 mg/dL (ref 6–20)
CALCIUM: 8.9 mg/dL (ref 8.9–10.3)
CO2: 27 mmol/L (ref 22–32)
Chloride: 99 mmol/L — ABNORMAL LOW (ref 101–111)
Creatinine, Ser: 0.54 mg/dL (ref 0.44–1.00)
Glucose, Bld: 139 mg/dL — ABNORMAL HIGH (ref 65–99)
Potassium: 3.6 mmol/L (ref 3.5–5.1)
SODIUM: 134 mmol/L — AB (ref 135–145)
Total Bilirubin: 0.3 mg/dL (ref 0.3–1.2)
Total Protein: 6.6 g/dL (ref 6.5–8.1)

## 2016-10-19 LAB — MAGNESIUM: MAGNESIUM: 1.9 mg/dL (ref 1.7–2.4)

## 2016-10-19 MED ORDER — HEPARIN SOD (PORK) LOCK FLUSH 100 UNIT/ML IV SOLN
INTRAVENOUS | Status: AC
  Filled 2016-10-19: qty 5

## 2016-10-19 MED ORDER — SODIUM CHLORIDE 0.9% FLUSH
10.0000 mL | INTRAVENOUS | Status: DC | PRN
  Administered 2016-10-19: 10 mL
  Filled 2016-10-19: qty 10

## 2016-10-19 MED ORDER — HEPARIN SOD (PORK) LOCK FLUSH 100 UNIT/ML IV SOLN
500.0000 [IU] | Freq: Once | INTRAVENOUS | Status: AC | PRN
  Administered 2016-10-19: 500 [IU]
  Filled 2016-10-19: qty 5

## 2016-10-19 MED ORDER — SODIUM CHLORIDE 0.9 % IV SOLN
Freq: Once | INTRAVENOUS | Status: AC
  Administered 2016-10-19: 11:00:00 via INTRAVENOUS

## 2016-10-19 MED ORDER — SODIUM CHLORIDE 0.9 % IV SOLN
Freq: Once | INTRAVENOUS | Status: AC
  Administered 2016-10-19: 11:00:00 via INTRAVENOUS
  Filled 2016-10-19: qty 5

## 2016-10-19 MED ORDER — PALONOSETRON HCL INJECTION 0.25 MG/5ML
0.2500 mg | Freq: Once | INTRAVENOUS | Status: AC
  Administered 2016-10-19: 0.25 mg via INTRAVENOUS
  Filled 2016-10-19: qty 5

## 2016-10-19 MED ORDER — POTASSIUM CHLORIDE 2 MEQ/ML IV SOLN
Freq: Once | INTRAVENOUS | Status: AC
  Administered 2016-10-19: 09:00:00 via INTRAVENOUS
  Filled 2016-10-19: qty 10

## 2016-10-19 MED ORDER — SODIUM CHLORIDE 0.9 % IV SOLN
80.0000 mg/m2 | Freq: Once | INTRAVENOUS | Status: AC
  Administered 2016-10-19: 138 mg via INTRAVENOUS
  Filled 2016-10-19: qty 138

## 2016-10-19 MED ORDER — SODIUM CHLORIDE 0.9 % IV SOLN
100.0000 mg/m2 | Freq: Once | INTRAVENOUS | Status: AC
  Administered 2016-10-19: 170 mg via INTRAVENOUS
  Filled 2016-10-19: qty 8.5

## 2016-10-19 NOTE — Patient Instructions (Addendum)
[place] at [facility] Discharge Instructions  RECOMMENDATIONS MADE BY THE CONSULTANT AND ANY TEST RESULTS WILL BE SENT TO YOUR REFERRING PHYSICIAN.  You saw Dr.Penland today. Scans on 1/9. Return to clinic in 3 weeks for labs, chemo and follow up. See Amy at checkout for appointments.  Thank you for choosing [place] at [facility] to provide your oncology and hematology care.  To afford each patient quality time with our provider, please arrive at least 15 minutes before your scheduled appointment time.   Beginning January 23rd 2017 lab work for the [facility] will be done in the  Main lab at [facility] on 1st floor. If you have a lab appointment with the [place] please come in thru the  Main Entrance and check in at the main information desk  You need to re-schedule your appointment should you arrive 10 or more minutes late.  We strive to give you quality time with our providers, and arriving late affects you and other patients whose appointments are after yours.  Also, if you no show three or more times for appointments you may be dismissed from the clinic at the providers discretion.     Again, thank you for choosing [facility].  Our hope is that these requests will decrease the amount of time that you wait before being seen by our physicians.       _____________________________________________________________  Should you have questions after your visit to [facility], please contact our office at [phone] between the hours of 8:30 a.m. and 4:30 p.m.  Voicemails left after 4:30 p.m. will not be returned until the following business day.  For prescription refill requests, have your pharmacy contact our office.         Resources For Cancer Patients and their Caregivers ? American Cancer Society: Can assist with transportation, wigs, general needs, runs Look Good Feel Better.         (785)546-3096 ? Cancer Care: Provides financial assistance, online support groups, medication/co-pay assistance.  1-800-813-HOPE 639-746-1576) ? [facility] Assists [place] cancer patients and their families through emotional , educational and financial support.  (973)759-2668 ? [place] DSS Where to apply for food stamps, Medicaid and utility assistance. (323) 208-9784 ? RCATS: Transportation to medical appointments. (516)605-3080 ? Social Security Administration: May apply for disability if have a Stage IV cancer. (513)014-5207 919-342-7675 ? [employer], Disability and Transit Services: Assists with nutrition, care and transit needs. [place] Support Programs: '@10RELATIVEDAYS'$ @ > Cancer Support Group  2nd Tuesday of the month 1pm-2pm, Journey Room  > Creative Journey  3rd Tuesday of the month 1130am-1pm, Journey Room  > Look Good Feel Better  1st Wednesday of the month 10am-12 noon, Journey Room (Call [place] to register [phone])

## 2016-10-19 NOTE — Patient Instructions (Signed)
[facility] Discharge Instructions for Patients Receiving Chemotherapy   Beginning January 23rd 2017 lab work for the [facility] will be done in the  Main lab at [facility] on 1st floor. If you have a lab appointment with the [place] please come in thru the  Main Entrance and check in at the main information desk   Today you received the following chemotherapy agents Cisplatin, etoposide  To help prevent nausea and vomiting after your treatment, we encourage you to take your nausea medication  I   If you develop nausea and vomiting, or diarrhea that is not controlled by your medication, call the clinic.  The clinic phone number is [phone]. Office hours are Monday-Friday 8:30am-5:00pm.  BELOW ARE SYMPTOMS THAT SHOULD BE REPORTED IMMEDIATELY:  *FEVER GREATER THAN 101.0 F  *CHILLS WITH OR WITHOUT FEVER  NAUSEA AND VOMITING THAT IS NOT CONTROLLED WITH YOUR NAUSEA MEDICATION  *UNUSUAL SHORTNESS OF BREATH  *UNUSUAL BRUISING OR BLEEDING  TENDERNESS IN MOUTH AND THROAT WITH OR WITHOUT PRESENCE OF ULCERS  *URINARY PROBLEMS  *BOWEL PROBLEMS  UNUSUAL RASH Items with * indicate a potential emergency and should be followed up as soon as possible. If you have an emergency after office hours please contact your primary care physician or go to the nearest emergency department.  Please call the clinic during office hours if you have any questions or concerns.   You may also contact the Patient Navigator at [phone] should you have any questions or need assistance in obtaining follow up care.      Resources For Cancer Patients and their Caregivers ? American Cancer Society: Can assist with transportation, wigs, general needs, runs Look Good Feel Better.        9346029432 ? Cancer Care: Provides financial assistance, online support groups, medication/co-pay assistance.  1-800-813-HOPE (272)736-5390) ? [facility] Assists  [place] cancer patients and their families through emotional , educational and financial support.  838-460-1360 ? [place] DSS Where to apply for food stamps, Medicaid and utility assistance. 734-632-0034 ? RCATS: Transportation to medical appointments. (706) 702-9048 ? Social Security Administration: May apply for disability if have a Stage IV cancer. (862)213-6233 959-007-6254 ? [employer], Disability and Transit Services: Assists with nutrition, care and transit needs. 6840093253

## 2016-10-19 NOTE — Progress Notes (Signed)
Lydia Moore  Progress Note  Patient Care Team: [place] as PCP - General Danie Binder, MD (Gastroenterology)  CHIEF COMPLAINTS/PURPOSE OF CONSULTATION:  Small cell lung cancer. Limited stage   Small cell carcinoma (HCC)   07/12/2016 Imaging    CTA chest 3.9 x 3.5 cm right lower lobe mass with imaging features most compatible with a primary lung carcinoma.Marked metastatic right hilar and mediastinal adenopathy including a confluent mass of adenopathy in the right hilum, encasing and causing marked narrowing of the central pulmonary arteries on the right with a short segment of occlusion of the right lower lobe bronchus.Mild postobstructive changes in the inferior, medial right lower lobe.Mild changes of COPD       07/16/2016 Procedure    Endoscopic bronchoscopy with transbronchial biopsy of #7 nodes and biopsy of RLL lung mass      07/16/2016 Pathology Results    Lung, biopsy, Right Lower Lobe - SMALL CELL CARCINOMA.       07/21/2016 Imaging    MRI brain Negative MRI of the brain. No evidence for metastatic disease to the brain or meninges.      07/26/2016 PET scan    Markedly hypermetabolic right lower lobe lesion with hypermetabolic metastatic disease in the right hilum and mediastinum. 2. Several scattered hypermetabolic ground-glass opacities in the right upper lobe. FDG accumulation within these nodules is higher than typically seen for infectious/inflammatory etiology although this remains within the differential. Atypical appearance of metastatic spread would also be a consideration. 3. Coronary artery atherosclerosis. 4. **An incidental finding of potential clinical significance has been found. 3.8 cm abdominal aortic aneurysm       07/27/2016 -  Chemotherapy    The patient had palonosetron (ALOXI) injection 0.25 mg, 0.25 mg, Intravenous,  Once, 1 of 4 cycles  pegfilgrastim (NEULASTA ONPRO KIT) injection 6 mg, 6 mg, Subcutaneous,   Once, 1 of 1 cycle  pegfilgrastim (NEULASTA ONPRO KIT) injection 6 mg, 6 mg, Subcutaneous, Once, 0 of 3 cycles  CISplatin (PLATINOL) 138 mg in sodium chloride 0.9 % 500 mL chemo infusion, 80 mg/m2 = 138 mg, Intravenous,  Once, 1 of 4 cycles  etoposide (VEPESID) 170 mg in sodium chloride 0.9 % 500 mL chemo infusion, 100 mg/m2 = 170 mg, Intravenous,  Once, 1 of 4 cycles  fosaprepitant (EMEND) 150 mg, dexamethasone (DECADRON) 12 mg in sodium chloride 0.9 % 145 mL IVPB, , Intravenous,  Once, 1 of 4 cycles  for chemotherapy treatment.  Cisplatin/Etoposide      HISTORY OF PRESENTING ILLNESS:  Lydia Moore 55 y.o. female is here for additional follow-up of limited stage SCLC.   Patient is unaccompanied. She is here for cycle 5 of Cisplatin/Etoposide. She is tolerating chemotherapy, aside from the fatigue she experiences a few days following treatment. She denies vomiting, severe nausea, or bowel changes.  Lydia Moore reports acid reflux, but she takes Copywriter, advertising and says that alleviates her symptoms. She notes that her reflux is intermittent.   She continues to smoke. However, she says she only smokes ten cigarettes a day and does not finish the entire cigarette when she smokes.   Radiation is still on hold until she receives additional chemotherapy and ongoing response.  NO other major complaints. No headaches. Appetite is fairly good.  MEDICAL HISTORY:  Past Medical History:  Diagnosis Date  . Elevated cholesterol   . GERD (gastroesophageal reflux disease)   . GI bleed   . Hypertension   . Small cell  carcinoma ([place]) 07/21/2016    SURGICAL HISTORY: Past Surgical History:  Procedure Laterality Date  . ABDOMINAL HYSTERECTOMY     partial  . CESAREAN SECTION    . COLONOSCOPY    . PORTACATH PLACEMENT Left 08/04/2016   Procedure: INSERTION PORT-A-CATH LEFT SUBCLAVIAN;  Surgeon: Aviva Signs, MD;  Location: [facility];  Service: General;  Laterality: Left;  . TONSILLECTOMY    . VIDEO  BRONCHOSCOPY WITH ENDOBRONCHIAL ULTRASOUND N/A 07/16/2016   Procedure: VIDEO BRONCHOSCOPY WITH ENDOBRONCHIAL ULTRASOUND with ultrasound transbronchial biopsy of node #7 and right lower lobe lesion;  Surgeon: Grace Isaac, MD;  [facility];  Service: Thoracic;  Laterality: N/A;    SOCIAL HISTORY: Social History   Social History  . Marital status: Married    Spouse name: N/A  . Number of children: 4  . Years of education: N/A   Occupational History  . [employer]ydia [employer]   Social History Main Topics  . Smoking status: Current Every Day Smoker    Packs/day: 0.50    Years: 35.00    Types: Cigarettes  . Smokeless tobacco: Never Used  . Alcohol use Yes     Comment: occasionally, Couple drinks per mo  . Drug use:     Frequency: 1.0 time per week    Types: Marijuana     Comment: weekly  . Sexual activity: Yes    Birth control/ protection: Surgical     Comment: married   Other Topics Concern  . Not on file   Social History Narrative   Lives w/ husband   Married 36 years 4 children 10 grandchildren Works as a Metallurgist, 1 ppd. She wants to quit but now is not the time. ETOH, none.  FAMILY HISTORY: Family History  Problem Relation Age of Onset  . Heart disease Mother   . Heart disease Father   . COPD Sister   . HIV Brother    Mother deceased at 32 yo of heart disease Father still living at 110 yo 2 sisters living. 1 brother deceased of HIV at 30 yo  ALLERGIES:  has No Known Allergies.  MEDICATIONS:  Current Outpatient Prescriptions  Medication Sig Dispense Refill  . acetaminophen (TYLENOL) 500 MG tablet Take 1,000 mg by mouth every 6 (six) hours as needed for mild pain, fever or headache.    Marland Kitchen amLODipine (NORVASC) 5 MG tablet Take 1 tablet (5 mg total) by mouth daily. 30 tablet 0  . CISPLATIN IV Inject into the vein. Every 21 days    . ETOPOSIDE IV Inject into the vein. Every 21 days    . HYDROcodone-acetaminophen (NORCO) 5-325 MG tablet Take 1-2  tablets by mouth every 4 (four) hours as needed for moderate pain. 30 tablet 0  . lidocaine-prilocaine (EMLA) cream Apply to affected area once 30 g 3  . loratadine (CLARITIN) 10 MG tablet Take 1 tablet (10 mg total) by mouth daily. 30 tablet 3  . magnesium oxide (MAG-OX) 400 (241.3 Mg) MG tablet Take 1 tablet (400 mg total) by mouth 2 (two) times daily. 60 tablet 2  . omeprazole (PRILOSEC) 40 MG capsule Take 1 capsule (40 mg total) by mouth daily. 30 capsule 3  . ondansetron (ZOFRAN) 8 MG tablet Take 1 tablet (8 mg total) by mouth 2 (two) times daily as needed. Start on the third day after cisplatin chemotherapy. 30 tablet 1  . Pegfilgrastim (NEULASTA ONPRO [place]) Inject into the skin. Every 21 days    . potassium chloride SA (  K-DUR,KLOR-CON) 20 MEQ tablet Take 0.5 tablets (10 mEq total) by mouth 2 (two) times daily. 60 tablet 1  . prochlorperazine (COMPAZINE) 10 MG tablet Take 1 tablet (10 mg total) by mouth every 6 (six) hours as needed (Nausea or vomiting). 30 tablet 1  . Pseudoeph-Doxylamine-DM-APAP (NYQUIL PO) Take 30 mLs by mouth at bedtime as needed (sleep).    . ranitidine (ZANTAC) 150 MG tablet Take 1 tablet (150 mg total) by mouth at bedtime. 30 tablet 3  . varenicline (CHANTIX STARTING MONTH PAK) 0.5 MG X 11 & 1 MG X 42 tablet Take one 0.5 mg tablet by mouth once daily for 3 days, then increase to one 0.5 mg tablet twice daily for 4 days, then increase to one 1 mg tablet twice daily. 53 tablet 0  . zolpidem (AMBIEN) 10 MG tablet Take 1 tablet (10 mg total) by mouth at bedtime as needed for sleep. 30 tablet 2   No current facility-administered medications for this visit.     Review of Systems  Constitutional: Positive for malaise/fatigue.       Fatigue for a few days following chemotherapy   Eyes: Negative.   Cardiovascular: Negative.  Negative for leg swelling.  Gastrointestinal: Positive for heartburn. Negative for nausea and vomiting.       Heartburn managed with alka seltzer    Genitourinary: Negative.   Musculoskeletal: Negative.   Skin: Negative.   Neurological: Negative for headaches.  Endo/Heme/Allergies: Negative.   Psychiatric/Behavioral: Negative.   All other systems reviewed and are negative. 14 point ROS was done and is otherwise as detailed above or in HPI   PHYSICAL EXAMINATION: ECOG PERFORMANCE STATUS: 1 - Symptomatic but completely ambulatory   Vitals with BMI 10/19/2016  Height 5' 5.5"  Weight 149 lbs  BMI 18.8  Systolic 416  Diastolic 87  Pulse 80  Respirations 18  Physical Exam  Constitutional: She is oriented to person, place, and time and well-developed, well-nourished, and in no distress.  HENT:  Head: Normocephalic and atraumatic.  Nose: Nose normal.  Mouth/Throat: Oropharynx is clear and moist. No oropharyngeal exudate.  Eyes: Conjunctivae and EOM are normal. Pupils are equal, round, and reactive to light. Right eye exhibits no discharge. Left eye exhibits no discharge. No scleral icterus.  Neck: Normal range of motion. Neck supple. No tracheal deviation present. No thyromegaly present.  Cardiovascular: Normal rate, regular rhythm and normal heart sounds.  Exam reveals no gallop and no friction rub.   No murmur heard. Pulmonary/Chest: Effort normal. She has no wheezes. She has no rales.  Decreased BS R  Abdominal: Soft. Bowel sounds are normal. She exhibits no distension and no mass. There is no tenderness. There is no rebound and no guarding.  Musculoskeletal: Normal range of motion. She exhibits no edema.  Lymphadenopathy:    She has no cervical adenopathy.  Neurological: She is alert and oriented to person, place, and time. She has normal reflexes. No cranial nerve deficit. Gait normal. Coordination normal.  Skin: Skin is warm and dry. No rash noted.  Psychiatric: Mood, memory, affect and judgment normal.  Nursing note and vitals reviewed.   LABORATORY DATA:  I have reviewed the data as listed Lab Results  Component  Value Date   WBC 5.5 10/19/2016   HGB 12.4 10/19/2016   HCT 36.4 10/19/2016   MCV 98.6 10/19/2016   PLT 158 10/19/2016   CMP     Component Value Date/Time   NA 134 (L) 10/19/2016 0823   K 3.6  10/19/2016 0823   CL 99 (L) 10/19/2016 0823   CO2 27 10/19/2016 0823   GLUCOSE 139 (H) 10/19/2016 0823   BUN 15 10/19/2016 0823   CREATININE 0.54 10/19/2016 0823   CALCIUM 8.9 10/19/2016 0823   PROT 6.6 10/19/2016 0823   ALBUMIN 4.2 10/19/2016 0823   AST 22 10/19/2016 0823   ALT 19 10/19/2016 0823   ALKPHOS 115 10/19/2016 0823   BILITOT 0.3 10/19/2016 0823   GFRNONAA >60 10/19/2016 0823   GFRAA >60 10/19/2016 6222     RADIOGRAPHIC STUDIES: I have personally reviewed the radiological images as listed and agreed with the findings in the report. No results found.  Study Result   CLINICAL DATA:  55 year old female with history of small cell carcinoma of the lung status post 3 rounds of aggressive chemotherapy. Followup study.  EXAM: CT CHEST WITH CONTRAST  TECHNIQUE: Multidetector CT imaging of the chest was performed during intravenous contrast administration.  CONTRAST:  63m ISOVUE-300 IOPAMIDOL (ISOVUE-300) INJECTION 61%  COMPARISON:  Head CT 07/26/2016.  FINDINGS: Cardiovascular: Heart size is normal. There is no significant pericardial fluid, thickening or pericardial calcification. There is aortic atherosclerosis, as well as atherosclerosis of the great vessels of the mediastinum and the coronary arteries, including calcified atherosclerotic plaque in the left anterior descending, left circumflex and right coronary arteries. Left subclavian single-lumen porta cath with tip terminating in the distal superior vena cava.  Mediastinum/Nodes: Previously noted mediastinal and right hilar lymphadenopathy has decreased compared to prior examinations, with the largest residual lesions as follows. 1.6 cm low right paratracheal lymph node. 1.6 cm subcarinal lymph node.  1.1 cm right hilar lymph node. Esophagus is unremarkable in appearance. No axillary lymphadenopathy.  Lungs/Pleura: Previously noted right lower lobe mass is no longer clearly identified. Postobstructive changes in the posterior aspect of the right lower lobe noted on the prior examination have also resolved, with a small amount of residual scarring in its wake. Previously noted ill-defined ground-glass attenuation nodules in the right lung have resolved. No new pulmonary nodules or masses are noted. No acute consolidative airspace disease. No pleural effusions.  Upper Abdomen: Spleen is incompletely visualized, but appears enlarged, particularly when viewed from the coronal reformats.  Musculoskeletal: There are no aggressive appearing lytic or blastic lesions noted in the visualized portions of the skeleton.  IMPRESSION: 1. Today's study demonstrates a positive response to therapy with complete resolution of the previously noted right lower lobe mass (and the associated postobstructive changes in the right lower lobe), resolution of the numerous ill-defined ground-glass attenuation nodules in other portions of the right lung, and regression of right hilar and mediastinal lymphadenopathy, as detailed above. 2. Splenomegaly. 3. Aortic atherosclerosis, in addition to three-vessel coronary artery disease. Please note that although the presence of coronary artery calcium documents the presence of coronary artery disease, the severity of this disease and any potential stenosis cannot be assessed on this non-gated CT examination. Assessment for potential risk factor modification, dietary therapy or pharmacologic therapy may be warranted, if clinically indicated. 4. Additional incidental findings, as above.   Electronically Signed   By: DVinnie LangtonM.D.   On: 09/23/2016 15:36     PATHOLOGY     ASSESSMENT & PLAN:  Small cell lung cancer, limited  stage Cough Hyponatremia Tobacco Abuse SIADH Headaches Hypomagnesemia   Labs were reviewed with the patient. Results are noted above.   She has had a great response to chemotherapy and is tolerating treatment well. JLainyis hesitant to undergo radiation  after she completes chemotherapy. I explained to her radiation will help to prevent reoccurrence and increase curative potential. Will plan on re-imaging after she completes cycle #6.   I recommended she take the medication I prescribed for acid reflux if her symptoms worsen and she feels alka seltzer no longer manages it.  We discussed smoking cessation again.Marland Kitchen   She will RTC in 3 weeks for ongoing therapy and follow-up.  Orders Placed This Encounter  Procedures  . CT Chest W Contrast    Standing Status:   Future    Standing Expiration Date:   10/19/2017    Order Specific Question:   If indicated for the ordered procedure, I authorize the administration of contrast media per Radiology protocol    Answer:   Yes    Order Specific Question:   Reason for Exam (SYMPTOM  OR DIAGNOSIS REQUIRED)    Answer:   restaging SCLC    Order Specific Question:   Is patient pregnant?    Answer:   No    Order Specific Question:   Preferred imaging location?    Answer:   [facility]    All questions were answered. The patient knows to call the clinic with any problems, questions or concerns.  This document serves as a record of services personally performed by Ancil Linsey, MD. It was created on her behalf by Elmyra Ricks, a trained medical scribe. The creation of this record is based on the scribe's personal observations and the provider's statements to them. This document has been checked and approved by the attending provider.  I have reviewed the above documentation for accuracy and completeness and I agree with the above.  This note was electronically signed.    Molli Hazard, MD  10/21/2016 5:47 PM

## 2016-10-19 NOTE — Progress Notes (Signed)
Chemotherapy given today per orders. Patient tolerated well, no problems. Vitals stable and discharged from clinic ambulatory. Follow up as scheduled.

## 2016-10-20 ENCOUNTER — Encounter (HOSPITAL_BASED_OUTPATIENT_CLINIC_OR_DEPARTMENT_OTHER): Payer: Managed Care, Other (non HMO)

## 2016-10-20 VITALS — BP 140/63 | HR 75 | Temp 98.2°F | Resp 20 | Wt 149.0 lb

## 2016-10-20 DIAGNOSIS — C3431 Malignant neoplasm of lower lobe, right bronchus or lung: Secondary | ICD-10-CM

## 2016-10-20 DIAGNOSIS — Z5111 Encounter for antineoplastic chemotherapy: Secondary | ICD-10-CM

## 2016-10-20 DIAGNOSIS — C801 Malignant (primary) neoplasm, unspecified: Secondary | ICD-10-CM

## 2016-10-20 MED ORDER — HEPARIN SOD (PORK) LOCK FLUSH 100 UNIT/ML IV SOLN
INTRAVENOUS | Status: AC
  Filled 2016-10-20: qty 5

## 2016-10-20 MED ORDER — ETOPOSIDE CHEMO INJECTION 1 GM/50ML
100.0000 mg/m2 | Freq: Once | INTRAVENOUS | Status: AC
  Administered 2016-10-20: 170 mg via INTRAVENOUS
  Filled 2016-10-20: qty 8.5

## 2016-10-20 MED ORDER — HEPARIN SOD (PORK) LOCK FLUSH 100 UNIT/ML IV SOLN
500.0000 [IU] | Freq: Once | INTRAVENOUS | Status: AC | PRN
  Administered 2016-10-20: 500 [IU]

## 2016-10-20 MED ORDER — SODIUM CHLORIDE 0.9 % IV SOLN
Freq: Once | INTRAVENOUS | Status: AC
Start: 2016-10-20 — End: 2016-10-20
  Administered 2016-10-20: 14:00:00 via INTRAVENOUS

## 2016-10-20 MED ORDER — DEXAMETHASONE SODIUM PHOSPHATE 10 MG/ML IJ SOLN
10.0000 mg | Freq: Once | INTRAMUSCULAR | Status: AC
  Administered 2016-10-20: 10 mg via INTRAVENOUS

## 2016-10-20 MED ORDER — SODIUM CHLORIDE 0.9% FLUSH
10.0000 mL | INTRAVENOUS | Status: DC | PRN
  Administered 2016-10-20: 10 mL
  Filled 2016-10-20: qty 10

## 2016-10-20 MED ORDER — DEXAMETHASONE SODIUM PHOSPHATE 10 MG/ML IJ SOLN
INTRAMUSCULAR | Status: AC
  Filled 2016-10-20: qty 1

## 2016-10-20 MED ORDER — SODIUM CHLORIDE 0.9 % IV SOLN: 10.0000 mg | Freq: Once | INTRAVENOUS | Status: DC

## 2016-10-20 NOTE — Progress Notes (Signed)
Chemotherapy given today per orders. Patient tolerated well, no problems. Vitals stable, discharged home ambulatory from clinic.follow up as scheduled.

## 2016-10-20 NOTE — Patient Instructions (Signed)
[facility] Discharge Instructions for Patients Receiving Chemotherapy   Beginning January 23rd 2017 lab work for the Cancer Center will be done in the  Main lab at [place] on 1st floor. If you have a lab appointment with the Cancer Center please come in thru the  Main Entrance and check in at the main information desk   Today you received the following chemotherapy agents Etoposide  To help prevent nausea and vomiting after your treatment, we encourage you to take your nausea medication   If you develop nausea and vomiting, or diarrhea that is not controlled by your medication, call the clinic.  The clinic phone number is [phone]. Office hours are Monday-Friday 8:30am-5:00pm.  BELOW ARE SYMPTOMS THAT SHOULD BE REPORTED IMMEDIATELY:  *FEVER GREATER THAN 101.0 F  *CHILLS WITH OR WITHOUT FEVER  NAUSEA AND VOMITING THAT IS NOT CONTROLLED WITH YOUR NAUSEA MEDICATION  *UNUSUAL SHORTNESS OF BREATH  *UNUSUAL BRUISING OR BLEEDING  TENDERNESS IN MOUTH AND THROAT WITH OR WITHOUT PRESENCE OF ULCERS  *URINARY PROBLEMS  *BOWEL PROBLEMS  UNUSUAL RASH Items with * indicate a potential emergency and should be followed up as soon as possible. If you have an emergency after office hours please contact your primary care physician or go to the nearest emergency department.  Please call the clinic during office hours if you have any questions or concerns.   You may also contact the Patient Navigator at [phone] should you have any questions or need assistance in obtaining follow up care.      Resources For Cancer Patients and their Caregivers ? American Cancer Society: Can assist with transportation, wigs, general needs, runs Look Good Feel Better.        1-888-227-6333 ? Cancer Care: Provides financial assistance, online support groups, medication/co-pay assistance.  1-800-813-HOPE (4673) ? [facility] Assists [place]  cancer patients and their families through emotional , educational and financial support.  336-427-4357 ? [place] DSS Where to apply for food stamps, Medicaid and utility assistance. 336-342-1394 ? RCATS: Transportation to medical appointments. 336-347-2287 ? Social Security Administration: May apply for disability if have a Stage IV cancer. 336-342-7796 1-800-772-1213 ? [place] Aging, Disability and Transit Services: Assists with nutrition, care and transit needs. 336-349-2343          

## 2016-10-21 ENCOUNTER — Encounter (HOSPITAL_BASED_OUTPATIENT_CLINIC_OR_DEPARTMENT_OTHER): Payer: Managed Care, Other (non HMO)

## 2016-10-21 ENCOUNTER — Encounter (HOSPITAL_COMMUNITY): Payer: Self-pay | Admitting: Hematology & Oncology

## 2016-10-21 ENCOUNTER — Encounter (HOSPITAL_COMMUNITY): Payer: Self-pay

## 2016-10-21 VITALS — BP 166/92 | HR 72 | Temp 97.8°F | Resp 16

## 2016-10-21 DIAGNOSIS — C801 Malignant (primary) neoplasm, unspecified: Secondary | ICD-10-CM

## 2016-10-21 DIAGNOSIS — C3431 Malignant neoplasm of lower lobe, right bronchus or lung: Secondary | ICD-10-CM

## 2016-10-21 DIAGNOSIS — Z5111 Encounter for antineoplastic chemotherapy: Secondary | ICD-10-CM | POA: Diagnosis not present

## 2016-10-21 MED ORDER — SODIUM CHLORIDE 0.9 % IV SOLN: 10.0000 mg | Freq: Once | INTRAVENOUS | Status: DC

## 2016-10-21 MED ORDER — PEGFILGRASTIM 6 MG/0.6ML ~~LOC~~ PSKT
6.0000 mg | PREFILLED_SYRINGE | Freq: Once | SUBCUTANEOUS | Status: AC
  Administered 2016-10-21: 6 mg via SUBCUTANEOUS

## 2016-10-21 MED ORDER — SODIUM CHLORIDE 0.9 % IV SOLN
Freq: Once | INTRAVENOUS | Status: AC
  Administered 2016-10-21: 12:00:00 via INTRAVENOUS

## 2016-10-21 MED ORDER — DEXAMETHASONE SODIUM PHOSPHATE 10 MG/ML IJ SOLN
INTRAMUSCULAR | Status: AC
  Filled 2016-10-21: qty 1

## 2016-10-21 MED ORDER — HEPARIN SOD (PORK) LOCK FLUSH 100 UNIT/ML IV SOLN
500.0000 [IU] | Freq: Once | INTRAVENOUS | Status: AC | PRN
  Administered 2016-10-21: 500 [IU]

## 2016-10-21 MED ORDER — HEPARIN SOD (PORK) LOCK FLUSH 100 UNIT/ML IV SOLN
INTRAVENOUS | Status: AC
  Filled 2016-10-21: qty 5

## 2016-10-21 MED ORDER — DEXAMETHASONE SODIUM PHOSPHATE 10 MG/ML IJ SOLN
10.0000 mg | Freq: Once | INTRAMUSCULAR | Status: AC
  Administered 2016-10-21: 10 mg via INTRAVENOUS

## 2016-10-21 MED ORDER — SODIUM CHLORIDE 0.9 % IV SOLN
100.0000 mg/m2 | Freq: Once | INTRAVENOUS | Status: AC
  Administered 2016-10-21: 170 mg via INTRAVENOUS
  Filled 2016-10-21: qty 8.5

## 2016-10-21 MED ORDER — SODIUM CHLORIDE 0.9% FLUSH
10.0000 mL | INTRAVENOUS | Status: DC | PRN
  Administered 2016-10-21: 10 mL
  Filled 2016-10-21: qty 10

## 2016-10-21 MED ORDER — PEGFILGRASTIM 6 MG/0.6ML ~~LOC~~ PSKT
PREFILLED_SYRINGE | SUBCUTANEOUS | Status: AC
  Filled 2016-10-21: qty 0.6

## 2016-10-21 NOTE — Progress Notes (Signed)
Chemotherapy given today per orders, patient tolerated it well, no problems. Vitals stable. neulasta onpro device administered, instructed patient medicaine will be given around 4:30-515 pm tomorrow. Patient understood. Discharged home ambulatory from clinic.

## 2016-10-21 NOTE — Patient Instructions (Signed)
[facility] Discharge Instructions for Patients Receiving Chemotherapy   Beginning January 23rd 2017 lab work for the Cancer Center will be done in the  Main lab at [place] on 1st floor. If you have a lab appointment with the Cancer Center please come in thru the  Main Entrance and check in at the main information desk   Today you received the following chemotherapy agents Etoposide  To help prevent nausea and vomiting after your treatment, we encourage you to take your nausea medication   If you develop nausea and vomiting, or diarrhea that is not controlled by your medication, call the clinic.  The clinic phone number is [phone]. Office hours are Monday-Friday 8:30am-5:00pm.  BELOW ARE SYMPTOMS THAT SHOULD BE REPORTED IMMEDIATELY:  *FEVER GREATER THAN 101.0 F  *CHILLS WITH OR WITHOUT FEVER  NAUSEA AND VOMITING THAT IS NOT CONTROLLED WITH YOUR NAUSEA MEDICATION  *UNUSUAL SHORTNESS OF BREATH  *UNUSUAL BRUISING OR BLEEDING  TENDERNESS IN MOUTH AND THROAT WITH OR WITHOUT PRESENCE OF ULCERS  *URINARY PROBLEMS  *BOWEL PROBLEMS  UNUSUAL RASH Items with * indicate a potential emergency and should be followed up as soon as possible. If you have an emergency after office hours please contact your primary care physician or go to the nearest emergency department.  Please call the clinic during office hours if you have any questions or concerns.   You may also contact the Patient Navigator at [phone] should you have any questions or need assistance in obtaining follow up care.      Resources For Cancer Patients and their Caregivers ? American Cancer Society: Can assist with transportation, wigs, general needs, runs Look Good Feel Better.        1-888-227-6333 ? Cancer Care: Provides financial assistance, online support groups, medication/co-pay assistance.  1-800-813-HOPE (4673) ? [facility] Assists [place]  cancer patients and their families through emotional , educational and financial support.  336-427-4357 ? [place] DSS Where to apply for food stamps, Medicaid and utility assistance. 336-342-1394 ? RCATS: Transportation to medical appointments. 336-347-2287 ? Social Security Administration: May apply for disability if have a Stage IV cancer. 336-342-7796 1-800-772-1213 ? [place] Aging, Disability and Transit Services: Assists with nutrition, care and transit needs. 336-349-2343          

## 2016-10-31 ENCOUNTER — Other Ambulatory Visit (HOSPITAL_COMMUNITY): Payer: Self-pay | Admitting: Hematology & Oncology

## 2016-11-09 ENCOUNTER — Encounter (HOSPITAL_COMMUNITY): Payer: Self-pay | Admitting: Oncology

## 2016-11-09 ENCOUNTER — Encounter (HOSPITAL_BASED_OUTPATIENT_CLINIC_OR_DEPARTMENT_OTHER): Payer: Managed Care, Other (non HMO)

## 2016-11-09 ENCOUNTER — Encounter (HOSPITAL_BASED_OUTPATIENT_CLINIC_OR_DEPARTMENT_OTHER): Payer: Managed Care, Other (non HMO) | Admitting: Oncology

## 2016-11-09 VITALS — BP 139/97 | HR 82 | Temp 98.1°F | Resp 18 | Wt 147.5 lb

## 2016-11-09 DIAGNOSIS — Z5111 Encounter for antineoplastic chemotherapy: Secondary | ICD-10-CM

## 2016-11-09 DIAGNOSIS — C801 Malignant (primary) neoplasm, unspecified: Secondary | ICD-10-CM

## 2016-11-09 DIAGNOSIS — C3431 Malignant neoplasm of lower lobe, right bronchus or lung: Secondary | ICD-10-CM | POA: Diagnosis not present

## 2016-11-09 LAB — COMPREHENSIVE METABOLIC PANEL
ALT: 15 U/L (ref 14–54)
AST: 22 U/L (ref 15–41)
Albumin: 4.2 g/dL (ref 3.5–5.0)
Alkaline Phosphatase: 102 U/L (ref 38–126)
Anion gap: 8 (ref 5–15)
BUN: 12 mg/dL (ref 6–20)
CHLORIDE: 101 mmol/L (ref 101–111)
CO2: 27 mmol/L (ref 22–32)
Calcium: 9 mg/dL (ref 8.9–10.3)
Creatinine, Ser: 0.69 mg/dL (ref 0.44–1.00)
Glucose, Bld: 116 mg/dL — ABNORMAL HIGH (ref 65–99)
Potassium: 3.5 mmol/L (ref 3.5–5.1)
SODIUM: 136 mmol/L (ref 135–145)
Total Bilirubin: 0.4 mg/dL (ref 0.3–1.2)
Total Protein: 6.6 g/dL (ref 6.5–8.1)

## 2016-11-09 LAB — CBC WITH DIFFERENTIAL/PLATELET
BASOS ABS: 0 10*3/uL (ref 0.0–0.1)
Basophils Relative: 0 %
EOS ABS: 0.1 10*3/uL (ref 0.0–0.7)
EOS PCT: 2 %
HCT: 36 % (ref 36.0–46.0)
Hemoglobin: 12.3 g/dL (ref 12.0–15.0)
LYMPHS PCT: 17 %
Lymphs Abs: 1 10*3/uL (ref 0.7–4.0)
MCH: 35.1 pg — AB (ref 26.0–34.0)
MCHC: 34.2 g/dL (ref 30.0–36.0)
MCV: 102.9 fL — AB (ref 78.0–100.0)
MONO ABS: 0.3 10*3/uL (ref 0.1–1.0)
Monocytes Relative: 5 %
Neutro Abs: 4.3 10*3/uL (ref 1.7–7.7)
Neutrophils Relative %: 76 %
PLATELETS: 148 10*3/uL — AB (ref 150–400)
RBC: 3.5 MIL/uL — AB (ref 3.87–5.11)
RDW: 15.4 % (ref 11.5–15.5)
WBC: 5.7 10*3/uL (ref 4.0–10.5)

## 2016-11-09 LAB — MAGNESIUM: MAGNESIUM: 1.7 mg/dL (ref 1.7–2.4)

## 2016-11-09 MED ORDER — PALONOSETRON HCL INJECTION 0.25 MG/5ML
0.2500 mg | Freq: Once | INTRAVENOUS | Status: AC
  Administered 2016-11-09: 0.25 mg via INTRAVENOUS
  Filled 2016-11-09: qty 5

## 2016-11-09 MED ORDER — DEXAMETHASONE SODIUM PHOSPHATE 10 MG/ML IJ SOLN
10.0000 mg | Freq: Once | INTRAMUSCULAR | Status: AC
  Administered 2016-11-09: 10 mg via INTRAVENOUS

## 2016-11-09 MED ORDER — LORAZEPAM 2 MG/ML IJ SOLN
0.5000 mg | Freq: Once | INTRAMUSCULAR | Status: AC
  Administered 2016-11-09: 0.5 mg via INTRAVENOUS

## 2016-11-09 MED ORDER — POTASSIUM CHLORIDE 2 MEQ/ML IV SOLN
Freq: Once | INTRAVENOUS | Status: AC
  Administered 2016-11-09: 09:00:00 via INTRAVENOUS
  Filled 2016-11-09: qty 10

## 2016-11-09 MED ORDER — SODIUM CHLORIDE 0.9 % IV SOLN
100.0000 mg/m2 | Freq: Once | INTRAVENOUS | Status: AC
  Administered 2016-11-09: 170 mg via INTRAVENOUS
  Filled 2016-11-09: qty 8.5

## 2016-11-09 MED ORDER — SODIUM CHLORIDE 0.9 % IV SOLN
Freq: Once | INTRAVENOUS | Status: AC
  Administered 2016-11-09: 11:00:00 via INTRAVENOUS

## 2016-11-09 MED ORDER — LORAZEPAM 2 MG/ML IJ SOLN
INTRAMUSCULAR | Status: AC
  Filled 2016-11-09: qty 1

## 2016-11-09 MED ORDER — SODIUM CHLORIDE 0.9% FLUSH
10.0000 mL | INTRAVENOUS | Status: DC | PRN
  Administered 2016-11-09: 10 mL
  Filled 2016-11-09: qty 10

## 2016-11-09 MED ORDER — DEXAMETHASONE SODIUM PHOSPHATE 10 MG/ML IJ SOLN
INTRAMUSCULAR | Status: AC
  Filled 2016-11-09: qty 1

## 2016-11-09 MED ORDER — HEPARIN SOD (PORK) LOCK FLUSH 100 UNIT/ML IV SOLN
500.0000 [IU] | Freq: Once | INTRAVENOUS | Status: AC | PRN
  Administered 2016-11-09: 500 [IU]

## 2016-11-09 MED ORDER — SODIUM CHLORIDE 0.9 % IV SOLN
80.0000 mg/m2 | Freq: Once | INTRAVENOUS | Status: AC
  Administered 2016-11-09: 138 mg via INTRAVENOUS
  Filled 2016-11-09: qty 138

## 2016-11-09 NOTE — Patient Instructions (Addendum)
[facility] at [facility] Discharge Instructions  RECOMMENDATIONS MADE BY THE CONSULTANT AND ANY TEST RESULTS WILL BE SENT TO YOUR REFERRING PHYSICIAN.  You saw Kirby Crigler, PA-C, today. Keep CT appt on 11/23/2016 as scheduled Keep radiation appt on 11/25/2016 as scheduled. Follow up in 3-4 weeks. See Amy at checkout for appointments.  Thank you for choosing [facility] at [facility] to provide your oncology and hematology care.  To afford each patient quality time with our provider, please arrive at least 15 minutes before your scheduled appointment time.   Beginning January 23rd 2017 lab work for the [facility] will be done in the  Main lab at [facility] on 1st floor. If you have a lab appointment with the [facility] please come in thru the  Main Entrance and check in at the main information desk  You need to re-schedule your appointment should you arrive 10 or more minutes late.  We strive to give you quality time with our providers, and arriving late affects you and other patients whose appointments are after yours.  Also, if you no show three or more times for appointments you may be dismissed from the clinic at the providers discretion.     Again, thank you for choosing [facility]  Our hope is that these requests will decrease the amount of time that you wait before being seen by our physicians.       _____________________________________________________________  Should you have questions after your visit to [facility], please contact our office at [phone] between the hours of 8:30 a.m. and 4:30 p.m.  Voicemails left after 4:30 p.m. will not be returned until the following business day.  For prescription refill requests, have your pharmacy contact our office.         Resources For Cancer Patients and their Caregivers ? American Cancer Society: Can assist with transportation, wigs, general needs,  runs Look Good Feel Better.        (226) 109-5589 ? Cancer Care: Provides financial assistance, online support groups, medication/co-pay assistance.  1-800-813-HOPE 646-431-3611) ? [facility] Assists [place] cancer patients and their families through emotional , educational and financial support.  (401)400-5504 ? [place] DSS Where to apply for food stamps, Medicaid and utility assistance. 610-839-6738 ? RCATS: Transportation to medical appointments. 7141437482 ? Social Security Administration: May apply for disability if have a Stage IV cancer. 878 116 0786 6030128472 ? [employer], Disability and Transit Services: Assists with nutrition, care and transit needs. [place] Support Programs: '@10RELATIVEDAYS'$ @ > Cancer Support Group  2nd Tuesday of the month 1pm-2pm, Journey Room  > Creative Journey  3rd Tuesday of the month 1130am-1pm, Journey Room  > Look Good Feel Better  1st Wednesday of the month 10am-12 noon, Journey Room (Call [facility] to register [phone])

## 2016-11-09 NOTE — Progress Notes (Signed)
359ms of urine voided.  Chemotherapy given today per orders. Patient tolerated it well with some nausea present this afternoon. Vitals stable and will given nausea meds as ordered per TKirby CriglerPA-C. Discharged from clinic ambulatory and follow up as scheduled.

## 2016-11-09 NOTE — Patient Instructions (Signed)
[facility] Discharge Instructions for Patients Receiving Chemotherapy   Beginning January 23rd 2017 lab work for the [facility] will be done in the  Main lab at [facility] on 1st floor. If you have a lab appointment with the [facility] please come in thru the  Main Entrance and check in at the main information desk   Today you received the following chemotherapy agents cisplatin, etoposide  To help prevent nausea and vomiting after your treatment, we encourage you to take your nausea medication      If you develop nausea and vomiting, or diarrhea that is not controlled by your medication, call the clinic.  The clinic phone number is [phone]. Office hours are Monday-Friday 8:30am-5:00pm.  BELOW ARE SYMPTOMS THAT SHOULD BE REPORTED IMMEDIATELY:  *FEVER GREATER THAN 101.0 F  *CHILLS WITH OR WITHOUT FEVER  NAUSEA AND VOMITING THAT IS NOT CONTROLLED WITH YOUR NAUSEA MEDICATION  *UNUSUAL SHORTNESS OF BREATH  *UNUSUAL BRUISING OR BLEEDING  TENDERNESS IN MOUTH AND THROAT WITH OR WITHOUT PRESENCE OF ULCERS  *URINARY PROBLEMS  *BOWEL PROBLEMS  UNUSUAL RASH Items with * indicate a potential emergency and should be followed up as soon as possible. If you have an emergency after office hours please contact your primary care physician or go to the nearest emergency department.  Please call the clinic during office hours if you have any questions or concerns.   You may also contact the Patient Navigator at [phone] should you have any questions or need assistance in obtaining follow up care.      Resources For Cancer Patients and their Caregivers ? American Cancer Society: Can assist with transportation, wigs, general needs, runs Look Good Feel Better.        413-440-4726 ? Cancer Care: Provides financial assistance, online support groups, medication/co-pay assistance.  1-800-813-HOPE 908-835-9621) ? [facility] Assists  [place] cancer patients and their families through emotional , educational and financial support.  838-481-7887 ? [place] DSS Where to apply for food stamps, Medicaid and utility assistance. 702 549 0974 ? RCATS: Transportation to medical appointments. 478 472 4789 ? Social Security Administration: May apply for disability if have a Stage IV cancer. 770-335-6676 949-612-9142 ? [employer], Disability and Transit Services: Assists with nutrition, care and transit needs. [phone]

## 2016-11-09 NOTE — Assessment & Plan Note (Addendum)
Small cell lung cancer, limited stage, presenting with SIADH.  For patients with LS-SCLC treated with contemporary chemoradiotherapy and prophylactic cranial irradiation, overall response rates of 80 to 90 percent, including 50 to 60 percent complete response rates, are typically reported. Median survival is around 17 months, and the five-year survival rate is about 20 percent.  Pre-treatment labs: CBC diff, CMET, Magnesium.  I personally reviewed and went over laboratory results with the patient.  The results are noted within this dictation.  Labs satisfy treatment parameters.    She noted nausea with vomiting following last cycle lasting for 72 hours the weekend following completion of treatment.  I will change her pre-meds today to Aloxi + PO Varubi.  Will D/C Emend today.  She was evaluate for XRT following her 4th cycle of chemotherapy and deemed not a candidate at that time due to amount of "tumor burden."  TODAY, she reports that she does not want XRT.  We had a long discussion about this today and I informed her of ~ 20% cure rate WITH chemotherapy AND radiation.  She is also a candidate for PCI.  She is agreeable to learning her options from an XRT standpoint and making a decision based upon provided radiation oncology information.  She is scheduled for CT of chest on 11/24/2015. She reports that she is scheduled on 11/25/2016 for CT simulation at [facility] and this appointment is confirmed this AM.  Return in 3-4 weeks for follow-up.  If ongoing response is noted, will refer the patient back to XRT for re-evaluation.

## 2016-11-09 NOTE — Progress Notes (Signed)
[place] [address]  Small cell carcinoma [facility]) - Plan: CANCELED: CT Chest W Contrast, CANCELED: CT Abdomen Pelvis W Contrast  CURRENT THERAPY: Cisplatin/Etoposide beginning on 07/27/2016  INTERVAL HISTORY: Lydia Moore 55 y.o. female returns for followup of Small cell lung cancer, limited stage, presenting with SIADH.     Small cell carcinoma ([place])   07/12/2016 Imaging    CTA chest 3.9 x 3.5 cm right lower lobe mass with imaging features most compatible with a primary lung carcinoma.Marked metastatic right hilar and mediastinal adenopathy including a confluent mass of adenopathy in the right hilum, encasing and causing marked narrowing of the central pulmonary arteries on the right with a short segment of occlusion of the right lower lobe bronchus.Mild postobstructive changes in the inferior, medial right lower lobe.Mild changes of COPD       07/16/2016 Procedure    Endoscopic bronchoscopy with transbronchial biopsy of #7 nodes and biopsy of RLL lung mass      07/16/2016 Pathology Results    Lung, biopsy, Right Lower Lobe - SMALL CELL CARCINOMA.       07/21/2016 Imaging    MRI brain Negative MRI of the brain. No evidence for metastatic disease to the brain or meninges.      07/26/2016 PET scan    Markedly hypermetabolic right lower lobe lesion with hypermetabolic metastatic disease in the right hilum and mediastinum. 2. Several scattered hypermetabolic ground-glass opacities in the right upper lobe. FDG accumulation within these nodules is higher than typically seen for infectious/inflammatory etiology although this remains within the differential. Atypical appearance of metastatic spread would also be a consideration. 3. Coronary artery atherosclerosis. 4. **An incidental finding of potential clinical significance has been found. 3.8 cm abdominal aortic aneurysm       07/27/2016 -  Chemotherapy    The patient had  palonosetron (ALOXI) injection 0.25 mg, 0.25 mg, Intravenous,  Once, 5 of 6 cycles  pegfilgrastim (NEULASTA ONPRO KIT) injection 6 mg, 6 mg, Subcutaneous,  Once, 1 of 1 cycle  pegfilgrastim (NEULASTA ONPRO KIT) injection 6 mg, 6 mg, Subcutaneous, Once, 4 of 5 cycles  CISplatin (PLATINOL) 138 mg in sodium chloride 0.9 % 500 mL chemo infusion, 80 mg/m2 = 138 mg, Intravenous,  Once, 5 of 6 cycles  etoposide (VEPESID) 170 mg in sodium chloride 0.9 % 500 mL chemo infusion, 100 mg/m2 = 170 mg, Intravenous,  Once, 5 of 6 cycles  fosaprepitant (EMEND) 150 mg, dexamethasone (DECADRON) 12 mg in sodium chloride 0.9 % 145 mL IVPB, , Intravenous,  Once, 5 of 6 cycles  for chemotherapy treatment.  Cisplatin/Etoposide       She has been tolerating treatment well.  HOWEVER, she noted a 72 hours window of nausea with vomiting the weekend following chemotherapy last cycle.  She noted that particular odors cause nausea followed by vomiting.  She has been doing well and this has not been an issue for her in the past.  Her weight is down about 1.5 lbs.  She is resistant to XRT.  "I have seen 3 different radiation doctors and all of them report that the risk is higher than the benefit."  She is educated that those discussions were about her malignancy at that time.  We hope that her upcoming imaging will continue to demonstrate a positive response to therapy and the risks decrease and the benefits increase.    She notes that she is most interested  in quality of life versus quantity.  Review of Systems  Constitutional: Positive for weight loss. Negative for chills and fever.  HENT: Negative.   Eyes: Negative.   Respiratory: Negative.  Negative for cough.   Cardiovascular: Negative.  Negative for chest pain.  Gastrointestinal: Positive for nausea (following her last tx.) and vomiting (following her last tx.). Negative for constipation and diarrhea.  Genitourinary: Negative.  Negative for dysuria.    Musculoskeletal: Negative.   Skin: Negative.   Neurological: Negative.  Negative for weakness.  Endo/Heme/Allergies: Negative.   Psychiatric/Behavioral: Negative.     Past Medical History:  Diagnosis Date  . Elevated cholesterol   . GERD (gastroesophageal reflux disease)   . GI bleed   . Hypertension   . Small cell carcinoma ([place]) 07/21/2016    Past Surgical History:  Procedure Laterality Date  . ABDOMINAL HYSTERECTOMY     partial  . CESAREAN SECTION    . COLONOSCOPY    . PORTACATH PLACEMENT Left 08/04/2016   Procedure: INSERTION PORT-A-CATH LEFT SUBCLAVIAN;  Surgeon: Aviva Signs, MD;  Location: [facility];  Service: General;  Laterality: Left;  . TONSILLECTOMY    . VIDEO BRONCHOSCOPY WITH ENDOBRONCHIAL ULTRASOUND N/A 07/16/2016   Procedure: VIDEO BRONCHOSCOPY WITH ENDOBRONCHIAL ULTRASOUND with ultrasound transbronchial biopsy of node #7 and right lower lobe lesion;  Surgeon: Grace Isaac, MD;  Location[facility] OR;  Service: Thoracic;  Laterality: N/A;    Family History  Problem Relation Age of Onset  . Heart disease Mother   . Heart disease Father   . COPD Sister   . HIV Brother     Social History   Social History  . Marital status: Married    Spouse name: N/A  . Number of children: 4  . Years of education: N/A   Occupational History  . [employer]   Social History Main Topics  . Smoking status: Current Every Day Smoker    Packs/day: 0.50    Years: 35.00    Types: Cigarettes  . Smokeless tobacco: Never Used  . Alcohol use Yes     Comment: occasionally, Couple drinks per mo  . Drug use:     Frequency: 1.0 time per week    Types: Marijuana     Comment: weekly  . Sexual activity: Yes    Birth control/ protection: Surgical     Comment: married   Other Topics Concern  . None   Social History Narrative   Lives w/ husband     PHYSICAL EXAMINATION  ECOG PERFORMANCE STATUS: 1 - Symptomatic but completely ambulatory  There were no vitals filed for  this visit.  Vitals - 1 value per visit 81/19/1478  SYSTOLIC 295  DIASTOLIC 90  Pulse 82  Temperature 98.2  Respirations 18  Weight (lb) 147.5    GENERAL:alert, no distress, well nourished, well developed, comfortable, cooperative, smiling and unaccompanied, in chemobed SKIN: skin color, texture, turgor are normal, no rashes or significant lesions HEAD: Normocephalic, No masses, lesions, tenderness or abnormalities EYES: normal, EOMI, Conjunctiva are pink and non-injected EARS: External ears normal OROPHARYNX:lips, buccal mucosa, and tongue normal and mucous membranes are moist  NECK: supple, trachea midline LYMPH:  no palpable lymphadenopathy BREAST:not examined LUNGS: clear to auscultation  HEART: regular rate & rhythm, no murmurs and no gallops ABDOMEN:abdomen soft and normal bowel sounds BACK: Back symmetric, no curvature. EXTREMITIES:less then 2 second capillary refill, no joint deformities, effusion, or inflammation, no skin discoloration, no cyanosis  NEURO: alert & oriented x  3 with fluent speech, no focal motor/sensory deficits, gait normal   LABORATORY DATA: CBC    Component Value Date/Time   WBC 5.7 11/09/2016 0859   RBC 3.50 (L) 11/09/2016 0859   HGB 12.3 11/09/2016 0859   HCT 36.0 11/09/2016 0859   PLT 148 (L) 11/09/2016 0859   MCV 102.9 (H) 11/09/2016 0859   MCH 35.1 (H) 11/09/2016 0859   MCHC 34.2 11/09/2016 0859   RDW 15.4 11/09/2016 0859   LYMPHSABS 1.0 11/09/2016 0859   MONOABS 0.3 11/09/2016 0859   EOSABS 0.1 11/09/2016 0859   BASOSABS 0.0 11/09/2016 0859      Chemistry      Component Value Date/Time   NA 136 11/09/2016 0859   K 3.5 11/09/2016 0859   CL 101 11/09/2016 0859   CO2 27 11/09/2016 0859   BUN 12 11/09/2016 0859   CREATININE 0.69 11/09/2016 0859      Component Value Date/Time   CALCIUM 9.0 11/09/2016 0859   ALKPHOS 102 11/09/2016 0859   AST 22 11/09/2016 0859   ALT 15 11/09/2016 0859   BILITOT 0.4 11/09/2016 0859         PENDING LABS:   RADIOGRAPHIC STUDIES:  No results found.   PATHOLOGY:    ASSESSMENT AND PLAN:  Small cell carcinoma (HCC) Small cell lung cancer, limited stage, presenting with SIADH.  For patients with LS-SCLC treated with contemporary chemoradiotherapy and prophylactic cranial irradiation, overall response rates of 80 to 90 percent, including 50 to 60 percent complete response rates, are typically reported. Median survival is around 17 months, and the five-year survival rate is about 20 percent.  Pre-treatment labs: CBC diff, CMET, Magnesium.  I personally reviewed and went over laboratory results with the patient.  The results are noted within this dictation.  Labs satisfy treatment parameters.    She noted nausea with vomiting following last cycle lasting for 72 hours the weekend following completion of treatment.  I will change her pre-meds today to Aloxi + PO Varubi.  Will D/C Emend today.  She was evaluate for XRT following her 4th cycle of chemotherapy and deemed not a candidate at that time due to amount of "tumor burden."  TODAY, she reports that she does not want XRT.  We had a long discussion about this today and I informed her of ~ 20% cure rate WITH chemotherapy AND radiation.  She is also a candidate for PCI.  She is agreeable to learning her options from an XRT standpoint and making a decision based upon provided radiation oncology information.  She is scheduled for CT of chest on 11/24/2015. She reports that she is scheduled on 11/25/2016 for CT simulation at [facility] and this appointment is confirmed this AM.  Return in 3-4 weeks for follow-up.  If ongoing response is noted, will refer the patient back to XRT for re-evaluation.    ORDERS PLACED FOR THIS ENCOUNTER: No orders of the defined types were placed in this encounter.   MEDICATIONS PRESCRIBED THIS ENCOUNTER: No orders of the defined types were placed in this encounter.   THERAPY PLAN:  Ongoing  systemic chemotherapy.  She has restaging imaging scheduled on 11/23/2016 with an appointment with radiation oncology on 11/25/2016.  All questions were answered. The patient knows to call the clinic with any problems, questions or concerns. We can certainly see the patient much sooner if necessary.  Patient and plan discussed with Dr. Ancil Linsey and she is in agreement with the aforementioned.   This note is  electronically signed by: Doy Mince 11/09/2016 9:33 AM

## 2016-11-10 ENCOUNTER — Encounter (HOSPITAL_BASED_OUTPATIENT_CLINIC_OR_DEPARTMENT_OTHER): Payer: Managed Care, Other (non HMO)

## 2016-11-10 VITALS — BP 161/95 | HR 92 | Temp 98.2°F | Resp 18

## 2016-11-10 DIAGNOSIS — Z5111 Encounter for antineoplastic chemotherapy: Secondary | ICD-10-CM

## 2016-11-10 DIAGNOSIS — C3431 Malignant neoplasm of lower lobe, right bronchus or lung: Secondary | ICD-10-CM | POA: Diagnosis not present

## 2016-11-10 DIAGNOSIS — C801 Malignant (primary) neoplasm, unspecified: Secondary | ICD-10-CM

## 2016-11-10 MED ORDER — LORAZEPAM 2 MG/ML IJ SOLN
INTRAMUSCULAR | Status: AC
  Filled 2016-11-10: qty 1

## 2016-11-10 MED ORDER — SODIUM CHLORIDE 0.9% FLUSH
10.0000 mL | INTRAVENOUS | Status: DC | PRN
  Administered 2016-11-10: 10 mL
  Filled 2016-11-10: qty 10

## 2016-11-10 MED ORDER — DEXAMETHASONE SODIUM PHOSPHATE 100 MG/10ML IJ SOLN: 10.0000 mg | Freq: Once | INTRAMUSCULAR | Status: DC

## 2016-11-10 MED ORDER — SODIUM CHLORIDE 0.9 % IV SOLN
Freq: Once | INTRAVENOUS | Status: AC
  Administered 2016-11-10: 11:00:00 via INTRAVENOUS

## 2016-11-10 MED ORDER — LORAZEPAM 2 MG/ML IJ SOLN
0.5000 mg | Freq: Once | INTRAMUSCULAR | Status: AC
  Administered 2016-11-10: 0.5 mg via INTRAVENOUS

## 2016-11-10 MED ORDER — DEXAMETHASONE SODIUM PHOSPHATE 10 MG/ML IJ SOLN
10.0000 mg | Freq: Once | INTRAMUSCULAR | Status: AC
  Administered 2016-11-10: 10 mg via INTRAVENOUS
  Filled 2016-11-10: qty 1

## 2016-11-10 MED ORDER — SODIUM CHLORIDE 0.9 % IV SOLN
100.0000 mg/m2 | Freq: Once | INTRAVENOUS | Status: AC
  Administered 2016-11-10: 170 mg via INTRAVENOUS
  Filled 2016-11-10: qty 8.5

## 2016-11-10 MED ORDER — HEPARIN SOD (PORK) LOCK FLUSH 100 UNIT/ML IV SOLN
500.0000 [IU] | Freq: Once | INTRAVENOUS | Status: AC | PRN
  Administered 2016-11-10: 500 [IU]
  Filled 2016-11-10: qty 5

## 2016-11-10 NOTE — Patient Instructions (Signed)
[facility] Discharge Instructions for Patients Receiving Chemotherapy   Beginning January 23rd 2017 lab work for the [facility] will be done in the  Main lab at [facility] on 1st floor. If you have a lab appointment with the [facility] please come in thru the  Main Entrance and check in at the main information desk   Today you received the following chemotherapy agents VP-16. Follow-up as scheduled. Call clinic for any questions or concerns  To help prevent nausea and vomiting after your treatment, we encourage you to take your nausea medication   If you develop nausea and vomiting, or diarrhea that is not controlled by your medication, call the clinic.  The clinic phone number is [phone]. Office hours are Monday-Friday 8:30am-5:00pm.  BELOW ARE SYMPTOMS THAT SHOULD BE REPORTED IMMEDIATELY:  *FEVER GREATER THAN 101.0 F  *CHILLS WITH OR WITHOUT FEVER  NAUSEA AND VOMITING THAT IS NOT CONTROLLED WITH YOUR NAUSEA MEDICATION  *UNUSUAL SHORTNESS OF BREATH  *UNUSUAL BRUISING OR BLEEDING  TENDERNESS IN MOUTH AND THROAT WITH OR WITHOUT PRESENCE OF ULCERS  *URINARY PROBLEMS  *BOWEL PROBLEMS  UNUSUAL RASH Items with * indicate a potential emergency and should be followed up as soon as possible. If you have an emergency after office hours please contact your primary care physician or go to the nearest emergency department.  Please call the clinic during office hours if you have any questions or concerns.   You may also contact the Patient Navigator at [phone] should you have any questions or need assistance in obtaining follow up care.      Resources For Cancer Patients and their Caregivers ? American Cancer Society: Can assist with transportation, wigs, general needs, runs Look Good Feel Better.        954-329-1305 ? Cancer Care: Provides financial assistance, online support groups, medication/co-pay assistance.  1-800-813-HOPE  6301042618) ? [place] Assists [place] cancer patients and their families through emotional , educational and financial support.  (859)737-5834 ? [place] DSS Where to apply for food stamps, Medicaid and utility assistance. 206-484-3912 ? [employer]: Transportation to medical appointments. (404)452-6685 ? Social Security Administration: May apply for disability if have a Stage IV cancer. ([phone] 980-663-2234 ? [employer], Disability and Transit Services: Assists with nutrition, care and transit needs. [phone]

## 2016-11-10 NOTE — Progress Notes (Signed)
Clovia Cuff tolerated chemo tx well without complaints or incident. Pt nauseated and vomited x 1 prior to tx. Ativan 0.5 mg given IV per PA orders. Pt felt better upon discharge. VSS upon discharge. Port left accessed,saline locked and flushed per protocol for use tomorrow. Pt discharged self ambulatory in satisfactory condition

## 2016-11-11 ENCOUNTER — Encounter (HOSPITAL_BASED_OUTPATIENT_CLINIC_OR_DEPARTMENT_OTHER): Payer: Managed Care, Other (non HMO)

## 2016-11-11 VITALS — BP 174/94 | HR 79 | Temp 98.3°F | Resp 18 | Wt 144.6 lb

## 2016-11-11 DIAGNOSIS — Z5189 Encounter for other specified aftercare: Secondary | ICD-10-CM

## 2016-11-11 DIAGNOSIS — Z5111 Encounter for antineoplastic chemotherapy: Secondary | ICD-10-CM

## 2016-11-11 DIAGNOSIS — C3431 Malignant neoplasm of lower lobe, right bronchus or lung: Secondary | ICD-10-CM

## 2016-11-11 DIAGNOSIS — C801 Malignant (primary) neoplasm, unspecified: Secondary | ICD-10-CM

## 2016-11-11 MED ORDER — HEPARIN SOD (PORK) LOCK FLUSH 100 UNIT/ML IV SOLN
500.0000 [IU] | Freq: Once | INTRAVENOUS | Status: AC | PRN
  Administered 2016-11-11: 500 [IU]
  Filled 2016-11-11: qty 5

## 2016-11-11 MED ORDER — SODIUM CHLORIDE 0.9 % IV SOLN
Freq: Once | INTRAVENOUS | Status: AC
  Administered 2016-11-11: 12:00:00 via INTRAVENOUS

## 2016-11-11 MED ORDER — DEXAMETHASONE SODIUM PHOSPHATE 10 MG/ML IJ SOLN
INTRAMUSCULAR | Status: AC
  Filled 2016-11-11: qty 1

## 2016-11-11 MED ORDER — DEXAMETHASONE SODIUM PHOSPHATE 10 MG/ML IJ SOLN
10.0000 mg | Freq: Once | INTRAMUSCULAR | Status: AC
Start: 2016-11-11 — End: 2016-11-11
  Administered 2016-11-11: 10 mg via INTRAVENOUS

## 2016-11-11 MED ORDER — LORAZEPAM 2 MG/ML IJ SOLN
1.0000 mg | Freq: Once | INTRAMUSCULAR | Status: AC
  Administered 2016-11-11: 1 mg via INTRAVENOUS

## 2016-11-11 MED ORDER — SODIUM CHLORIDE 0.9 % IV SOLN
100.0000 mg/m2 | Freq: Once | INTRAVENOUS | Status: AC
  Administered 2016-11-11: 170 mg via INTRAVENOUS
  Filled 2016-11-11: qty 8.5

## 2016-11-11 MED ORDER — PEGFILGRASTIM 6 MG/0.6ML ~~LOC~~ PSKT
PREFILLED_SYRINGE | SUBCUTANEOUS | Status: AC
  Filled 2016-11-11: qty 0.6

## 2016-11-11 MED ORDER — LORAZEPAM 2 MG/ML IJ SOLN
INTRAMUSCULAR | Status: AC
  Filled 2016-11-11: qty 1

## 2016-11-11 MED ORDER — PEGFILGRASTIM 6 MG/0.6ML ~~LOC~~ PSKT
6.0000 mg | PREFILLED_SYRINGE | Freq: Once | SUBCUTANEOUS | Status: AC
  Administered 2016-11-11: 6 mg via SUBCUTANEOUS

## 2016-11-11 MED ORDER — SODIUM CHLORIDE 0.9 % IV SOLN: 10.0000 mg | Freq: Once | INTRAVENOUS | Status: DC

## 2016-11-11 MED ORDER — SODIUM CHLORIDE 0.9% FLUSH: 10.0000 mL | INTRAVENOUS | Status: DC | PRN

## 2016-11-11 NOTE — Patient Instructions (Signed)
[facility] Discharge Instructions for Patients Receiving Chemotherapy   Beginning January 23rd 2017 lab work for the [facility] will be done in the  Main lab at [facility] on 1st floor. If you have a lab appointment with the [place] please come in thru the  Main Entrance and check in at the main information desk   Today you received the following chemotherapy agents:  etoposide  To help prevent nausea and vomiting after your treatment, we encourage you to take your nausea medication as prescribed.  If you develop nausea and vomiting, or diarrhea that is not controlled by your medication, call the clinic.  The clinic phone number is [phone]. Office hours are Monday-Friday 8:30am-5:00pm.  BELOW ARE SYMPTOMS THAT SHOULD BE REPORTED IMMEDIATELY:  *FEVER GREATER THAN 101.0 F  *CHILLS WITH OR WITHOUT FEVER  NAUSEA AND VOMITING THAT IS NOT CONTROLLED WITH YOUR NAUSEA MEDICATION  *UNUSUAL SHORTNESS OF BREATH  *UNUSUAL BRUISING OR BLEEDING  TENDERNESS IN MOUTH AND THROAT WITH OR WITHOUT PRESENCE OF ULCERS  *URINARY PROBLEMS  *BOWEL PROBLEMS  UNUSUAL RASH Items with * indicate a potential emergency and should be followed up as soon as possible. If you have an emergency after office hours please contact your primary care physician or go to the nearest emergency department.  Please call the clinic during office hours if you have any questions or concerns.   You may also contact the Patient Navigator at [phone] should you have any questions or need assistance in obtaining follow up care.      Resources For Cancer Patients and their Caregivers ? American Cancer Society: Can assist with transportation, wigs, general needs, runs Look Good Feel Better.        818-799-2154 ? Cancer Care: Provides financial assistance, online support groups, medication/co-pay assistance.  1-800-813-HOPE (870) 875-7882) ? [place] Assists  [place] cancer patients and their families through emotional , educational and financial support.  951-120-3013 ? [place] DSS Where to apply for food stamps, Medicaid and utility assistance. 223-048-5108 ? [employer]: Transportation to medical appointments. 250-632-5922 ? Social Security Administration: May apply for disability if have a Stage IV cancer. [phone] 970-092-6595 ? [employer], Disability and Transit Services: Assists with nutrition, care and transit needs. [phone]

## 2016-11-11 NOTE — Progress Notes (Signed)
1150:  C/o nausea and pt actively "dry heaving".  MD notified and orders rec'd.  1215:  Pt reports nausea improved.    Tolerated tx w/o adverse reaction.  Alert, in no distress.  VSS.  Discharged ambulatory in c/o daughter.

## 2016-11-18 ENCOUNTER — Other Ambulatory Visit (HOSPITAL_COMMUNITY): Payer: Self-pay | Admitting: *Deleted

## 2016-11-23 ENCOUNTER — Ambulatory Visit (HOSPITAL_COMMUNITY)
Admission: RE | Admit: 2016-11-23 | Discharge: 2016-11-23 | Disposition: A | Discharge status: Home / Self Care | Payer: Managed Care, Other (non HMO) | Source: Ambulatory Visit | Attending: Hematology & Oncology | Admitting: Hematology & Oncology

## 2016-11-23 DIAGNOSIS — R590 Localized enlarged lymph nodes: Secondary | ICD-10-CM | POA: Diagnosis not present

## 2016-11-23 DIAGNOSIS — C801 Malignant (primary) neoplasm, unspecified: Secondary | ICD-10-CM | POA: Diagnosis not present

## 2016-11-23 DIAGNOSIS — I700 Atherosclerosis of aorta: Secondary | ICD-10-CM | POA: Insufficient documentation

## 2016-11-23 MED ORDER — IOPAMIDOL (ISOVUE-300) INJECTION 61%
75.0000 mL | Freq: Once | INTRAVENOUS | Status: AC | PRN
  Administered 2016-11-23: 75 mL via INTRAVENOUS

## 2016-12-09 ENCOUNTER — Encounter (HOSPITAL_COMMUNITY): Payer: Managed Care, Other (non HMO) | Attending: Hematology & Oncology | Admitting: Hematology & Oncology

## 2016-12-09 ENCOUNTER — Encounter (HOSPITAL_COMMUNITY): Payer: Self-pay | Admitting: Hematology & Oncology

## 2016-12-09 VITALS — BP 159/97 | HR 77 | Temp 98.2°F | Resp 16 | Wt 150.4 lb

## 2016-12-09 DIAGNOSIS — Z720 Tobacco use: Secondary | ICD-10-CM | POA: Diagnosis not present

## 2016-12-09 DIAGNOSIS — R110 Nausea: Secondary | ICD-10-CM | POA: Diagnosis not present

## 2016-12-09 DIAGNOSIS — C3431 Malignant neoplasm of lower lobe, right bronchus or lung: Secondary | ICD-10-CM | POA: Diagnosis not present

## 2016-12-09 DIAGNOSIS — C801 Malignant (primary) neoplasm, unspecified: Secondary | ICD-10-CM | POA: Insufficient documentation

## 2016-12-09 MED ORDER — ONDANSETRON HCL 8 MG PO TABS: 8.0000 mg | ORAL_TABLET | Freq: Two times a day (BID) | ORAL | 2 refills | Status: DC | PRN

## 2016-12-09 MED ORDER — ZOLPIDEM TARTRATE 10 MG PO TABS: 10.0000 mg | ORAL_TABLET | Freq: Every evening | ORAL | 3 refills | Status: DC | PRN

## 2016-12-09 NOTE — Progress Notes (Signed)
[facility]  Progress Note  Patient Care Team: [facility] as PCP - General Danie Binder, MD (Gastroenterology)  CHIEF COMPLAINTS/PURPOSE OF CONSULTATION:  Small cell lung cancer. Limited stage   Small cell carcinoma (HCC)   07/12/2016 Imaging    CTA chest 3.9 x 3.5 cm right lower lobe mass with imaging features most compatible with a primary lung carcinoma.Marked metastatic right hilar and mediastinal adenopathy including a confluent mass of adenopathy in the right hilum, encasing and causing marked narrowing of the central pulmonary arteries on the right with a short segment of occlusion of the right lower lobe bronchus.Mild postobstructive changes in the inferior, medial right lower lobe.Mild changes of COPD       07/16/2016 Procedure    Endoscopic bronchoscopy with transbronchial biopsy of #7 nodes and biopsy of RLL lung mass      07/16/2016 Pathology Results    Lung, biopsy, Right Lower Lobe - SMALL CELL CARCINOMA.       07/21/2016 Imaging    MRI brain Negative MRI of the brain. No evidence for metastatic disease to the brain or meninges.      07/26/2016 PET scan    Markedly hypermetabolic right lower lobe lesion with hypermetabolic metastatic disease in the right hilum and mediastinum. 2. Several scattered hypermetabolic ground-glass opacities in the right upper lobe. FDG accumulation within these nodules is higher than typically seen for infectious/inflammatory etiology although this remains within the differential. Atypical appearance of metastatic spread would also be a consideration. 3. Coronary artery atherosclerosis. 4. **An incidental finding of potential clinical significance has been found. 3.8 cm abdominal aortic aneurysm       07/27/2016 -  Chemotherapy    The patient had palonosetron (ALOXI) injection 0.25 mg, 0.25 mg, Intravenous,  Once, 5 of 6 cycles  pegfilgrastim (NEULASTA ONPRO KIT) injection 6 mg, 6 mg, Subcutaneous,   Once, 1 of 1 cycle  pegfilgrastim (NEULASTA ONPRO KIT) injection 6 mg, 6 mg, Subcutaneous, Once, 4 of 5 cycles  CISplatin (PLATINOL) 138 mg in sodium chloride 0.9 % 500 mL chemo infusion, 80 mg/m2 = 138 mg, Intravenous,  Once, 5 of 6 cycles  etoposide (VEPESID) 170 mg in sodium chloride 0.9 % 500 mL chemo infusion, 100 mg/m2 = 170 mg, Intravenous,  Once, 5 of 6 cycles  fosaprepitant (EMEND) 150 mg, dexamethasone (DECADRON) 12 mg in sodium chloride 0.9 % 145 mL IVPB, , Intravenous,  Once, 5 of 6 cycles  for chemotherapy treatment.  Cisplatin/Etoposide      11/23/2016 Imaging    CT chest with No new or progressive findings in the chest. 2. Further interval decrease in size of mediastinal lymphadenopathy. 3. Only trace scarring visible in the right lower lobe, at the location of the previous right lower lobe mass. 4. Coronary artery and thoracoabdominal aortic atherosclerosis      HISTORY OF PRESENTING ILLNESS:  Lydia Moore 56 y.o. female is here for additional follow-up of limited stage SCLC. She did not undergo XRT to the chest or do WBRT.   Patient is accompanied by her daughter for follow-up.  I personally reviewed and went over scans with the patient.  Since our last visit she has gone back to work  States a little nausea every once in a while.  She notes her appetite is back.  States she is still smoking.  She stopped smoking in the house. She smokes about only half a cigarette when she does.  She requested refills on her  Ambien.  She notes she only has about 6 pills left of her nausea pills.   She doesn't have any other questions or concerns at this time.   MEDICAL HISTORY:  Past Medical History:  Diagnosis Date  . Elevated cholesterol   . GERD (gastroesophageal reflux disease)   . GI bleed   . Hypertension   . Small cell carcinoma ([place]) 07/21/2016    SURGICAL HISTORY: Past Surgical History:  Procedure Laterality Date  . ABDOMINAL HYSTERECTOMY     partial    . CESAREAN SECTION    . COLONOSCOPY    . PORTACATH PLACEMENT Left 08/04/2016   Procedure: INSERTION PORT-A-CATH LEFT SUBCLAVIAN;  Surgeon: Aviva Signs, MD;  Location: [facility];  Service: General;  Laterality: Left;  . TONSILLECTOMY    . VIDEO BRONCHOSCOPY WITH ENDOBRONCHIAL ULTRASOUND N/A 07/16/2016   Procedure: VIDEO BRONCHOSCOPY WITH ENDOBRONCHIAL ULTRASOUND with ultrasound transbronchial biopsy of node #7 and right lower lobe lesion;  Surgeon: Grace Isaac, MD;  Location: [facility] OR;  Service: Thoracic;  Laterality: N/A;    SOCIAL HISTORY: Social History   Social History  . Marital status: Married    Spouse name: N/A  . Number of children: 4  . Years of education: N/A   Occupational History  . [employer]   Social History Main Topics  . Smoking status: Current Every Day Smoker    Packs/day: 0.50    Years: 35.00    Types: Cigarettes  . Smokeless tobacco: Never Used  . Alcohol use Yes     Comment: occasionally, Couple drinks per mo  . Drug use: Yes    Frequency: 1.0 time per week    Types: Marijuana     Comment: weekly  . Sexual activity: Yes    Birth control/ protection: Surgical     Comment: married   Other Topics Concern  . Not on file   Social History Narrative   Lives w/ husband   Married 36 years 4 children 10 grandchildren Works as a Metallurgist, 1 ppd. She wants to quit but now is not the time. ETOH, none.  FAMILY HISTORY: Family History  Problem Relation Age of Onset  . Heart disease Mother   . Heart disease Father   . COPD Sister   . HIV Brother    Mother deceased at 70 yo of heart disease Father still living at 46 yo 2 sisters living. 1 brother deceased of HIV at 105 yo  ALLERGIES:  has No Known Allergies.  MEDICATIONS:  Current Outpatient Prescriptions  Medication Sig Dispense Refill  . acetaminophen (TYLENOL) 500 MG tablet Take 1,000 mg by mouth every 6 (six) hours as needed for mild pain, fever or headache.    Marland Kitchen  amLODipine (NORVASC) 5 MG tablet Take 1 tablet (5 mg total) by mouth daily. 30 tablet 0  . CISPLATIN IV Inject into the vein. Every 21 days    . ETOPOSIDE IV Inject into the vein. Every 21 days    . HYDROcodone-acetaminophen (NORCO) 5-325 MG tablet Take 1-2 tablets by mouth every 4 (four) hours as needed for moderate pain. 30 tablet 0  . lidocaine-prilocaine (EMLA) cream Apply to affected area once 30 g 3  . loratadine (CLARITIN) 10 MG tablet TAKE 1 TABLET BY MOUTH EVERY DAY 30 tablet 2  . magnesium oxide (MAG-OX) 400 (241.3 Mg) MG tablet Take 1 tablet (400 mg total) by mouth 2 (two) times daily. 60 tablet 2  . omeprazole (PRILOSEC) 40 MG capsule Take  1 capsule (40 mg total) by mouth daily. 30 capsule 3  . ondansetron (ZOFRAN) 8 MG tablet Take 1 tablet (8 mg total) by mouth 2 (two) times daily as needed. Start on the third day after cisplatin chemotherapy. 60 tablet 2  . Pegfilgrastim (NEULASTA ONPRO [place]) Inject into the skin. Every 21 days    . potassium chloride SA (K-DUR,KLOR-CON) 20 MEQ tablet Take 0.5 tablets (10 mEq total) by mouth 2 (two) times daily. 60 tablet 1  . prochlorperazine (COMPAZINE) 10 MG tablet Take 1 tablet (10 mg total) by mouth every 6 (six) hours as needed (Nausea or vomiting). 30 tablet 1  . Pseudoeph-Doxylamine-DM-APAP (NYQUIL PO) Take 30 mLs by mouth at bedtime as needed (sleep).    . ranitidine (ZANTAC) 150 MG tablet Take 1 tablet (150 mg total) by mouth at bedtime. 30 tablet 3  . varenicline (CHANTIX STARTING MONTH PAK) 0.5 MG X 11 & 1 MG X 42 tablet Take one 0.5 mg tablet by mouth once daily for 3 days, then increase to one 0.5 mg tablet twice daily for 4 days, then increase to one 1 mg tablet twice daily. 53 tablet 0  . zolpidem (AMBIEN) 10 MG tablet Take 1 tablet (10 mg total) by mouth at bedtime as needed for sleep. 30 tablet 3   No current facility-administered medications for this visit.     Review of Systems  Constitutional:       Her appetite is back.  Eyes:  Negative.   Cardiovascular: Negative.   Gastrointestinal: Positive for nausea (once in a while, alleviated by her nausea medicine).  Genitourinary: Negative.   Musculoskeletal: Negative.   Skin: Negative.   Neurological: Negative for headaches.  Endo/Heme/Allergies: Negative.   Psychiatric/Behavioral: Negative.   All other systems reviewed and are negative. 14 point ROS was done and is otherwise as detailed above or in HPI   PHYSICAL EXAMINATION: ECOG PERFORMANCE STATUS: 1 - Symptomatic but completely ambulatory    Vitals with BMI 12/09/2016  Height   Weight 150 lbs 6 oz  BMI   Systolic 967  Diastolic 97  Pulse 77  Respirations 16    Physical Exam  Constitutional: She is oriented to person, place, and time and well-developed, well-nourished, and in no distress.  HENT:  Head: Normocephalic and atraumatic.  Nose: Nose normal.  Mouth/Throat: Oropharynx is clear and moist. No oropharyngeal exudate.  Eyes: Conjunctivae and EOM are normal. Pupils are equal, round, and reactive to light. Right eye exhibits no discharge. Left eye exhibits no discharge. No scleral icterus.  Neck: Normal range of motion. Neck supple. No tracheal deviation present. No thyromegaly present.  Cardiovascular: Normal rate, regular rhythm and normal heart sounds.  Exam reveals no gallop and no friction rub.   No murmur heard. Pulmonary/Chest: Effort normal. She has no wheezes. She has no rales.  Abdominal: Soft. Bowel sounds are normal. She exhibits no distension and no mass. There is no tenderness. There is no rebound and no guarding.  Musculoskeletal: Normal range of motion. She exhibits no edema.  Lymphadenopathy:    She has no cervical adenopathy.  Neurological: She is alert and oriented to person, place, and time. She has normal reflexes. No cranial nerve deficit. Gait normal. Coordination normal.  Skin: Skin is warm and dry. No rash noted.  Psychiatric: Mood, memory, affect and judgment normal.   Nursing note and vitals reviewed.   LABORATORY DATA:  I have reviewed the data as listed Lab Results  Component Value Date  WBC 5.7 11/09/2016   HGB 12.3 11/09/2016   HCT 36.0 11/09/2016   MCV 102.9 (H) 11/09/2016   PLT 148 (L) 11/09/2016   CMP     Component Value Date/Time   NA 136 11/09/2016 0859   K 3.5 11/09/2016 0859   CL 101 11/09/2016 0859   CO2 27 11/09/2016 0859   GLUCOSE 116 (H) 11/09/2016 0859   BUN 12 11/09/2016 0859   CREATININE 0.69 11/09/2016 0859   CALCIUM 9.0 11/09/2016 0859   PROT 6.6 11/09/2016 0859   ALBUMIN 4.2 11/09/2016 0859   AST 22 11/09/2016 0859   ALT 15 11/09/2016 0859   ALKPHOS 102 11/09/2016 0859   BILITOT 0.4 11/09/2016 0859   GFRNONAA >60 11/09/2016 0859   GFRAA >60 11/09/2016 0859     RADIOGRAPHIC STUDIES: I have personally reviewed the radiological images as listed and agreed with the findings in the report. No results found.  Study Result   CLINICAL DATA:  Restaging small cell lung cancer  EXAM: CT CHEST WITH CONTRAST  TECHNIQUE: Multidetector CT imaging of the chest was performed during intravenous contrast administration.  CONTRAST:  33m ISOVUE-300 IOPAMIDOL (ISOVUE-300) INJECTION 61%  COMPARISON:  09/23/2016  FINDINGS: Cardiovascular: Heart size upper normal to mildly increased. No pericardial effusion. Coronary artery calcification is noted. Atherosclerotic calcification is noted in the wall of the thoracic aorta. Left Port-A-Cath tip position at the mid to distal SVC level.  Mediastinum/Nodes: High right paratracheal lymph node measured previously at 9 mm short axis is now 6 mm. 16 mm short axis right paratracheal lymph node measured previously is now 11 mm. 16 mm short axis subcarinal lymph node measured on the prior study is now 11 mm short axis. The 11 mm short axis right hilar lymph node measured previously The esophagus has normal imaging features. There is no axillary lymphadenopathy. now measures  9 mm.  Lungs/Pleura: Centrilobular emphysema noted bilaterally. Chronic bronchial wall thickening is evident. Compressive subsegmental atelectasis noted dependently in the lungs bilaterally. No focal airspace consolidation. No pulmonary edema or pleural effusion. No suspicious pulmonary nodule or mass. There is some minimal volume loss and airway distortion (scarring) in the posterior right lower lobe (Image 84 series 3) at the site of the lesion seen previously (07/12/2016) but this distortion is even less prominent than it was the most recent comparison diagnostic CT of on 09/23/2016.  Upper Abdomen: Unremarkable.  Musculoskeletal: Bone windows reveal no worrisome lytic or sclerotic osseous lesions.  IMPRESSION: 1. No new or progressive findings in the chest. 2. Further interval decrease in size of mediastinal lymphadenopathy. 3. Only trace scarring visible in the right lower lobe, at the location of the previous right lower lobe mass. 4. Coronary artery and thoracoabdominal aortic atherosclerosis.   Electronically Signed   By: EMisty StanleyM.D.   On: 11/23/2016 15:02     PATHOLOGY     ASSESSMENT & PLAN:  Small cell lung cancer, limited stage Cough Hyponatremia Tobacco Abuse SIADH Headaches Hypomagnesemia   CT imaging was reviewed with the patient results are noted above. We discussed the fact that she did not do WBXRT. She understands the associated risks. She was felt not to be a good candidate for XRT to the chest.   We discussed smoking cessation again..   Labs reviewed. Results noted above.  I refilled her Ambien and nausea medication.  She was told to let the clinic know if she develops any headaches, blurry vision or neurologic symptoms. She notes that she is aware.  RTC in 2 months with repeat scans.  Orders Placed This Encounter  Procedures  . CT Chest W Contrast    Standing Status:   Future    Standing Expiration Date:   12/09/2017     Order Specific Question:   If indicated for the ordered procedure, I authorize the administration of contrast media per Radiology protocol    Answer:   Yes    Order Specific Question:   Reason for Exam (SYMPTOM  OR DIAGNOSIS REQUIRED)    Answer:   restaging SCLC    Order Specific Question:   Is patient pregnant?    Answer:   No    Order Specific Question:   Preferred imaging location?    Answer:   [facility]  . CT Abdomen Pelvis W Contrast    Standing Status:   Future    Standing Expiration Date:   12/09/2017    Order Specific Question:   If indicated for the ordered procedure, I authorize the administration of contrast media per Radiology protocol    Answer:   Yes    Order Specific Question:   Reason for Exam (SYMPTOM  OR DIAGNOSIS REQUIRED)    Answer:   restaging SCLC    Order Specific Question:   Is patient pregnant?    Answer:   No    Order Specific Question:   Preferred imaging location?    Answer:   [facility]  . CBC with Differential    Standing Status:   Future    Standing Expiration Date:   04/08/2017  . Comprehensive metabolic panel    Standing Status:   Future    Standing Expiration Date:   04/08/2017   Meds ordered this encounter  Medications  . zolpidem (AMBIEN) 10 MG tablet    Sig: Take 1 tablet (10 mg total) by mouth at bedtime as needed for sleep.    Dispense:  30 tablet    Refill:  3  . ondansetron (ZOFRAN) 8 MG tablet    Sig: Take 1 tablet (8 mg total) by mouth 2 (two) times daily as needed. Start on the third day after cisplatin chemotherapy.    Dispense:  60 tablet    Refill:  2     All questions were answered. The patient knows to call the clinic with any problems, questions or concerns.  This document serves as a record of services personally performed by Ancil Linsey, MD. It was created on her behalf by Shirlean Mylar, a trained medical scribe. The creation of this record is based on the scribe's personal observations and the  provider's statements to them. This document has been checked and approved by the attending provider.   I have reviewed the above documentation for accuracy and completeness and I agree with the above.  This note was electronically signed.    Molli Hazard, MD  12/21/2016 4:23 PM

## 2016-12-09 NOTE — Patient Instructions (Signed)
Lydia Moore at [facility] Discharge Instructions  RECOMMENDATIONS MADE BY THE CONSULTANT AND ANY TEST RESULTS WILL BE SENT TO YOUR REFERRING PHYSICIAN.  You were seen today by Dr. Whitney Muse CT scan in April Follow up in clinic after scans with lab work  We refilled your ambien and nausea medication today  Thank you for choosing [place] at [facility] to provide your oncology and hematology care.  To afford each patient quality time with our provider, please arrive at least 15 minutes before your scheduled appointment time.    If you have a lab appointment with the [facility] please come in thru the  Main Entrance and check in at the main information desk  You need to re-schedule your appointment should you arrive 10 or more minutes late.  We strive to give you quality time with our providers, and arriving late affects you and other patients whose appointments are after yours.  Also, if you no show three or more times for appointments you may be dismissed from the clinic at the providers discretion.     Again, thank you for choosing [facility].  Our hope is that these requests will decrease the amount of time that you wait before being seen by our physicians.       _____________________________________________________________  Should you have questions after your visit to [facility], please contact our office at [phone] between the hours of 8:30 a.m. and 4:30 p.m.  Voicemails left after 4:30 p.m. will not be returned until the following business day.  For prescription refill requests, have your pharmacy contact our office.       Resources For Cancer Patients and their Caregivers ? American Cancer Society: Can assist with transportation, wigs, general needs, runs Look Good Feel Better.        604-491-0366 ? Cancer Care: Provides financial assistance, online support groups, medication/co-pay assistance.   1-800-813-HOPE 717-154-9846) ? [place] Assists [place] cancer patients and their families through emotional , educational and financial support.  5753658587 ? [place] DSS Where to apply for food stamps, Medicaid and utility assistance. 832-363-1506 ? [employer]: Transportation to medical appointments. 412-117-1038 ? Social Security Administration: May apply for disability if have a Stage IV cancer. 831-071-8945 639-476-7654 ? [employer], Disability and Transit Services: Assists with nutrition, care and transit needs. [place] Support Programs: '@10RELATIVEDAYS'$ @ > Cancer Support Group  2nd Tuesday of the month 1pm-2pm, Journey Room  > Creative Journey  3rd Tuesday of the month 1130am-1pm, Journey Room  > Look Good Feel Better  1st Wednesday of the month 10am-12 noon, Journey Room (Call [place] to register [phone])

## 2016-12-21 ENCOUNTER — Encounter (HOSPITAL_COMMUNITY): Payer: Self-pay | Admitting: Hematology & Oncology

## 2017-01-06 ENCOUNTER — Encounter (HOSPITAL_COMMUNITY): Payer: Managed Care, Other (non HMO) | Attending: Hematology & Oncology

## 2017-01-06 ENCOUNTER — Encounter (HOSPITAL_COMMUNITY): Payer: Self-pay

## 2017-01-06 DIAGNOSIS — Z452 Encounter for adjustment and management of vascular access device: Secondary | ICD-10-CM

## 2017-01-06 DIAGNOSIS — C3431 Malignant neoplasm of lower lobe, right bronchus or lung: Secondary | ICD-10-CM | POA: Diagnosis not present

## 2017-01-06 DIAGNOSIS — C801 Malignant (primary) neoplasm, unspecified: Secondary | ICD-10-CM | POA: Insufficient documentation

## 2017-01-06 MED ORDER — HEPARIN SOD (PORK) LOCK FLUSH 100 UNIT/ML IV SOLN
500.0000 [IU] | Freq: Once | INTRAVENOUS | Status: AC
  Administered 2017-01-06: 500 [IU] via INTRAVENOUS
  Filled 2017-01-06: qty 5

## 2017-01-06 MED ORDER — SODIUM CHLORIDE 0.9% FLUSH
10.0000 mL | INTRAVENOUS | Status: DC | PRN
  Administered 2017-01-06: 10 mL via INTRAVENOUS
  Filled 2017-01-06: qty 10

## 2017-01-06 NOTE — Progress Notes (Signed)
Lydia Moore presented for Portacath access and flush. Portacath located left  chest wall accessed with  H 20 needle. Good blood return present. Portacath flushed with 21m NS and 500U/519mHeparin and needle removed intact. Procedure without incident. Patient tolerated procedure well. Vitals stable and discharged home from clinic ambulatory. Follow up as scheduled.

## 2017-01-06 NOTE — Patient Instructions (Signed)
[facility] at [facility] Discharge Instructions  RECOMMENDATIONS MADE BY THE CONSULTANT AND ANY TEST RESULTS WILL BE SENT TO YOUR REFERRING PHYSICIAN.  Port flush done today. Follow up as scheduled.  Thank you for choosing [facility] at [facility] to provide your oncology and hematology care.  To afford each patient quality time with our provider, please arrive at least 15 minutes before your scheduled appointment time.    If you have a lab appointment with the Cancer Center please come in thru the  Main Entrance and check in at the main information desk  You need to re-schedule your appointment should you arrive 10 or more minutes late.  We strive to give you quality time with our providers, and arriving late affects you and other patients whose appointments are after yours.  Also, if you no show three or more times for appointments you may be dismissed from the clinic at the providers discretion.     Again, thank you for choosing [facility].  Our hope is that these requests will decrease the amount of time that you wait before being seen by our physicians.       _____________________________________________________________  Should you have questions after your visit to [facility], please contact our office at [phone] between the hours of 8:30 a.m. and 4:30 p.m.  Voicemails left after 4:30 p.m. will not be returned until the following business day.  For prescription refill requests, have your pharmacy contact our office.       Resources For Cancer Patients and their Caregivers ? American Cancer Society: Can assist with transportation, wigs, general needs, runs Look Good Feel Better.        1-888-227-6333 ? Cancer Care: Provides financial assistance, online support groups, medication/co-pay assistance.  1-800-813-HOPE (4673) ? [facility] Assists [place] cancer patients and  their families through emotional , educational and financial support.  336-427-4357 ? [place] DSS Where to apply for food stamps, Medicaid and utility assistance. 336-342-1394 ? [employer]: Transportation to medical appointments. 336-347-2287 ? Social Security Administration: May apply for disability if have a Stage IV cancer. 336-342-7796 1-800-772-1213 ? [place] [employer], Disability and Transit Services: Assists with nutrition, care and transit needs. 336-349-2343  Cancer Center Support Programs: @10RELATIVEDAYS@ > Cancer Support Group  2nd Tuesday of the month 1pm-2pm, Journey Room  > Creative Journey  3rd Tuesday of the month 1130am-1pm, Journey Room  > Look Good Feel Better  1st Wednesday of the month 10am-12 noon, Journey Room (Call American Cancer [employer] to register 1-800-395-5775)   

## 2017-01-11 ENCOUNTER — Telehealth (HOSPITAL_COMMUNITY): Payer: Self-pay

## 2017-01-11 ENCOUNTER — Other Ambulatory Visit (HOSPITAL_COMMUNITY): Payer: Self-pay

## 2017-01-11 DIAGNOSIS — G629 Polyneuropathy, unspecified: Secondary | ICD-10-CM

## 2017-01-11 MED ORDER — DULOXETINE HCL 30 MG PO CPEP: ORAL_CAPSULE | ORAL | 0 refills | Status: DC

## 2017-01-11 NOTE — Telephone Encounter (Signed)
-----   Message from Baird Cancer, PA-C sent at 01/11/2017  3:31 PM EST ----- Ok.   1. Start Cymbalta 30 mg x 3 days and then 60 mg daily thereafter.  Escribe 30 mg tabs #60 with 0 refills.  When we refill it, we will give 60 mg capsules next month. 2. Have her come in for labs: CBC diff, B12, folate, homocysteine, and methylmalonic acid (blood or serum). 3. Refer to Dr. Merlene Laughter (neurology) for nerve conduction study.  TK  ----- Message ----- From: Darlina Guys, LPN Sent: 9/70/2637   2:09 PM To: Baird Cancer, PA-C  Patient called stating she has had tingling and numbness in her feet and fingertips x 2 weeks. Today the whole bottom of her foot started burning also. She states she is not on anything for neuropathy. She says she told one of the nurses last time she was here but I cannot find it in the notes. Please let me know what to start her on.  Thanks, MB

## 2017-01-11 NOTE — Telephone Encounter (Signed)
Notified patient of new medicine to start. Also made lab appointment and linked ordered labs. Message sent to Amy to schedule appt with Dr. Merlene Laughter.

## 2017-01-12 ENCOUNTER — Encounter (HOSPITAL_COMMUNITY): Payer: Self-pay | Admitting: Lab

## 2017-01-12 NOTE — Progress Notes (Unsigned)
Referral to [facility] for nerve conduction study.  Records faxed on 2/28.

## 2017-01-13 ENCOUNTER — Encounter (HOSPITAL_COMMUNITY): Payer: Managed Care, Other (non HMO) | Attending: Hematology & Oncology

## 2017-01-13 DIAGNOSIS — G629 Polyneuropathy, unspecified: Secondary | ICD-10-CM

## 2017-01-13 DIAGNOSIS — C801 Malignant (primary) neoplasm, unspecified: Secondary | ICD-10-CM | POA: Diagnosis not present

## 2017-01-13 LAB — CBC WITH DIFFERENTIAL/PLATELET
BASOS ABS: 0 10*3/uL (ref 0.0–0.1)
Basophils Relative: 0 %
Eosinophils Absolute: 0.1 10*3/uL (ref 0.0–0.7)
Eosinophils Relative: 3 %
HEMATOCRIT: 37.5 % (ref 36.0–46.0)
HEMOGLOBIN: 12.8 g/dL (ref 12.0–15.0)
LYMPHS ABS: 0.9 10*3/uL (ref 0.7–4.0)
LYMPHS PCT: 21 %
MCH: 33 pg (ref 26.0–34.0)
MCHC: 34.1 g/dL (ref 30.0–36.0)
MCV: 96.6 fL (ref 78.0–100.0)
Monocytes Absolute: 0.2 10*3/uL (ref 0.1–1.0)
Monocytes Relative: 5 %
NEUTROS ABS: 3 10*3/uL (ref 1.7–7.7)
Neutrophils Relative %: 71 %
PLATELETS: 124 10*3/uL — AB (ref 150–400)
RBC: 3.88 MIL/uL (ref 3.87–5.11)
RDW: 12.8 % (ref 11.5–15.5)
WBC: 4.3 10*3/uL (ref 4.0–10.5)

## 2017-01-13 LAB — VITAMIN B12: Vitamin B-12: 523 pg/mL (ref 180–914)

## 2017-01-13 LAB — FOLATE: Folate: 21.1 ng/mL (ref >5.9)

## 2017-01-14 LAB — HOMOCYSTEINE: HOMOCYSTEINE-NORM: 9.8 umol/L (ref 0.0–15.0)

## 2017-01-17 LAB — METHYLMALONIC ACID, SERUM: METHYLMALONIC ACID, QUANTITATIVE: 201 nmol/L (ref 0–378)

## 2017-01-31 ENCOUNTER — Telehealth (HOSPITAL_COMMUNITY): Payer: Self-pay

## 2017-01-31 DIAGNOSIS — T451X5A Adverse effect of antineoplastic and immunosuppressive drugs, initial encounter: Principal | ICD-10-CM

## 2017-01-31 DIAGNOSIS — G620 Drug-induced polyneuropathy: Secondary | ICD-10-CM

## 2017-01-31 MED ORDER — GABAPENTIN 100 MG PO CAPS: ORAL_CAPSULE | ORAL | 0 refills | Status: DC

## 2017-01-31 NOTE — Telephone Encounter (Signed)
Patient called stating her numbness and itching in her feet and fingertips was getting worse. Reviewed with NP. Prescription for gabapentin sent to patients pharmacy. Also instructed to continue the Cymbalta. Patient verbalized understanding.

## 2017-02-07 ENCOUNTER — Other Ambulatory Visit (HOSPITAL_COMMUNITY): Payer: Self-pay | Admitting: Oncology

## 2017-02-07 DIAGNOSIS — G629 Polyneuropathy, unspecified: Secondary | ICD-10-CM

## 2017-02-15 ENCOUNTER — Other Ambulatory Visit (HOSPITAL_COMMUNITY): Payer: Self-pay | Admitting: Adult Health

## 2017-02-15 DIAGNOSIS — G620 Drug-induced polyneuropathy: Secondary | ICD-10-CM

## 2017-02-15 DIAGNOSIS — T451X5A Adverse effect of antineoplastic and immunosuppressive drugs, initial encounter: Principal | ICD-10-CM

## 2017-02-16 ENCOUNTER — Other Ambulatory Visit (HOSPITAL_COMMUNITY): Payer: Self-pay | Admitting: Emergency Medicine

## 2017-02-16 MED ORDER — GABAPENTIN 300 MG PO CAPS: 300.0000 mg | ORAL_CAPSULE | Freq: Three times a day (TID) | ORAL | 1 refills | Status: DC

## 2017-02-16 NOTE — Progress Notes (Signed)
Refilled pts gabapentin but increased strength to 300 mg TID, sent to [facility] in [place]

## 2017-02-17 ENCOUNTER — Encounter (HOSPITAL_COMMUNITY): Payer: Self-pay

## 2017-02-17 ENCOUNTER — Encounter (HOSPITAL_COMMUNITY): Payer: Managed Care, Other (non HMO) | Attending: Hematology & Oncology

## 2017-02-17 ENCOUNTER — Other Ambulatory Visit (HOSPITAL_COMMUNITY): Payer: Managed Care, Other (non HMO)

## 2017-02-17 DIAGNOSIS — C3431 Malignant neoplasm of lower lobe, right bronchus or lung: Secondary | ICD-10-CM

## 2017-02-17 DIAGNOSIS — G629 Polyneuropathy, unspecified: Secondary | ICD-10-CM

## 2017-02-17 DIAGNOSIS — C801 Malignant (primary) neoplasm, unspecified: Secondary | ICD-10-CM | POA: Insufficient documentation

## 2017-02-17 DIAGNOSIS — G620 Drug-induced polyneuropathy: Secondary | ICD-10-CM

## 2017-02-17 DIAGNOSIS — T451X5A Adverse effect of antineoplastic and immunosuppressive drugs, initial encounter: Secondary | ICD-10-CM

## 2017-02-17 LAB — COMPREHENSIVE METABOLIC PANEL
ALT: 27 U/L (ref 14–54)
ANION GAP: 11 (ref 5–15)
AST: 32 U/L (ref 15–41)
Albumin: 4.2 g/dL (ref 3.5–5.0)
Alkaline Phosphatase: 77 U/L (ref 38–126)
BILIRUBIN TOTAL: 0.3 mg/dL (ref 0.3–1.2)
BUN: 26 mg/dL — ABNORMAL HIGH (ref 6–20)
CO2: 24 mmol/L (ref 22–32)
Calcium: 9.2 mg/dL (ref 8.9–10.3)
Chloride: 99 mmol/L — ABNORMAL LOW (ref 101–111)
Creatinine, Ser: 0.98 mg/dL (ref 0.44–1.00)
GFR calc Af Amer: >60 mL/min (ref >60)
Glucose, Bld: 93 mg/dL (ref 65–99)
POTASSIUM: 3.4 mmol/L — AB (ref 3.5–5.1)
Sodium: 134 mmol/L — ABNORMAL LOW (ref 135–145)
TOTAL PROTEIN: 6.9 g/dL (ref 6.5–8.1)

## 2017-02-17 LAB — CBC WITH DIFFERENTIAL/PLATELET
BASOS PCT: 0 %
Basophils Absolute: 0 10*3/uL (ref 0.0–0.1)
EOS ABS: 0.1 10*3/uL (ref 0.0–0.7)
EOS PCT: 2 %
HCT: 36.3 % (ref 36.0–46.0)
Hemoglobin: 12.4 g/dL (ref 12.0–15.0)
LYMPHS ABS: 1 10*3/uL (ref 0.7–4.0)
Lymphocytes Relative: 21 %
MCH: 31.6 pg (ref 26.0–34.0)
MCHC: 34.2 g/dL (ref 30.0–36.0)
MCV: 92.4 fL (ref 78.0–100.0)
MONO ABS: 0.3 10*3/uL (ref 0.1–1.0)
MONOS PCT: 6 %
NEUTROS PCT: 71 %
Neutro Abs: 3.4 10*3/uL (ref 1.7–7.7)
Platelets: 149 10*3/uL — ABNORMAL LOW (ref 150–400)
RBC: 3.93 MIL/uL (ref 3.87–5.11)
RDW: 12.8 % (ref 11.5–15.5)
WBC: 4.8 10*3/uL (ref 4.0–10.5)

## 2017-02-17 LAB — VITAMIN B12: VITAMIN B 12: 535 pg/mL (ref 180–914)

## 2017-02-17 MED ORDER — SODIUM CHLORIDE 0.9% FLUSH
10.0000 mL | INTRAVENOUS | Status: DC | PRN
  Administered 2017-02-17: 10 mL via INTRAVENOUS
  Filled 2017-02-17: qty 10

## 2017-02-17 MED ORDER — HEPARIN SOD (PORK) LOCK FLUSH 100 UNIT/ML IV SOLN
500.0000 [IU] | Freq: Once | INTRAVENOUS | Status: AC
  Administered 2017-02-17: 500 [IU] via INTRAVENOUS
  Filled 2017-02-17: qty 5

## 2017-02-17 MED ORDER — GABAPENTIN 100 MG PO CAPS: 300.0000 mg | ORAL_CAPSULE | Freq: Four times a day (QID) | ORAL | 0 refills | Status: DC

## 2017-02-17 MED ORDER — DULOXETINE HCL 30 MG PO CPEP: 60.0000 mg | ORAL_CAPSULE | Freq: Every day | ORAL | 3 refills | Status: DC

## 2017-02-17 NOTE — Progress Notes (Signed)
Clovia Cuff presented for Portacath access and flush.  Portacath located left chest wall accessed with  H 20 needle.  No blood return but flushed without resistance. Portacath flushed with 40m NS and 500U/552mHeparin and needle removed intact.  Procedure tolerated well and without incident.

## 2017-02-21 ENCOUNTER — Ambulatory Visit (HOSPITAL_COMMUNITY)
Admission: RE | Admit: 2017-02-21 | Discharge: 2017-02-21 | Disposition: A | Discharge status: Home / Self Care | Payer: Managed Care, Other (non HMO) | Source: Ambulatory Visit | Attending: Hematology & Oncology | Admitting: Hematology & Oncology

## 2017-02-21 DIAGNOSIS — I714 Abdominal aortic aneurysm, without rupture: Secondary | ICD-10-CM | POA: Insufficient documentation

## 2017-02-21 DIAGNOSIS — I2510 Atherosclerotic heart disease of native coronary artery without angina pectoris: Secondary | ICD-10-CM | POA: Insufficient documentation

## 2017-02-21 DIAGNOSIS — I700 Atherosclerosis of aorta: Secondary | ICD-10-CM | POA: Insufficient documentation

## 2017-02-21 DIAGNOSIS — C801 Malignant (primary) neoplasm, unspecified: Secondary | ICD-10-CM | POA: Diagnosis present

## 2017-02-21 DIAGNOSIS — M479 Spondylosis, unspecified: Secondary | ICD-10-CM | POA: Insufficient documentation

## 2017-02-21 DIAGNOSIS — M5136 Other intervertebral disc degeneration, lumbar region: Secondary | ICD-10-CM | POA: Insufficient documentation

## 2017-02-21 MED ORDER — IOPAMIDOL (ISOVUE-300) INJECTION 61%
100.0000 mL | Freq: Once | INTRAVENOUS | Status: AC | PRN
  Administered 2017-02-21: 100 mL via INTRAVENOUS

## 2017-02-22 ENCOUNTER — Encounter (HOSPITAL_BASED_OUTPATIENT_CLINIC_OR_DEPARTMENT_OTHER): Payer: Managed Care, Other (non HMO) | Admitting: Oncology

## 2017-02-22 ENCOUNTER — Encounter (HOSPITAL_COMMUNITY): Payer: Self-pay | Admitting: Oncology

## 2017-02-22 VITALS — BP 134/87 | HR 89 | Temp 97.7°F | Resp 16 | Ht 65.0 in | Wt 140.0 lb

## 2017-02-22 DIAGNOSIS — C3491 Malignant neoplasm of unspecified part of right bronchus or lung: Secondary | ICD-10-CM

## 2017-02-22 DIAGNOSIS — Z720 Tobacco use: Secondary | ICD-10-CM

## 2017-02-22 DIAGNOSIS — C801 Malignant (primary) neoplasm, unspecified: Secondary | ICD-10-CM

## 2017-02-22 DIAGNOSIS — G620 Drug-induced polyneuropathy: Secondary | ICD-10-CM

## 2017-02-22 DIAGNOSIS — T451X5A Adverse effect of antineoplastic and immunosuppressive drugs, initial encounter: Secondary | ICD-10-CM

## 2017-02-22 DIAGNOSIS — M8588 Other specified disorders of bone density and structure, other site: Secondary | ICD-10-CM

## 2017-02-22 HISTORY — DX: Drug-induced polyneuropathy: G62.0

## 2017-02-22 HISTORY — DX: Adverse effect of antineoplastic and immunosuppressive drugs, initial encounter: T45.1X5A

## 2017-02-22 MED ORDER — DULOXETINE HCL 60 MG PO CPEP: 60.0000 mg | ORAL_CAPSULE | Freq: Every day | ORAL | 3 refills | Status: DC

## 2017-02-22 MED ORDER — GABAPENTIN 300 MG PO CAPS: 300.0000 mg | ORAL_CAPSULE | Freq: Four times a day (QID) | ORAL | 3 refills | Status: DC

## 2017-02-22 NOTE — Progress Notes (Signed)
[place] [id number]  Small cell carcinoma [facility]) - Plan: CBC with Differential, Comprehensive metabolic panel, Magnesium, CBC with Differential, Comprehensive metabolic panel, MR Brain W Wo Contrast, NM Bone Scan Whole Body  Peripheral neuropathy due to chemotherapy (HCC) - Plan: DULoxetine (CYMBALTA) 60 MG capsule, gabapentin (NEURONTIN) 300 MG capsule  CURRENT THERAPY: Surveillance  INTERVAL HISTORY: Lydia Moore 56 y.o. 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   S/P 6 cycles of cisplatin/etoposide (07/27/2016- 11/11/2016) with curative intent.  She was evaluated for XRT for involved field radiation following cycle #4, but due to tumor burden, she was deemed not an XRT candidate.  She refused whole brain radiation in the prophylactic setting following systemic chemotherapy.     Small cell carcinoma ([place])   07/12/2016 Imaging    CTA chest 3.9 x 3.5 cm right lower lobe mass with imaging features most compatible with a primary lung carcinoma.Marked metastatic right hilar and mediastinal adenopathy including a confluent mass of adenopathy in the right hilum, encasing and causing marked narrowing of the central pulmonary arteries on the right with a short segment of occlusion of the right lower lobe bronchus.Mild postobstructive changes in the inferior, medial right lower lobe.Mild changes of COPD       07/16/2016 Procedure    Endoscopic bronchoscopy with transbronchial biopsy of #7 nodes and biopsy of RLL lung mass      07/16/2016 Pathology Results    Lung, biopsy, Right Lower Lobe - SMALL CELL CARCINOMA.       07/21/2016 Imaging    MRI brain Negative MRI of the brain. No evidence for metastatic disease to the brain or meninges.      07/26/2016 PET scan    Markedly hypermetabolic right lower lobe lesion with hypermetabolic metastatic disease in the right hilum  and mediastinum. 2. Several scattered hypermetabolic ground-glass opacities in the right upper lobe. FDG accumulation within these nodules is higher than typically seen for infectious/inflammatory etiology although this remains within the differential. Atypical appearance of metastatic spread would also be a consideration. 3. Coronary artery atherosclerosis. 4. **An incidental finding of potential clinical significance has been found. 3.8 cm abdominal aortic aneurysm       07/27/2016 - 11/11/2016 Chemotherapy    Cisplatin/Etoposide x 6 cycles      11/09/2016 Treatment Plan Change    Patient declined/refused whole brain radiation.      11/23/2016 Imaging    CT chest with No new or progressive findings in the chest. 2. Further interval decrease in size of mediastinal lymphadenopathy. 3. Only trace scarring visible in the right lower lobe, at the location of the previous right lower lobe mass. 4. Coronary artery and thoracoabdominal aortic atherosclerosis      02/21/2017 Imaging    CT CAP- 1. Unfortunately the patient has had significant recurrence with marked mediastinal and right infrahilar adenopathy as well as suspected masses in the collapsed right lower lobe. The confluent adenopathy completely occlude the right lower lobe and right middle lobe bronchi, although there is some air trapping in the right middle lobe ; the right lower lobe is completely collapsed around several right lower lobe masses. 2. There is some speckled density in the left anterior sixth rib which is not entirely specific but which is concerning for early osseous metastatic disease given that it was not present on 11/23/2016. Consider a bone scan to  further assess the skeleton for other occult bony spread. 3. Infrarenal abdominal aortic aneurysm 4.2 cm in diameter, increased from the prior measurement of 3.8 cm on 07/26/2016. Although this is only minor increase, it has occurred in a 7 month period. The  patient has a previous existing relationship of cardiothoracic surgery and surveillance of this aneurysm by surgery is appropriate. 4. Other imaging findings of potential clinical significance: Coronary, aortic arch, and branch vessel atherosclerotic vascular disease. Mild lumbar spondylosis and degenerative disc disease.      02/22/2017 Relapse/Recurrence    Last Treatment Date: 11/11/2016 Recent Lab Values: Recent Lab Values: N/A      She is here to review most recent imaging, restaging scans.  She notes that she has been doing well.  Her appetite is strong.  Weight is stable.  She denies any changes in her breathing status.  She denies any chest pains or shortness of breath.  She reports that she feels well.  "I feel wonderful."  She looks great clinically.  Unfortunately, her scans demonstrate significant relapse/recurrence of disease.  We spent a lot of time discussing this issue and reviewing second line treatment options.  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  She notes that it is not significantly improved since her last encounter.  She is on Cymbalta therapy.  Review of Systems  Constitutional: Negative.  Negative for chills, fever and weight loss.  HENT: Negative.   Eyes: Negative.   Respiratory: Negative.  Negative for cough.   Cardiovascular: Negative.  Negative for chest pain.  Gastrointestinal: Negative.  Negative for blood in stool, constipation, diarrhea, melena, nausea and vomiting.  Genitourinary: Negative.   Musculoskeletal: Negative.   Skin: Negative.   Neurological: Positive for sensory change (Peripheral neuropathy.). Negative for weakness.  Endo/Heme/Allergies: Negative.   Psychiatric/Behavioral: Negative.     Past Medical History:  Diagnosis Date  . Elevated cholesterol   . GERD (gastroesophageal reflux disease)   . GI bleed   . Hypertension   . Peripheral neuropathy due to chemotherapy ([place]) 02/22/2017  . Small cell  carcinoma ([place]) 07/21/2016    Past Surgical History:  Procedure Laterality Date  . ABDOMINAL HYSTERECTOMY     partial  . CESAREAN SECTION    . COLONOSCOPY    . PORTACATH PLACEMENT Left 08/04/2016   Procedure: INSERTION PORT-A-CATH LEFT SUBCLAVIAN;  Surgeon: Aviva Signs, MD;  Location: [facility];  Service: General;  Laterality: Left;  . TONSILLECTOMY    . VIDEO BRONCHOSCOPY WITH ENDOBRONCHIAL ULTRASOUND N/A 07/16/2016   Procedure: VIDEO BRONCHOSCOPY WITH ENDOBRONCHIAL ULTRASOUND with ultrasound transbronchial biopsy of node #7 and right lower lobe lesion;  Surgeon: Grace Isaac, MD;  Location: [facility];  Service: Thoracic;  Laterality: N/A;    Family History  Problem Relation Age of Onset  . Heart disease Mother   . Heart disease Father   . COPD Sister   . HIV Brother     Social History   Social History  . Marital status: Married    Spouse name: N/A  . Number of children: 4  . Years of education: N/A   Occupational History  . [employer]   Social History Main Topics  . Smoking status: Current Every Day Smoker    Packs/day: 0.50    Years: 35.00    Types: Cigarettes  . Smokeless tobacco: Never Used  . Alcohol use Yes     Comment: occasionally, Couple drinks per mo  . Drug use: Yes    Frequency:  1.0 time per week    Types: Marijuana     Comment: weekly  . Sexual activity: Yes    Birth control/ protection: Surgical     Comment: married   Other Topics Concern  . None   Social History Narrative   Lives w/ husband     PHYSICAL EXAMINATION  ECOG PERFORMANCE STATUS: 1 - Symptomatic but completely ambulatory  Vitals:   02/22/17 1120  BP: 134/87  Pulse: 89  Resp: 16  Temp: 97.7 F (36.5 C)    GENERAL:alert, no distress, well nourished, well developed, comfortable, cooperative, smiling and unaccompanied, looking very good. SKIN: skin color, texture, turgor are normal, no rashes or significant lesions HEAD: Normocephalic, No masses, lesions, tenderness  or abnormalities EYES: normal, EOMI, Conjunctiva are pink and non-injected EARS: External ears normal OROPHARYNX:lips, buccal mucosa, and tongue normal and mucous membranes are moist  NECK: supple, no adenopathy, trachea midline LYMPH:  no palpable lymphadenopathy BREAST:not examined LUNGS: positive findings: No breath sounds in the right middle lobe and right base.  Otherwise clear to auscultation. HEART: regular rate & rhythm, no murmurs, no gallops, S1 normal and S2 normal ABDOMEN:abdomen soft, non-tender and normal bowel sounds BACK: Back symmetric, no curvature. EXTREMITIES:less then 2 second capillary refill, no joint deformities, effusion, or inflammation, no skin discoloration, no cyanosis  NEURO: alert & oriented x 3 with fluent speech, no focal motor/sensory deficits, gait normal   LABORATORY DATA: CBC    Component Value Date/Time   WBC 4.8 02/17/2017 1601   RBC 3.93 02/17/2017 1601   HGB 12.4 02/17/2017 1601   HCT 36.3 02/17/2017 1601   PLT 149 (L) 02/17/2017 1601   MCV 92.4 02/17/2017 1601   MCH 31.6 02/17/2017 1601   MCHC 34.2 02/17/2017 1601   RDW 12.8 02/17/2017 1601   LYMPHSABS 1.0 02/17/2017 1601   MONOABS 0.3 02/17/2017 1601   EOSABS 0.1 02/17/2017 1601   BASOSABS 0.0 02/17/2017 1601      Chemistry      Component Value Date/Time   NA 134 (L) 02/17/2017 1601   K 3.4 (L) 02/17/2017 1601   CL 99 (L) 02/17/2017 1601   CO2 24 02/17/2017 1601   BUN 26 (H) 02/17/2017 1601   CREATININE 0.98 02/17/2017 1601      Component Value Date/Time   CALCIUM 9.2 02/17/2017 1601   ALKPHOS 77 02/17/2017 1601   AST 32 02/17/2017 1601   ALT 27 02/17/2017 1601   BILITOT 0.3 02/17/2017 1601        PENDING LABS:   RADIOGRAPHIC STUDIES:  Ct Chest W Contrast  Result Date: 02/21/2017 CLINICAL DATA:  Small cell lung cancer restaging. EXAM: CT CHEST, ABDOMEN, AND PELVIS WITH CONTRAST TECHNIQUE: Multidetector CT imaging of the chest, abdomen and pelvis was performed  following the standard protocol during bolus administration of intravenous contrast. CONTRAST:  132m ISOVUE-300 IOPAMIDOL (ISOVUE-300) INJECTION 61% COMPARISON:  Multiple exams, including 11/23/2016 chest CT and prior PET-CT from 07/26/2016 FINDINGS: CT CHEST FINDINGS Cardiovascular: Coronary, aortic arch, and branch vessel atherosclerotic vascular disease. The extensive new mediastinal tumor is causing extrinsic compression of the right lower lobe pulmonary artery, which is likely also partially narrowed due to vaso constriction related to hypoaeration of the right lower lobe. Mediastinum/Nodes: Confluent right upper paratracheal adenopathy 3.6 cm in short axis on image 22/2, previously 0.6 cm. Masslike confluent subcarinal adenopathy along with right infrahilar adenopathy/tumor encases and occludes the right middle lobe and right lower lobe bronchi, with resulting complete atelectasis of the right lower  lobe and some air trapping in the right middle lobe. The subcarinal lymph node measures about 4.5 cm in short axis on image 31/2 but is difficult separate from the surrounding infrahilar adenopathy which is likewise prominent. Lungs/Pleura: There are masslike densities within the collapsed right lower lobe, for example 1 measuring 3.1 by 3.9 cm on image 39 of series 2. Musculoskeletal: There is some speckled heterogeneity in the left anterior sixth rib which seems new compared to the exam of 11/23/2016. Lower cervical spondylosis. Mild dextroconvex thoracic scoliosis. CT ABDOMEN PELVIS FINDINGS Hepatobiliary: Unremarkable Pancreas: Unremarkable Spleen: Unremarkable Adrenals/Urinary Tract: Slight nodularity of the left adrenal gland was not previously hypermetabolic and is probably benign/ incidental. Kidneys unremarkable. Stomach/Bowel: Unremarkable Vascular/Lymphatic: Aortoiliac atherosclerotic vascular disease. Fusiform infrarenal abdominal aortic aneurysm 4.2 cm in diameter on image 72/2, with associated mural  thrombus. This extends down to the bifurcation. Reproductive: Uterus absent.  Adnexa unremarkable. Other: No supplemental non-categorized findings. Musculoskeletal: Mild lumbar spondylosis and degenerative disc disease. IMPRESSION: 1. Unfortunately the patient has had significant recurrence with marked mediastinal and right infrahilar adenopathy as well as suspected masses in the collapsed right lower lobe. The confluent adenopathy completely occlude the right lower lobe and right middle lobe bronchi, although there is some air trapping in the right middle lobe ; the right lower lobe is completely collapsed around several right lower lobe masses. 2. There is some speckled density in the left anterior sixth rib which is not entirely specific but which is concerning for early osseous metastatic disease given that it was not present on 11/23/2016. Consider a bone scan to further assess the skeleton for other occult bony spread. 3. Infrarenal abdominal aortic aneurysm 4.2 cm in diameter, increased from the prior measurement of 3.8 cm on 07/26/2016. Although this is only minor increase, it has occurred in a 7 month period. The patient has a previous existing relationship of cardiothoracic surgery and surveillance of this aneurysm by surgery is appropriate. 4. Other imaging findings of potential clinical significance: Coronary, aortic arch, and branch vessel atherosclerotic vascular disease. Mild lumbar spondylosis and degenerative disc disease. Electronically Signed   By: Van Clines M.D.   On: 02/21/2017 15:38   Ct Abdomen Pelvis W Contrast  Result Date: 02/21/2017 CLINICAL DATA:  Small cell lung cancer restaging. EXAM: CT CHEST, ABDOMEN, AND PELVIS WITH CONTRAST TECHNIQUE: Multidetector CT imaging of the chest, abdomen and pelvis was performed following the standard protocol during bolus administration of intravenous contrast. CONTRAST:  117m ISOVUE-300 IOPAMIDOL (ISOVUE-300) INJECTION 61% COMPARISON:   Multiple exams, including 11/23/2016 chest CT and prior PET-CT from 07/26/2016 FINDINGS: CT CHEST FINDINGS Cardiovascular: Coronary, aortic arch, and branch vessel atherosclerotic vascular disease. The extensive new mediastinal tumor is causing extrinsic compression of the right lower lobe pulmonary artery, which is likely also partially narrowed due to vaso constriction related to hypoaeration of the right lower lobe. Mediastinum/Nodes: Confluent right upper paratracheal adenopathy 3.6 cm in short axis on image 22/2, previously 0.6 cm. Masslike confluent subcarinal adenopathy along with right infrahilar adenopathy/tumor encases and occludes the right middle lobe and right lower lobe bronchi, with resulting complete atelectasis of the right lower lobe and some air trapping in the right middle lobe. The subcarinal lymph node measures about 4.5 cm in short axis on image 31/2 but is difficult separate from the surrounding infrahilar adenopathy which is likewise prominent. Lungs/Pleura: There are masslike densities within the collapsed right lower lobe, for example 1 measuring 3.1 by 3.9 cm on image 39 of series 2.  Musculoskeletal: There is some speckled heterogeneity in the left anterior sixth rib which seems new compared to the exam of 11/23/2016. Lower cervical spondylosis. Mild dextroconvex thoracic scoliosis. CT ABDOMEN PELVIS FINDINGS Hepatobiliary: Unremarkable Pancreas: Unremarkable Spleen: Unremarkable Adrenals/Urinary Tract: Slight nodularity of the left adrenal gland was not previously hypermetabolic and is probably benign/ incidental. Kidneys unremarkable. Stomach/Bowel: Unremarkable Vascular/Lymphatic: Aortoiliac atherosclerotic vascular disease. Fusiform infrarenal abdominal aortic aneurysm 4.2 cm in diameter on image 72/2, with associated mural thrombus. This extends down to the bifurcation. Reproductive: Uterus absent.  Adnexa unremarkable. Other: No supplemental non-categorized findings.  Musculoskeletal: Mild lumbar spondylosis and degenerative disc disease. IMPRESSION: 1. Unfortunately the patient has had significant recurrence with marked mediastinal and right infrahilar adenopathy as well as suspected masses in the collapsed right lower lobe. The confluent adenopathy completely occlude the right lower lobe and right middle lobe bronchi, although there is some air trapping in the right middle lobe ; the right lower lobe is completely collapsed around several right lower lobe masses. 2. There is some speckled density in the left anterior sixth rib which is not entirely specific but which is concerning for early osseous metastatic disease given that it was not present on 11/23/2016. Consider a bone scan to further assess the skeleton for other occult bony spread. 3. Infrarenal abdominal aortic aneurysm 4.2 cm in diameter, increased from the prior measurement of 3.8 cm on 07/26/2016. Although this is only minor increase, it has occurred in a 7 month period. The patient has a previous existing relationship of cardiothoracic surgery and surveillance of this aneurysm by surgery is appropriate. 4. Other imaging findings of potential clinical significance: Coronary, aortic arch, and branch vessel atherosclerotic vascular disease. Mild lumbar spondylosis and degenerative disc disease. Electronically Signed   By: Van Clines M.D.   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   S/P 6 cycles of cisplatin/etoposide (07/27/2016- 11/11/2016) with curative intent.  She was evaluated for XRT for involved field radiation following cycle #4, but due to tumor burden, she was deemed not an XRT candidate.  She refused whole brain radiation in the prophylactic setting following systemic chemotherapy. NOW WITH PROGRESSION/RELAPSE of disease with concern for osseous involvement on CT  imaging.  Oncology history is updated.  For patients with LS-SCLC treated with contemporary chemoradiotherapy and prophylactic cranial irradiation, overall response rates of 80 to 90 percent, including 50 to 60 percent complete response rates, are typically reported. Median survival is around 17 months, and the five-year survival rate is about 20 percent.  She is here today to review most recent restaging imaging.  CT CAP on 02/21/2017 are reviewed.  I personally reviewed and went over radiographic studies with the patient.  The results are noted within this dictation.  Unfortunately, progression of disease is appreciated.  As a result of the aforementioned, she is educated that her disease has returned.  Unfortunately, it has returned quickly following completion of platinum based therapy (within 4 months).    She will need MRI brain w and wo contrast to complete staging. Given concerns for osseous involvement on CT imaging, order are placed for bone scan.  If bone scan confirms osseous involvement of disease, then we will need to ascertain dental clearance and start Xgeva to reduce SRE.  I have reviewed the NCCN guidelines pertaining to second line treatment options.  Given her relapse <  6 months from Cisplatin therapy, she is not a candidate for re-challenge of this chemotherapeutic regimen.  I have printed a copy of the NCCN guidelines for her to review.  We discussed second line options including, Topotecan.  We discussed that chemotherapy regimen of Topotecan which is a treatment consisting of IV therapy on days 1-5 every every 3 weeks.  I discussed the risks, benefits, alternatives, and side effects of Topotecan therapy, including, but not limited to, fatigue, nausea, anorexia, diarrhea, vomiting, decrease in blood counts, increased risk for infection, febrile neutropenia, abdominal pain, increasing liver enzymes, weakness, dyspnea, fevers, sepsis.  Topotecan chemotherapy regimen is built via  VIA pathways.  I have refilled her Cymbalta Rx. I have refilled her Neurontin as well.  I will refer the patient for chemotherapy teaching.  We will start chemotherapy on Monday.  Message has been sent to Nurse Navigator.  Pre-treatment labs are ordered for cycle #1 and NADIR check.  Problem list reviewed with patient and edited accordingly.  Medications are reviewed with the patient and edited accordingly.  Return ~2 weeks for NADIR check with labs.  More than 50% of the time spent with the patient was utilized for counseling and coordination of care.    ORDERS PLACED FOR THIS ENCOUNTER: Orders Placed This Encounter  Procedures  . MR Brain W Wo Contrast  . NM Bone Scan Whole Body  . CBC with Differential  . Comprehensive metabolic panel  . Magnesium  . CBC with Differential  . Comprehensive metabolic panel    MEDICATIONS PRESCRIBED THIS ENCOUNTER: Meds ordered this encounter  Medications  . DULoxetine (CYMBALTA) 60 MG capsule    Sig: Take 1 capsule (60 mg total) by mouth daily.    Dispense:  30 capsule    Refill:  3    Order Specific Question:   Supervising Provider    Answer:   Brunetta Genera [[id number]]  . DISCONTD: gabapentin (NEURONTIN) 300 MG capsule    Sig: Take 300 mg by mouth 4 (four) times daily.  Marland Kitchen gabapentin (NEURONTIN) 300 MG capsule    Sig: Take 1 capsule (300 mg total) by mouth 4 (four) times daily.    Dispense:  120 capsule    Refill:  3    Order Specific Question:   Supervising Provider    Answer:   Brunetta Genera [[id number]]    THERAPY PLAN:  We will plan on starting second line/salvage therapy with Topotecan on 02/28/2017 we will plan on restaging her following cycle #2 or #3 based upon clinical situation.  All questions were answered. The patient knows to call the clinic with any problems, questions or concerns. We can certainly see the patient much sooner if necessary.  Patient and plan discussed with Dr. Twana First and she is in  agreement with the aforementioned.   This note is electronically signed by: Robynn Pane, PA-C 02/22/2017 6:18 PM

## 2017-02-22 NOTE — Assessment & Plan Note (Addendum)
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   S/P 6 cycles of cisplatin/etoposide (07/27/2016- 11/11/2016) with curative intent.  She was evaluated for XRT for involved field radiation following cycle #4, but due to tumor burden, she was deemed not an XRT candidate.  She refused whole brain radiation in the prophylactic setting following systemic chemotherapy. NOW WITH PROGRESSION/RELAPSE of disease with concern for osseous involvement on CT imaging.  Oncology history is updated.  For patients with LS-SCLC treated with contemporary chemoradiotherapy and prophylactic cranial irradiation, overall response rates of 80 to 90 percent, including 50 to 60 percent complete response rates, are typically reported. Median survival is around 17 months, and the five-year survival rate is about 20 percent.  She is here today to review most recent restaging imaging.  CT CAP on 02/21/2017 are reviewed.  I personally reviewed and went over radiographic studies with the patient.  The results are noted within this dictation.  Unfortunately, progression of disease is appreciated.  As a result of the aforementioned, she is educated that her disease has returned.  Unfortunately, it has returned quickly following completion of platinum based therapy (within 4 months).    She will need MRI brain w and wo contrast to complete staging. Given concerns for osseous involvement on CT imaging, order are placed for bone scan.  If bone scan confirms osseous involvement of disease, then we will need to ascertain dental clearance and start Xgeva to reduce SRE.  I have reviewed the NCCN guidelines pertaining to second line treatment options.  Given her relapse < 6 months from Cisplatin therapy, she is not a candidate for re-challenge of this chemotherapeutic regimen.  I have printed a copy of the NCCN guidelines for her to review.  We discussed second line options including, Topotecan.  We discussed that  chemotherapy regimen of Topotecan which is a treatment consisting of IV therapy on days 1-5 every every 3 weeks.  I discussed the risks, benefits, alternatives, and side effects of Topotecan therapy, including, but not limited to, fatigue, nausea, anorexia, diarrhea, vomiting, decrease in blood counts, increased risk for infection, febrile neutropenia, abdominal pain, increasing liver enzymes, weakness, dyspnea, fevers, sepsis.  Topotecan chemotherapy regimen is built via VIA pathways.  I have refilled her Cymbalta Rx. I have refilled her Neurontin as well.  I will refer the patient for chemotherapy teaching.  We will start chemotherapy on Monday.  Message has been sent to Nurse Navigator.  Pre-treatment labs are ordered for cycle #1 and NADIR check.  Problem list reviewed with patient and edited accordingly.  Medications are reviewed with the patient and edited accordingly.  Return ~2 weeks for NADIR check with labs.  More than 50% of the time spent with the patient was utilized for counseling and coordination of care.

## 2017-02-22 NOTE — Progress Notes (Signed)
DISCONTINUE ON PATHWAY REGIMEN - Small Cell Lung     A cycle is every 21 days:     Etoposide        Dose Mod: None     Cisplatin        Dose Mod: None  **Always confirm dose/schedule in your pharmacy ordering system**    REASON: Disease Progression PRIOR TREATMENT: LOS15: Etoposide Days 1, 2, 3 + Cisplatin Day 1 q21 Days x 4 Cycles with Concurrent Radiation** TREATMENT RESPONSE: Partial Response (PR)  START ON PATHWAY REGIMEN - Small Cell Lung     A cycle is every 21 days:     Topotecan   **Always confirm dose/schedule in your pharmacy ordering system**    Patient Characteristics: Extensive and Limited Stage, Second Line, Relapse 3 - 6 Months Stage Grouping: Extensive AJCC T Category: T2 AJCC N Category: N2 AJCC M Category: M1c AJCC 8 Stage Grouping: IVB Line of therapy: Second Line Would you be surprised if this patient died  in the next year? I would NOT be surprised if this patient died in the next year Time to Relapse: Relapse 3 - 6 Months  Intent of Therapy: Non-Curative / Palliative Intent, Discussed with Patient

## 2017-02-22 NOTE — Patient Instructions (Addendum)
[place] at [facility] Discharge Instructions  RECOMMENDATIONS MADE BY THE CONSULTANT AND ANY TEST RESULTS WILL BE SENT TO YOUR REFERRING PHYSICIAN.  You were seen today by Kirby Crigler PA-C. MRI of brain and bone scan ordered. Refills given on Cymbalta and Gabapentin. Start Chemo on [place]. Return in 2 weeks for NADIR check, labs and follow up.    Thank you for choosing [place] at [facility] to provide your oncology and hematology care.  To afford each patient quality time with our provider, please arrive at least 15 minutes before your scheduled appointment time.    If you have a lab appointment with the [facility] please come in thru the  Main Entrance and check in at the main information desk  You need to re-schedule your appointment should you arrive 10 or more minutes late.  We strive to give you quality time with our providers, and arriving late affects you and other patients whose appointments are after yours.  Also, if you no show three or more times for appointments you may be dismissed from the clinic at the providers discretion.     Again, thank you for choosing [facility].  Our hope is that these requests will decrease the amount of time that you wait before being seen by our physicians.       _____________________________________________________________  Should you have questions after your visit to [facility], please contact our office at [phone] between the hours of 8:30 a.m. and 4:30 p.m.  Voicemails left after 4:30 p.m. will not be returned until the following business day.  For prescription refill requests, have your pharmacy contact our office.       Resources For Cancer Patients and their Caregivers ? American Cancer Society: Can assist with transportation, wigs, general needs, runs Look Good Feel Better.        979-475-7353 ? Cancer Care: Provides financial assistance,  online support groups, medication/co-pay assistance.  1-800-813-HOPE 587-727-3303) ? [place] Assists [place] cancer patients and their families through emotional , educational and financial support.  231-580-3851 ? [place] DSS Where to apply for food stamps, Medicaid and utility assistance. 9137572579 ? [facility]: Transportation to medical appointments. (434) 163-8343 ? Social Security Administration: May apply for disability if have a Stage IV cancer. (313) 736-3311 6843057907 ? [employer], Disability and Transit Services: Assists with nutrition, care and transit needs. [place] Support Programs: '@10RELATIVEDAYS'$ @ > Cancer Support Group  2nd Tuesday of the month 1pm-2pm, Journey Room  > Creative Journey  3rd Tuesday of the month 1130am-1pm, Journey Room  > Look Good Feel Better  1st Wednesday of the month 10am-12 noon, Journey Room (Call [place] to register [phone])

## 2017-02-24 ENCOUNTER — Encounter (HOSPITAL_COMMUNITY): Payer: Managed Care, Other (non HMO)

## 2017-02-24 DIAGNOSIS — C801 Malignant (primary) neoplasm, unspecified: Secondary | ICD-10-CM

## 2017-02-24 MED ORDER — PROCHLORPERAZINE MALEATE 10 MG PO TABS: 10.0000 mg | ORAL_TABLET | Freq: Four times a day (QID) | ORAL | 1 refills | Status: DC | PRN

## 2017-02-24 MED ORDER — LIDOCAINE-PRILOCAINE 2.5-2.5 % EX CREA: TOPICAL_CREAM | CUTANEOUS | 3 refills | Status: DC

## 2017-02-24 NOTE — Progress Notes (Signed)
Chemotherapy teaching pulled together. 

## 2017-02-24 NOTE — Patient Instructions (Signed)
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  This treatment is palliative or non-curative.  This means that you are not curable but treatable. This treatment will be Monday-Friday every 3 weeks.  You will continue taking this treatment every 3 weeks until you progress or you can no longer tolerate the drug.   You will see the doctor regularly throughout treatment.  We monitor your lab work prior to every treatment.  The doctor monitors your response to treatment by the way you are feeling, your blood work, and scans periodically.     You will receive pre-meds prior to your chemotherapy. You will receive compazine (a nausea pill) on days 1-5. On day 5 after your chemotherapy has been completed you will get neulasta.   Neulasta - this medication is not chemo but being given because you have had chemo. It is usually given 24-27 hours after the completion of chemotherapy. This medication works by boosting your bone marrow's supply of white blood cells. White blood cells are what protect our bodies against infection. The medication is given in the form of a subcutaneous injection. It is given in the fatty tissue of your abdomen. It is a short needle. The major side effect of this medication is bone or muscle pain. The drug of choice to relieve or lessen the pain is Aleve or Ibuprofen. If a physician has ever told you not to take Aleve or Ibuprofen - then don't take it. You should then take Tylenol/acetaminophen. Take either medication as the bottle directs you to.  The level of pain you experience as a result of this injection can range from none, to mild or moderate, or severe. Please let us know if you develop moderate or severe bone pain.   **DO NOT expose the Neulasta On-body injector to diagnostic imaging (CT scans, MRI,  Ultrasound, X-ray), radiation treatment, or oxygen rich environments, such as hyperbaric chambers. **     Topotecan Hydrochloride (Generic Name) Other Names: HycamtinTM   About This Drug Topotecan hydrochloride is used to treat cancer. It is given in the vein (IV). (this drug takes 30 minutes to infuse)   POTENTIAL SIDE EFFECTS OF TREATMENT:   Possible Side Effects (More Common) . Bone marrow depression. This is a decrease in the number of white blood cells, red blood cells, and platelets. This may raise your risk of infection, make you tired and weak (fatigue), and raise your risk of bleeding. . Constipation (not able to move bowels) . Loose bowel movements (diarrhea) that may last for a few days . Hair loss. You may notice your hair getting thin. Some patients lose their hair. Your hair often grows back when treatment is done. . Nausea and throwing up (vomiting). These symptoms may happen within a few hours or many hours after your treatment. Medicines are available to stop or lessen these side effects. . Feeling tired   Possible Side Effects (Less Common) . Changes in your liver function. Your doctor will check your liver function as needed . Rash   Treating Side Effects . Ask your doctor or nurse about medicine that is available to help stop or lessen constipation. . If you cannot move your bowels, check with your doctor or nurse before you use any enemas, laxatives, or suppositories . Drink 6-8 cups of fluids  each day unless your doctor has told you to limit your fluid intake due to some other health problem. A cup is 8 ounces of fluid. If you throw up or have loose bowel movements, you should drink more fluids so that you do not become dehydrated (lack water in the body from losing too much fluid).Ask your doctor or nurse about medication that is available to help prevent or lessen diarrhea. . Speak with your nurse about obtaining a wig before you lose your hair. Also, call the  [facility] at [phone]-[phone] to find out information about the " Look Good, Feel Better" program close to where you live. It is a free program where women undergoing chemotherapy learn about wigs, turbans and scarves as well as makeup techniques and skin and nail care. . If you get a rash, do not put anything on it unless your doctor or nurse says you may. Keep the area around the rash clean and dry. Ask your doctor for medicine if your rash bothers you.   Allergic Reactions Allergic reactions including anaphylaxis are rare but may happen in some patients. Signs of allergic reaction to this drug may be swelling of the face, feeling like your tongue or throat are swelling, trouble breathing, rash, itching, fever, chills, feeling dizzy, and/or feeling that your heart is beating in a fast or not normal way. If this happens, do not take another dose of this drug. You should get urgent medical treatment.   Food and Drug Interactions There are no known interactions of topotecan with food. This drug may interact with other medicines. Tell your doctor and pharmacist about all the medicines and dietary supplements (vitamins, minerals, herbs and others) that you are taking at this time. The safety and use of dietary supplements and alternative diets are often not known. Using these might affect your cancer or interfere with your treatment. Until more is known, you should not use dietary supplements or alternative diets without your cancer doctor's help.   When to Call the Doctor Call your doctor or nurse right away if you have any of these symptoms: . No bowel movement in 3 days or if you feel uncomfortable. . Loose bowel movements (diarrhea) 4 times in one day or diarrhea with weakness or feeling lightheaded . Fever of 100.5 F (38 C) or above . Easy bruising or bleeding . Wheezing or difficulty breathing . Rash or itching . Feeling dizzy or lightheaded . Nausea that stops you from eating or  drinking . Throwing up more than 3 times a day . Signs of liver problems: dark urine, pale stools, bad stomach pain, feeling very tired and weak, unusual itching, or yellowing of the eyes or skin Call your doctor or nurse as soon as possible if any of the following symptoms occur: . Pain in your mouth or throat that makes it hard to eat or drink . Nausea that is not relieved by prescribed medicines . Rash that is not relieved by prescribed medicines . Headache that does not go away   Reproduction Concerns . Pregnancy warning: This drug may have harmful effects on the unborn child, so effective methods of birth control should be used during your cancer treatment. . Genetic counseling is available for you to talk about the effects of this drug therapy on future pregnancies. Also, a genetic counselor can look at the possible risk of problems in the unborn baby due to this medicine if an exposure happens during pregnancy. . Breast feeding warning: It  is not known if this drug passes into breast milk. For this reason, women should talk to their doctor about the risks and benefits of breast feeding during treatment with this drug because this drug may enter the breast milk and badly harm a breast feeding baby.       SELF CARE ACTIVITIES WHILE ON CHEMOTHERAPY: Hydration Increase your fluid intake 48 hours prior to treatment and drink at least 8 to 12 cups (64 ounces) of water/decaff beverages per day after treatment. You can still have your cup of coffee or soda but these beverages do not count as part of your 8 to 12 cups that you need to drink daily. No alcohol intake.   Medications Continue taking your normal prescription medication as prescribed.  If you start any new herbal or new supplements please let us know first to make sure it is safe.   Mouth Care Have teeth cleaned professionally before starting treatment. Keep dentures and partial plates clean. Use soft toothbrush and do not use  mouthwashes that contain alcohol. Biotene is a good mouthwash that is available at most pharmacies or may be ordered by calling [phone]. Use warm salt water gargles (1 teaspoon salt per 1 quart warm water) before and after meals and at bedtime. Or you may rinse with 2 tablespoons of three-percent hydrogen peroxide mixed in eight ounces of water. If you are still having problems with your mouth or sores in your mouth please call the clinic. If you need dental work, please let the doctor know before you go for your appointment so that we can coordinate the best possible time for you in regards to your chemo regimen. You need to also let your dentist know that you are actively taking chemo. We may need to do labs prior to your dental appointment.     Skin Care Always use sunscreen that has not expired and with SPF (Sun Protection Factor) of 50 or higher. Wear hats to protect your head from the sun. Remember to use sunscreen on your hands, ears, face, & feet.  Use good moisturizing lotions such as udder cream, eucerin, or even Vaseline. Some chemotherapies can cause dry skin, color changes in your skin and nails.      Avoid long, hot showers or baths.  Use gentle, fragrance-free soaps and laundry detergent.  Use moisturizers, preferably creams or ointments rather than lotions because the thicker consistency is better at preventing skin dehydration. Apply the cream or ointment within 15 minutes of showering. Reapply moisturizer at night, and moisturize your hands every time after you wash them.   Hair Loss (if your doctor says your hair will fall out)    If your doctor says that your hair is likely to fall out, decide before you begin chemo whether you want to wear a wig. You may want to shop before treatment to match your hair color.  Hats, turbans, and scarves can also camouflage hair loss, although some people prefer to leave their heads uncovered. If you go bare-headed outdoors, be sure to  use sunscreen on your scalp.  Cut your hair short. It eases the inconvenience of shedding lots of hair, but it also can reduce the emotional impact of watching your hair fall out.  Don't perm or color your hair during chemotherapy. Those chemical treatments are already damaging to hair and can enhance hair loss. Once your chemo treatments are done and your hair has grown back, it's OK to resume dyeing or perming hair. With  chemotherapy, hair loss is almost always temporary. But when it grows back, it may be a different color or texture. In older adults who still had hair color before chemotherapy, the new growth may be completely gray.  Often, new hair is very fine and soft.   Infection Prevention Please wash your hands for at least 30 seconds using warm soapy water. Handwashing is the #1 way to prevent the spread of germs. Stay away from sick people or people who are getting over a cold. If you develop respiratory systems such as green/yellow mucus production or productive cough or persistent cough let us know and we will see if you need an antibiotic. It is a good idea to keep a pair of gloves on when going into grocery stores/[employer] to decrease your risk of coming into contact with germs on the carts, etc. Carry alcohol hand gel with you at all times and use it frequently if out in public. If your temperature reaches 100.5 or higher please call the clinic and let us know.  If it is after hours or on the weekend please go to the ER if your temperature is over 100.5.  Please have your own personal thermometer at home to use.     Sex and bodily fluids If you are going to have sex, a condom must be used to protect the person that isn't taking chemotherapy. Chemo can decrease your libido (sex drive). For a few days after chemotherapy, chemotherapy can be excreted through your bodily fluids.  When using the toilet please close the lid and flush the toilet twice.  Do this for a few day after you have had  chemotherapy.      Effects of chemotherapy on your sex life Some changes are simple and won't last long. They won't affect your sex life permanently. Sometimes you may feel:  too tired  not strong enough to be very active  sick or sore   not in the mood  anxious or low Your anxiety might not seem related to sex. For example, you may be worried about the cancer and how your treatment is going. Or you may be worried about money, or about how you family are coping with your illness. These things can cause stress, which can affect your interest in sex. It's important to talk to your partner about how you feel. Remember - the changes to your sex life don't usually last long. There's usually no medical reason to stop having sex during chemo. The drugs won't have any long term physical effects on your performance or enjoyment of sex. Cancer can't be passed on to your partner during sex   Contraception It's important to use reliable contraception during treatment. Avoid getting pregnant while you or your partner are having chemotherapy. This is because the drugs may harm the baby. Sometimes chemotherapy drugs can leave a man or woman infertile.  This means you would not be able to have children in the future. You might want to talk to someone about permanent infertility. It can be very difficult to learn that you may no longer be able to have children. Some people find counselling helpful. There might be ways to preserve your fertility, although this is easier for men than for women. You may want to speak to a fertility expert. You can talk about sperm banking or harvesting your eggs. You can also ask about other fertility options, such as donor eggs. If you have or have had breast cancer, your doctor might  advise you not to take the contraceptive pill. This is because the hormones in it might affect the cancer.  It is not known for sure whether or not chemotherapy drugs can be passed on through  semen or secretions from the vagina. Because of this some doctors advise people to use a barrier method (such as condoms, femidoms or dental dams) if you have sex during treatment. This applies to vaginal, anal or oral sex. Generally, doctors advise a barrier method only for the time you are actually having the treatment and for about a week after your treatment. Advice like this can be worrying, but this does not mean that you have to avoid being intimate with your partner. You can still have close contact with your partner and continue to enjoy sex.   Animals If you have cats or birds we just ask that you not change the litter or change the cage.  Please have someone else do this for you while you are on chemotherapy.    Food Safety During and After Cancer Treatment Food safety is important for people both during and after cancer treatment. Cancer and cancer treatments, such as chemotherapy, radiation therapy, and stem cell/bone marrow transplantation, often weaken the immune system. This makes it harder for your body to protect itself from foodborne illness, also called food poisoning. Foodborne illness is caused by eating food that contains harmful bacteria, parasites, or viruses.   Foods to avoid Some foods have a higher risk of becoming tainted with bacteria. These include:  Unwashed fresh fruit and vegetables, especially leafy vegetables that can hide dirt and other contaminants  Raw sprouts, such as alfalfa sprouts  Raw or undercooked beef, especially ground beef, or other raw or undercooked meat and poultry  Cold hot dogs or deli lunch meat (cold cuts), including dry-cured, uncooked salami. Always cook or reheat these foods until they are steaming hot.  Fatty, fried, or spicy foods immediately before or after treatment.  These can sit heavy on your stomach and make you feel nauseous.  Raw or undercooked shellfish, such as oysters.  Sushi and sashimi, which often contain raw fish.    Unpasteurized beverages, such as unpasteurized fruit juices, raw milk, raw yogurt, or cider  Soft cheeses made from unpasteurized milk, such as blue-veined (a type of blue cheese), Brie, Camembert, feta, goat cheese, and queso fresco or blanco  Undercooked eggs, such as soft boiled, over easy, and poached; raw, unpasteurized eggs; or foods made with raw egg, such as homemade raw cookie dough and homemade mayonnaise  Deli-prepared salads with egg, ham, chicken, or seafood Simple steps for food safety Shop smart.  Do not buy food stored or displayed in an unclean area.  Do not buy bruised or damaged fruits or vegetables.  Do not buy cans that have cracks, dents, or bulges.  Pick up foods that can spoil at the end of your shopping trip and store them in a cooler on the way home. Prepare and clean up foods carefully.  Rinse all fresh fruits and vegetables under running water, and dry them with a clean towel or paper towel.  Clean the top of cans before opening them.  After preparing food, wash your hands for 20 seconds with hot water and soap. Pay special attention to areas between fingers and under nails.  Clean your utensils and dishes with hot water and soap.  Disinfect your kitchen and cutting boards using 1 teaspoon of liquid, unscented bleach mixed into 1 quart of water.  Prevent cross-contamination.  Keep raw meat, poultry, and fish or their juices away from other food. Bacteria can spread through contact with the food or its liquid, causing cross-contamination.  Do not rinse raw meat or poultry because it can spread bacteria to nearby surfaces.  Wash all items you used for preparing raw foods, including utensils, cutting board, and plates, before using them for other foods or cooked meat.  Set aside a specific cutting board for preparing uncooked meat, fish, and chicken. Never use it for uncooked fruits, vegetables, or other foods. Dispose of old food.  Eat canned and  packaged food before its expiration date (the "use by" or "best before" date).  Consume refrigerated leftovers within 3 to 4 days. After that time, throw out the food. Even if the food does not smell or look spoiled, it still may be unsafe. Some bacteria, such as Listeria, can grow even on foods stored in the refrigerator if they are kept for too long. Take precautions when eating out.  At restaurants, avoid buffets and salad bars where food sits out for a long time and comes in contact with many people. Food can become contaminated when someone with a virus, often a norovirus, or another "bug" handles it.  Put any leftover food in a "to-go" container yourself, rather than having the server do it. And, refrigerate leftovers as soon as you get home.  Choose restaurants that are clean and that are willing to prepare your food as you order it cooked. Cook food to the right temperature. Use a food thermometer to check for a safe internal temperature of all poultry and meat. For instance, a hamburger should be cooked to at least medium (160?F or 71?C). Get a full list of recommended internal cooking temperatures on the website of the U[place]S. Food and Drug Administration (FDA).   Chill food promptly. Refrigerate or freeze perishable food within 2 hours of cooking or buying it (sooner in warm weather.) Proper cooking destroys bacteria, but they can still grow on cooked food that is left out too long. Food stored in the refrigerator should be kept at below 40?F (4?C). And, food stored in the freezer should be kept below 32?F (0?C). Thaw food properly. Thaw frozen food in the refrigerator rather than at room temperature. You can also thaw food in frequently changed cold water or in the microwave, but cook it as soon as it thaws.         MEDICATIONS: Zofran/Ondansetron '8mg'$  tablet. Take 1 tablet every 8 hours as needed for nausea/vomiting. (#1 nausea med to take, this can constipate)    Compazine/Prochlorperazine '10mg'$  tablet. Take 1 tablet every 6 hours as needed for nausea/vomiting. (#2 nausea med to take, this can make you sleepy)     EMLA cream. Apply a quarter size amount to port site 1 hour prior to chemo. Do not rub in. Cover with plastic wrap.     Over-the-Counter Meds:   Miralax 1 capful in 8 oz of fluid daily. May increase to two times a day if needed. This is a stool softener. If this doesn't work proceed you can add:   Senokot S-start with 1 tablet two times a day and increase to 4 tablets two times a day if needed. (total of 8 tablets in a 24 hour period). This is a stimulant laxative.    Call us if this does not help your bowels move.    Imodium '2mg'$  capsule. Take 2 capsules after the 1st loose stool and  then 1 capsule every 2 hours until you go a total of 12 hours without having a loose stool. Call the [facility] if loose stools continue. If diarrhea occurs @ bedtime, take 2 capsules @ bedtime. Then take 2 capsules every 4 hours until morning. Call [facility].         Diarrhea Sheet  If you are having loose stools/diarrhea, please purchase Imodium and begin taking as outlined:  At the first sign of poorly formed or loose stools you should begin taking Imodium(loperamide) 2 mg capsules.  Take two caplets ('4mg'$ ) followed by one caplet ('2mg'$ ) every 2 hours until you have had no diarrhea for 12 hours.  During the night take two caplets ('4mg'$ ) at bedtime and continue every 4 hours during the night until the morning.  Stop taking Imodium only after there is no sign of diarrhea for 12 hours.     Always call the [facility] if you are having loose stools/diarrhea that you can't get under control.  Loose stools/disrrhea leads to dehydration (loss of water) in your body.  We have other options of trying to get the loose stools/diarrhea to stopped but you must let us know!       Constipation Sheet *Miralax in 8 oz of fluid daily.  May increase to two times a day  if needed.  This is a stool softener.  If this not enough to keep your bowel regular:   You can add:   *Senokot S, start with one tablet twice a day and can increase to 4 tablets twice a day if needed.  This is a stimulant laxative.     Sometimes when you take pain medication you need BOTH a medicine to keep your stool soft and a medicine to help your bowel push it out!   Please call if the above does not work for you.    Do not go more than 2 days without a bowel movement.  It is very important that you do not become constipated.  It will make you feel sick to your stomach (nausea) and can cause abdominal pain and vomiting.       Nausea Sheet  Zofran/Ondansetron '8mg'$  tablet. Take 1 tablet every 8 hours as needed for nausea/vomiting. (#1 nausea med to take, this can constipate)   Compazine/Prochlorperazine '10mg'$  tablet. Take 1 tablet every 6 hours as needed for nausea/vomiting. (#2 nausea med to take, this can make you sleepy)   You can take these medications together or separately.  We would first like for you to try the Ondansetron by itself and then take the Prochloperizine if needed. But you are allowed to take both medications at the same time if your nausea is that severe.  If you are having persistent nausea (nausea that does not stop) please take these medications on a staggered schedule so that the nausea medication stays in your body.  Please call the [facility] and let us know the amount of nausea that you are experiencing.  If you begin to vomit, you need to call the [facility] and if it is the weekend and you have vomited more than one time and cant get it to stop-go to the Emergency Room.  Persistent nausea/vomiting can lead to dehydration (loss of fluid in your body) and will make you feel terrible.    Ice chips, sips of clear liquids, foods that are @ room temperature, crackers, and toast tend to be better tolerated.  SYMPTOMS TO REPORT AS SOON AS POSSIBLE AFTER  TREATMENT:  FEVER GREATER THAN 100.5 F  CHILLS WITH OR WITHOUT FEVER  NAUSEA AND VOMITING THAT IS NOT CONTROLLED WITH YOUR NAUSEA MEDICATION  UNUSUAL SHORTNESS OF BREATH  UNUSUAL BRUISING OR BLEEDING  TENDERNESS IN MOUTH AND THROAT WITH OR WITHOUT PRESENCE OF ULCERS  URINARY PROBLEMS  BOWEL PROBLEMS  UNUSUAL RASH     Wear comfortable clothing and clothing appropriate for easy access to any Portacath or PICC line. Let us know if there is anything that we can do to make your therapy better!       What to do if you need assistance after hours or on the weekends: CALL [phone].  HOLD on the line, do not hang up.  You will hear multiple messages but at the end you will be connected with a nurse triage line.  They will contact the doctor if necessary.  Most of the time they will be able to assist you.  Do not call the hospital operator.       I have been informed and understand all of the instructions given to me and have received a copy. I have been instructed to call the clinic [phone] or my family physician as soon as possible for continued medical care, if indicated. I do not have any more questions at this time but understand that I may call the [facility] at [phone] during office hours should I have questions or need assistance in obtaining follow-up care.

## 2017-02-28 ENCOUNTER — Encounter (HOSPITAL_COMMUNITY): Payer: Self-pay

## 2017-02-28 ENCOUNTER — Inpatient Hospital Stay (HOSPITAL_COMMUNITY): Payer: Managed Care, Other (non HMO)

## 2017-02-28 ENCOUNTER — Other Ambulatory Visit (HOSPITAL_COMMUNITY): Payer: Self-pay | Admitting: Oncology

## 2017-02-28 ENCOUNTER — Encounter (HOSPITAL_BASED_OUTPATIENT_CLINIC_OR_DEPARTMENT_OTHER): Payer: Managed Care, Other (non HMO)

## 2017-02-28 VITALS — BP 156/92 | HR 74 | Temp 97.8°F | Resp 18 | Wt 139.2 lb

## 2017-02-28 DIAGNOSIS — E876 Hypokalemia: Secondary | ICD-10-CM

## 2017-02-28 DIAGNOSIS — C3491 Malignant neoplasm of unspecified part of right bronchus or lung: Secondary | ICD-10-CM | POA: Diagnosis not present

## 2017-02-28 DIAGNOSIS — Z5111 Encounter for antineoplastic chemotherapy: Secondary | ICD-10-CM

## 2017-02-28 DIAGNOSIS — C801 Malignant (primary) neoplasm, unspecified: Secondary | ICD-10-CM

## 2017-02-28 LAB — HEPATIC FUNCTION PANEL
ALBUMIN: 4.3 g/dL (ref 3.5–5.0)
ALT: 21 U/L (ref 14–54)
AST: 28 U/L (ref 15–41)
Alkaline Phosphatase: 73 U/L (ref 38–126)
BILIRUBIN DIRECT: 0.1 mg/dL (ref 0.1–0.5)
Indirect Bilirubin: 0.7 mg/dL (ref 0.3–0.9)
TOTAL PROTEIN: 7.2 g/dL (ref 6.5–8.1)
Total Bilirubin: 0.8 mg/dL (ref 0.3–1.2)

## 2017-02-28 LAB — CBC WITH DIFFERENTIAL/PLATELET
BASOS PCT: 0 %
Basophils Absolute: 0 10*3/uL (ref 0.0–0.1)
EOS PCT: 1 %
Eosinophils Absolute: 0.1 10*3/uL (ref 0.0–0.7)
HCT: 38.2 % (ref 36.0–46.0)
Hemoglobin: 13.1 g/dL (ref 12.0–15.0)
Lymphocytes Relative: 16 %
Lymphs Abs: 0.8 10*3/uL (ref 0.7–4.0)
MCH: 30.9 pg (ref 26.0–34.0)
MCHC: 34.3 g/dL (ref 30.0–36.0)
MCV: 90.1 fL (ref 78.0–100.0)
MONO ABS: 0.3 10*3/uL (ref 0.1–1.0)
Monocytes Relative: 7 %
Neutro Abs: 3.6 10*3/uL (ref 1.7–7.7)
Neutrophils Relative %: 76 %
PLATELETS: 147 10*3/uL — AB (ref 150–400)
RBC: 4.24 MIL/uL (ref 3.87–5.11)
RDW: 12.9 % (ref 11.5–15.5)
WBC: 4.8 10*3/uL (ref 4.0–10.5)

## 2017-02-28 LAB — BASIC METABOLIC PANEL
Anion gap: 9 (ref 5–15)
BUN: 18 mg/dL (ref 6–20)
CALCIUM: 9.2 mg/dL (ref 8.9–10.3)
CO2: 26 mmol/L (ref 22–32)
Chloride: 98 mmol/L — ABNORMAL LOW (ref 101–111)
Creatinine, Ser: 0.7 mg/dL (ref 0.44–1.00)
Glucose, Bld: 152 mg/dL — ABNORMAL HIGH (ref 65–99)
Potassium: 3.1 mmol/L — ABNORMAL LOW (ref 3.5–5.1)
SODIUM: 133 mmol/L — AB (ref 135–145)

## 2017-02-28 MED ORDER — PROCHLORPERAZINE MALEATE 10 MG PO TABS
10.0000 mg | ORAL_TABLET | Freq: Once | ORAL | Status: AC
  Administered 2017-02-28: 10 mg via ORAL

## 2017-02-28 MED ORDER — HEPARIN SOD (PORK) LOCK FLUSH 100 UNIT/ML IV SOLN
500.0000 [IU] | Freq: Once | INTRAVENOUS | Status: AC | PRN
  Administered 2017-02-28: 500 [IU]

## 2017-02-28 MED ORDER — PROCHLORPERAZINE MALEATE 10 MG PO TABS
ORAL_TABLET | ORAL | Status: AC
  Filled 2017-02-28: qty 1

## 2017-02-28 MED ORDER — POTASSIUM CHLORIDE CRYS ER 20 MEQ PO TBCR: 20.0000 meq | EXTENDED_RELEASE_TABLET | Freq: Two times a day (BID) | ORAL | 0 refills | Status: DC

## 2017-02-28 MED ORDER — POTASSIUM CHLORIDE CRYS ER 20 MEQ PO TBCR: 60.0000 meq | EXTENDED_RELEASE_TABLET | Freq: Once | ORAL | Status: DC

## 2017-02-28 MED ORDER — SODIUM CHLORIDE 0.9 % IV SOLN
Freq: Once | INTRAVENOUS | Status: AC
  Administered 2017-02-28: 15:00:00 via INTRAVENOUS

## 2017-02-28 MED ORDER — SODIUM CHLORIDE 0.9% FLUSH
10.0000 mL | INTRAVENOUS | Status: DC | PRN
  Administered 2017-02-28: 10 mL
  Filled 2017-02-28: qty 10

## 2017-02-28 MED ORDER — TOPOTECAN HCL CHEMO INJECTION 4 MG
1.5000 mg/m2 | Freq: Once | INTRAVENOUS | Status: AC
  Administered 2017-02-28: 2.6 mg via INTRAVENOUS
  Filled 2017-02-28: qty 2.6

## 2017-02-28 NOTE — Progress Notes (Signed)
Chemotherapy given today as ordered. Patient tolerated it well, no issues. Vitals stable and discharged home from clinic ambulatory.

## 2017-02-28 NOTE — Progress Notes (Signed)
Consent signed.  Teaching completed.  Extensive teaching packet given.

## 2017-02-28 NOTE — Patient Instructions (Signed)
[facility] Discharge Instructions for Patients Receiving Chemotherapy   Beginning January 23rd 2017 lab work for the Cancer Center will be done in the  Main lab at [place] on 1st floor. If you have a lab appointment with the Cancer Center please come in thru the  Main Entrance and check in at the main information desk   Today you received the following chemotherapy agents   To help prevent nausea and vomiting after your treatment, we encourage you to take your nausea medication     If you develop nausea and vomiting, or diarrhea that is not controlled by your medication, call the clinic.  The clinic phone number is [phone]. Office hours are Monday-Friday 8:30am-5:00pm.  BELOW ARE SYMPTOMS THAT SHOULD BE REPORTED IMMEDIATELY:  *FEVER GREATER THAN 101.0 F  *CHILLS WITH OR WITHOUT FEVER  NAUSEA AND VOMITING THAT IS NOT CONTROLLED WITH YOUR NAUSEA MEDICATION  *UNUSUAL SHORTNESS OF BREATH  *UNUSUAL BRUISING OR BLEEDING  TENDERNESS IN MOUTH AND THROAT WITH OR WITHOUT PRESENCE OF ULCERS  *URINARY PROBLEMS  *BOWEL PROBLEMS  UNUSUAL RASH Items with * indicate a potential emergency and should be followed up as soon as possible. If you have an emergency after office hours please contact your primary care physician or go to the nearest emergency department.  Please call the clinic during office hours if you have any questions or concerns.   You may also contact the Patient Navigator at [phone] should you have any questions or need assistance in obtaining follow up care.      Resources For Cancer Patients and their Caregivers ? American Cancer Society: Can assist with transportation, wigs, general needs, runs Look Good Feel Better.        1-888-227-6333 ? Cancer Care: Provides financial assistance, online support groups, medication/co-pay assistance.  1-800-813-HOPE (4673) ? [facility] Assists [place] cancer  patients and their families through emotional , educational and financial support.  336-427-4357 ? [place] DSS Where to apply for food stamps, Medicaid and utility assistance. 336-342-1394 ? [employer]: Transportation to medical appointments. 336-347-2287 ? Social Security Administration: May apply for disability if have a Stage IV cancer. 336-342-7796 1-800-772-1213 ? [place] [facility], Disability and Transit Services: Assists with nutrition, care and transit needs. 336-349-2343         

## 2017-03-01 ENCOUNTER — Ambulatory Visit (HOSPITAL_COMMUNITY): Payer: Managed Care, Other (non HMO)

## 2017-03-01 ENCOUNTER — Encounter (HOSPITAL_COMMUNITY): Payer: Self-pay | Admitting: Lab

## 2017-03-01 ENCOUNTER — Encounter (HOSPITAL_BASED_OUTPATIENT_CLINIC_OR_DEPARTMENT_OTHER): Payer: Managed Care, Other (non HMO)

## 2017-03-01 VITALS — BP 113/96 | HR 91 | Temp 98.6°F | Resp 18

## 2017-03-01 DIAGNOSIS — Z5111 Encounter for antineoplastic chemotherapy: Secondary | ICD-10-CM

## 2017-03-01 DIAGNOSIS — C801 Malignant (primary) neoplasm, unspecified: Secondary | ICD-10-CM

## 2017-03-01 DIAGNOSIS — C3431 Malignant neoplasm of lower lobe, right bronchus or lung: Secondary | ICD-10-CM

## 2017-03-01 MED ORDER — SODIUM CHLORIDE 0.9% FLUSH
10.0000 mL | INTRAVENOUS | Status: DC | PRN
  Administered 2017-03-01: 10 mL
  Filled 2017-03-01: qty 10

## 2017-03-01 MED ORDER — PROCHLORPERAZINE MALEATE 10 MG PO TABS
10.0000 mg | ORAL_TABLET | Freq: Once | ORAL | Status: AC
  Administered 2017-03-01: 10 mg via ORAL

## 2017-03-01 MED ORDER — TOPOTECAN HCL CHEMO INJECTION 4 MG
1.5000 mg/m2 | Freq: Once | INTRAVENOUS | Status: AC
  Administered 2017-03-01: 2.6 mg via INTRAVENOUS
  Filled 2017-03-01: qty 2.6

## 2017-03-01 MED ORDER — SODIUM CHLORIDE 0.9 % IV SOLN
Freq: Once | INTRAVENOUS | Status: AC
  Administered 2017-03-01: 12:00:00 via INTRAVENOUS

## 2017-03-01 MED ORDER — HEPARIN SOD (PORK) LOCK FLUSH 100 UNIT/ML IV SOLN
500.0000 [IU] | Freq: Once | INTRAVENOUS | Status: AC | PRN
  Administered 2017-03-01: 500 [IU]

## 2017-03-01 MED ORDER — PROCHLORPERAZINE MALEATE 10 MG PO TABS
ORAL_TABLET | ORAL | Status: AC
  Filled 2017-03-01: qty 1

## 2017-03-01 NOTE — Progress Notes (Signed)
Chemotherapy given today per orders. Patient tolerated it well. No issues. Vitals stable and discharged home from clinic. Follow up as scheduled.

## 2017-03-01 NOTE — Patient Instructions (Signed)
[facility] Discharge Instructions for Patients Receiving Chemotherapy   Beginning January 23rd 2017 lab work for the Cancer Center will be done in the  Main lab at [place] on 1st floor. If you have a lab appointment with the Cancer Center please come in thru the  Main Entrance and check in at the main information desk   Today you received the following chemotherapy agents   To help prevent nausea and vomiting after your treatment, we encourage you to take your nausea medication     If you develop nausea and vomiting, or diarrhea that is not controlled by your medication, call the clinic.  The clinic phone number is [phone]. Office hours are Monday-Friday 8:30am-5:00pm.  BELOW ARE SYMPTOMS THAT SHOULD BE REPORTED IMMEDIATELY:  *FEVER GREATER THAN 101.0 F  *CHILLS WITH OR WITHOUT FEVER  NAUSEA AND VOMITING THAT IS NOT CONTROLLED WITH YOUR NAUSEA MEDICATION  *UNUSUAL SHORTNESS OF BREATH  *UNUSUAL BRUISING OR BLEEDING  TENDERNESS IN MOUTH AND THROAT WITH OR WITHOUT PRESENCE OF ULCERS  *URINARY PROBLEMS  *BOWEL PROBLEMS  UNUSUAL RASH Items with * indicate a potential emergency and should be followed up as soon as possible. If you have an emergency after office hours please contact your primary care physician or go to the nearest emergency department.  Please call the clinic during office hours if you have any questions or concerns.   You may also contact the Patient Navigator at [phone] should you have any questions or need assistance in obtaining follow up care.      Resources For Cancer Patients and their Caregivers ? American Cancer Society: Can assist with transportation, wigs, general needs, runs Look Good Feel Better.        1-888-227-6333 ? Cancer Care: Provides financial assistance, online support groups, medication/co-pay assistance.  1-800-813-HOPE (4673) ? [facility] Assists [place] cancer  patients and their families through emotional , educational and financial support.  336-427-4357 ? [place] DSS Where to apply for food stamps, Medicaid and utility assistance. 336-342-1394 ? [employer]: Transportation to medical appointments. 336-347-2287 ? Social Security Administration: May apply for disability if have a Stage IV cancer. 336-342-7796 1-800-772-1213 ? [place] [facility]ging, Disability and Transit Services: Assists with nutrition, care and transit needs. 336-349-2343         

## 2017-03-01 NOTE — Progress Notes (Unsigned)
Referral to [facility] for 2nd opinion.  Records faxed to [phone].  Office contact is [employer] [phone].  They will contact patient.

## 2017-03-02 ENCOUNTER — Encounter (HOSPITAL_BASED_OUTPATIENT_CLINIC_OR_DEPARTMENT_OTHER): Payer: Managed Care, Other (non HMO)

## 2017-03-02 VITALS — BP 124/80 | HR 70 | Temp 98.0°F | Resp 18

## 2017-03-02 DIAGNOSIS — C3431 Malignant neoplasm of lower lobe, right bronchus or lung: Secondary | ICD-10-CM | POA: Diagnosis not present

## 2017-03-02 DIAGNOSIS — Z5111 Encounter for antineoplastic chemotherapy: Secondary | ICD-10-CM | POA: Diagnosis not present

## 2017-03-02 DIAGNOSIS — C801 Malignant (primary) neoplasm, unspecified: Secondary | ICD-10-CM

## 2017-03-02 MED ORDER — PROCHLORPERAZINE MALEATE 10 MG PO TABS
10.0000 mg | ORAL_TABLET | Freq: Once | ORAL | Status: AC
  Administered 2017-03-02: 10 mg via ORAL

## 2017-03-02 MED ORDER — SODIUM CHLORIDE 0.9% FLUSH
10.0000 mL | INTRAVENOUS | Status: DC | PRN
  Administered 2017-03-02: 10 mL
  Filled 2017-03-02: qty 10

## 2017-03-02 MED ORDER — SODIUM CHLORIDE 0.9 % IV SOLN
Freq: Once | INTRAVENOUS | Status: AC
  Administered 2017-03-02: 12:00:00 via INTRAVENOUS

## 2017-03-02 MED ORDER — PROCHLORPERAZINE MALEATE 10 MG PO TABS
ORAL_TABLET | ORAL | Status: AC
  Filled 2017-03-02: qty 1

## 2017-03-02 MED ORDER — HEPARIN SOD (PORK) LOCK FLUSH 100 UNIT/ML IV SOLN
500.0000 [IU] | Freq: Once | INTRAVENOUS | Status: AC | PRN
  Administered 2017-03-02: 500 [IU]
  Filled 2017-03-02: qty 5

## 2017-03-02 MED ORDER — SODIUM CHLORIDE 0.9 % IV SOLN
1.5000 mg/m2 | Freq: Once | INTRAVENOUS | Status: AC
  Administered 2017-03-02: 2.6 mg via INTRAVENOUS
  Filled 2017-03-02: qty 2.6

## 2017-03-02 NOTE — Progress Notes (Signed)
chemtherapy given today per orders, patient tolerated it well, vitals stable and discharged home ambulatory from clinic.follow up as scheduled.

## 2017-03-02 NOTE — Patient Instructions (Signed)
[facility] Discharge Instructions for Patients Receiving Chemotherapy   Beginning January 23rd 2017 lab work for the [facility] will be done in the  Main lab at [facility] on 1st floor. If you have a lab appointment with the [facility] please come in thru the  Main Entrance and check in at the main information desk   Today you received the following chemotherapy agents   To help prevent nausea and vomiting after your treatment, we encourage you to take your nausea medic    If you develop nausea and vomiting, or diarrhea that is not controlled by your medication, call the clinic.  The clinic phone number is [phone]. Office hours are Monday-Friday 8:30am-5:00pm.  BELOW ARE SYMPTOMS THAT SHOULD BE REPORTED IMMEDIATELY:  *FEVER GREATER THAN 101.0 F  *CHILLS WITH OR WITHOUT FEVER  NAUSEA AND VOMITING THAT IS NOT CONTROLLED WITH YOUR NAUSEA MEDICATION  *UNUSUAL SHORTNESS OF BREATH  *UNUSUAL BRUISING OR BLEEDING  TENDERNESS IN MOUTH AND THROAT WITH OR WITHOUT PRESENCE OF ULCERS  *URINARY PROBLEMS  *BOWEL PROBLEMS  UNUSUAL RASH Items with * indicate a potential emergency and should be followed up as soon as possible. If you have an emergency after office hours please contact your primary care physician or go to the nearest emergency department.  Please call the clinic during office hours if you have any questions or concerns.   You may also contact the Patient Navigator at [phone] should you have any questions or need assistance in obtaining follow up care.      Resources For Cancer Patients and their Caregivers ? American Cancer Society: Can assist with transportation, wigs, general needs, runs Look Good Feel Better.        7828673092 ? Cancer Care: Provides financial assistance, online support groups, medication/co-pay assistance.  1-800-813-HOPE 831-477-5922) ? [facility] Assists [place] cancer patients  and their families through emotional , educational and financial support.  (502)734-9599 ? [place] DSS Where to apply for food stamps, Medicaid and utility assistance. (450)602-2489 ? [employer]: Transportation to medical appointments. (502) 423-1519 ? Social Security Administration: May apply for disability if have a Stage IV cancer. 260-277-0594 (269) 133-0455 ? [employer], Disability and Transit Services: Assists with nutrition, care and transit needs. [phone]

## 2017-03-03 ENCOUNTER — Other Ambulatory Visit (HOSPITAL_COMMUNITY): Payer: Self-pay | Admitting: Oncology

## 2017-03-03 ENCOUNTER — Encounter (HOSPITAL_BASED_OUTPATIENT_CLINIC_OR_DEPARTMENT_OTHER): Payer: Managed Care, Other (non HMO)

## 2017-03-03 VITALS — BP 148/68 | HR 77 | Temp 98.0°F | Resp 16

## 2017-03-03 DIAGNOSIS — Z5111 Encounter for antineoplastic chemotherapy: Secondary | ICD-10-CM | POA: Diagnosis not present

## 2017-03-03 DIAGNOSIS — C3431 Malignant neoplasm of lower lobe, right bronchus or lung: Secondary | ICD-10-CM

## 2017-03-03 DIAGNOSIS — C801 Malignant (primary) neoplasm, unspecified: Secondary | ICD-10-CM

## 2017-03-03 MED ORDER — SODIUM CHLORIDE 0.9% FLUSH
10.0000 mL | INTRAVENOUS | Status: DC | PRN
  Administered 2017-03-03: 10 mL
  Filled 2017-03-03: qty 10

## 2017-03-03 MED ORDER — PROCHLORPERAZINE MALEATE 10 MG PO TABS
10.0000 mg | ORAL_TABLET | Freq: Once | ORAL | Status: AC
  Administered 2017-03-03: 10 mg via ORAL

## 2017-03-03 MED ORDER — TOPOTECAN HCL CHEMO INJECTION 4 MG
1.5000 mg/m2 | Freq: Once | INTRAVENOUS | Status: AC
  Administered 2017-03-03: 2.6 mg via INTRAVENOUS
  Filled 2017-03-03: qty 2.6

## 2017-03-03 MED ORDER — SODIUM CHLORIDE 0.9 % IV SOLN
Freq: Once | INTRAVENOUS | Status: AC
  Administered 2017-03-03: 11:00:00 via INTRAVENOUS

## 2017-03-03 MED ORDER — HEPARIN SOD (PORK) LOCK FLUSH 100 UNIT/ML IV SOLN
500.0000 [IU] | Freq: Once | INTRAVENOUS | Status: AC | PRN
  Administered 2017-03-03: 500 [IU]

## 2017-03-03 MED ORDER — PROCHLORPERAZINE MALEATE 10 MG PO TABS
ORAL_TABLET | ORAL | Status: AC
  Filled 2017-03-03: qty 1

## 2017-03-03 NOTE — Progress Notes (Signed)
Chemotherapy given today per orders. Patient tolerated it well, no issues.vitals stable and discharged home from clinic ambulatory.follow up as scheduled.

## 2017-03-03 NOTE — Patient Instructions (Signed)
[facility] Discharge Instructions for Patients Receiving Chemotherapy   Beginning January 23rd 2017 lab work for the Cancer Center will be done in the  Main lab at [place] on 1st floor. If you have a lab appointment with the Cancer Center please come in thru the  Main Entrance and check in at the main information desk   Today you received the following chemotherapy agents   To help prevent nausea and vomiting after your treatment, we encourage you to take your nausea medication     If you develop nausea and vomiting, or diarrhea that is not controlled by your medication, call the clinic.  The clinic phone number is [phone]. Office hours are Monday-Friday 8:30am-5:00pm.  BELOW ARE SYMPTOMS THAT SHOULD BE REPORTED IMMEDIATELY:  *FEVER GREATER THAN 101.0 F  *CHILLS WITH OR WITHOUT FEVER  NAUSEA AND VOMITING THAT IS NOT CONTROLLED WITH YOUR NAUSEA MEDICATION  *UNUSUAL SHORTNESS OF BREATH  *UNUSUAL BRUISING OR BLEEDING  TENDERNESS IN MOUTH AND THROAT WITH OR WITHOUT PRESENCE OF ULCERS  *URINARY PROBLEMS  *BOWEL PROBLEMS  UNUSUAL RASH Items with * indicate a potential emergency and should be followed up as soon as possible. If you have an emergency after office hours please contact your primary care physician or go to the nearest emergency department.  Please call the clinic during office hours if you have any questions or concerns.   You may also contact the Patient Navigator at [phone] should you have any questions or need assistance in obtaining follow up care.      Resources For Cancer Patients and their Caregivers ? American Cancer Society: Can assist with transportation, wigs, general needs, runs Look Good Feel Better.        1-888-227-6333 ? Cancer Care: Provides financial assistance, online support groups, medication/co-pay assistance.  1-800-813-HOPE (4673) ? [facility] Assists [place] cancer  patients and their families through emotional , educational and financial support.  336-427-4357 ? [place] DSS Where to apply for food stamps, Medicaid and utility assistance. 336-342-1394 ? [employer]: Transportation to medical appointments. 336-347-2287 ? Social Security Administration: May apply for disability if have a Stage IV cancer. 336-342-7796 1-800-772-1213 ? [place] [facility]ging, Disability and Transit Services: Assists with nutrition, care and transit needs. [phone]         

## 2017-03-04 ENCOUNTER — Encounter (HOSPITAL_BASED_OUTPATIENT_CLINIC_OR_DEPARTMENT_OTHER): Payer: Managed Care, Other (non HMO)

## 2017-03-04 VITALS — BP 160/83 | HR 71 | Temp 98.1°F | Resp 16

## 2017-03-04 DIAGNOSIS — C3431 Malignant neoplasm of lower lobe, right bronchus or lung: Secondary | ICD-10-CM

## 2017-03-04 DIAGNOSIS — Z5111 Encounter for antineoplastic chemotherapy: Secondary | ICD-10-CM

## 2017-03-04 DIAGNOSIS — C801 Malignant (primary) neoplasm, unspecified: Secondary | ICD-10-CM

## 2017-03-04 MED ORDER — SODIUM CHLORIDE 0.9% FLUSH
10.0000 mL | INTRAVENOUS | Status: DC | PRN
  Administered 2017-03-04: 10 mL
  Filled 2017-03-04: qty 10

## 2017-03-04 MED ORDER — SODIUM CHLORIDE 0.9 % IV SOLN
Freq: Once | INTRAVENOUS | Status: AC
  Administered 2017-03-04: 12:00:00 via INTRAVENOUS

## 2017-03-04 MED ORDER — HEPARIN SOD (PORK) LOCK FLUSH 100 UNIT/ML IV SOLN
500.0000 [IU] | Freq: Once | INTRAVENOUS | Status: AC | PRN
  Administered 2017-03-04: 500 [IU]
  Filled 2017-03-04: qty 5

## 2017-03-04 MED ORDER — TOPOTECAN HCL CHEMO INJECTION 4 MG
1.5000 mg/m2 | Freq: Once | INTRAVENOUS | Status: AC
  Administered 2017-03-04: 2.6 mg via INTRAVENOUS
  Filled 2017-03-04: qty 2.6

## 2017-03-04 MED ORDER — PROCHLORPERAZINE MALEATE 10 MG PO TABS: 10.0000 mg | ORAL_TABLET | Freq: Once | ORAL | Status: DC

## 2017-03-04 NOTE — Progress Notes (Signed)
Chemotherapy given today per orders. Patient tolerated it well without issues. Vitals stable, discharged home from clinic ambulatory. Follow up as scheduled.

## 2017-03-04 NOTE — Patient Instructions (Signed)
[facility] Discharge Instructions for Patients Receiving Chemotherapy   Beginning January 23rd 2017 lab work for the Cancer Center will be done in the  Main lab at  on 1st floor. If you have a lab appointment with the Cancer Center please come in thru the  Main Entrance and check in at the main information desk   Today you received the following chemotherapy agents   To help prevent nausea and vomiting after your treatment, we encourage you to take your nausea medication     If you develop nausea and vomiting, or diarrhea that is not controlled by your medication, call the clinic.  The clinic phone number is [phone]. Office hours are Monday-Friday 8:30am-5:00pm.  BELOW ARE SYMPTOMS THAT SHOULD BE REPORTED IMMEDIATELY:  *FEVER GREATER THAN 101.0 F  *CHILLS WITH OR WITHOUT FEVER  NAUSEA AND VOMITING THAT IS NOT CONTROLLED WITH YOUR NAUSEA MEDICATION  *UNUSUAL SHORTNESS OF BREATH  *UNUSUAL BRUISING OR BLEEDING  TENDERNESS IN MOUTH AND THROAT WITH OR WITHOUT PRESENCE OF ULCERS  *URINARY PROBLEMS  *BOWEL PROBLEMS  UNUSUAL RASH Items with * indicate a potential emergency and should be followed up as soon as possible. If you have an emergency after office hours please contact your primary care physician or go to the nearest emergency department.  Please call the clinic during office hours if you have any questions or concerns.   You may also contact the Patient Navigator at [phone] should you have any questions or need assistance in obtaining follow up care.      Resources For Cancer Patients and their Caregivers ? American Cancer Society: Can assist with transportation, wigs, general needs, runs Look Good Feel Better.        1-888-227-6333 ? Cancer Care: Provides financial assistance, online support groups, medication/co-pay assistance.  1-800-813-HOPE (4673) ? [facility] Assists [place] cancer  patients and their families through emotional , educational and financial support.  336-427-4357 ? [place] DSS Where to apply for food stamps, Medicaid and utility assistance. 336-342-1394 ? [employer]: Transportation to medical appointments. 336-347-2287 ? Social Security Administration: May apply for disability if have a Stage IV cancer. [phone] 1-800-772-1213 ? [place] [facility]ging, Disability and Transit Services: Assists with nutrition, care and transit needs. [phone]         

## 2017-03-07 ENCOUNTER — Telehealth (HOSPITAL_COMMUNITY): Payer: Self-pay

## 2017-03-07 NOTE — Telephone Encounter (Signed)
24 hour follow up- left message for patient to call us if she has any questions or concerns.

## 2017-03-08 ENCOUNTER — Encounter (HOSPITAL_COMMUNITY)
Admission: RE | Admit: 2017-03-08 | Discharge: 2017-03-08 | Disposition: A | Discharge status: Home / Self Care | Payer: Managed Care, Other (non HMO) | Source: Ambulatory Visit | Attending: Oncology | Admitting: Oncology

## 2017-03-08 ENCOUNTER — Ambulatory Visit (HOSPITAL_COMMUNITY)
Admission: RE | Admit: 2017-03-08 | Discharge: 2017-03-08 | Disposition: A | Discharge status: Home / Self Care | Payer: Managed Care, Other (non HMO) | Source: Ambulatory Visit | Attending: Oncology | Admitting: Oncology

## 2017-03-08 ENCOUNTER — Other Ambulatory Visit (HOSPITAL_COMMUNITY): Payer: Managed Care, Other (non HMO)

## 2017-03-08 ENCOUNTER — Encounter (HOSPITAL_COMMUNITY): Payer: Self-pay

## 2017-03-08 DIAGNOSIS — C801 Malignant (primary) neoplasm, unspecified: Secondary | ICD-10-CM | POA: Diagnosis not present

## 2017-03-08 MED ORDER — TECHNETIUM TC 99M MEDRONATE IV KIT
20.0000 | PACK | Freq: Once | INTRAVENOUS | Status: AC | PRN
  Administered 2017-03-08: 20.5 via INTRAVENOUS

## 2017-03-08 MED ORDER — GADOBENATE DIMEGLUMINE 529 MG/ML IV SOLN
10.0000 mL | Freq: Once | INTRAVENOUS | Status: AC | PRN
  Administered 2017-03-08: 10 mL via INTRAVENOUS

## 2017-03-09 NOTE — Progress Notes (Signed)
[facility], [place] [address]   CLINIC:  Medical Oncology/Hematology  PCP:  [place] [address] [phone]   REASON FOR VISIT:  Follow-up for Recurrent small cell lung cancer   CURRENT THERAPY: Topotecan days 1-5 every 21 days, beginning 02/28/17   BRIEF ONCOLOGIC HISTORY:    Small cell carcinoma ([place])   07/12/2016 Imaging    CTA chest 3.9 x 3.5 cm right lower lobe mass with imaging features most compatible with a primary lung carcinoma.Marked metastatic right hilar and mediastinal adenopathy including a confluent mass of adenopathy in the right hilum, encasing and causing marked narrowing of the central pulmonary arteries on the right with a short segment of occlusion of the right lower lobe bronchus.Mild postobstructive changes in the inferior, medial right lower lobe.Mild changes of COPD       07/16/2016 Procedure    Endoscopic bronchoscopy with transbronchial biopsy of #7 nodes and biopsy of RLL lung mass      07/16/2016 Pathology Results    Lung, biopsy, Right Lower Lobe - SMALL CELL CARCINOMA.       07/21/2016 Imaging    MRI brain Negative MRI of the brain. No evidence for metastatic disease to the brain or meninges.      07/26/2016 PET scan    Markedly hypermetabolic right lower lobe lesion with hypermetabolic metastatic disease in the right hilum and mediastinum. 2. Several scattered hypermetabolic ground-glass opacities in the right upper lobe. FDG accumulation within these nodules is higher than typically seen for infectious/inflammatory etiology although this remains within the differential. Atypical appearance of metastatic spread would also be a consideration. 3. Coronary artery atherosclerosis. 4. **An incidental finding of potential clinical significance has been found. 3.8 cm abdominal aortic aneurysm       07/27/2016 - 11/11/2016 Chemotherapy    Cisplatin/Etoposide x 6 cycles      11/09/2016 Treatment Plan Change    Patient declined/refused whole brain radiation.      11/23/2016 Imaging    CT chest with No new or progressive findings in the chest. 2. Further interval decrease in size of mediastinal lymphadenopathy. 3. Only trace scarring visible in the right lower lobe, at the location of the previous right lower lobe mass. 4. Coronary artery and thoracoabdominal aortic atherosclerosis      02/21/2017 Imaging    CT CAP- 1. Unfortunately the patient has had significant recurrence with marked mediastinal and right infrahilar adenopathy as well as suspected masses in the collapsed right lower lobe. The confluent adenopathy completely occlude the right lower lobe and right middle lobe bronchi, although there is some air trapping in the right middle lobe ; the right lower lobe is completely collapsed around several right lower lobe masses. 2. There is some speckled density in the left anterior sixth rib which is not entirely specific but which is concerning for early osseous metastatic disease given that it was not present on 11/23/2016. Consider a bone scan to further assess the skeleton for other occult bony spread. 3. Infrarenal abdominal aortic aneurysm 4.2 cm in diameter, increased from the prior measurement of 3.8 cm on 07/26/2016. Although this is only minor increase, it has occurred in a 7 month period. The patient has a previous existing relationship of cardiothoracic surgery and surveillance of this aneurysm by surgery is appropriate. 4. Other imaging findings of potential clinical significance: Coronary, aortic arch, and branch vessel atherosclerotic vascular disease. Mild lumbar spondylosis and degenerative disc  disease.      02/22/2017 Relapse/Recurrence    Last Treatment Date: 11/11/2016 Recent Lab Values: Recent Lab Values: N/A      02/28/2017 -  Chemotherapy    The patient had palonosetron (ALOXI) injection 0.25 mg, 0.25 mg, Intravenous,   Once, 6 of 6 cycles  pegfilgrastim (NEULASTA ONPRO KIT) injection 6 mg, 6 mg, Subcutaneous,  Once, 1 of 1 cycle  pegfilgrastim (NEULASTA ONPRO KIT) injection 6 mg, 6 mg, Subcutaneous, Once, 5 of 5 cycles  CISplatin (PLATINOL) 138 mg in sodium chloride 0.9 % 500 mL chemo infusion, 80 mg/m2 = 138 mg, Intravenous,  Once, 6 of 6 cycles  etoposide (VEPESID) 170 mg in sodium chloride 0.9 % 500 mL chemo infusion, 100 mg/m2 = 170 mg, Intravenous,  Once, 6 of 6 cycles  fosaprepitant (EMEND) 150 mg, dexamethasone (DECADRON) 12 mg in sodium chloride 0.9 % 145 mL IVPB, , Intravenous,  Once, 5 of 5 cycles  topotecan (HYCAMTIN) 2.6 mg in sodium chloride 0.9 % 100 mL chemo infusion, 1.5 mg/m2 = 2.6 mg, Intravenous,  Once, 1 of 4 cycles  for chemotherapy treatment.        03/08/2017 Imaging    MRI brain- 1. No evidence of metastatic disease. 2. Intermittently motion degraded.      03/08/2017 Imaging    Bone scan: IMPRESSION: No evidence skeletal metastasis by bone scintigraphy.        INTERVAL HISTORY:  Ms. Shinault 56 y.o. female returns for routine follow-up after completing cycle #1 of second-line Topotecan chemotherapy for recurrent small cell lung cancer.    She tells me that she feels great. She did have some nausea and vomiting since finishing treatment on 03/04/17; vomiting usually occurs first thing in the morning. Emesis is "clear and foamy." Denies any fever, chills, or night sweats.  Denies any frank bleeding; no blood in her stools/urine. Bowels are moving well. She continues to smoke about 1 ppd; she and her daughter would like recommendations regarding smoking cessation.   Since her last visit, she did undergo MRI brain and bone scan for complete restaging evaluation given recurrent disease.   She is here today with her daughter. They have an appointment for a 2nd opinion for treatment recommendations at [facility] later today.     REVIEW OF SYSTEMS:  Review of Systems  Constitutional:  Negative.  Negative for chills and fever.  HENT:  Negative.  Negative for mouth sores.   Eyes: Negative.   Respiratory: Positive for cough (chronic cough; no worse). Negative for shortness of breath.   Cardiovascular: Negative.   Gastrointestinal: Positive for nausea and vomiting.  Endocrine: Negative.   Genitourinary: Negative.  Negative for dysuria, hematuria and vaginal bleeding.   Skin: Negative.   Neurological: Negative.  Negative for dizziness and headaches.  Hematological: Negative.   Psychiatric/Behavioral: Negative.      PAST MEDICAL/SURGICAL HISTORY:  Past Medical History:  Diagnosis Date  . Elevated cholesterol   . GERD (gastroesophageal reflux disease)   . GI bleed   . Hypertension   . 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 Small cell carcinoma ([place]) 07/21/2016   Past Surgical History:  Procedure Laterality Date  . ABDOMINAL HYSTERECTOMY     partial  . CESAREAN SECTION    . COLONOSCOPY    . PORTACATH PLACEMENT Left 08/04/2016   Procedure: INSERTION PORT-A-CATH LEFT SUBCLAVIAN;  Surgeon: Aviva Signs, MD;  Location: [facility];  Service: General;  Laterality: Left;  . TONSILLECTOMY    .  VIDEO BRONCHOSCOPY WITH ENDOBRONCHIAL ULTRASOUND N/A 07/16/2016   Procedure: VIDEO BRONCHOSCOPY WITH ENDOBRONCHIAL ULTRASOUND with ultrasound transbronchial biopsy of node #7 and right lower lobe lesion;  Surgeon: Grace Isaac, MD;  Location: [facility];  Service: Thoracic;  Laterality: N/A;     SOCIAL HISTORY:  Social History   Social History  . Marital status: Married    Spouse name: N/A  . Number of children: 4  . Years of education: N/A   Occupational History  . [employer]   Social History Main Topics  . Smoking status: Current Every Day Smoker    Packs/day: 0.50    Years: 35.00    Types: Cigarettes  . Smokeless tobacco: Never Used  . Alcohol use Yes     Comment: occasionally, Couple drinks per mo  . Drug use: Yes    Frequency: 1.0 time  per week    Types: Marijuana     Comment: weekly  . Sexual activity: Yes    Birth control/ protection: Surgical     Comment: married   Other Topics Concern  . Not on file   Social History Narrative   Lives w/ husband    FAMILY HISTORY:  Family History  Problem Relation Age of Onset  . Heart disease Mother   . Heart disease Father   . COPD Sister   . HIV Brother     CURRENT MEDICATIONS:  Outpatient Encounter Prescriptions as of 03/10/2017  Medication Sig  . amLODipine (NORVASC) 5 MG tablet Take 1 tablet (5 mg total) by mouth daily.  . DULoxetine (CYMBALTA) 60 MG capsule Take 1 capsule (60 mg total) by mouth daily.  Marland Kitchen gabapentin (NEURONTIN) 300 MG capsule Take 1 capsule (300 mg total) by mouth 4 (four) times daily.  Marland Kitchen lidocaine-prilocaine (EMLA) cream Apply to affected area once  . loratadine (CLARITIN) 10 MG tablet TAKE 1 TABLET BY MOUTH EVERY DAY  . ondansetron (ZOFRAN) 8 MG tablet Take 1 tablet (8 mg total) by mouth 2 (two) times daily as needed. Start on the third day after cisplatin chemotherapy.  . potassium chloride SA (K-DUR,KLOR-CON) 20 MEQ tablet Take 1 tablet (20 mEq total) by mouth 2 (two) times daily.  . prochlorperazine (COMPAZINE) 10 MG tablet Take 1 tablet (10 mg total) by mouth every 6 (six) hours as needed (Nausea or vomiting).  . topotecan in sodium chloride 0.9 % 100 mL Inject into the vein once. Days 1-5 every 21 days  . zolpidem (AMBIEN) 10 MG tablet Take 1 tablet (10 mg total) by mouth at bedtime as needed for sleep.   Facility-Administered Encounter Medications as of 03/10/2017  Medication  . potassium chloride SA (K-DUR,KLOR-CON) CR tablet 60 mEq    ALLERGIES:  No Known Allergies   PHYSICAL EXAM:  ECOG Performance status: 1 - Symptomatic, but independent.   Vitals:   03/10/17 1023  BP: 131/87  Pulse: 72  Resp: 16  Temp: 98 F (36.7 C)   Filed Weights   03/10/17 1023  Weight: 139 lb (63 kg)    Physical Exam  Constitutional: She is  oriented to person, place, and time.  Chronically-ill appearing female in no acute distress   HENT:  Head: Normocephalic.  Mouth/Throat: Oropharynx is clear and moist. No oropharyngeal exudate.  Eyes: Conjunctivae are normal. Pupils are equal, round, and reactive to light. No scleral icterus.  Neck: Normal range of motion. Neck supple.  Cardiovascular: Normal rate, regular rhythm and normal heart sounds.   Pulmonary/Chest: Effort normal. No respiratory  distress.  Breath sounds diminished bilateral upper lobes.   Abdominal: Soft. Bowel sounds are normal. There is no tenderness.  Musculoskeletal: Normal range of motion. She exhibits no edema.  Lymphadenopathy:    She has no cervical adenopathy.       Right: No supraclavicular adenopathy present.       Left: No supraclavicular adenopathy present.  Neurological: She is alert and oriented to person, place, and time. No cranial nerve deficit. Gait normal.  Skin: Skin is warm and dry. No rash noted.  Psychiatric: Mood, memory, affect and judgment normal.  Nursing note and vitals reviewed.    LABORATORY DATA:  I have reviewed the labs as listed.  CBC    Component Value Date/Time   WBC 0.8 (LL) 03/10/2017 0946   RBC 3.95 03/10/2017 0946   HGB 12.0 03/10/2017 0946   HCT 34.5 (L) 03/10/2017 0946   PLT 51 (L) 03/10/2017 0946   MCV 87.3 03/10/2017 0946   MCH 30.4 03/10/2017 0946   MCHC 34.8 03/10/2017 0946   RDW 12.4 03/10/2017 0946   LYMPHSABS 0.5 (L) 03/10/2017 0946   MONOABS 0.0 (L) 03/10/2017 0946   EOSABS 0.0 03/10/2017 0946   BASOSABS 0.0 03/10/2017 0946   CMP Latest Ref Rng & Units 03/10/2017 02/28/2017 02/17/2017  Glucose 65 - 99 mg/dL 129(H) 152(H) 93  BUN 6 - 20 mg/dL 16 18 26(H)  Creatinine 0.44 - 1.00 mg/dL 0.69 0.70 0.98  Sodium 135 - 145 mmol/L 132(L) 133(L) 134(L)  Potassium 3.5 - 5.1 mmol/L 3.3(L) 3.1(L) 3.4(L)  Chloride 101 - 111 mmol/L 95(L) 98(L) 99(L)  CO2 22 - 32 mmol/L '27 26 24  '$ Calcium 8.9 - 10.3 mg/dL 9.1 9.2  9.2  Total Protein 6.5 - 8.1 g/dL 7.1 7.2 6.9  Total Bilirubin 0.3 - 1.2 mg/dL 0.9 0.8 0.3  Alkaline Phos 38 - 126 U/L 86 73 77  AST 15 - 41 U/L 27 28 32  ALT 14 - 54 U/L '28 21 27    '$ PENDING LABS:    DIAGNOSTIC IMAGING:  The following imaging results reviewed and discussed with patient/family.  MRI brain: 03/08/17    Bone scan: 03/08/17     PATHOLOGY:  (R)LL lung biopsy: 07/16/16       ASSESSMENT & PLAN:   Recurrent small cell lung cancer:  -Relapse of disease <6 months after prior Cisplatin-based chemotherapy. We reviewed staging from [facility] and given recurrent disease is now extensive stage disease.  Restaging CT imaging suggests that disease is confined to chest with ? lesion to rib. Bone scan done for further evaluation and negative. MRI brain repeated and was negative.  -Second-line single-agent Topotecan started on 02/28/17. We discussed that there is modest chance of additional remission, but given the aggressive biology of SCLC, it may be difficult to have sustained remission given the quick relapse of disease after initial Cisplatin-based chemotherapy.  -Nadir/toxicity check today; alert WBC 0.8 today. Reinforced neutropenia precautions.  -They are going to [facility] later today for 2nd opinion. I shared our support in patients seeking second opinions at academic institutions. Of course, we follow NCCN Guidelines and make treatment decisions based on these guidelines and each individual patient's clinical decision. We discussed that [facility] may have clinical trial options for her as well.   -Return to cancer center on 03/21/17 to see Kirby Crigler, PA-C and for consideration for cycle #2 chemotherapy.   Neutropenia:  -Secondary to chemotherapy. Added OnPro to subsequent cycles of chemotherapy.  -Discussed with Dr. Talbert Cage. Inquired about  giving dose of Neupogen today, but per Dr. Laverle Patter opinion it is not necessary at this time.  -Will bring her back in 1 week to recheck her labs to  ensure adequate count recovery before next cycle of chemotherapy.  -Face masks given to patient; reinforced importance of neutropenic precautions and to report to ED with fever/chills.   Tobacco use disorder:  -We discussed the pathophysiology of nicotine dependence and different strategies to stop smoking. The gold standard of tobacco cessation is nicotine replacement (with nicotine patches & gum/lozenges) or Varenicline (Chantix). She tried Chantix in the past, but it was not effective.  We discussed that the typical craving/urge to smoke lasts about 3-5 minutes; her biggest trigger(s) to use cigarettes is in the car.  Encouraged her to set some new "rules" that do not allow her to smoke in the car; instead she could use a piece of nicotine gum or a lozenge when she has a craving. I gave her instructions on how to use these nicotine replacement products.  She will need continued support with regards to smoking cessation.  Ms.Creson  understands that data suggests that "cold [place]" is the least effective way to stop using tobacco products.  Having a clear "quit plan" is much more effective and requires a step-wise approach with continued support from a tobacco treatment specialist.  I encouraged her to reach out to me if she has further questions or interest in her continued cessation efforts.  Greater than 10 minutes was spent in smoking cessation counseling with this patient.    -Recommended 21 mg nicotine patch with 4 mg lozenge. Can use up to 8 lozenges per day.     Dispo:  -Return next week for lab only to recheck blood counts.  -Then, return to cancer center as directed on 03/21/17 for consideration of next cycle chemotherapy.    All questions were answered to patient's stated satisfaction. Encouraged patient to call with any new concerns or questions before her next visit to the cancer center and we can certain see her sooner, if needed.    Plan of care discussed with Dr. Talbert Cage, who agrees with  the above aforementioned.     Orders placed this encounter:  Orders Placed This Encounter  Procedures  . CBC with Differential/Platelet      Mike Craze, NP [place] [phone]

## 2017-03-10 ENCOUNTER — Encounter (HOSPITAL_COMMUNITY): Payer: Self-pay | Admitting: Adult Health

## 2017-03-10 ENCOUNTER — Encounter (HOSPITAL_COMMUNITY): Payer: Managed Care, Other (non HMO)

## 2017-03-10 ENCOUNTER — Encounter (HOSPITAL_BASED_OUTPATIENT_CLINIC_OR_DEPARTMENT_OTHER): Payer: Managed Care, Other (non HMO) | Admitting: Adult Health

## 2017-03-10 VITALS — BP 131/87 | HR 72 | Temp 98.0°F | Resp 16 | Ht 65.0 in | Wt 139.0 lb

## 2017-03-10 DIAGNOSIS — D701 Agranulocytosis secondary to cancer chemotherapy: Secondary | ICD-10-CM

## 2017-03-10 DIAGNOSIS — C3431 Malignant neoplasm of lower lobe, right bronchus or lung: Secondary | ICD-10-CM

## 2017-03-10 DIAGNOSIS — C801 Malignant (primary) neoplasm, unspecified: Secondary | ICD-10-CM

## 2017-03-10 DIAGNOSIS — Z720 Tobacco use: Secondary | ICD-10-CM

## 2017-03-10 DIAGNOSIS — T451X5A Adverse effect of antineoplastic and immunosuppressive drugs, initial encounter: Secondary | ICD-10-CM

## 2017-03-10 LAB — COMPREHENSIVE METABOLIC PANEL
ALBUMIN: 4.2 g/dL (ref 3.5–5.0)
ALK PHOS: 86 U/L (ref 38–126)
ALT: 28 U/L (ref 14–54)
AST: 27 U/L (ref 15–41)
Anion gap: 10 (ref 5–15)
BUN: 16 mg/dL (ref 6–20)
CALCIUM: 9.1 mg/dL (ref 8.9–10.3)
CHLORIDE: 95 mmol/L — AB (ref 101–111)
CO2: 27 mmol/L (ref 22–32)
CREATININE: 0.69 mg/dL (ref 0.44–1.00)
GFR calc non Af Amer: >60 mL/min (ref >60)
Glucose, Bld: 129 mg/dL — ABNORMAL HIGH (ref 65–99)
Potassium: 3.3 mmol/L — ABNORMAL LOW (ref 3.5–5.1)
SODIUM: 132 mmol/L — AB (ref 135–145)
Total Bilirubin: 0.9 mg/dL (ref 0.3–1.2)
Total Protein: 7.1 g/dL (ref 6.5–8.1)

## 2017-03-10 LAB — CBC WITH DIFFERENTIAL/PLATELET
Basophils Absolute: 0 10*3/uL (ref 0.0–0.1)
Basophils Relative: 0 %
EOS ABS: 0 10*3/uL (ref 0.0–0.7)
EOS PCT: 0 %
HCT: 34.5 % — ABNORMAL LOW (ref 36.0–46.0)
HEMOGLOBIN: 12 g/dL (ref 12.0–15.0)
LYMPHS ABS: 0.5 10*3/uL — AB (ref 0.7–4.0)
Lymphocytes Relative: 68 %
MCH: 30.4 pg (ref 26.0–34.0)
MCHC: 34.8 g/dL (ref 30.0–36.0)
MCV: 87.3 fL (ref 78.0–100.0)
Monocytes Absolute: 0 10*3/uL — ABNORMAL LOW (ref 0.1–1.0)
Monocytes Relative: 4 %
NEUTROS PCT: 28 %
Neutro Abs: 0.2 10*3/uL — ABNORMAL LOW (ref 1.7–7.7)
PLATELETS: 51 10*3/uL — AB (ref 150–400)
RBC: 3.95 MIL/uL (ref 3.87–5.11)
RDW: 12.4 % (ref 11.5–15.5)
WBC: 0.8 10*3/uL — AB (ref 4.0–10.5)

## 2017-03-10 NOTE — Patient Instructions (Signed)
[facility] at [facility] Discharge Instructions  RECOMMENDATIONS MADE BY THE CONSULTANT AND ANY TEST RESULTS WILL BE SENT TO YOUR REFERRING PHYSICIAN.  You were seen today by Gretchen Dawson NP. Return as scheduled.   Thank you for choosing [facility] at [facility] to provide your oncology and hematology care.  To afford each patient quality time with our provider, please arrive at least 15 minutes before your scheduled appointment time.    If you have a lab appointment with the Cancer Center please come in thru the  Main Entrance and check in at the main information desk  You need to re-schedule your appointment should you arrive 10 or more minutes late.  We strive to give you quality time with our providers, and arriving late affects you and other patients whose appointments are after yours.  Also, if you no show three or more times for appointments you may be dismissed from the clinic at the providers discretion.     Again, thank you for choosing [facility].  Our hope is that these requests will decrease the amount of time that you wait before being seen by our physicians.       _____________________________________________________________  Should you have questions after your visit to [facility], please contact our office at [phone] between the hours of 8:30 a.m. and 4:30 p.m.  Voicemails left after 4:30 p.m. will not be returned until the following business day.  For prescription refill requests, have your pharmacy contact our office.       Resources For Cancer Patients and their Caregivers ? American Cancer Society: Can assist with transportation, wigs, general needs, runs Look Good Feel Better.        1-888-227-6333 ? Cancer Care: Provides financial assistance, online support groups, medication/co-pay assistance.  1-800-813-HOPE (4673) ? [facility] Assists [place]  cancer patients and their families through emotional , educational and financial support.  336-427-4357 ? [place] DSS Where to apply for food stamps, Medicaid and utility assistance. 336-342-1394 ? [facility]: Transportation to medical appointments. 336-347-2287 ? Social Security Administration: May apply for disability if have a Stage IV cancer. 336-342-7796 1-800-772-1213 ? [place] Aging, Disability and Transit Services: Assists with nutrition, care and transit needs. 336-349-2343  Cancer Center Support Programs: @10RELATIVEDAYS@ > Cancer Support Group  2nd Tuesday of the month 1pm-2pm, Journey Room  > Creative Journey  3rd Tuesday of the month 1130am-1pm, Journey Room  > Look Good Feel Better  1st Wednesday of the month 10am-12 noon, Journey Room (Call American Cancer Society to register 1-800-395-5775)    

## 2017-03-10 NOTE — Progress Notes (Signed)
CRITICAL VALUE ALERT Critical value received:  WBC-0.79 Date of notification:  03/10/17 Time of notification: 2536 Critical value read back:  Yes.   Nurse who received alert:  M.Rhyan Radler, LPN MD notified (1st page):  [phone]- G.Renato Battles, NP

## 2017-03-11 ENCOUNTER — Encounter (HOSPITAL_COMMUNITY): Payer: Managed Care, Other (non HMO)

## 2017-03-17 ENCOUNTER — Telehealth (HOSPITAL_COMMUNITY): Payer: Self-pay

## 2017-03-17 ENCOUNTER — Encounter (HOSPITAL_COMMUNITY): Payer: Managed Care, Other (non HMO) | Attending: Oncology

## 2017-03-17 DIAGNOSIS — E871 Hypo-osmolality and hyponatremia: Secondary | ICD-10-CM | POA: Diagnosis not present

## 2017-03-17 DIAGNOSIS — C801 Malignant (primary) neoplasm, unspecified: Secondary | ICD-10-CM | POA: Insufficient documentation

## 2017-03-17 DIAGNOSIS — T451X5A Adverse effect of antineoplastic and immunosuppressive drugs, initial encounter: Secondary | ICD-10-CM

## 2017-03-17 DIAGNOSIS — D701 Agranulocytosis secondary to cancer chemotherapy: Secondary | ICD-10-CM

## 2017-03-17 DIAGNOSIS — D649 Anemia, unspecified: Secondary | ICD-10-CM | POA: Diagnosis not present

## 2017-03-17 LAB — CBC WITH DIFFERENTIAL/PLATELET
Basophils Absolute: 0 10*3/uL (ref 0.0–0.1)
Basophils Relative: 1 %
Eosinophils Absolute: 0 10*3/uL (ref 0.0–0.7)
Eosinophils Relative: 1 %
HCT: 35.2 % — ABNORMAL LOW (ref 36.0–46.0)
HEMOGLOBIN: 12.2 g/dL (ref 12.0–15.0)
LYMPHS ABS: 1 10*3/uL (ref 0.7–4.0)
LYMPHS PCT: 51 %
MCH: 30.5 pg (ref 26.0–34.0)
MCHC: 34.7 g/dL (ref 30.0–36.0)
MCV: 88 fL (ref 78.0–100.0)
MONOS PCT: 9 %
Monocytes Absolute: 0.2 10*3/uL (ref 0.1–1.0)
NEUTROS PCT: 39 %
Neutro Abs: 0.8 10*3/uL — ABNORMAL LOW (ref 1.7–7.7)
Platelets: 52 10*3/uL — ABNORMAL LOW (ref 150–400)
RBC: 4 MIL/uL (ref 3.87–5.11)
RDW: 12.6 % (ref 11.5–15.5)
WBC: 2 10*3/uL — AB (ref 4.0–10.5)

## 2017-03-17 NOTE — Telephone Encounter (Signed)
Notified patient about results of lab work. She was pleased that her counts were coming up.

## 2017-03-21 ENCOUNTER — Ambulatory Visit (HOSPITAL_COMMUNITY): Payer: Managed Care, Other (non HMO) | Admitting: Oncology

## 2017-03-21 ENCOUNTER — Ambulatory Visit (HOSPITAL_COMMUNITY): Payer: Managed Care, Other (non HMO)

## 2017-03-21 ENCOUNTER — Encounter (HOSPITAL_BASED_OUTPATIENT_CLINIC_OR_DEPARTMENT_OTHER): Payer: Managed Care, Other (non HMO) | Admitting: Oncology

## 2017-03-21 ENCOUNTER — Encounter (HOSPITAL_COMMUNITY): Payer: Managed Care, Other (non HMO)

## 2017-03-21 ENCOUNTER — Encounter (HOSPITAL_BASED_OUTPATIENT_CLINIC_OR_DEPARTMENT_OTHER): Payer: Managed Care, Other (non HMO)

## 2017-03-21 ENCOUNTER — Encounter (HOSPITAL_COMMUNITY): Payer: Self-pay | Admitting: Oncology

## 2017-03-21 VITALS — BP 130/75 | HR 83 | Temp 98.3°F | Resp 16 | Ht 65.0 in | Wt 139.6 lb

## 2017-03-21 VITALS — BP 128/88 | HR 69 | Temp 98.6°F | Resp 18

## 2017-03-21 DIAGNOSIS — C3431 Malignant neoplasm of lower lobe, right bronchus or lung: Secondary | ICD-10-CM

## 2017-03-21 DIAGNOSIS — C801 Malignant (primary) neoplasm, unspecified: Secondary | ICD-10-CM

## 2017-03-21 DIAGNOSIS — E876 Hypokalemia: Secondary | ICD-10-CM | POA: Diagnosis not present

## 2017-03-21 DIAGNOSIS — Z5111 Encounter for antineoplastic chemotherapy: Secondary | ICD-10-CM

## 2017-03-21 DIAGNOSIS — G620 Drug-induced polyneuropathy: Secondary | ICD-10-CM

## 2017-03-21 DIAGNOSIS — T451X5A Adverse effect of antineoplastic and immunosuppressive drugs, initial encounter: Secondary | ICD-10-CM

## 2017-03-21 DIAGNOSIS — R110 Nausea: Secondary | ICD-10-CM | POA: Diagnosis not present

## 2017-03-21 DIAGNOSIS — Z7189 Other specified counseling: Secondary | ICD-10-CM

## 2017-03-21 HISTORY — DX: Other specified counseling: Z71.89

## 2017-03-21 LAB — COMPREHENSIVE METABOLIC PANEL
ALBUMIN: 4.2 g/dL (ref 3.5–5.0)
ALT: 30 U/L (ref 14–54)
AST: 29 U/L (ref 15–41)
Alkaline Phosphatase: 93 U/L (ref 38–126)
Anion gap: 9 (ref 5–15)
BILIRUBIN TOTAL: 0.3 mg/dL (ref 0.3–1.2)
BUN: 21 mg/dL — ABNORMAL HIGH (ref 6–20)
CO2: 27 mmol/L (ref 22–32)
CREATININE: 0.9 mg/dL (ref 0.44–1.00)
Calcium: 9.2 mg/dL (ref 8.9–10.3)
Chloride: 98 mmol/L — ABNORMAL LOW (ref 101–111)
GFR calc Af Amer: >60 mL/min (ref >60)
GFR calc non Af Amer: >60 mL/min (ref >60)
Glucose, Bld: 146 mg/dL — ABNORMAL HIGH (ref 65–99)
POTASSIUM: 3.2 mmol/L — AB (ref 3.5–5.1)
Sodium: 134 mmol/L — ABNORMAL LOW (ref 135–145)
TOTAL PROTEIN: 7 g/dL (ref 6.5–8.1)

## 2017-03-21 LAB — CBC WITH DIFFERENTIAL/PLATELET
BASOS ABS: 0 10*3/uL (ref 0.0–0.1)
BASOS PCT: 0 %
Eosinophils Absolute: 0 10*3/uL (ref 0.0–0.7)
Eosinophils Relative: 0 %
HEMATOCRIT: 34.6 % — AB (ref 36.0–46.0)
Hemoglobin: 11.9 g/dL — ABNORMAL LOW (ref 12.0–15.0)
Lymphocytes Relative: 34 %
Lymphs Abs: 0.8 10*3/uL (ref 0.7–4.0)
MCH: 30.7 pg (ref 26.0–34.0)
MCHC: 34.4 g/dL (ref 30.0–36.0)
MCV: 89.4 fL (ref 78.0–100.0)
MONO ABS: 0.2 10*3/uL (ref 0.1–1.0)
Monocytes Relative: 9 %
NEUTROS ABS: 1.3 10*3/uL — AB (ref 1.7–7.7)
NEUTROS PCT: 57 %
Platelets: 159 10*3/uL (ref 150–400)
RBC: 3.87 MIL/uL (ref 3.87–5.11)
RDW: 13.9 % (ref 11.5–15.5)
WBC: 2.4 10*3/uL — ABNORMAL LOW (ref 4.0–10.5)

## 2017-03-21 LAB — MAGNESIUM: MAGNESIUM: 2.1 mg/dL (ref 1.7–2.4)

## 2017-03-21 MED ORDER — HEPARIN SOD (PORK) LOCK FLUSH 100 UNIT/ML IV SOLN
500.0000 [IU] | Freq: Once | INTRAVENOUS | Status: AC | PRN
  Administered 2017-03-21: 500 [IU]
  Filled 2017-03-21: qty 5

## 2017-03-21 MED ORDER — POTASSIUM CHLORIDE CRYS ER 20 MEQ PO TBCR: 40.0000 meq | EXTENDED_RELEASE_TABLET | Freq: Every day | ORAL | 0 refills | Status: DC

## 2017-03-21 MED ORDER — SODIUM CHLORIDE 0.9 % IV SOLN
Freq: Once | INTRAVENOUS | Status: AC
  Administered 2017-03-21: 14:00:00 via INTRAVENOUS

## 2017-03-21 MED ORDER — POTASSIUM CHLORIDE 20 MEQ/15ML (10%) PO SOLN
40.0000 meq | Freq: Every day | ORAL | Status: DC
  Administered 2017-03-21: 40 meq via ORAL
  Filled 2017-03-21 (×2): qty 30

## 2017-03-21 MED ORDER — PROCHLORPERAZINE MALEATE 10 MG PO TABS
10.0000 mg | ORAL_TABLET | Freq: Once | ORAL | Status: AC
  Administered 2017-03-21: 10 mg via ORAL

## 2017-03-21 MED ORDER — SODIUM CHLORIDE 0.9 % IV SOLN
1.5000 mg/m2 | Freq: Once | INTRAVENOUS | Status: AC
  Administered 2017-03-21: 2.6 mg via INTRAVENOUS
  Filled 2017-03-21: qty 2.6

## 2017-03-21 MED ORDER — PROCHLORPERAZINE MALEATE 10 MG PO TABS
ORAL_TABLET | ORAL | Status: AC
  Filled 2017-03-21: qty 1

## 2017-03-21 NOTE — Progress Notes (Signed)
Practice, [facility] [place] [address]  Small cell carcinoma [facility]) - Plan: CT Chest W Contrast, CT Abdomen Pelvis W Contrast  Hypokalemia - Plan: potassium chloride SA (K-DUR,KLOR-CON) 20 MEQ tablet, potassium chloride 20 MEQ/15ML (10%) solution 40 mEq  Goals of care, counseling/discussion  Peripheral neuropathy due to chemotherapy [facility])  CURRENT THERAPY: Salvage Topotecan therapy beginning on 02/28/2017  INTERVAL HISTORY: Lydia Moore 56 y.o. 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  S/P 6 cycles of Cisplatin/Etoposide (07/27/2016- 11/11/2016) with curative intent.  She was evaluated by radiation oncology for involved field XRT during chemotherapy, but due to tumor burden, she was deemed not an XRT candidate.  She refused whole brain XRT in the prophylactic setting following systemic chemotherapy.  Short interval relapse of disease <6 months on restaging CT imaging on 02/21/2017 leading to salvage treatment with Topotecan for recurrent/extensive stage SCLC.  S/P second opinion at [facility] with Dr. Lissa Morales on 03/10/2017.    Small cell carcinoma ([place])   07/12/2016 Imaging    CTA chest 3.9 x 3.5 cm right lower lobe mass with imaging features most compatible with a primary lung carcinoma.Marked metastatic right hilar and mediastinal adenopathy including a confluent mass of adenopathy in the right hilum, encasing and causing marked narrowing of the central pulmonary arteries on the right with a short segment of occlusion of the right lower lobe bronchus.Mild postobstructive changes in the inferior, medial right lower lobe.Mild changes of COPD       07/16/2016 Procedure    Endoscopic bronchoscopy with transbronchial biopsy of #7 nodes and biopsy of RLL lung mass      07/16/2016 Pathology Results    Lung, biopsy, Right Lower Lobe - SMALL CELL CARCINOMA.       07/21/2016 Imaging    MRI brain Negative MRI of the brain. No evidence for metastatic disease to the brain or meninges.      07/26/2016 PET scan    Markedly hypermetabolic right lower lobe lesion with hypermetabolic metastatic disease in the right hilum and mediastinum. 2. Several scattered hypermetabolic ground-glass opacities in the right upper lobe. FDG accumulation within these nodules is higher than typically seen for infectious/inflammatory etiology although this remains within the differential. Atypical appearance of metastatic spread would also be a consideration. 3. Coronary artery atherosclerosis. 4. **An incidental finding of potential clinical significance has been found. 3.8 cm abdominal aortic aneurysm       07/27/2016 - 11/11/2016 Chemotherapy    Cisplatin/Etoposide x 6 cycles      11/09/2016 Treatment Plan Change    Patient declined/refused whole brain radiation.      11/23/2016 Imaging    CT chest with No new or progressive findings in the chest. 2. Further interval decrease in size of mediastinal lymphadenopathy. 3. Only trace scarring visible in the right lower lobe, at the location of the previous right lower lobe mass. 4. Coronary artery and thoracoabdominal aortic atherosclerosis      02/21/2017 Imaging    CT CAP- 1. Unfortunately the patient has had significant recurrence with marked mediastinal and right infrahilar adenopathy as well as suspected masses in the collapsed right lower lobe. The confluent adenopathy completely occlude the right lower lobe and right middle lobe bronchi, although there is some air trapping in the right middle lobe ; the right lower lobe is completely collapsed around several right lower lobe  masses. 2. There is some speckled density in the left anterior sixth rib which is not entirely specific but which is concerning for early osseous metastatic disease given that it was not present on 11/23/2016. Consider a bone scan to further assess the  skeleton for other occult bony spread. 3. Infrarenal abdominal aortic aneurysm 4.2 cm in diameter, increased from the prior measurement of 3.8 cm on 07/26/2016. Although this is only minor increase, it has occurred in a 7 month period. The patient has a previous existing relationship of cardiothoracic surgery and surveillance of this aneurysm by surgery is appropriate. 4. Other imaging findings of potential clinical significance: Coronary, aortic arch, and branch vessel atherosclerotic vascular disease. Mild lumbar spondylosis and degenerative disc disease.      02/22/2017 Relapse/Recurrence    Last Treatment Date: 11/11/2016 Recent Lab Values: Recent Lab Values: N/A      02/28/2017 -  Chemotherapy    Topotecan       03/08/2017 Imaging    MRI brain- 1. No evidence of metastatic disease. 2. Intermittently motion degraded.      03/08/2017 Imaging    Bone scan: IMPRESSION: No evidence skeletal metastasis by bone scintigraphy.      03/10/2017 Miscellaneous    Consult at [facility] with Dr. Aniceto Boss:  Following Topotecan therapy, she may be a candidate for immunotherapy at time of progression/relapse, with Nivolumab versus Nivolumab/Ipilumumab.  Response rates with single-agent immunotherapy is ~ 10% and with combo therapy 20-30%.  Responses are durable.  Rate of side effects from therapy are ~ 5% and 20-30%.  This therapy is not yet FDA approved and therefore if this therapy option is approved, prior approval from insurance company will be needed.  Dr. Aniceto Boss favors the combination therapy.  Additionally, she may be eligible for a clinical trial (ie Rova-T + nivolumab/ipilumumab).        HPI Elements Small cell lung cancer  Location: Right lung  Quality: Small cell   Severity: Stage IV  Duration: Dx on 07/16/2016 with relapse of disease < 6 months after completing first line therapy.  Context:   Timing:   Modifying Factors: Tobacco abuse  Associated Signs & Symptoms: SIADH at  time of presentation    She tolerated her first cycle of chemotherapy well.  It is noted, that at her nadir, she had an [place] of 200.  No febrile neutropenia.  No signs/symptoms of issues associated with infection.  She feels great.  She denies any issues secondary to chemotherapy.  Her appetite is great.  Her weight is stable.  Her energy level is very stable and good.  She did see Dr. Aniceto Boss at [facility] for second opinion.  His note is reviewed in detail.  She denies any nausea or vomiting.  She does note nausea/vomiting associated with K-Dur use.  As a result, she admits to issues with compliance with this medication.  Review of Systems  Constitutional: Negative.  Negative for chills, fever and weight loss.  HENT: Negative.   Eyes: Negative.   Respiratory: Negative.  Negative for cough.   Cardiovascular: Negative.  Negative for chest pain.  Gastrointestinal: Negative.  Negative for blood in stool, constipation, diarrhea, melena, nausea and vomiting.  Genitourinary: Negative.   Musculoskeletal: Negative.   Skin: Negative.   Neurological: Negative.  Negative for weakness.  Endo/Heme/Allergies: Negative.   Psychiatric/Behavioral: Negative.     Past Medical History:  Diagnosis Date  . Elevated cholesterol   . GERD (gastroesophageal reflux disease)   . GI bleed   .  Goals of care, counseling/discussion 03/21/2017  . Hypertension   . Peripheral neuropathy due to chemotherapy ([place]) 02/22/2017  . Small cell carcinoma ([place]) 07/21/2016    Past Surgical History:  Procedure Laterality Date  . ABDOMINAL HYSTERECTOMY     partial  . CESAREAN SECTION    . COLONOSCOPY    . PORTACATH PLACEMENT Left 08/04/2016   Procedure: INSERTION PORT-A-CATH LEFT SUBCLAVIAN;  Surgeon: Aviva Signs, MD;  Location: [facility];  Service: General;  Laterality: Left;  . TONSILLECTOMY    . VIDEO BRONCHOSCOPY WITH ENDOBRONCHIAL ULTRASOUND N/A 07/16/2016   Procedure: VIDEO BRONCHOSCOPY WITH ENDOBRONCHIAL ULTRASOUND with  ultrasound transbronchial biopsy of node #7 and right lower lobe lesion;  Surgeon: Grace Isaac, MD;  Location: [facility] OR;  Service: Thoracic;  Laterality: N/A;    Family History  Problem Relation Age of Onset  . Heart disease Mother   . Heart disease Father   . COPD Sister   . HIV Brother     Social History   Social History  . Marital status: Married    Spouse name: N/A  . Number of children: 4  . Years of education: N/A   Occupational History  . [employer]   Social History Main Topics  . Smoking status: Current Every Day Smoker    Packs/day: 0.50    Years: 35.00    Types: Cigarettes  . Smokeless tobacco: Never Used  . Alcohol use Yes     Comment: occasionally, Couple drinks per mo  . Drug use: Yes    Frequency: 1.0 time per week    Types: Marijuana     Comment: weekly  . Sexual activity: Yes    Birth control/ protection: Surgical     Comment: married   Other Topics Concern  . None   Social History Narrative   Lives w/ husband     PHYSICAL EXAMINATION  ECOG PERFORMANCE STATUS: 1 - Symptomatic but completely ambulatory  Vitals:   03/21/17 1325  BP: 130/75  Pulse: 83  Resp: 16  Temp: 98.3 F (36.8 C)    GENERAL:alert, no distress, well nourished, well developed, comfortable, cooperative, smiling and unaccompanied SKIN: skin color, texture, turgor are normal, no rashes or significant lesions HEAD: Normocephalic, No masses, lesions, tenderness or abnormalities EYES: normal, Conjunctiva are pink and non-injected EARS: External ears normal OROPHARYNX:lips, buccal mucosa, and tongue normal and mucous membranes are moist  NECK: supple, no adenopathy, trachea midline LYMPH:  no palpable lymphadenopathy BREAST:not examined LUNGS: decreased breath sounds bilaterally throughout HEART: regular rate & rhythm, no murmurs, no gallops, S1 normal and S2 normal ABDOMEN:abdomen soft, non-tender and normal bowel sounds BACK: Back symmetric, no  curvature. EXTREMITIES:less then 2 second capillary refill, no joint deformities, effusion, or inflammation, no skin discoloration, no cyanosis  NEURO: alert & oriented x 3 with fluent speech, no focal motor/sensory deficits, gait normal   LABORATORY DATA: CBC    Component Value Date/Time   WBC 2.4 (L) 03/21/2017 1236   RBC 3.87 03/21/2017 1236   HGB 11.9 (L) 03/21/2017 1236   HCT 34.6 (L) 03/21/2017 1236   PLT 159 03/21/2017 1236   MCV 89.4 03/21/2017 1236   MCH 30.7 03/21/2017 1236   MCHC 34.4 03/21/2017 1236   RDW 13.9 03/21/2017 1236   LYMPHSABS 0.8 03/21/2017 1236   MONOABS 0.2 03/21/2017 1236   EOSABS 0.0 03/21/2017 1236   BASOSABS 0.0 03/21/2017 1236      Chemistry      Component Value Date/Time  NA 134 (L) 03/21/2017 1236   K 3.2 (L) 03/21/2017 1236   CL 98 (L) 03/21/2017 1236   CO2 27 03/21/2017 1236   BUN 21 (H) 03/21/2017 1236   CREATININE 0.90 03/21/2017 1236      Component Value Date/Time   CALCIUM 9.2 03/21/2017 1236   ALKPHOS 93 03/21/2017 1236   AST 29 03/21/2017 1236   ALT 30 03/21/2017 1236   BILITOT 0.3 03/21/2017 1236        PENDING LABS:   RADIOGRAPHIC STUDIES:  Ct Chest W Contrast  Result Date: 02/21/2017 CLINICAL DATA:  Small cell lung cancer restaging. EXAM: CT CHEST, ABDOMEN, AND PELVIS WITH CONTRAST TECHNIQUE: Multidetector CT imaging of the chest, abdomen and pelvis was performed following the standard protocol during bolus administration of intravenous contrast. CONTRAST:  185m ISOVUE-300 IOPAMIDOL (ISOVUE-300) INJECTION 61% COMPARISON:  Multiple exams, including 11/23/2016 chest CT and prior PET-CT from 07/26/2016 FINDINGS: CT CHEST FINDINGS Cardiovascular: Coronary, aortic arch, and branch vessel atherosclerotic vascular disease. The extensive new mediastinal tumor is causing extrinsic compression of the right lower lobe pulmonary artery, which is likely also partially narrowed due to vaso constriction related to hypoaeration of the  right lower lobe. Mediastinum/Nodes: Confluent right upper paratracheal adenopathy 3.6 cm in short axis on image 22/2, previously 0.6 cm. Masslike confluent subcarinal adenopathy along with right infrahilar adenopathy/tumor encases and occludes the right middle lobe and right lower lobe bronchi, with resulting complete atelectasis of the right lower lobe and some air trapping in the right middle lobe. The subcarinal lymph node measures about 4.5 cm in short axis on image 31/2 but is difficult separate from the surrounding infrahilar adenopathy which is likewise prominent. Lungs/Pleura: There are masslike densities within the collapsed right lower lobe, for example 1 measuring 3.1 by 3.9 cm on image 39 of series 2. Musculoskeletal: There is some speckled heterogeneity in the left anterior sixth rib which seems new compared to the exam of 11/23/2016. Lower cervical spondylosis. Mild dextroconvex thoracic scoliosis. CT ABDOMEN PELVIS FINDINGS Hepatobiliary: Unremarkable Pancreas: Unremarkable Spleen: Unremarkable Adrenals/Urinary Tract: Slight nodularity of the left adrenal gland was not previously hypermetabolic and is probably benign/ incidental. Kidneys unremarkable. Stomach/Bowel: Unremarkable Vascular/Lymphatic: Aortoiliac atherosclerotic vascular disease. Fusiform infrarenal abdominal aortic aneurysm 4.2 cm in diameter on image 72/2, with associated mural thrombus. This extends down to the bifurcation. Reproductive: Uterus absent.  Adnexa unremarkable. Other: No supplemental non-categorized findings. Musculoskeletal: Mild lumbar spondylosis and degenerative disc disease. IMPRESSION: 1. Unfortunately the patient has had significant recurrence with marked mediastinal and right infrahilar adenopathy as well as suspected masses in the collapsed right lower lobe. The confluent adenopathy completely occlude the right lower lobe and right middle lobe bronchi, although there is some air trapping in the right middle lobe  ; the right lower lobe is completely collapsed around several right lower lobe masses. 2. There is some speckled density in the left anterior sixth rib which is not entirely specific but which is concerning for early osseous metastatic disease given that it was not present on 11/23/2016. Consider a bone scan to further assess the skeleton for other occult bony spread. 3. Infrarenal abdominal aortic aneurysm 4.2 cm in diameter, increased from the prior measurement of 3.8 cm on 07/26/2016. Although this is only minor increase, it has occurred in a 7 month period. The patient has a previous existing relationship of cardiothoracic surgery and surveillance of this aneurysm by surgery is appropriate. 4. Other imaging findings of potential clinical significance: Coronary, aortic arch, and branch  vessel atherosclerotic vascular disease. Mild lumbar spondylosis and degenerative disc disease. Electronically Signed   By: Van Clines M.D.   On: 02/21/2017 15:38   Mr Jeri Cos BM Contrast  Result Date: 03/08/2017 CLINICAL DATA:  Restaging small cell lung cancer. EXAM: MRI HEAD WITHOUT AND WITH CONTRAST TECHNIQUE: Multiplanar, multiecho pulse sequences of the brain and surrounding structures were obtained without and with intravenous contrast. CONTRAST:  58m MULTIHANCE GADOBENATE DIMEGLUMINE 529 MG/ML IV SOLN COMPARISON:  07/31/2016 FINDINGS: Brain: No enhancement or edema to suggest metastatic disease. No acute or remote infarct, hemorrhage, hydrocephalus, or atrophy. Vascular: Normal flow voids Skull and upper cervical spine: No marrow lesion noted. Sinuses/Orbits: Negative.  No orbital mass. Other: Intermittently motion degraded study which decreases sensitivity. IMPRESSION: 1. No evidence of metastatic disease. 2. Intermittently motion degraded. Electronically Signed   By: JMonte FantasiaM.D.   On: 03/08/2017 09:38   Nm Bone Scan Whole Body  Result Date: 03/09/2017 CLINICAL DATA:  Small cell lung carcinoma.  Evaluate for skeletal metastasis EXAM: NUCLEAR MEDICINE WHOLE BODY BONE SCAN TECHNIQUE: Whole body anterior and posterior images were obtained approximately 3 hours after intravenous injection of radiopharmaceutical. RADIOPHARMACEUTICALS:  20.5 mCi Technetium-975mDP IV COMPARISON:  PET-CT 07/26/2016 CT 02/21/2017 FINDINGS: No focal accumulation of radiotracer within the axillary or appendicular skeleton to localize skeletal metastasis. IMPRESSION: No evidence skeletal metastasis by bone scintigraphy. Electronically Signed   By: StSuzy Bouchard.D.   On: 03/09/2017 16:28   Ct Abdomen Pelvis W Contrast  Result Date: 02/21/2017 CLINICAL DATA:  Small cell lung cancer restaging. EXAM: CT CHEST, ABDOMEN, AND PELVIS WITH CONTRAST TECHNIQUE: Multidetector CT imaging of the chest, abdomen and pelvis was performed following the standard protocol during bolus administration of intravenous contrast. CONTRAST:  10088mSOVUE-300 IOPAMIDOL (ISOVUE-300) INJECTION 61% COMPARISON:  Multiple exams, including 11/23/2016 chest CT and prior PET-CT from 07/26/2016 FINDINGS: CT CHEST FINDINGS Cardiovascular: Coronary, aortic arch, and branch vessel atherosclerotic vascular disease. The extensive new mediastinal tumor is causing extrinsic compression of the right lower lobe pulmonary artery, which is likely also partially narrowed due to vaso constriction related to hypoaeration of the right lower lobe. Mediastinum/Nodes: Confluent right upper paratracheal adenopathy 3.6 cm in short axis on image 22/2, previously 0.6 cm. Masslike confluent subcarinal adenopathy along with right infrahilar adenopathy/tumor encases and occludes the right middle lobe and right lower lobe bronchi, with resulting complete atelectasis of the right lower lobe and some air trapping in the right middle lobe. The subcarinal lymph node measures about 4.5 cm in short axis on image 31/2 but is difficult separate from the surrounding infrahilar adenopathy which is  likewise prominent. Lungs/Pleura: There are masslike densities within the collapsed right lower lobe, for example 1 measuring 3.1 by 3.9 cm on image 39 of series 2. Musculoskeletal: There is some speckled heterogeneity in the left anterior sixth rib which seems new compared to the exam of 11/23/2016. Lower cervical spondylosis. Mild dextroconvex thoracic scoliosis. CT ABDOMEN PELVIS FINDINGS Hepatobiliary: Unremarkable Pancreas: Unremarkable Spleen: Unremarkable Adrenals/Urinary Tract: Slight nodularity of the left adrenal gland was not previously hypermetabolic and is probably benign/ incidental. Kidneys unremarkable. Stomach/Bowel: Unremarkable Vascular/Lymphatic: Aortoiliac atherosclerotic vascular disease. Fusiform infrarenal abdominal aortic aneurysm 4.2 cm in diameter on image 72/2, with associated mural thrombus. This extends down to the bifurcation. Reproductive: Uterus absent.  Adnexa unremarkable. Other: No supplemental non-categorized findings. Musculoskeletal: Mild lumbar spondylosis and degenerative disc disease. IMPRESSION: 1. Unfortunately the patient has had significant recurrence with marked mediastinal and right infrahilar adenopathy  as well as suspected masses in the collapsed right lower lobe. The confluent adenopathy completely occlude the right lower lobe and right middle lobe bronchi, although there is some air trapping in the right middle lobe ; the right lower lobe is completely collapsed around several right lower lobe masses. 2. There is some speckled density in the left anterior sixth rib which is not entirely specific but which is concerning for early osseous metastatic disease given that it was not present on 11/23/2016. Consider a bone scan to further assess the skeleton for other occult bony spread. 3. Infrarenal abdominal aortic aneurysm 4.2 cm in diameter, increased from the prior measurement of 3.8 cm on 07/26/2016. Although this is only minor increase, it has occurred in a 7 month  period. The patient has a previous existing relationship of cardiothoracic surgery and surveillance of this aneurysm by surgery is appropriate. 4. Other imaging findings of potential clinical significance: Coronary, aortic arch, and branch vessel atherosclerotic vascular disease. Mild lumbar spondylosis and degenerative disc disease. Electronically Signed   By: Van Clines M.D.   On: 02/21/2017 15:38     PATHOLOGY:    ASSESSMENT AND PLAN:  Small cell carcinoma (H[facility]) Small cell lung cancer of right lung, limited stage at time of Dx, presenting with SIADH.  S/P 6 cycles of Cisplatin/Etoposide (07/27/2016- 11/11/2016) with curative intent.  She was evaluated by radiation oncology for involved field XRT during chemotherapy, but due to tumor burden, she was deemed not an XRT candidate.  She refused whole brain XRT in the prophylactic setting following systemic chemotherapy.  Short interval relapse of disease <6 months on restaging CT imaging on 02/21/2017 leading to salvage treatment with Topotecan for recurrent/extensive stage SCLC.  S/P second opinion at [facility] with Dr. Lissa Morales on 03/10/2017.  Oncology history is updated.  Pre-treatment labs: CBC diff, CMET.  I personally reviewed and went over laboratory results with the patient.  The results are noted within this dictation.  Labs satisfy treatment parameters. Hypokalemia is noted.  She reports intolerance to PO Kdur pills.  She notes nausea with occasional vomiting from this.  She is willing to try liquid K+ today in the clinic.  If successful and tolerable, and she wants to change to liq K+ at home, we can do that.  She is advised to cut/crush Kdur at home for 40 mEq daily. ANC is 1300. Platelet count is > 100,000.  Order for 40 mEq Liq K is ordered.  Repeat K+ labs tomorrow.  Chemotherapy will be administered today with close nurse monitoring for acute toxicities associated with chemotherapy.  She is on Gabapentin and  tolerating it well.  She is taking this at bedtime and notes that she has been only taking 300 mg at HS with good effect.  She is advised to continue with this dose and we can increase in the future if needed.  Restaging CT CAP w contrast is ordered following cycle #2.  These will be completed prior to cycle #3.   I have reviewed Dr. Vertell Limber note from [facility]: Following Topotecan therapy, she may be a candidate for immunotherapy at time of progression/relapse, with Nivolumab versus Nivolumab/Ipilumumab.  Response rates with single-agent immunotherapy is ~ 10% and with combo therapy 20-30%.  Responses are durable.  Rate of side effects from therapy are ~ 5% and 20-30%.  This therapy is not yet FDA approved and therefore if this therapy option is approved, prior approval from insurance company will be needed.  Dr. Aniceto Boss favors  the combination therapy.  Additionally, she may be eligible for a clinical trial (ie Rova-T + nivolumab/ipilumumab).  We broached the goals of care.  She is not interested in learning to much information about extensive stage small cell lung cancer survival at this time, she says "I would like to think about this."  Patients with extensive stage disease, the median survival is 8 to 13 months, and the five-year survival rate is 1 to 2 percent.  In most series, less than 5 percent of those with Extensive Stage SCLC survive beyond two years.  Prophylactic cranial irradiation decreases the incidence of symptomatic brain metastases in patients who have responded to systemic chemotherapy, although its impact on overall survival is uncertain. For patients with a response to systemic chemotherapy, thoracic radiation may be of benefit in increasing the percentage of long-term survivors.    Return in 3 weeks for follow-up, review of imaging, medical oncology recommendations, and cycle #3 of salvage therapy.  Peripheral neuropathy due to chemotherapy [facility]) Peripheral neuropathy  secondary to chemotherapy.  On Cymbalta and low-dose gabapentin.  Stable.   Number of Diagnoses or Treatment Options- Section A:      Problems to Exam Physician Problem(s) Number x Points= Results  Self-limited or minor (stable, improved, or worsening)  Max='2  1 2  '$ Est. Problem (to examiner); stable, improved   1 1  Est. Problem (to examiner); worsening   2   New problem (to examiner); no additional work-up planned  Max=1 3   New problem (to examiner); add. work-up planned   4      Total: 3    Amount and/or Complexity of Data to be Reviewed- Section B    Data to be reviewed: Points    Review and/or order of clinical lab tests 1  '[x]'$    Review and/or order of tests in the radiology section of CPT (includes nuclear med & other except cardiac cath & ECG) 1  '[]'$    Review and/or order of tests in the medicine section of CPT (e.g. EKG, cardiac cath, non-invasive vascular studies, pulmonary function studies) 1  '[x]'$    Discussion of test results with performing physician 1  '[]'$    Decision to obtain old records and/or obtaining history from someone other than patient 1  '[]'$    Review and summarization of old records and/or obtaining history from someone other than patient and/or discussion of case with another health care provider 2  '[x]'$    Independent visualization of image, tracing, or specimen itself (not simply review report) 2  '[]'$    Total:   4    Risk of  complications and/or Morbidity or Mortality- Section C  Level of Risk: Presenting Problem(s) Diagnostic Procedure(s) Ordered Management Options Selected  Minimal One self-limited or minor problem (eg cold, insect bite, tinea corporis)  Lab test requiring venipuncture  Chest xray  EKG/EEG  [place] or Echo  KOH prep  Urinalysis  Rest  Gargles  Elastic bandages  Superficial dressings  Low  Two or more self-limited or minor problems  One stable chronic illness (well-controlled HTN, non-insulin dependent diabetes, cataract,  BPH)  Acute uncomplicated illness or injury (cystitis, allergic rhinitis, simple sprain)  Physiologic test not under stress (pulm fnx tests)  Non-cardiovascular imaging studies with contrast (barium enema)  Superficial needle biopsy  Clinical laboratory tests requiring arterial puncture  Skin biopsies  OTC drugs  Minor surgery with no identified risk factors  Physical therapy  Occupational therapy  IV fluids without additives  Moderate  One  or more chronic illnesses with mild exacerbation, progression, or side effects of treatment  Two or more stable chronic illnesses  Undiagnosed new problem with uncertain prognosis (lump in breast)  Acute illness with systemic symptoms (pyelonephritis, pneumonitis, colitis)  Acute complicated injury (head injury with brief loss of consciousness)  Physiologic test under stress (cardiac stress test, fetal contraction stress test)  Diagnostic endoscopies with no identified risk factors  Deep needle or incisional biopsy  Cardiovascular imaging studies with contrast and no identified risk factors (arteriogram, cardiac cath)  Obtain fluid from body cavity (LP, thoracentesis, culdocentesis)  Minor surgery with identified risk factors  Elective surgery (open, percutaneous, or endoscopic) with no identified risk factors  Prescription drug management  Therapeutic nuclear medicine  IV fluids with additives  Closed treatment of fracture of dislocation without manipulation  High  One or more chronic illnesses with severe exacerbation, progression, or side effects of treatment.  Acute or chronic illnesses or injuries that may pose a threat to life or bodily function (multiple trauma, acute MI, PE, severe respiratory distress, progressive severe rheumatoid arthritis, psychiatric illness with potential threat to self or others, peritonitis, acute renal failure)  An abrupt change in neurological status (seizure, TIA, weakness, sensory loss)   Cardiovascular imaging studies with contrast with identified risk factors  Cardiac electrophysiological tests  Diagnostic endoscopies with identified risk factors  Discography   Elective major surgery (open, percutaneous, or endoscopic) with identified risk factor  Emergency major surgery (open, percutaneous, or endoscopic)  Parental controlled substances  Drug therapy requiring intensive monitoring for toxicity  Decision not to resuscitate or deescalate care because of poor prognosis     Final Result of Complexity      Choose decision making level with 2 or 3 checks OR choose the decision making level on Section B       A Number of diagnoses or treatment options  '[]'$   </= 1 Minimal  '[]'$   2 Limited  '[x]'$   3 Multiple  '[]'$   >/= 4 Extensive  B Amount and complexity of data  '[]'$   </= 1 Minimal or low  '[]'$   2 Limited  '[]'$   3  Moderate  '[x]'$   >/= 4 Extensive  C Highest risk  '[]'$   Minimal  '[]'$   Low  '[]'$   Moderate  '[x]'$   High   Type of decision making  '[]'$   Straight-forward  '[]'$   Low Complexity  '[]'$   Moderate- Complexity  '[x]'$   High- Complexity     ORDERS PLACED FOR THIS ENCOUNTER: Orders Placed This Encounter  Procedures  . CT Chest W Contrast  . CT Abdomen Pelvis W Contrast    MEDICATIONS PRESCRIBED THIS ENCOUNTER: Meds ordered this encounter  Medications  . potassium chloride SA (K-DUR,KLOR-CON) 20 MEQ tablet    Sig: Take 2 tablets (40 mEq total) by mouth daily.    Dispense:  60 tablet    Refill:  0    Order Specific Question:   Supervising Provider    Answer:   Brunetta Genera [[id number]]  . potassium chloride 20 MEQ/15ML (10%) solution 40 mEq    THERAPY PLAN:  Continue with salvage chemotherapy, Topotecan, with upcoming restaging tests to evaluate response to therapy.    All questions were answered. The patient knows to call the clinic with any problems, questions or concerns. We can certainly see the patient much sooner if necessary.  Patient and plan  discussed with Dr. Twana First and she is in agreement with the aforementioned.   This  note is electronically signed by: Doy Mince 03/21/2017 2:16 PM

## 2017-03-21 NOTE — Assessment & Plan Note (Signed)
Peripheral neuropathy secondary to chemotherapy.  On Cymbalta and low-dose gabapentin.  Stable.

## 2017-03-21 NOTE — Patient Instructions (Signed)
[facility] Discharge Instructions for Patients Receiving Chemotherapy   Beginning January 23rd 2017 lab work for the [facility] will be done in the  Main lab at [facility] on 1st floor. If you have a lab appointment with the [facility] please come in thru the  Main Entrance and check in at the main information desk   Today you received the following chemotherapy agents topotecan.  To help prevent nausea and vomiting after your treatment, we encourage you to take your nausea medication as instructed.   If you develop nausea and vomiting, or diarrhea that is not controlled by your medication, call the clinic.  The clinic phone number is [phone]. Office hours are Monday-Friday 8:30am-5:00pm.  BELOW ARE SYMPTOMS THAT SHOULD BE REPORTED IMMEDIATELY:  *FEVER GREATER THAN 101.0 F  *CHILLS WITH OR WITHOUT FEVER  NAUSEA AND VOMITING THAT IS NOT CONTROLLED WITH YOUR NAUSEA MEDICATION  *UNUSUAL SHORTNESS OF BREATH  *UNUSUAL BRUISING OR BLEEDING  TENDERNESS IN MOUTH AND THROAT WITH OR WITHOUT PRESENCE OF ULCERS  *URINARY PROBLEMS  *BOWEL PROBLEMS  UNUSUAL RASH Items with * indicate a potential emergency and should be followed up as soon as possible. If you have an emergency after office hours please contact your primary care physician or go to the nearest emergency department.  Please call the clinic during office hours if you have any questions or concerns.   You may also contact the Patient Navigator at [phone] should you have any questions or need assistance in obtaining follow up care.      Resources For Cancer Patients and their Caregivers ? American Cancer Society: Can assist with transportation, wigs, general needs, runs Look Good Feel Better.        ([phone] [phone] ? Cancer Care: Provides financial assistance, online support groups, medication/co-pay assistance.  1-800-813-HOPE 928-391-2493) ? [place] Assists  [place] cancer patients and their families through emotional , educational and financial support.  309-550-8922 ? [place] DSS Where to apply for food stamps, Medicaid and utility assistance. 929-595-0040 ? RCATS: Transportation to medical appointments. 814-773-4274 ? Social Security Administration: May apply for disability if have a Stage IV cancer. 316-144-1833 [phone] ? [employer], Disability and Transit Services: Assists with nutrition, care and transit needs. [phone]

## 2017-03-21 NOTE — Assessment & Plan Note (Addendum)
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  S/P 6 cycles of Cisplatin/Etoposide (07/27/2016- 11/11/2016) with curative intent.  She was evaluated by radiation oncology for involved field XRT during chemotherapy, but due to tumor burden, she was deemed not an XRT candidate.  She refused whole brain XRT in the prophylactic setting following systemic chemotherapy.  Short interval relapse of disease <6 months on restaging CT imaging on 02/21/2017 leading to salvage treatment with Topotecan for recurrent/extensive stage SCLC.  S/P second opinion at [facility] with Dr. Lissa Morales on 03/10/2017.  Oncology history is updated.  Pre-treatment labs: CBC diff, CMET.  I personally reviewed and went over laboratory results with the patient.  The results are noted within this dictation.  Labs satisfy treatment parameters. Hypokalemia is noted.  She reports intolerance to PO Kdur pills.  She notes nausea with occasional vomiting from this.  She is willing to try liquid K+ today in the clinic.  If successful and tolerable, and she wants to change to liq K+ at home, we can do that.  She is advised to cut/crush Kdur at home for 40 mEq daily. ANC is 1300. Platelet count is > 100,000.  Order for 40 mEq Liq K is ordered.  Repeat K+ labs tomorrow.  Chemotherapy will be administered today with close nurse monitoring for acute toxicities associated with chemotherapy.  She is on Gabapentin and tolerating it well.  She is taking this at bedtime and notes that she has been only taking 300 mg at HS with good effect.  She is advised to continue with this dose and we can increase in the future if needed.  Restaging CT CAP w contrast is ordered following cycle #2.  These will be completed prior to cycle #3.   I have reviewed Dr. Vertell Limber note from [facility]: Following Topotecan therapy, she may be a candidate for immunotherapy at time of progression/relapse, with Nivolumab versus  Nivolumab/Ipilumumab.  Response rates with single-agent immunotherapy is ~ 10% and with combo therapy 20-30%.  Responses are durable.  Rate of side effects from therapy are ~ 5% and 20-30%.  This therapy is not yet FDA approved and therefore if this therapy option is approved, prior approval from insurance company will be needed.  Dr. Aniceto Boss favors the combination therapy.  Additionally, she may be eligible for a clinical trial (ie Rova-T + nivolumab/ipilumumab).  We broached the goals of care.  She is not interested in learning to much information about extensive stage small cell lung cancer survival at this time, she says "I would like to think about this."  Patients with extensive stage disease, the median survival is 8 to 13 months, and the five-year survival rate is 1 to 2 percent.  In most series, less than 5 percent of those with Extensive Stage SCLC survive beyond two years.  Prophylactic cranial irradiation decreases the incidence of symptomatic brain metastases in patients who have responded to systemic chemotherapy, although its impact on overall survival is uncertain. For patients with a response to systemic chemotherapy, thoracic radiation may be of benefit in increasing the percentage of long-term survivors.    Return in 3 weeks for follow-up, review of imaging, medical oncology recommendations, and cycle #3 of salvage therapy.

## 2017-03-21 NOTE — Patient Instructions (Addendum)
[place] at [facility] Discharge Instructions  RECOMMENDATIONS MADE BY THE CONSULTANT AND ANY TEST RESULTS WILL BE SENT TO YOUR REFERRING PHYSICIAN.  You were seen today by Kirby Crigler PA-C. Treatment today. Restaging scans in 2 weeks. Return in 3 weeks for follow up.    Thank you for choosing [place] at [facility] to provide your oncology and hematology care.  To afford each patient quality time with our provider, please arrive at least 15 minutes before your scheduled appointment time.    If you have a lab appointment with the [facility] please come in thru the  Main Entrance and check in at the main information desk  You need to re-schedule your appointment should you arrive 10 or more minutes late.  We strive to give you quality time with our providers, and arriving late affects you and other patients whose appointments are after yours.  Also, if you no show three or more times for appointments you may be dismissed from the clinic at the providers discretion.     Again, thank you for choosing [facility].  Our hope is that these requests will decrease the amount of time that you wait before being seen by our physicians.       _____________________________________________________________  Should you have questions after your visit to [facility], please contact our office at [phone] between the hours of 8:30 a.m. and 4:30 p.m.  Voicemails left after 4:30 p.m. will not be returned until the following business day.  For prescription refill requests, have your pharmacy contact our office.       Resources For Cancer Patients and their Caregivers ? American Cancer Society: Can assist with transportation, wigs, general needs, runs Look Good Feel Better.        ([phone] ? Cancer Care: Provides financial assistance, online support groups, medication/co-pay assistance.  1-800-813-HOPE  ([phone]) ? [place] Assists [place] cancer patients and their families through emotional , educational and financial support.  818-710-6067 ? [place] DSS Where to apply for food stamps, Medicaid and utility assistance. [phone] ? RCATS: Transportation to medical appointments. 631-165-2539 ? Social Security Administration: May apply for disability if have a Stage IV cancer. [phone] ? [employer], Disability and Transit Services: Assists with nutrition, care and transit needs. [facility] Support Programs: '@10RELATIVEDAYS'$ @ > Cancer Support Group  2nd Tuesday of the month 1pm-2pm, Journey Room  > Creative Journey  3rd Tuesday of the month 1130am-1pm, Journey Room  > Look Good Feel Better  1st Wednesday of the month 10am-12 noon, Journey Room (Call [facility] to register [phone])

## 2017-03-21 NOTE — Progress Notes (Signed)
OK to treat with ANC 1.3 per T.Kefalas,PA. Tolerated chemo well. Stable and ambulatory on discharge home to self.

## 2017-03-22 ENCOUNTER — Encounter (HOSPITAL_BASED_OUTPATIENT_CLINIC_OR_DEPARTMENT_OTHER): Payer: Managed Care, Other (non HMO)

## 2017-03-22 ENCOUNTER — Ambulatory Visit (HOSPITAL_COMMUNITY): Payer: Managed Care, Other (non HMO)

## 2017-03-22 VITALS — BP 136/83 | HR 74 | Temp 98.7°F | Resp 18

## 2017-03-22 DIAGNOSIS — C3431 Malignant neoplasm of lower lobe, right bronchus or lung: Secondary | ICD-10-CM

## 2017-03-22 DIAGNOSIS — Z5111 Encounter for antineoplastic chemotherapy: Secondary | ICD-10-CM

## 2017-03-22 DIAGNOSIS — C801 Malignant (primary) neoplasm, unspecified: Secondary | ICD-10-CM

## 2017-03-22 MED ORDER — SODIUM CHLORIDE 0.9 % IV SOLN
Freq: Once | INTRAVENOUS | Status: AC
  Administered 2017-03-22: 14:00:00 via INTRAVENOUS

## 2017-03-22 MED ORDER — TOPOTECAN HCL CHEMO INJECTION 4 MG
1.5000 mg/m2 | Freq: Once | INTRAVENOUS | Status: AC
  Administered 2017-03-22: 2.6 mg via INTRAVENOUS
  Filled 2017-03-22: qty 2.6

## 2017-03-22 MED ORDER — HEPARIN SOD (PORK) LOCK FLUSH 100 UNIT/ML IV SOLN
500.0000 [IU] | Freq: Once | INTRAVENOUS | Status: AC | PRN
  Administered 2017-03-22: 500 [IU]

## 2017-03-22 MED ORDER — SODIUM CHLORIDE 0.9% FLUSH: 10.0000 mL | INTRAVENOUS | Status: DC | PRN

## 2017-03-22 MED ORDER — PROCHLORPERAZINE MALEATE 10 MG PO TABS
10.0000 mg | ORAL_TABLET | Freq: Once | ORAL | Status: AC
  Administered 2017-03-22: 10 mg via ORAL
  Filled 2017-03-22: qty 1

## 2017-03-22 NOTE — Patient Instructions (Signed)
[facility] Discharge Instructions for Patients Receiving Chemotherapy   Beginning January 23rd 2017 lab work for the [facility] will be done in the  Main lab at [facility] on 1st floor. If you have a lab appointment with the [facility] please come in thru the  Main Entrance and check in at the main information desk   Today you received the following chemotherapy agents:  Topotecan  If you develop nausea and vomiting, or diarrhea that is not controlled by your medication, call the clinic.  The clinic phone number is [phone]. Office hours are Monday-Friday 8:30am-5:00pm.  BELOW ARE SYMPTOMS THAT SHOULD BE REPORTED IMMEDIATELY:  *FEVER GREATER THAN 101.0 F  *CHILLS WITH OR WITHOUT FEVER  NAUSEA AND VOMITING THAT IS NOT CONTROLLED WITH YOUR NAUSEA MEDICATION  *UNUSUAL SHORTNESS OF BREATH  *UNUSUAL BRUISING OR BLEEDING  TENDERNESS IN MOUTH AND THROAT WITH OR WITHOUT PRESENCE OF ULCERS  *URINARY PROBLEMS  *BOWEL PROBLEMS  UNUSUAL RASH Items with * indicate a potential emergency and should be followed up as soon as possible. If you have an emergency after office hours please contact your primary care physician or go to the nearest emergency department.  Please call the clinic during office hours if you have any questions or concerns.   You may also contact the Patient Navigator at [phone] should you have any questions or need assistance in obtaining follow up care.      Resources For Cancer Patients and their Caregivers ? American Cancer Society: Can assist with transportation, wigs, general needs, runs Look Good Feel Better.        989-727-7397 ? Cancer Care: Provides financial assistance, online support groups, medication/co-pay assistance.  1-800-813-HOPE 415-805-7277) ? [place] Assists [place] cancer patients and their families through emotional , educational and financial support.   2761143627 ? [place] DSS Where to apply for food stamps, Medicaid and utility assistance. 564-854-1688 ? RCATS: Transportation to medical appointments. [phone] ? Social Security Administration: May apply for disability if have a Stage IV cancer. [phone] [phone] ? [employer], Disability and Transit Services: Assists with nutrition, care and transit needs. [phone]

## 2017-03-23 ENCOUNTER — Ambulatory Visit (HOSPITAL_COMMUNITY): Payer: Managed Care, Other (non HMO)

## 2017-03-23 ENCOUNTER — Encounter (HOSPITAL_BASED_OUTPATIENT_CLINIC_OR_DEPARTMENT_OTHER): Payer: Managed Care, Other (non HMO)

## 2017-03-23 VITALS — BP 154/89 | HR 75 | Temp 98.0°F | Resp 16

## 2017-03-23 DIAGNOSIS — Z5111 Encounter for antineoplastic chemotherapy: Secondary | ICD-10-CM

## 2017-03-23 DIAGNOSIS — C3431 Malignant neoplasm of lower lobe, right bronchus or lung: Secondary | ICD-10-CM | POA: Diagnosis not present

## 2017-03-23 DIAGNOSIS — C801 Malignant (primary) neoplasm, unspecified: Secondary | ICD-10-CM

## 2017-03-23 MED ORDER — PROCHLORPERAZINE MALEATE 10 MG PO TABS
ORAL_TABLET | ORAL | Status: AC
  Filled 2017-03-23: qty 1

## 2017-03-23 MED ORDER — SODIUM CHLORIDE 0.9% FLUSH
10.0000 mL | INTRAVENOUS | Status: DC | PRN
  Administered 2017-03-23: 10 mL
  Filled 2017-03-23: qty 10

## 2017-03-23 MED ORDER — HEPARIN SOD (PORK) LOCK FLUSH 100 UNIT/ML IV SOLN
500.0000 [IU] | Freq: Once | INTRAVENOUS | Status: AC | PRN
  Administered 2017-03-23: 500 [IU]
  Filled 2017-03-23: qty 5

## 2017-03-23 MED ORDER — PROCHLORPERAZINE MALEATE 10 MG PO TABS
10.0000 mg | ORAL_TABLET | Freq: Once | ORAL | Status: AC
  Administered 2017-03-23: 10 mg via ORAL

## 2017-03-23 MED ORDER — TOPOTECAN HCL CHEMO INJECTION 4 MG
1.5000 mg/m2 | Freq: Once | INTRAVENOUS | Status: AC
  Administered 2017-03-23: 2.6 mg via INTRAVENOUS
  Filled 2017-03-23: qty 2.6

## 2017-03-23 MED ORDER — SODIUM CHLORIDE 0.9 % IV SOLN
Freq: Once | INTRAVENOUS | Status: AC
  Administered 2017-03-23: 11:00:00 via INTRAVENOUS

## 2017-03-23 NOTE — Progress Notes (Signed)
Chemotherapy given today per orders. Patient complaining of vomiting first thing in the morning, only the week of chemotherapy. Reported to [facility] PA-C. Instructed patient to take her nausea pill at night and in the morning first thing to see if that helps. Vitals stable and discharged home from clinic ambulatory. Follow up as scheduled.

## 2017-03-23 NOTE — Patient Instructions (Signed)
[facility] Discharge Instructions for Patients Receiving Chemotherapy   Beginning January 23rd 2017 lab work for the Cancer Center will be done in the  Main lab at [place] on 1st floor. If you have a lab appointment with the Cancer Center please come in thru the  Main Entrance and check in at the main information desk   Today you received the following chemotherapy agents   To help prevent nausea and vomiting after your treatment, we encourage you to take your nausea medication     If you develop nausea and vomiting, or diarrhea that is not controlled by your medication, call the clinic.  The clinic phone number is [phone]. Office hours are Monday-Friday 8:30am-5:00pm.  BELOW ARE SYMPTOMS THAT SHOULD BE REPORTED IMMEDIATELY:  *FEVER GREATER THAN 101.0 F  *CHILLS WITH OR WITHOUT FEVER  NAUSEA AND VOMITING THAT IS NOT CONTROLLED WITH YOUR NAUSEA MEDICATION  *UNUSUAL SHORTNESS OF BREATH  *UNUSUAL BRUISING OR BLEEDING  TENDERNESS IN MOUTH AND THROAT WITH OR WITHOUT PRESENCE OF ULCERS  *URINARY PROBLEMS  *BOWEL PROBLEMS  UNUSUAL RASH Items with * indicate a potential emergency and should be followed up as soon as possible. If you have an emergency after office hours please contact your primary care physician or go to the nearest emergency department.  Please call the clinic during office hours if you have any questions or concerns.   You may also contact the Patient Navigator at [phone] should you have any questions or need assistance in obtaining follow up care.      Resources For Cancer Patients and their Caregivers ? American Cancer Society: Can assist with transportation, wigs, general needs, runs Look Good Feel Better.        1-888-227-6333 ? Cancer Care: Provides financial assistance, online support groups, medication/co-pay assistance.  1-800-813-HOPE (4673) ? [facility] Assists [place] cancer  patients and their families through emotional , educational and financial support.  336-427-4357 ? [place] DSS Where to apply for food stamps, Medicaid and utility assistance. 336-342-1394 ? RCATS: Transportation to medical appointments. 336-347-2287 ? Social Security Administration: May apply for disability if have a Stage IV cancer. [phone] 1-800-772-1213 ? [place] Aging, Disability and Transit Services: Assists with nutrition, care and transit needs. [phone]         

## 2017-03-24 ENCOUNTER — Encounter (HOSPITAL_BASED_OUTPATIENT_CLINIC_OR_DEPARTMENT_OTHER): Payer: Managed Care, Other (non HMO)

## 2017-03-24 ENCOUNTER — Ambulatory Visit (HOSPITAL_COMMUNITY): Payer: Managed Care, Other (non HMO)

## 2017-03-24 VITALS — BP 132/85 | HR 69 | Temp 98.0°F | Resp 16

## 2017-03-24 DIAGNOSIS — Z5111 Encounter for antineoplastic chemotherapy: Secondary | ICD-10-CM | POA: Diagnosis not present

## 2017-03-24 DIAGNOSIS — C3431 Malignant neoplasm of lower lobe, right bronchus or lung: Secondary | ICD-10-CM

## 2017-03-24 DIAGNOSIS — C801 Malignant (primary) neoplasm, unspecified: Secondary | ICD-10-CM

## 2017-03-24 MED ORDER — SODIUM CHLORIDE 0.9% FLUSH
10.0000 mL | INTRAVENOUS | Status: DC | PRN
  Administered 2017-03-24: 10 mL
  Filled 2017-03-24: qty 10

## 2017-03-24 MED ORDER — SODIUM CHLORIDE 0.9 % IV SOLN
1.5000 mg/m2 | Freq: Once | INTRAVENOUS | Status: AC
  Administered 2017-03-24: 2.6 mg via INTRAVENOUS
  Filled 2017-03-24: qty 2.6

## 2017-03-24 MED ORDER — HEPARIN SOD (PORK) LOCK FLUSH 100 UNIT/ML IV SOLN
500.0000 [IU] | Freq: Once | INTRAVENOUS | Status: AC | PRN
  Administered 2017-03-24: 500 [IU]

## 2017-03-24 MED ORDER — SODIUM CHLORIDE 0.9 % IV SOLN
Freq: Once | INTRAVENOUS | Status: AC
  Administered 2017-03-24: 12:00:00 via INTRAVENOUS

## 2017-03-24 MED ORDER — PROCHLORPERAZINE MALEATE 10 MG PO TABS
10.0000 mg | ORAL_TABLET | Freq: Once | ORAL | Status: AC
  Administered 2017-03-24: 10 mg via ORAL
  Filled 2017-03-24: qty 1

## 2017-03-24 MED ORDER — HEPARIN SOD (PORK) LOCK FLUSH 100 UNIT/ML IV SOLN
INTRAVENOUS | Status: AC
  Filled 2017-03-24: qty 5

## 2017-03-24 NOTE — Patient Instructions (Signed)
[facility] Discharge Instructions for Patients Receiving Chemotherapy   Beginning January 23rd 2017 lab work for the Cancer Center will be done in the  Main lab at [facility] on 1st floor. If you have a lab appointment with the Cancer Center please come in thru the  Main Entrance and check in at the main information desk   Today you received the following chemotherapy agents   To help prevent nausea and vomiting after your treatment, we encourage you to take your nausea medication     If you develop nausea and vomiting, or diarrhea that is not controlled by your medication, call the clinic.  The clinic phone number is [phone]. Office hours are Monday-Friday 8:30am-5:00pm.  BELOW ARE SYMPTOMS THAT SHOULD BE REPORTED IMMEDIATELY:  *FEVER GREATER THAN 101.0 F  *CHILLS WITH OR WITHOUT FEVER  NAUSEA AND VOMITING THAT IS NOT CONTROLLED WITH YOUR NAUSEA MEDICATION  *UNUSUAL SHORTNESS OF BREATH  *UNUSUAL BRUISING OR BLEEDING  TENDERNESS IN MOUTH AND THROAT WITH OR WITHOUT PRESENCE OF ULCERS  *URINARY PROBLEMS  *BOWEL PROBLEMS  UNUSUAL RASH Items with * indicate a potential emergency and should be followed up as soon as possible. If you have an emergency after office hours please contact your primary care physician or go to the nearest emergency department.  Please call the clinic during office hours if you have any questions or concerns.   You may also contact the Patient Navigator at [phone] should you have any questions or need assistance in obtaining follow up care.      Resources For Cancer Patients and their Caregivers ? American Cancer Society: Can assist with transportation, wigs, general needs, runs Look Good Feel Better.        1-888-227-6333 ? Cancer Care: Provides financial assistance, online support groups, medication/co-pay assistance.  1-800-813-HOPE (4673) ? [facility] Assists [place] cancer  patients and their families through emotional , educational and financial support.  336-427-4357 ? [place] DSS Where to apply for food stamps, Medicaid and utility assistance. 336-342-1394 ? [employer]: Transportation to medical appointments. 336-347-2287 ? Social Security Administration: May apply for disability if have a Stage IV cancer. [phone] 1-800-772-1213 ? [place] Aging, Disability and Transit Services: Assists with nutrition, care and transit needs. [phone]         

## 2017-03-24 NOTE — Progress Notes (Signed)
Chemotherapy given today per orders. Patient tolerated it well without problems. Vitals stable and discharged home from clinic ambulatory.follow up as scheduled.

## 2017-03-25 ENCOUNTER — Ambulatory Visit (HOSPITAL_COMMUNITY): Payer: Managed Care, Other (non HMO)

## 2017-03-25 ENCOUNTER — Encounter (HOSPITAL_BASED_OUTPATIENT_CLINIC_OR_DEPARTMENT_OTHER): Payer: Managed Care, Other (non HMO)

## 2017-03-25 VITALS — BP 127/81 | HR 69 | Temp 98.1°F | Resp 18

## 2017-03-25 DIAGNOSIS — C801 Malignant (primary) neoplasm, unspecified: Secondary | ICD-10-CM

## 2017-03-25 DIAGNOSIS — Z5111 Encounter for antineoplastic chemotherapy: Secondary | ICD-10-CM

## 2017-03-25 DIAGNOSIS — C3431 Malignant neoplasm of lower lobe, right bronchus or lung: Secondary | ICD-10-CM | POA: Diagnosis not present

## 2017-03-25 MED ORDER — TOPOTECAN HCL CHEMO INJECTION 4 MG
1.5000 mg/m2 | Freq: Once | INTRAVENOUS | Status: AC
  Administered 2017-03-25: 2.6 mg via INTRAVENOUS
  Filled 2017-03-25: qty 2.6

## 2017-03-25 MED ORDER — SODIUM CHLORIDE 0.9 % IV SOLN
Freq: Once | INTRAVENOUS | Status: AC
  Administered 2017-03-25: 11:00:00 via INTRAVENOUS

## 2017-03-25 MED ORDER — SODIUM CHLORIDE 0.9% FLUSH: 10.0000 mL | INTRAVENOUS | Status: DC | PRN

## 2017-03-25 MED ORDER — HEPARIN SOD (PORK) LOCK FLUSH 100 UNIT/ML IV SOLN
INTRAVENOUS | Status: AC
  Filled 2017-03-25: qty 5

## 2017-03-25 MED ORDER — PEGFILGRASTIM 6 MG/0.6ML ~~LOC~~ PSKT
PREFILLED_SYRINGE | SUBCUTANEOUS | Status: AC
  Filled 2017-03-25: qty 0.6

## 2017-03-25 MED ORDER — HEPARIN SOD (PORK) LOCK FLUSH 100 UNIT/ML IV SOLN
500.0000 [IU] | Freq: Once | INTRAVENOUS | Status: AC | PRN
  Administered 2017-03-25: 500 [IU]

## 2017-03-25 MED ORDER — PROCHLORPERAZINE MALEATE 10 MG PO TABS
ORAL_TABLET | ORAL | Status: AC
  Filled 2017-03-25: qty 1

## 2017-03-25 MED ORDER — PEGFILGRASTIM 6 MG/0.6ML ~~LOC~~ PSKT
6.0000 mg | PREFILLED_SYRINGE | Freq: Once | SUBCUTANEOUS | Status: AC
  Administered 2017-03-25: 6 mg via SUBCUTANEOUS

## 2017-03-25 MED ORDER — PROCHLORPERAZINE MALEATE 10 MG PO TABS
10.0000 mg | ORAL_TABLET | Freq: Once | ORAL | Status: AC
  Administered 2017-03-25: 10 mg via ORAL

## 2017-03-25 NOTE — Progress Notes (Signed)
Tolerated chemo well. Lydia KitchenClovia Moore arrived today for [facility] neulasta on body injector. See MAR for administration details. Injector in place and engaged with green light indicator on flashing. Tolerated application with out problems. Stable and ambulatory on discharge home to self.

## 2017-03-25 NOTE — Patient Instructions (Signed)
[facility] Discharge Instructions for Patients Receiving Chemotherapy   Beginning January 23rd 2017 lab work for the [facility] will be done in the  Main lab at [facility] on 1st floor. If you have a lab appointment with the [facility] please come in thru the  Main Entrance and check in at the main information desk   Today you received the following chemotherapy agents day 5 topotecan. Neulasta device will dispense medication between 2 pm and 3 pm tomorrow. You may take device off aff 3 pm tomorrow. To help prevent nausea and vomiting after your treatment, we encourage you to take your nausea medication as instructed.   If you develop nausea and vomiting, or diarrhea that is not controlled by your medication, call the clinic.  The clinic phone number is [phone]. Office hours are Monday-Friday 8:30am-5:00pm.  BELOW ARE SYMPTOMS THAT SHOULD BE REPORTED IMMEDIATELY:  *FEVER GREATER THAN 101.0 F  *CHILLS WITH OR WITHOUT FEVER  NAUSEA AND VOMITING THAT IS NOT CONTROLLED WITH YOUR NAUSEA MEDICATION  *UNUSUAL SHORTNESS OF BREATH  *UNUSUAL BRUISING OR BLEEDING  TENDERNESS IN MOUTH AND THROAT WITH OR WITHOUT PRESENCE OF ULCERS  *URINARY PROBLEMS  *BOWEL PROBLEMS  UNUSUAL RASH Items with * indicate a potential emergency and should be followed up as soon as possible. If you have an emergency after office hours please contact your primary care physician or go to the nearest emergency department.  Please call the clinic during office hours if you have any questions or concerns.   You may also contact the Patient Navigator at [phone] should you have any questions or need assistance in obtaining follow up care.      Resources For Cancer Patients and their Caregivers ? American Cancer Society: Can assist with transportation, wigs, general needs, runs Look Good Feel Better.        541-868-8759 ? Cancer Care: Provides financial assistance, online  support groups, medication/co-pay assistance.  1-800-813-HOPE (907) 619-4394) ? [place] Assists [place] cancer patients and their families through emotional , educational and financial support.  6186589016 ? [place] DSS Where to apply for food stamps, Medicaid and utility assistance. (323)284-0677 ? [facility]: Transportation to medical appointments. 610-802-1580 ? Social Security Administration: May apply for disability if have a Stage IV cancer. 404 640 2727 (281) 487-9746 ? [employer], Disability and Transit Services: Assists with nutrition, care and transit needs. [phone]

## 2017-04-05 ENCOUNTER — Other Ambulatory Visit (HOSPITAL_COMMUNITY): Payer: Self-pay | Admitting: Oncology

## 2017-04-05 DIAGNOSIS — Z1231 Encounter for screening mammogram for malignant neoplasm of breast: Secondary | ICD-10-CM

## 2017-04-07 ENCOUNTER — Encounter (HOSPITAL_COMMUNITY): Payer: Self-pay

## 2017-04-07 ENCOUNTER — Ambulatory Visit (HOSPITAL_COMMUNITY)
Admission: RE | Admit: 2017-04-07 | Discharge: 2017-04-07 | Disposition: A | Discharge status: Home / Self Care | Payer: Managed Care, Other (non HMO) | Source: Ambulatory Visit | Attending: Oncology | Admitting: Oncology

## 2017-04-07 DIAGNOSIS — J189 Pneumonia, unspecified organism: Secondary | ICD-10-CM | POA: Insufficient documentation

## 2017-04-07 DIAGNOSIS — C801 Malignant (primary) neoplasm, unspecified: Secondary | ICD-10-CM | POA: Diagnosis present

## 2017-04-07 DIAGNOSIS — R918 Other nonspecific abnormal finding of lung field: Secondary | ICD-10-CM | POA: Diagnosis not present

## 2017-04-07 DIAGNOSIS — I714 Abdominal aortic aneurysm, without rupture: Secondary | ICD-10-CM | POA: Insufficient documentation

## 2017-04-07 DIAGNOSIS — R591 Generalized enlarged lymph nodes: Secondary | ICD-10-CM | POA: Diagnosis not present

## 2017-04-07 MED ORDER — IOPAMIDOL (ISOVUE-300) INJECTION 61%
100.0000 mL | Freq: Once | INTRAVENOUS | Status: AC | PRN
  Administered 2017-04-07: 100 mL via INTRAVENOUS

## 2017-04-12 ENCOUNTER — Encounter (HOSPITAL_COMMUNITY): Payer: Self-pay | Admitting: Oncology

## 2017-04-12 ENCOUNTER — Encounter (HOSPITAL_BASED_OUTPATIENT_CLINIC_OR_DEPARTMENT_OTHER): Payer: Managed Care, Other (non HMO) | Admitting: Oncology

## 2017-04-12 ENCOUNTER — Encounter (HOSPITAL_COMMUNITY): Payer: Managed Care, Other (non HMO)

## 2017-04-12 DIAGNOSIS — C801 Malignant (primary) neoplasm, unspecified: Secondary | ICD-10-CM

## 2017-04-12 DIAGNOSIS — C3491 Malignant neoplasm of unspecified part of right bronchus or lung: Secondary | ICD-10-CM | POA: Diagnosis not present

## 2017-04-12 DIAGNOSIS — D649 Anemia, unspecified: Secondary | ICD-10-CM

## 2017-04-12 DIAGNOSIS — Z720 Tobacco use: Secondary | ICD-10-CM | POA: Diagnosis not present

## 2017-04-12 DIAGNOSIS — G620 Drug-induced polyneuropathy: Secondary | ICD-10-CM

## 2017-04-12 DIAGNOSIS — E871 Hypo-osmolality and hyponatremia: Secondary | ICD-10-CM

## 2017-04-12 DIAGNOSIS — Z7189 Other specified counseling: Secondary | ICD-10-CM

## 2017-04-12 DIAGNOSIS — T451X5A Adverse effect of antineoplastic and immunosuppressive drugs, initial encounter: Secondary | ICD-10-CM

## 2017-04-12 DIAGNOSIS — J301 Allergic rhinitis due to pollen: Secondary | ICD-10-CM

## 2017-04-12 DIAGNOSIS — G4700 Insomnia, unspecified: Secondary | ICD-10-CM

## 2017-04-12 LAB — CBC WITH DIFFERENTIAL/PLATELET
Basophils Absolute: 0 10*3/uL (ref 0.0–0.1)
Basophils Relative: 0 %
EOS ABS: 0 10*3/uL (ref 0.0–0.7)
Eosinophils Relative: 0 %
HEMATOCRIT: 26.4 % — AB (ref 36.0–46.0)
HEMOGLOBIN: 9.2 g/dL — AB (ref 12.0–15.0)
LYMPHS ABS: 0.8 10*3/uL (ref 0.7–4.0)
Lymphocytes Relative: 9 %
MCH: 31.3 pg (ref 26.0–34.0)
MCHC: 34.8 g/dL (ref 30.0–36.0)
MCV: 89.8 fL (ref 78.0–100.0)
MONO ABS: 0.5 10*3/uL (ref 0.1–1.0)
MONOS PCT: 5 %
NEUTROS PCT: 86 %
Neutro Abs: 7.9 10*3/uL — ABNORMAL HIGH (ref 1.7–7.7)
Platelets: 279 10*3/uL (ref 150–400)
RBC: 2.94 MIL/uL — ABNORMAL LOW (ref 3.87–5.11)
RDW: 18.4 % — ABNORMAL HIGH (ref 11.5–15.5)
WBC: 9.2 10*3/uL (ref 4.0–10.5)

## 2017-04-12 LAB — COMPREHENSIVE METABOLIC PANEL
ALBUMIN: 3.9 g/dL (ref 3.5–5.0)
ALT: 30 U/L (ref 14–54)
AST: 34 U/L (ref 15–41)
Alkaline Phosphatase: 84 U/L (ref 38–126)
Anion gap: 11 (ref 5–15)
BUN: 11 mg/dL (ref 6–20)
CHLORIDE: 87 mmol/L — AB (ref 101–111)
CO2: 25 mmol/L (ref 22–32)
CREATININE: 0.78 mg/dL (ref 0.44–1.00)
Calcium: 8.5 mg/dL — ABNORMAL LOW (ref 8.9–10.3)
GFR calc Af Amer: >60 mL/min (ref >60)
GFR calc non Af Amer: >60 mL/min (ref >60)
GLUCOSE: 129 mg/dL — AB (ref 65–99)
POTASSIUM: 3.2 mmol/L — AB (ref 3.5–5.1)
Sodium: 123 mmol/L — ABNORMAL LOW (ref 135–145)
Total Bilirubin: 0.5 mg/dL (ref 0.3–1.2)
Total Protein: 6.6 g/dL (ref 6.5–8.1)

## 2017-04-12 LAB — CBC
HEMATOCRIT: 25.3 % — AB (ref 36.0–46.0)
Hemoglobin: 8.9 g/dL — ABNORMAL LOW (ref 12.0–15.0)
MCH: 31.4 pg (ref 26.0–34.0)
MCHC: 35.2 g/dL (ref 30.0–36.0)
MCV: 89.4 fL (ref 78.0–100.0)
PLATELETS: 275 10*3/uL (ref 150–400)
RBC: 2.83 MIL/uL — ABNORMAL LOW (ref 3.87–5.11)
RDW: 18.5 % — AB (ref 11.5–15.5)
WBC: 9.2 10*3/uL (ref 4.0–10.5)

## 2017-04-12 LAB — TSH: TSH: 2.386 u[IU]/mL (ref 0.350–4.500)

## 2017-04-12 LAB — MAGNESIUM: Magnesium: 1.6 mg/dL — ABNORMAL LOW (ref 1.7–2.4)

## 2017-04-12 LAB — OSMOLALITY, URINE: Osmolality, Ur: 517 mOsm/kg (ref 300–900)

## 2017-04-12 LAB — SODIUM, URINE, RANDOM: Sodium, Ur: 82 mmol/L

## 2017-04-12 LAB — SODIUM: Sodium: 123 mmol/L — ABNORMAL LOW (ref 135–145)

## 2017-04-12 MED ORDER — ZOLPIDEM TARTRATE 10 MG PO TABS: 10.0000 mg | ORAL_TABLET | Freq: Every evening | ORAL | 3 refills | Status: DC | PRN

## 2017-04-12 MED ORDER — LORATADINE 10 MG PO TABS: 10.0000 mg | ORAL_TABLET | Freq: Every day | ORAL | 2 refills | Status: DC

## 2017-04-12 MED ORDER — HEPARIN SOD (PORK) LOCK FLUSH 100 UNIT/ML IV SOLN
500.0000 [IU] | Freq: Once | INTRAVENOUS | Status: AC
  Administered 2017-04-12: 500 [IU] via INTRAVENOUS

## 2017-04-12 MED ORDER — HEPARIN SOD (PORK) LOCK FLUSH 100 UNIT/ML IV SOLN
INTRAVENOUS | Status: AC
  Filled 2017-04-12: qty 5

## 2017-04-12 MED ORDER — SODIUM CHLORIDE 0.9% FLUSH
20.0000 mL | INTRAVENOUS | Status: DC | PRN
  Administered 2017-04-12: 20 mL via INTRAVENOUS
  Filled 2017-04-12: qty 20

## 2017-04-12 MED ORDER — SODIUM CHLORIDE 1 G PO TABS: 1.0000 g | ORAL_TABLET | Freq: Two times a day (BID) | ORAL | 0 refills | Status: DC

## 2017-04-12 NOTE — Assessment & Plan Note (Signed)
Peripheral neuropathy, secondary to chemotherapy.  On Cymbalta and low-dose gabapentin.  Stable.

## 2017-04-12 NOTE — Assessment & Plan Note (Signed)
Ongoing goals of care discussion took place today.

## 2017-04-12 NOTE — Assessment & Plan Note (Addendum)
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  S/P 6 cycles of Cisplatin/Etoposide (07/27/2016- 11/11/2016) with curative intent.  She was evaluated by radiation oncology for involved field XRT during chemotherapy, but due to tumor burden, she was deemed not an XRT candidate.  She refused whole brain XRT in the prophylactic setting following systemic chemotherapy.  Short interval relapse of disease <6 months on restaging CT imaging on 02/21/2017 leading to salvage treatment with Topotecan for recurrent/extensive stage SCLC.  S/P second opinion at [place] with Dr. Lissa Morales on 03/10/2017.  Oncology history is updated.  Pre-treatment labs today: CBC diff, CMET.  I personally reviewed and went over laboratory results with the patient.  The results are noted within this dictation.    HYPONATREMIA noted today at 123, significantly lower than 3 weeks ago.  She is asymptomatic.  Given this, chemotherapy will be held to allow for work-up. Anemia below baseline is also noted.  I have re-ordered CBC and sodium.  Results confirm previous results.  I have recommended hospitalization for work-up, but she refuses.  I have reviewed the risks of hospitalization refusal in addition to hyponatremia.  I have ordered TSH, urine sodium, and urine osmolality. I have printed an Rx for sodium chloride tablets.  She is euvolemic on exam.  I will try to increase sodium x 5 points every 24-48 hours.    Chemotherapy deferred x 1 week.  If sodium is up to 130 or greater next week, then we can proceed with chemotherapy.  She is advised to report to ED with any signs/symptoms of hyponatremia.  Labs on Thursday: CBC, BMET, anemia panel.  Based upon results, we can order further testing.  I personally reviewed and went over radiographic studies with the patient.  The results are noted within this dictation.  I personally reviewed the images in PACS.  CT imaging demonstrates a  positive response to therapy but with persistent bulky mediastinal and right hilar/infrahilar lymphadenopathy.  Significant decrease in size of right lower lobe mass and obstructive pneumonitis.  I have previously reviewed Dr. Vertell Limber note from [facility]: Following Topotecan therapy, she may be a candidate for immunotherapy at time of progression/relapse, with Nivolumab versus Nivolumab/Ipilumumab.  Response rates with single-agent immunotherapy is ~ 10% and with combo therapy 20-30%.  Responses are durable.  Rate of side effects from therapy are ~ 5% and 20-30%.  This therapy is not yet FDA approved and therefore if this therapy option is approved, prior approval from insurance company will be needed.  Dr. Aniceto Boss favors the combination therapy.  Additionally, she may be eligible for a clinical trial (ie Rova-T + nivolumab/ipilumumab).  Patients with extensive stage disease, the median survival is 8 to 13 months, and the five-year survival rate is 1 to 2 percent.  In most series, less than 5 percent of those with Extensive Stage SCLC survive beyond two years.  Prophylactic cranial irradiation decreases the incidence of symptomatic brain metastases in patients who have responded to systemic chemotherapy, although its impact on overall survival is uncertain. For patients with a response to systemic chemotherapy, thoracic radiation may be of benefit in increasing the percentage of long-term survivors.    I refilled her Claritin and Ambien Rx.  Return next week for follow-up and cycle #3, if sodium is improved.

## 2017-04-12 NOTE — Progress Notes (Signed)
Practice, [facility] [place] [address]  Small cell carcinoma [facility]) - Plan: CBC with Differential, Magnesium, CBC with Differential, Comprehensive metabolic panel, Magnesium, CBC, Sodium, Basic metabolic panel, CBC with Differential  Peripheral neuropathy due to chemotherapy (HCC)  Goals of care, counseling/discussion  Seasonal allergic rhinitis due to pollen - Plan: loratadine (CLARITIN) 10 MG tablet  Insomnia, unspecified type - Plan: zolpidem (AMBIEN) 10 MG tablet  Hyponatremia - Plan: CBC, Sodium, Sodium, urine, random, Osmolality, urine, sodium chloride 1 g tablet, Osmolality, urine, Sodium, urine, random, TSH, Basic metabolic panel  Anemia, unspecified type - Plan: Vitamin B12, Folate, Iron and TIBC, Ferritin  CURRENT THERAPY: Salvage Topotecan therapy beginning on 02/28/2017.  INTERVAL HISTORY: Lydia Moore 56 y.o. female returns for followup of small cell lung cancer right lung, limited stage, at time of diagnosis, presenting with SIADH.  S/P 6 cycles of cisplatin/etoposide (07/27/2016-11/11/2016) curative intent.  She was evaluated by radiation oncology for involved field XRT during chemotherapy, but due to tumor burden, she was deemed not an XRT candidate.  She refused whole brain XRT in the prophylactic setting following systemic chemotherapy.  Short interval relapse of disease less than 6 months on restaging CT scans on 02/21/2017 leading to salvage treatment with Topotecan for recurrent rash extensive stage small cell lung cancer.  S/P second opinion at [facility] with Dr. Lissa Morales on 03/10/2017.    Small cell carcinoma ([place])   07/12/2016 Imaging    CTA chest 3.9 x 3.5 cm right lower lobe mass with imaging features most compatible with a primary lung carcinoma.Marked metastatic right hilar and mediastinal adenopathy including a confluent mass of adenopathy in the right hilum, encasing and causing marked narrowing of the central pulmonary arteries  on the right with a short segment of occlusion of the right lower lobe bronchus.Mild postobstructive changes in the inferior, medial right lower lobe.Mild changes of COPD       07/16/2016 Procedure    Endoscopic bronchoscopy with transbronchial biopsy of #7 nodes and biopsy of RLL lung mass      07/16/2016 Pathology Results    Lung, biopsy, Right Lower Lobe - SMALL CELL CARCINOMA.       07/21/2016 Imaging    MRI brain Negative MRI of the brain. No evidence for metastatic disease to the brain or meninges.      07/26/2016 PET scan    Markedly hypermetabolic right lower lobe lesion with hypermetabolic metastatic disease in the right hilum and mediastinum. 2. Several scattered hypermetabolic ground-glass opacities in the right upper lobe. FDG accumulation within these nodules is higher than typically seen for infectious/inflammatory etiology although this remains within the differential. Atypical appearance of metastatic spread would also be a consideration. 3. Coronary artery atherosclerosis. 4. **An incidental finding of potential clinical significance has been found. 3.8 cm abdominal aortic aneurysm       07/27/2016 - 11/11/2016 Chemotherapy    Cisplatin/Etoposide x 6 cycles      11/09/2016 Treatment Plan Change    Patient declined/refused whole brain radiation.      11/23/2016 Imaging    CT chest with No new or progressive findings in the chest. 2. Further interval decrease in size of mediastinal lymphadenopathy. 3. Only trace scarring visible in the right lower lobe, at the location of the previous right lower lobe mass. 4. Coronary artery and thoracoabdominal aortic atherosclerosis      02/21/2017 Imaging    CT CAP- 1. Unfortunately the patient has  had significant recurrence with marked mediastinal and right infrahilar adenopathy as well as suspected masses in the collapsed right lower lobe. The confluent adenopathy completely occlude the right lower lobe and right  middle lobe bronchi, although there is some air trapping in the right middle lobe ; the right lower lobe is completely collapsed around several right lower lobe masses. 2. There is some speckled density in the left anterior sixth rib which is not entirely specific but which is concerning for early osseous metastatic disease given that it was not present on 11/23/2016. Consider a bone scan to further assess the skeleton for other occult bony spread. 3. Infrarenal abdominal aortic aneurysm 4.2 cm in diameter, increased from the prior measurement of 3.8 cm on 07/26/2016. Although this is only minor increase, it has occurred in a 7 month period. The patient has a previous existing relationship of cardiothoracic surgery and surveillance of this aneurysm by surgery is appropriate. 4. Other imaging findings of potential clinical significance: Coronary, aortic arch, and branch vessel atherosclerotic vascular disease. Mild lumbar spondylosis and degenerative disc disease.      02/22/2017 Relapse/Recurrence    Last Treatment Date: 11/11/2016 Recent Lab Values: Recent Lab Values: N/A      02/28/2017 -  Chemotherapy    Topotecan       03/08/2017 Imaging    MRI brain- 1. No evidence of metastatic disease. 2. Intermittently motion degraded.      03/08/2017 Imaging    Bone scan: IMPRESSION: No evidence skeletal metastasis by bone scintigraphy.      03/10/2017 Miscellaneous    Consult at [facility] with Dr. Aniceto Boss:  Following Topotecan therapy, she may be a candidate for immunotherapy at time of progression/relapse, with Nivolumab versus Nivolumab/Ipilumumab.  Response rates with single-agent immunotherapy is ~ 10% and with combo therapy 20-30%.  Responses are durable.  Rate of side effects from therapy are ~ 5% and 20-30%.  This therapy is not yet FDA approved and therefore if this therapy option is approved, prior approval from insurance company will be needed.  Dr. Aniceto Boss favors the  combination therapy.  Additionally, she may be eligible for a clinical trial (ie Rova-T + nivolumab/ipilumumab).      04/07/2017 Imaging    CT CAP- 1. Persistent but improved bulky mediastinal and right hilar/infrahilar lymphadenopathy and significant decrease in size of the right lower lobe mass and obstructive pneumonitis. Residual branching endobronchial tumor in the right lower lobe. 2. No findings for pulmonary metastatic nodules or abdominal/pelvic lymphadenopathy. 3. Stable infrarenal abdominal aortic aneurysm.      04/12/2017 Treatment Plan Change    Tx deferred x 1 week due to hyponatremia requiring work-up.        HPI Elements SCLC  Location: Right lung  Quality: Small cell lung cancer  Severity: Stage IV, recurrent/relapse.   Duration: Dx on 07/16/2016 with relapse identified in 02/2017 on Ct  Imaging.  Context: < 6 months relapse of disease following initial treatment with Cisplatin/Etoposide for limited stage SCLC  Timing:   Modifying Factors: Tobacco abuse  Associated Signs & Symptoms: Initially presenting with SIADH.   She feels great.  She denies any complaints.  She is happy to learn of her CT scan results on 04/07/2017.  Her appetite is stable and good.  She remains very active.  She denies any new medications or changes in diet.  She does not drink much H2O.  She reports drinking ~ 32 oz per day and this is stable for her.  "I am  not a big drinker."  Review of Systems  Constitutional: Negative.  Negative for chills, fever and weight loss.  HENT: Negative.   Eyes: Negative.   Respiratory: Negative.  Negative for cough.   Cardiovascular: Negative.  Negative for chest pain.  Gastrointestinal: Negative.  Negative for blood in stool, constipation, diarrhea, melena, nausea and vomiting.  Genitourinary: Negative.   Musculoskeletal: Negative.   Skin: Negative.   Neurological: Negative.  Negative for dizziness, tremors, sensory change, speech change, focal weakness,  seizures, loss of consciousness, weakness and headaches.  Endo/Heme/Allergies: Negative.   Psychiatric/Behavioral: Negative.     Past Medical History:  Diagnosis Date  . Elevated cholesterol   . GERD (gastroesophageal reflux disease)   . GI bleed   . Goals of care, counseling/discussion 03/21/2017  . Hypertension   . Peripheral neuropathy due to chemotherapy ([place]) 02/22/2017  . Small cell carcinoma ([place]) 07/21/2016    Past Surgical History:  Procedure Laterality Date  . ABDOMINAL HYSTERECTOMY     partial  . CESAREAN SECTION    . COLONOSCOPY    . PORTACATH PLACEMENT Left 08/04/2016   Procedure: INSERTION PORT-A-CATH LEFT SUBCLAVIAN;  Surgeon: Aviva Signs, MD;  Location: [facility];  Service: General;  Laterality: Left;  . TONSILLECTOMY    . VIDEO BRONCHOSCOPY WITH ENDOBRONCHIAL ULTRASOUND N/A 07/16/2016   Procedure: VIDEO BRONCHOSCOPY WITH ENDOBRONCHIAL ULTRASOUND with ultrasound transbronchial biopsy of node #7 and right lower lobe lesion;  Surgeon: Grace Isaac, MD;  Location[facility];  Service: Thoracic;  Laterality: N/A;    Family History  Problem Relation Age of Onset  . Heart disease Mother   . Heart disease Father   . COPD Sister   . HIV Brother     Social History   Social History  . Marital status: Married    Spouse name: N/A  . Number of children: 4  . Years of education: N/A   Occupational History  . [employer]   Social History Main Topics  . Smoking status: Current Every Day Smoker    Packs/day: 0.50    Years: 35.00    Types: Cigarettes  . Smokeless tobacco: Never Used  . Alcohol use Yes     Comment: occasionally, Couple drinks per mo  . Drug use: Yes    Frequency: 1.0 time per week    Types: Marijuana     Comment: weekly  . Sexual activity: Yes    Birth control/ protection: Surgical     Comment: married   Other Topics Concern  . None   Social History Narrative   Lives w/ husband     PHYSICAL EXAMINATION  ECOG PERFORMANCE STATUS: 0  - Asymptomatic  There were no vitals filed for this visit.  Vitals - 1 value per visit 4/97/0263  SYSTOLIC 785  DIASTOLIC 74  Pulse 74  Temperature 97.9  Respirations 18  Weight (lb) 142.6    GENERAL:alert, no distress, well nourished, well developed, comfortable, cooperative, smiling and in chemo-recliner, accompanied by daughter. SKIN: skin color, texture, turgor are normal, no rashes or significant lesions HEAD: Normocephalic, No masses, lesions, tenderness or abnormalities EYES: normal, EOMI, Conjunctiva are pink and non-injected EARS: External ears normal OROPHARYNX:lips, buccal mucosa, and tongue normal and mucous membranes are moist  NECK: supple, no adenopathy, trachea midline LYMPH:  not examined BREAST:not examined LUNGS: clear to auscultation and percussion, decreased breath sounds bilaterally. HEART: regular rate & rhythm, no murmurs, no gallops, S1 normal and S2 normal ABDOMEN:abdomen soft and normal bowel sounds  BACK: Back symmetric, no curvature. EXTREMITIES:less then 2 second capillary refill, no joint deformities, effusion, or inflammation, no edema, no skin discoloration, no cyanosis  NEURO: alert & oriented x 3 with fluent speech, no focal motor/sensory deficits, gait normal   LABORATORY DATA: CBC    Component Value Date/Time   WBC 9.2 04/12/2017 1457   RBC 2.83 (L) 04/12/2017 1457   HGB 8.9 (L) 04/12/2017 1457   HCT 25.3 (L) 04/12/2017 1457   PLT 275 04/12/2017 1457   MCV 89.4 04/12/2017 1457   MCH 31.4 04/12/2017 1457   MCHC 35.2 04/12/2017 1457   RDW 18.5 (H) 04/12/2017 1457   LYMPHSABS 0.8 04/12/2017 1331   MONOABS 0.5 04/12/2017 1331   EOSABS 0.0 04/12/2017 1331   BASOSABS 0.0 04/12/2017 1331      Chemistry      Component Value Date/Time   NA 123 (L) 04/12/2017 1457   K 3.2 (L) 04/12/2017 1331   CL 87 (L) 04/12/2017 1331   CO2 25 04/12/2017 1331   BUN 11 04/12/2017 1331   CREATININE 0.78 04/12/2017 1331      Component Value Date/Time    CALCIUM 8.5 (L) 04/12/2017 1331   ALKPHOS 84 04/12/2017 1331   AST 34 04/12/2017 1331   ALT 30 04/12/2017 1331   BILITOT 0.5 04/12/2017 1331        PENDING LABS:   RADIOGRAPHIC STUDIES:  Ct Chest W Contrast  Result Date: 04/07/2017 CLINICAL DATA:  Restaging small cell lung cancer. Status post chemotherapy. EXAM: CT CHEST, ABDOMEN, AND PELVIS WITH CONTRAST TECHNIQUE: Multidetector CT imaging of the chest, abdomen and pelvis was performed following the standard protocol during bolus administration of intravenous contrast. CONTRAST:  147mL ISOVUE-300 IOPAMIDOL (ISOVUE-300) INJECTION 61% COMPARISON:  CT 02/21/2017 FINDINGS: CT CHEST FINDINGS Cardiovascular: The heart is normal in size. No pericardial effusion. Stable tortuosity, ectasia and calcification of the thoracic aorta without focal aneurysm or dissection. Stable three-vessel coronary artery calcifications. Mediastinum/Nodes: Persistent bulky put slightly improved mediastinal lymphadenopathy. Pretracheal nodal mass at the level of the aortic arch previously measured 4.8 x 4.2 cm and now measures 3.1 x 3.1 cm. Bulky subcarinal tumor previously measured 5.5 x 5.2 cm and now measures 4.0 x 3.6 cm. Large right infrahilar mass and adenopathy measures approximately 6 x 5.3 cm and previously measured 7.4 x 7.0 cm. Lungs/Pleura: Branching right lower lobe disease is likely endobronchial tumor and measures a maximum of 2.7 cm. This was obscured by extensive tumor and obstructive pneumonia on the prior CT. Re- aeration of the right lower lobe is noted. There is still fairly significant compression of the right lower lobe bronchus. No definite metastatic pulmonary nodules. No acute overlying pulmonary process. No malignant pleural effusion. Musculoskeletal: No significant bony findings. CT ABDOMEN PELVIS FINDINGS Hepatobiliary: No evidence of hepatic metastatic disease. Gallbladder is normal. No common bile duct dilatation. Pancreas: No mass,  inflammation or ductal dilatation. Spleen: Normal size.  No focal lesions. Adrenals/Urinary Tract: Stable small nodules involving the mid left adrenal gland. The right adrenal gland is normal. No renal abnormalities. Stomach/Bowel: The stomach, duodenum, small bowel and colon are unremarkable. No inflammatory changes, mass lesions or obstructive findings. The terminal ileum is normal. The appendix is normal. Vascular/Lymphatic: Stable tortuosity and ectasia of the abdominal aorta with stable infrarenal abdominal aortic aneurysm and moderate mural thrombus. Maximal measurements are 4.3 x 3.8 cm on image number 71. Stable extensive iliac artery calcifications. No mesenteric or retroperitoneal mass or lymphadenopathy. There are small scattered lymph nodes which  are stable. Reproductive: Surgically absent. Other: No pelvic mass or adenopathy. No free pelvic fluid collections. No inguinal mass or adenopathy. No abdominal wall hernia or subcutaneous lesions. Musculoskeletal: No significant bony findings. Mild osteoporosis for age. No worrisome bone lesions. IMPRESSION: 1. Persistent but improved bulky mediastinal and right hilar/infrahilar lymphadenopathy and significant decrease in size of the right lower lobe mass and obstructive pneumonitis. Residual branching endobronchial tumor in the right lower lobe. 2. No findings for pulmonary metastatic nodules or abdominal/pelvic lymphadenopathy. 3. Stable infrarenal abdominal aortic aneurysm. Electronically Signed   By: Marijo Sanes M.D.   On: 04/07/2017 15:47   Ct Abdomen Pelvis W Contrast  Result Date: 04/07/2017 CLINICAL DATA:  Restaging small cell lung cancer. Status post chemotherapy. EXAM: CT CHEST, ABDOMEN, AND PELVIS WITH CONTRAST TECHNIQUE: Multidetector CT imaging of the chest, abdomen and pelvis was performed following the standard protocol during bolus administration of intravenous contrast. CONTRAST:  157mL ISOVUE-300 IOPAMIDOL (ISOVUE-300) INJECTION 61%  COMPARISON:  CT 02/21/2017 FINDINGS: CT CHEST FINDINGS Cardiovascular: The heart is normal in size. No pericardial effusion. Stable tortuosity, ectasia and calcification of the thoracic aorta without focal aneurysm or dissection. Stable three-vessel coronary artery calcifications. Mediastinum/Nodes: Persistent bulky put slightly improved mediastinal lymphadenopathy. Pretracheal nodal mass at the level of the aortic arch previously measured 4.8 x 4.2 cm and now measures 3.1 x 3.1 cm. Bulky subcarinal tumor previously measured 5.5 x 5.2 cm and now measures 4.0 x 3.6 cm. Large right infrahilar mass and adenopathy measures approximately 6 x 5.3 cm and previously measured 7.4 x 7.0 cm. Lungs/Pleura: Branching right lower lobe disease is likely endobronchial tumor and measures a maximum of 2.7 cm. This was obscured by extensive tumor and obstructive pneumonia on the prior CT. Re- aeration of the right lower lobe is noted. There is still fairly significant compression of the right lower lobe bronchus. No definite metastatic pulmonary nodules. No acute overlying pulmonary process. No malignant pleural effusion. Musculoskeletal: No significant bony findings. CT ABDOMEN PELVIS FINDINGS Hepatobiliary: No evidence of hepatic metastatic disease. Gallbladder is normal. No common bile duct dilatation. Pancreas: No mass, inflammation or ductal dilatation. Spleen: Normal size.  No focal lesions. Adrenals/Urinary Tract: Stable small nodules involving the mid left adrenal gland. The right adrenal gland is normal. No renal abnormalities. Stomach/Bowel: The stomach, duodenum, small bowel and colon are unremarkable. No inflammatory changes, mass lesions or obstructive findings. The terminal ileum is normal. The appendix is normal. Vascular/Lymphatic: Stable tortuosity and ectasia of the abdominal aorta with stable infrarenal abdominal aortic aneurysm and moderate mural thrombus. Maximal measurements are 4.3 x 3.8 cm on image number 71.  Stable extensive iliac artery calcifications. No mesenteric or retroperitoneal mass or lymphadenopathy. There are small scattered lymph nodes which are stable. Reproductive: Surgically absent. Other: No pelvic mass or adenopathy. No free pelvic fluid collections. No inguinal mass or adenopathy. No abdominal wall hernia or subcutaneous lesions. Musculoskeletal: No significant bony findings. Mild osteoporosis for age. No worrisome bone lesions. IMPRESSION: 1. Persistent but improved bulky mediastinal and right hilar/infrahilar lymphadenopathy and significant decrease in size of the right lower lobe mass and obstructive pneumonitis. Residual branching endobronchial tumor in the right lower lobe. 2. No findings for pulmonary metastatic nodules or abdominal/pelvic lymphadenopathy. 3. Stable infrarenal abdominal aortic aneurysm. Electronically Signed   By: Marijo Sanes M.D.   On: 04/07/2017 15:47     PATHOLOGY:    ASSESSMENT AND PLAN:  Small cell carcinoma (HCC) Small cell lung cancer of right lung, limited stage at  time of Dx, presenting with SIADH.  S/P 6 cycles of Cisplatin/Etoposide (07/27/2016- 11/11/2016) with curative intent.  She was evaluated by radiation oncology for involved field XRT during chemotherapy, but due to tumor burden, she was deemed not an XRT candidate.  She refused whole brain XRT in the prophylactic setting following systemic chemotherapy.  Short interval relapse of disease <6 months on restaging CT imaging on 02/21/2017 leading to salvage treatment with Topotecan for recurrent/extensive stage SCLC.  S/P second opinion at [facility] with Dr. Lissa Morales on 03/10/2017.  Oncology history is updated.  Pre-treatment labs today: CBC diff, CMET.  I personally reviewed and went over laboratory results with the patient.  The results are noted within this dictation.    HYPONATREMIA noted today at 123, significantly lower than 3 weeks ago.  She is asymptomatic.  Given this, chemotherapy will  be held to allow for work-up. Anemia below baseline is also noted.  I have re-ordered CBC and sodium.  Results confirm previous results.  I have recommended hospitalization for work-up, but she refuses.  I have reviewed the risks of hospitalization refusal in addition to hyponatremia.  I have ordered TSH, urine sodium, and urine osmolality. I have printed an Rx for sodium chloride tablets.  She is euvolemic on exam.  I will try to increase sodium x 5 points every 24-48 hours.    Chemotherapy deferred x 1 week.  If sodium is up to 130 or greater next week, then we can proceed with chemotherapy.  She is advised to report to ED with any signs/symptoms of hyponatremia.  Labs on Thursday: CBC, BMET, anemia panel.  Based upon results, we can order further testing.  I personally reviewed and went over radiographic studies with the patient.  The results are noted within this dictation.  I personally reviewed the images in PACS.  CT imaging demonstrates a positive response to therapy but with persistent bulky mediastinal and right hilar/infrahilar lymphadenopathy.  Significant decrease in size of right lower lobe mass and obstructive pneumonitis.  I have previously reviewed Dr. Vertell Limber note from [facility]: Following Topotecan therapy, she may be a candidate for immunotherapy at time of progression/relapse, with Nivolumab versus Nivolumab/Ipilumumab.  Response rates with single-agent immunotherapy is ~ 10% and with combo therapy 20-30%.  Responses are durable.  Rate of side effects from therapy are ~ 5% and 20-30%.  This therapy is not yet FDA approved and therefore if this therapy option is approved, prior approval from insurance company will be needed.  Dr. Aniceto Boss favors the combination therapy.  Additionally, she may be eligible for a clinical trial (ie Rova-T + nivolumab/ipilumumab).  Patients with extensive stage disease, the median survival is 8 to 13 months, and the five-year survival rate is  1 to 2 percent.  In most series, less than 5 percent of those with Extensive Stage SCLC survive beyond two years.  Prophylactic cranial irradiation decreases the incidence of symptomatic brain metastases in patients who have responded to systemic chemotherapy, although its impact on overall survival is uncertain. For patients with a response to systemic chemotherapy, thoracic radiation may be of benefit in increasing the percentage of long-term survivors.    I refilled her Claritin and Ambien Rx.  Return next week for follow-up and cycle #3, if sodium is improved.  Peripheral neuropathy due to chemotherapy [facility]) Peripheral neuropathy, secondary to chemotherapy.  On Cymbalta and low-dose gabapentin.  Stable.  Goals of care, counseling/discussion Ongoing goals of care discussion took place today.   ORDERS PLACED  FOR THIS ENCOUNTER: Orders Placed This Encounter  Procedures  . CBC with Differential  . Magnesium  . CBC with Differential  . Comprehensive metabolic panel  . Magnesium  . CBC  . Sodium  . Sodium, urine, random  . Osmolality, urine  . TSH  . Basic metabolic panel  . CBC with Differential  . Vitamin B12  . Folate  . Iron and TIBC  . Ferritin    MEDICATIONS PRESCRIBED THIS ENCOUNTER: Meds ordered this encounter  Medications  . zolpidem (AMBIEN) 10 MG tablet    Sig: Take 1 tablet (10 mg total) by mouth at bedtime as needed for sleep.    Dispense:  30 tablet    Refill:  3    Order Specific Question:   Supervising Provider    Answer:   Brunetta Genera [[id number]]  . loratadine (CLARITIN) 10 MG tablet    Sig: Take 1 tablet (10 mg total) by mouth daily.    Dispense:  30 tablet    Refill:  2    Order Specific Question:   Supervising Provider    Answer:   Brunetta Genera [[id number]]  . sodium chloride 1 g tablet    Sig: Take 1 tablet (1 g total) by mouth 2 (two) times daily with a meal.    Dispense:  60 tablet    Refill:  0    Order Specific Question:    Supervising Provider    Answer:   Brunetta Genera [[id number]]    THERAPY PLAN:  Tx deferred x 1 week due to hyponatremia.  All questions were answered. The patient knows to call the clinic with any problems, questions or concerns. We can certainly see the patient much sooner if necessary.  Patient and plan discussed with Dr. Twana First and she is in agreement with the aforementioned.   This note is electronically signed by: Doy Mince 04/12/2017 5:19 PM

## 2017-04-12 NOTE — Patient Instructions (Addendum)
[place] at [facility] Discharge Instructions  RECOMMENDATIONS MADE BY THE CONSULTANT AND ANY TEST RESULTS WILL BE SENT TO YOUR REFERRING PHYSICIAN.  You were seen today by Kirby Crigler PA-C. Refills given for Claritin and Ambien. Defer treatment today x1week. Sodium Chloride tabs, 1 gram po two times a day.  Urine sent today.  Continue same food and H2O intake. Return on Thursday for labs. Return in Monday for follow up and possible treatment.   [email or web address]   Thank you for choosing [place] at [facility] to provide your oncology and hematology care.  To afford each patient quality time with our provider, please arrive at least 15 minutes before your scheduled appointment time.    If you have a lab appointment with the [place] please come in thru the  Main Entrance and check in at the main information desk  You need to re-schedule your appointment should you arrive 10 or more minutes late.  We strive to give you quality time with our providers, and arriving late affects you and other patients whose appointments are after yours.  Also, if you no show three or more times for appointments you may be dismissed from the clinic at the providers discretion.     Again, thank you for choosing [facility].  Our hope is that these requests will decrease the amount of time that you wait before being seen by our physicians.       _____________________________________________________________  Should you have questions after your visit to [facility], please contact our office at [phone] between the hours of 8:30 a.m. and 4:30 p.m.  Voicemails left after 4:30 p.m. will not be returned until the following business day.  For prescription refill requests, have your pharmacy contact our office.       Resources For Cancer Patients and their Caregivers ? American Cancer Society: Can assist with  transportation, wigs, general needs, runs Look Good Feel Better.        205-160-9413 ? Cancer Care: Provides financial assistance, online support groups, medication/co-pay assistance.  1-800-813-HOPE (609)411-1229) ? [place] Assists [place] cancer patients and their families through emotional , educational and financial support.  773-344-9408 ? [place] DSS Where to apply for food stamps, Medicaid and utility assistance. ([phone] ? RCATS: Transportation to medical appointments. 6263914212 ? Social Security Administration: May apply for disability if have a Stage IV cancer. 936-216-8430 3198564229 ? [employer], Disability and Transit Services: Assists with nutrition, care and transit needs. [place] Support Programs: @10RELATIVEDAYS @ > Cancer Support Group  2nd Tuesday of the month 1pm-2pm, Journey Room  > Creative Journey  3rd Tuesday of the month 1130am-1pm, Journey Room  > Look Good Feel Better  1st Wednesday of the month 10am-12 noon, Journey Room (Call [place] to register [phone])

## 2017-04-12 NOTE — Progress Notes (Signed)
Lydia Moore tolerated port lab draw and flush well without complaints or incident. Labs including Na of 123 reviewed with Dr Irene Limbo and repeat labs were ordered. Results of repeat labs were also reviewed with Dr. Irene Limbo and pt's chemo was held today with repeat labs to be drawn on Thursday and pt started on Sodium Chloride pills. Pt discharged self ambulatory in satisfactory condition accompanied by family memeber

## 2017-04-12 NOTE — Patient Instructions (Signed)
[place] at [facility] Discharge Instructions  RECOMMENDATIONS MADE BY THE CONSULTANT AND ANY TEST RESULTS WILL BE SENT TO YOUR REFERRING PHYSICIAN.  Labs drawn from port then flushed per protocol. Chemotherapy held today. Follow-up as scheduled. Call clinic for any questions or concerns  Thank you for choosing [place] at [facility] to provide your oncology and hematology care.  To afford each patient quality time with our provider, please arrive at least 15 minutes before your scheduled appointment time.    If you have a lab appointment with the [place] please come in thru the  Main Entrance and check in at the main information desk  You need to re-schedule your appointment should you arrive 10 or more minutes late.  We strive to give you quality time with our providers, and arriving late affects you and other patients whose appointments are after yours.  Also, if you no show three or more times for appointments you may be dismissed from the clinic at the providers discretion.     Again, thank you for choosing [facility]  Our hope is that these requests will decrease the amount of time that you wait before being seen by our physicians.       _____________________________________________________________  Should you have questions after your visit to [facility], please contact our office at [phone] between the hours of 8:30 a.m. and 4:30 p.m.  Voicemails left after 4:30 p.m. will not be returned until the following business day.  For prescription refill requests, have your pharmacy contact our office.       Resources For Cancer Patients and their Caregivers ? American Cancer Society: Can assist with transportation, wigs, general needs, runs Look Good Feel Better.        ([phone] ? Cancer Care: Provides financial assistance, online support groups, medication/co-pay assistance.   1-800-813-HOPE (919)429-5923) ? [place] Assists [place] cancer patients and their families through emotional , educational and financial support.  (609)872-4085 ? [place] DSS Where to apply for food stamps, Medicaid and utility assistance. 2170779774 ? RCATS: Transportation to medical appointments. 903-803-6255 ? Social Security Administration: May apply for disability if have a Stage IV cancer. 580-075-0059 506-507-0483 ? [employer], Disability and Transit Services: Assists with nutrition, care and transit needs. [place] Support Programs: @10RELATIVEDAYS @ > Cancer Support Group  2nd Tuesday of the month 1pm-2pm, Journey Room  > Creative Journey  3rd Tuesday of the month 1130am-1pm, Journey Room  > Look Good Feel Better  1st Wednesday of the month 10am-12 noon, Journey Room (Call [place] to register [phone])

## 2017-04-13 ENCOUNTER — Ambulatory Visit (HOSPITAL_COMMUNITY)
Admission: RE | Admit: 2017-04-13 | Discharge: 2017-04-13 | Disposition: A | Discharge status: Home / Self Care | Payer: Managed Care, Other (non HMO) | Source: Ambulatory Visit | Attending: Oncology | Admitting: Oncology

## 2017-04-13 ENCOUNTER — Ambulatory Visit (HOSPITAL_COMMUNITY): Payer: Managed Care, Other (non HMO)

## 2017-04-13 ENCOUNTER — Other Ambulatory Visit (HOSPITAL_COMMUNITY): Payer: Self-pay | Admitting: Oncology

## 2017-04-13 DIAGNOSIS — E871 Hypo-osmolality and hyponatremia: Secondary | ICD-10-CM

## 2017-04-13 DIAGNOSIS — Z1231 Encounter for screening mammogram for malignant neoplasm of breast: Secondary | ICD-10-CM | POA: Insufficient documentation

## 2017-04-14 ENCOUNTER — Ambulatory Visit (HOSPITAL_COMMUNITY): Payer: Managed Care, Other (non HMO)

## 2017-04-14 ENCOUNTER — Other Ambulatory Visit (HOSPITAL_COMMUNITY): Payer: Managed Care, Other (non HMO)

## 2017-04-14 ENCOUNTER — Encounter (HOSPITAL_COMMUNITY): Payer: Self-pay

## 2017-04-14 ENCOUNTER — Encounter (HOSPITAL_BASED_OUTPATIENT_CLINIC_OR_DEPARTMENT_OTHER): Payer: Managed Care, Other (non HMO)

## 2017-04-14 ENCOUNTER — Encounter (HOSPITAL_COMMUNITY): Payer: Managed Care, Other (non HMO)

## 2017-04-14 DIAGNOSIS — D649 Anemia, unspecified: Secondary | ICD-10-CM

## 2017-04-14 DIAGNOSIS — C801 Malignant (primary) neoplasm, unspecified: Secondary | ICD-10-CM

## 2017-04-14 DIAGNOSIS — E871 Hypo-osmolality and hyponatremia: Secondary | ICD-10-CM | POA: Diagnosis not present

## 2017-04-14 LAB — BASIC METABOLIC PANEL
Anion gap: 9 (ref 5–15)
BUN: 11 mg/dL (ref 6–20)
CALCIUM: 8.7 mg/dL — AB (ref 8.9–10.3)
CO2: 27 mmol/L (ref 22–32)
CREATININE: 0.73 mg/dL (ref 0.44–1.00)
Chloride: 86 mmol/L — ABNORMAL LOW (ref 101–111)
GFR calc non Af Amer: >60 mL/min (ref >60)
Glucose, Bld: 98 mg/dL (ref 65–99)
Potassium: 3.3 mmol/L — ABNORMAL LOW (ref 3.5–5.1)
SODIUM: 122 mmol/L — AB (ref 135–145)

## 2017-04-14 LAB — ACTH STIMULATION, 3 TIME POINTS
CORTISOL 30 MIN: 31.7 ug/dL
CORTISOL 60 MIN: 34.7 ug/dL
CORTISOL BASE: 10.9 ug/dL

## 2017-04-14 LAB — CBC WITH DIFFERENTIAL/PLATELET
Basophils Absolute: 0 10*3/uL (ref 0.0–0.1)
Basophils Relative: 0 %
EOS PCT: 0 %
Eosinophils Absolute: 0 10*3/uL (ref 0.0–0.7)
HCT: 28.1 % — ABNORMAL LOW (ref 36.0–46.0)
Hemoglobin: 9.8 g/dL — ABNORMAL LOW (ref 12.0–15.0)
LYMPHS ABS: 0.8 10*3/uL (ref 0.7–4.0)
LYMPHS PCT: 12 %
MCH: 31.1 pg (ref 26.0–34.0)
MCHC: 34.9 g/dL (ref 30.0–36.0)
MCV: 89.2 fL (ref 78.0–100.0)
Monocytes Absolute: 0.8 10*3/uL (ref 0.1–1.0)
Monocytes Relative: 12 %
NEUTROS ABS: 4.9 10*3/uL (ref 1.7–7.7)
NEUTROS PCT: 76 %
Platelets: 346 10*3/uL (ref 150–400)
RBC: 3.15 MIL/uL — ABNORMAL LOW (ref 3.87–5.11)
RDW: 18.9 % — AB (ref 11.5–15.5)
WBC: 6.5 10*3/uL (ref 4.0–10.5)

## 2017-04-14 LAB — FERRITIN: FERRITIN: 225 ng/mL (ref 11–307)

## 2017-04-14 LAB — MAGNESIUM: MAGNESIUM: 1.7 mg/dL (ref 1.7–2.4)

## 2017-04-14 LAB — VITAMIN B12: VITAMIN B 12: 2829 pg/mL — AB (ref 180–914)

## 2017-04-14 LAB — IRON AND TIBC
Iron: 34 ug/dL (ref 28–170)
Saturation Ratios: 14 % (ref 10.4–31.8)
TIBC: 251 ug/dL (ref 250–450)
UIBC: 217 ug/dL

## 2017-04-14 LAB — CORTISOL: CORTISOL PLASMA: 10.6 ug/dL

## 2017-04-14 LAB — FOLATE: Folate: 20.2 ng/mL (ref >5.9)

## 2017-04-14 MED ORDER — SODIUM CHLORIDE 0.9 % IV SOLN
INTRAVENOUS | Status: DC
  Administered 2017-04-14: 10:00:00 via INTRAVENOUS

## 2017-04-14 MED ORDER — HEPARIN SOD (PORK) LOCK FLUSH 100 UNIT/ML IV SOLN
INTRAVENOUS | Status: AC
  Filled 2017-04-14: qty 5

## 2017-04-14 MED ORDER — SODIUM CHLORIDE 0.9% FLUSH
10.0000 mL | INTRAVENOUS | Status: DC | PRN
  Administered 2017-04-14: 10 mL via INTRAVENOUS
  Filled 2017-04-14: qty 10

## 2017-04-14 MED ORDER — COSYNTROPIN 0.25 MG IJ SOLR
0.2500 mg | Freq: Once | INTRAMUSCULAR | Status: AC
  Administered 2017-04-14: 0.25 mg via INTRAVENOUS
  Filled 2017-04-14: qty 0.25

## 2017-04-14 MED ORDER — HEPARIN SOD (PORK) LOCK FLUSH 100 UNIT/ML IV SOLN
500.0000 [IU] | Freq: Once | INTRAVENOUS | Status: AC
  Administered 2017-04-14: 500 [IU] via INTRAVENOUS

## 2017-04-14 NOTE — Patient Instructions (Signed)
[place] at [facility] Discharge Instructions  RECOMMENDATIONS MADE BY THE CONSULTANT AND ANY TEST RESULTS WILL BE SENT TO YOUR REFERRING PHYSICIAN.  Cortrosyn given today Labs drawn per orders Follow up as scheduled.  Thank you for choosing [place] at [facility] to provide your oncology and hematology care.  To afford each patient quality time with our provider, please arrive at least 15 minutes before your scheduled appointment time.    If you have a lab appointment with the [place] please come in thru the  Main Entrance and check in at the main information desk  You need to re-schedule your appointment should you arrive 10 or more minutes late.  We strive to give you quality time with our providers, and arriving late affects you and other patients whose appointments are after yours.  Also, if you no show three or more times for appointments you may be dismissed from the clinic at the providers discretion.     Again, thank you for choosing [facility].  Our hope is that these requests will decrease the amount of time that you wait before being seen by our physicians.       _____________________________________________________________  Should you have questions after your visit to [facility], please contact our office at [phone] between the hours of 8:30 a.m. and 4:30 p.m.  Voicemails left after 4:30 p.m. will not be returned until the following business day.  For prescription refill requests, have your pharmacy contact our office.       Resources For Cancer Patients and their Caregivers ? American Cancer Society: Can assist with transportation, wigs, general needs, runs Look Good Feel Better.        ([phone] ? Cancer Care: Provides financial assistance, online support groups, medication/co-pay assistance.  1-800-813-HOPE 810-235-8443) ? [place] Assists [place]  cancer patients and their families through emotional , educational and financial support.  413-092-1616 ? [place] DSS Where to apply for food stamps, Medicaid and utility assistance. ([phone] ? RCATS: Transportation to medical appointments. 7034505897 ? Social Security Administration: May apply for disability if have a Stage IV cancer. [phone] ? [employer], Disability and Transit Services: Assists with nutrition, care and transit needs. [place] Support Programs: @10RELATIVEDAYS @ > Cancer Support Group  2nd Tuesday of the month 1pm-2pm, Journey Room  > Creative Journey  3rd Tuesday of the month 1130am-1pm, Journey Room  > Look Good Feel Better  1st Wednesday of the month 10am-12 noon, Journey Room (Call [place] to register [phone])

## 2017-04-14 NOTE — Progress Notes (Signed)
Labs drawn, medicine given per orders. Will redraw bloodwork at 64min from administration.  Labs drawn per protocol.  Vitals stable and discharged home from clinic ambulatory.follow up as scheduled.

## 2017-04-15 ENCOUNTER — Ambulatory Visit (HOSPITAL_COMMUNITY): Payer: Managed Care, Other (non HMO)

## 2017-04-18 ENCOUNTER — Encounter (HOSPITAL_BASED_OUTPATIENT_CLINIC_OR_DEPARTMENT_OTHER): Payer: Managed Care, Other (non HMO)

## 2017-04-18 ENCOUNTER — Other Ambulatory Visit (HOSPITAL_COMMUNITY): Payer: Self-pay | Admitting: Oncology

## 2017-04-18 ENCOUNTER — Encounter (HOSPITAL_COMMUNITY): Payer: Managed Care, Other (non HMO) | Attending: Hematology & Oncology | Admitting: Oncology

## 2017-04-18 VITALS — BP 140/78 | HR 79 | Temp 98.4°F | Resp 18

## 2017-04-18 DIAGNOSIS — C801 Malignant (primary) neoplasm, unspecified: Secondary | ICD-10-CM

## 2017-04-18 DIAGNOSIS — Z720 Tobacco use: Secondary | ICD-10-CM

## 2017-04-18 DIAGNOSIS — C3491 Malignant neoplasm of unspecified part of right bronchus or lung: Secondary | ICD-10-CM | POA: Diagnosis not present

## 2017-04-18 DIAGNOSIS — Z5111 Encounter for antineoplastic chemotherapy: Secondary | ICD-10-CM

## 2017-04-18 DIAGNOSIS — E222 Syndrome of inappropriate secretion of antidiuretic hormone: Secondary | ICD-10-CM

## 2017-04-18 DIAGNOSIS — E871 Hypo-osmolality and hyponatremia: Secondary | ICD-10-CM

## 2017-04-18 LAB — CBC WITH DIFFERENTIAL/PLATELET
BASOS ABS: 0 10*3/uL (ref 0.0–0.1)
BASOS PCT: 0 %
EOS ABS: 0 10*3/uL (ref 0.0–0.7)
EOS PCT: 0 %
HCT: 29.3 % — ABNORMAL LOW (ref 36.0–46.0)
HEMOGLOBIN: 10 g/dL — AB (ref 12.0–15.0)
LYMPHS ABS: 0.8 10*3/uL (ref 0.7–4.0)
Lymphocytes Relative: 15 %
MCH: 30.8 pg (ref 26.0–34.0)
MCHC: 34.1 g/dL (ref 30.0–36.0)
MCV: 90.2 fL (ref 78.0–100.0)
Monocytes Absolute: 0.7 10*3/uL (ref 0.1–1.0)
Monocytes Relative: 14 %
Neutro Abs: 3.6 10*3/uL (ref 1.7–7.7)
Neutrophils Relative %: 71 %
PLATELETS: 340 10*3/uL (ref 150–400)
RBC: 3.25 MIL/uL — AB (ref 3.87–5.11)
RDW: 18.8 % — ABNORMAL HIGH (ref 11.5–15.5)
WBC: 5.1 10*3/uL (ref 4.0–10.5)

## 2017-04-18 LAB — COMPREHENSIVE METABOLIC PANEL
ALBUMIN: 4 g/dL (ref 3.5–5.0)
ALK PHOS: 74 U/L (ref 38–126)
ALT: 37 U/L (ref 14–54)
AST: 38 U/L (ref 15–41)
Anion gap: 10 (ref 5–15)
BUN: 16 mg/dL (ref 6–20)
CALCIUM: 9.1 mg/dL (ref 8.9–10.3)
CHLORIDE: 89 mmol/L — AB (ref 101–111)
CO2: 27 mmol/L (ref 22–32)
CREATININE: 0.93 mg/dL (ref 0.44–1.00)
GFR calc non Af Amer: >60 mL/min (ref >60)
GLUCOSE: 127 mg/dL — AB (ref 65–99)
Potassium: 3.6 mmol/L (ref 3.5–5.1)
SODIUM: 126 mmol/L — AB (ref 135–145)
Total Bilirubin: 0.8 mg/dL (ref 0.3–1.2)
Total Protein: 6.8 g/dL (ref 6.5–8.1)

## 2017-04-18 MED ORDER — SODIUM CHLORIDE 1 G PO TABS: 1.0000 g | ORAL_TABLET | Freq: Three times a day (TID) | ORAL | 0 refills | Status: DC

## 2017-04-18 MED ORDER — PROCHLORPERAZINE MALEATE 10 MG PO TABS
10.0000 mg | ORAL_TABLET | Freq: Once | ORAL | Status: AC
  Administered 2017-04-18: 10 mg via ORAL

## 2017-04-18 MED ORDER — SODIUM CHLORIDE 0.9 % IV SOLN
Freq: Once | INTRAVENOUS | Status: AC
  Administered 2017-04-18: 12:00:00 via INTRAVENOUS

## 2017-04-18 MED ORDER — PROCHLORPERAZINE MALEATE 10 MG PO TABS
ORAL_TABLET | ORAL | Status: AC
  Filled 2017-04-18: qty 1

## 2017-04-18 MED ORDER — HEPARIN SOD (PORK) LOCK FLUSH 100 UNIT/ML IV SOLN
500.0000 [IU] | Freq: Once | INTRAVENOUS | Status: AC
  Administered 2017-04-18: 500 [IU] via INTRAVENOUS

## 2017-04-18 MED ORDER — SODIUM CHLORIDE 0.9% FLUSH
10.0000 mL | INTRAVENOUS | Status: DC | PRN
  Administered 2017-04-18: 10 mL
  Filled 2017-04-18: qty 10

## 2017-04-18 MED ORDER — TOPOTECAN HCL CHEMO INJECTION 4 MG
1.5000 mg/m2 | Freq: Once | INTRAVENOUS | Status: AC
  Administered 2017-04-18: 2.6 mg via INTRAVENOUS
  Filled 2017-04-18: qty 2.6

## 2017-04-18 NOTE — Patient Instructions (Signed)
[place] at [facility] Discharge Instructions  RECOMMENDATIONS MADE BY THE CONSULTANT AND ANY TEST RESULTS WILL BE SENT TO YOUR REFERRING PHYSICIAN.  You were seen today by Dr. Talbert Cage.   Thank you for choosing [place] at [facility] to provide your oncology and hematology care.  To afford each patient quality time with our provider, please arrive at least 15 minutes before your scheduled appointment time.    If you have a lab appointment with the [place] please come in thru the  Main Entrance and check in at the main information desk  You need to re-schedule your appointment should you arrive 10 or more minutes late.  We strive to give you quality time with our providers, and arriving late affects you and other patients whose appointments are after yours.  Also, if you no show three or more times for appointments you may be dismissed from the clinic at the providers discretion.     Again, thank you for choosing [facility].  Our hope is that these requests will decrease the amount of time that you wait before being seen by our physicians.       _____________________________________________________________  Should you have questions after your visit to [facility], please contact our office at [phone] between the hours of 8:30 a.m. and 4:30 p.m.  Voicemails left after 4:30 p.m. will not be returned until the following business day.  For prescription refill requests, have your pharmacy contact our office.       Resources For Cancer Patients and their Caregivers ? American Cancer Society: Can assist with transportation, wigs, general needs, runs Look Good Feel Better.        848-661-9126 ? Cancer Care: Provides financial assistance, online support groups, medication/co-pay assistance.  1-800-813-HOPE (747)290-0186) ? [place] Assists [place] cancer patients and their families  through emotional , educational and financial support.  914-755-1086 ? [place] DSS Where to apply for food stamps, Medicaid and utility assistance. (908)332-4605 ? RCATS: Transportation to medical appointments. 4358012356 ? Social Security Administration: May apply for disability if have a Stage IV cancer. 702-264-2289 979-730-8532 ? [employer], Disability and Transit Services: Assists with nutrition, care and transit needs. [place] Support Programs: @10RELATIVEDAYS @ > Cancer Support Group  2nd Tuesday of the month 1pm-2pm, Journey Room  > Creative Journey  3rd Tuesday of the month 1130am-1pm, Journey Room  > Look Good Feel Better  1st Wednesday of the month 10am-12 noon, Journey Room (Call [place] to register [phone])

## 2017-04-18 NOTE — Patient Instructions (Signed)
[facility] Discharge Instructions for Patients Receiving Chemotherapy   Beginning January 23rd 2017 lab work for the Cancer Center will be done in the  Main lab at [place] on 1st floor. If you have a lab appointment with the Cancer Center please come in thru the  Main Entrance and check in at the main information desk   Today you received the following chemotherapy agents   To help prevent nausea and vomiting after your treatment, we encourage you to take your nausea medication     If you develop nausea and vomiting, or diarrhea that is not controlled by your medication, call the clinic.  The clinic phone number is [phone]. Office hours are Monday-Friday 8:30am-5:00pm.  BELOW ARE SYMPTOMS THAT SHOULD BE REPORTED IMMEDIATELY:  *FEVER GREATER THAN 101.0 F  *CHILLS WITH OR WITHOUT FEVER  NAUSEA AND VOMITING THAT IS NOT CONTROLLED WITH YOUR NAUSEA MEDICATION  *UNUSUAL SHORTNESS OF BREATH  *UNUSUAL BRUISING OR BLEEDING  TENDERNESS IN MOUTH AND THROAT WITH OR WITHOUT PRESENCE OF ULCERS  *URINARY PROBLEMS  *BOWEL PROBLEMS  UNUSUAL RASH Items with * indicate a potential emergency and should be followed up as soon as possible. If you have an emergency after office hours please contact your primary care physician or go to the nearest emergency department.  Please call the clinic during office hours if you have any questions or concerns.   You may also contact the Patient Navigator at [phone] should you have any questions or need assistance in obtaining follow up care.      Resources For Cancer Patients and their Caregivers ? American Cancer Society: Can assist with transportation, wigs, general needs, runs Look Good Feel Better.        1-888-227-6333 ? Cancer Care: Provides financial assistance, online support groups, medication/co-pay assistance.  1-800-813-HOPE (4673) ? [facility] Assists [place] cancer  patients and their families through emotional , educational and financial support.  336-427-4357 ? [place] DSS Where to apply for food stamps, Medicaid and utility assistance. 336-342-1394 ? RCATS: Transportation to medical appointments. 336-347-2287 ? Social Security Administration: May apply for disability if have a Stage IV cancer. 336-342-7796 1-800-772-1213 ? [place] Aging, Disability and Transit Services: Assists with nutrition, care and transit needs. [phone]         

## 2017-04-18 NOTE — Progress Notes (Signed)
Practice, [facility] [address]  No diagnosis found.  CURRENT THERAPY: Salvage Topotecan therapy beginning on 02/28/2017.  INTERVAL HISTORY: Lydia Moore 56 y.o. female returns for followup of small cell lung cancer right lung, limited stage, at time of diagnosis, presenting with SIADH.  S/P 6 cycles of cisplatin/etoposide (07/27/2016-11/11/2016) curative intent.  She was evaluated by radiation oncology for involved field XRT during chemotherapy, but due to tumor burden, she was deemed not an XRT candidate.  She refused whole brain XRT in the prophylactic setting following systemic chemotherapy.  Short interval relapse of disease less than 6 months on restaging CT scans on 02/21/2017 leading to salvage treatment with Topotecan for recurrent rash extensive stage small cell lung cancer.  S/P second opinion at [facility] with Dr. Lissa Morales on 03/10/2017.    Small cell carcinoma ([place])   07/12/2016 Imaging    CTA chest 3.9 x 3.5 cm right lower lobe mass with imaging features most compatible with a primary lung carcinoma.Marked metastatic right hilar and mediastinal adenopathy including a confluent mass of adenopathy in the right hilum, encasing and causing marked narrowing of the central pulmonary arteries on the right with a short segment of occlusion of the right lower lobe bronchus.Mild postobstructive changes in the inferior, medial right lower lobe.Mild changes of COPD       07/16/2016 Procedure    Endoscopic bronchoscopy with transbronchial biopsy of #7 nodes and biopsy of RLL lung mass      07/16/2016 Pathology Results    Lung, biopsy, Right Lower Lobe - SMALL CELL CARCINOMA.       07/21/2016 Imaging    MRI brain Negative MRI of the brain. No evidence for metastatic disease to the brain or meninges.      07/26/2016 PET scan    Markedly hypermetabolic right lower lobe lesion with hypermetabolic metastatic disease in the right hilum  and mediastinum. 2. Several scattered hypermetabolic ground-glass opacities in the right upper lobe. FDG accumulation within these nodules is higher than typically seen for infectious/inflammatory etiology although this remains within the differential. Atypical appearance of metastatic spread would also be a consideration. 3. Coronary artery atherosclerosis. 4. **An incidental finding of potential clinical significance has been found. 3.8 cm abdominal aortic aneurysm       07/27/2016 - 11/11/2016 Chemotherapy    Cisplatin/Etoposide x 6 cycles      11/09/2016 Treatment Plan Change    Patient declined/refused whole brain radiation.      11/23/2016 Imaging    CT chest with No new or progressive findings in the chest. 2. Further interval decrease in size of mediastinal lymphadenopathy. 3. Only trace scarring visible in the right lower lobe, at the location of the previous right lower lobe mass. 4. Coronary artery and thoracoabdominal aortic atherosclerosis      02/21/2017 Imaging    CT CAP- 1. Unfortunately the patient has had significant recurrence with marked mediastinal and right infrahilar adenopathy as well as suspected masses in the collapsed right lower lobe. The confluent adenopathy completely occlude the right lower lobe and right middle lobe bronchi, although there is some air trapping in the right middle lobe ; the right lower lobe is completely collapsed around several right lower lobe masses. 2. There is some speckled density in the left anterior sixth rib which is not entirely specific but which is concerning for early osseous metastatic disease given that it was not present on 11/23/2016. Consider a bone  scan to further assess the skeleton for other occult bony spread. 3. Infrarenal abdominal aortic aneurysm 4.2 cm in diameter, increased from the prior measurement of 3.8 cm on 07/26/2016. Although this is only minor increase, it has occurred in a 7 month period. The  patient has a previous existing relationship of cardiothoracic surgery and surveillance of this aneurysm by surgery is appropriate. 4. Other imaging findings of potential clinical significance: Coronary, aortic arch, and branch vessel atherosclerotic vascular disease. Mild lumbar spondylosis and degenerative disc disease.      02/22/2017 Relapse/Recurrence    Last Treatment Date: 11/11/2016 Recent Lab Values: Recent Lab Values: N/A      02/28/2017 -  Chemotherapy    Topotecan       03/08/2017 Imaging    MRI brain- 1. No evidence of metastatic disease. 2. Intermittently motion degraded.      03/08/2017 Imaging    Bone scan: IMPRESSION: No evidence skeletal metastasis by bone scintigraphy.      03/10/2017 Miscellaneous    Consult at [facility] with Dr. Aniceto Boss:  Following Topotecan therapy, she may be a candidate for immunotherapy at time of progression/relapse, with Nivolumab versus Nivolumab/Ipilumumab.  Response rates with single-agent immunotherapy is ~ 10% and with combo therapy 20-30%.  Responses are durable.  Rate of side effects from therapy are ~ 5% and 20-30%.  This therapy is not yet FDA approved and therefore if this therapy option is approved, prior approval from insurance company will be needed.  Dr. Aniceto Boss favors the combination therapy.  Additionally, she may be eligible for a clinical trial (ie Rova-T + nivolumab/ipilumumab).      04/07/2017 Imaging    CT CAP- 1. Persistent but improved bulky mediastinal and right hilar/infrahilar lymphadenopathy and significant decrease in size of the right lower lobe mass and obstructive pneumonitis. Residual branching endobronchial tumor in the right lower lobe. 2. No findings for pulmonary metastatic nodules or abdominal/pelvic lymphadenopathy. 3. Stable infrarenal abdominal aortic aneurysm.      04/12/2017 Treatment Plan Change    Tx deferred x 1 week due to hyponatremia requiring work-up.       Patient presents for  follow-up today. She has been taking her sodium tablets. Her sodiums improved up to 126 today. She denies any seizures at home. She states she has a headache for the past 2 days, she feels pressure behind her right eye, she thinks this is related to sinus issues. Otherwise she feels good and has no complaints.   Review of Systems  Constitutional: Negative.  Negative for chills, fever and weight loss.  HENT: Negative.  Negative for hearing loss, sore throat and tinnitus.   Eyes: Negative.  Negative for blurred vision, photophobia and discharge.  Respiratory: Negative.  Negative for cough, hemoptysis, shortness of breath and wheezing.   Cardiovascular: Negative.  Negative for chest pain, palpitations, orthopnea, claudication and leg swelling.  Gastrointestinal: Negative.  Negative for abdominal pain, blood in stool, constipation, diarrhea, melena, nausea and vomiting.  Genitourinary: Negative.  Negative for dysuria and hematuria.  Musculoskeletal: Negative.  Negative for back pain, joint pain and myalgias.  Skin: Negative.  Negative for itching and rash.  Neurological: Positive for headaches. Negative for dizziness, tremors, sensory change, speech change, focal weakness, seizures, loss of consciousness and weakness.  Endo/Heme/Allergies: Negative.  Negative for environmental allergies and polydipsia. Does not bruise/bleed easily.  Psychiatric/Behavioral: Negative.  Negative for depression. The patient is not nervous/anxious and does not have insomnia.     Past Medical History:  Diagnosis  Date  . Elevated cholesterol   . GERD (gastroesophageal reflux disease)   . GI bleed   . Goals of care, counseling/discussion 03/21/2017  . Hypertension   . 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 Small cell carcinoma ([place]) 07/21/2016    Past Surgical History:  Procedure Laterality Date  . ABDOMINAL HYSTERECTOMY     partial  . CESAREAN SECTION    . COLONOSCOPY    . PORTACATH  PLACEMENT Left 08/04/2016   Procedure: INSERTION PORT-A-CATH LEFT SUBCLAVIAN;  Surgeon: Aviva Signs, MD;  Location: [facility];  Service: General;  Laterality: Left;  . TONSILLECTOMY    . VIDEO BRONCHOSCOPY WITH ENDOBRONCHIAL ULTRASOUND N/A 07/16/2016   Procedure: VIDEO BRONCHOSCOPY WITH ENDOBRONCHIAL ULTRASOUND with ultrasound transbronchial biopsy of node #7 and right lower lobe lesion;  Surgeon: Grace Isaac, MD;  Location: [facility];  Service: Thoracic;  Laterality: N/A;    Family History  Problem Relation Age of Onset  . Heart disease Mother   . Heart disease Father   . COPD Sister   . HIV Brother     Social History   Social History  . Marital status: Married    Spouse name: N/A  . Number of children: 4  . Years of education: N/A   Occupational History  . [employer]   Social History Main Topics  . Smoking status: Current Every Day Smoker    Packs/day: 0.50    Years: 35.00    Types: Cigarettes  . Smokeless tobacco: Never Used  . Alcohol use Yes     Comment: occasionally, Couple drinks per mo  . Drug use: Yes    Frequency: 1.0 time per week    Types: Marijuana     Comment: weekly  . Sexual activity: Yes    Birth control/ protection: Surgical     Comment: married   Other Topics Concern  . Not on file   Social History Narrative   Lives w/ husband     PHYSICAL EXAMINATION  ECOG PERFORMANCE STATUS: 0 - Asymptomatic  There were no vitals filed for this visit.  Vitals - 1 value per visit 1/44/3154  SYSTOLIC 008  DIASTOLIC 74  Pulse 74  Temperature 97.9  Respirations 18  Weight (lb) 142.6    Physical Exam  Constitutional: She is oriented to person, place, and time and well-developed, well-nourished, and in no distress. No distress.  HENT:  Head: Normocephalic and atraumatic.  Mouth/Throat: No oropharyngeal exudate.  Eyes: Conjunctivae are normal. Pupils are equal, round, and reactive to light. No scleral icterus.  Neck: Normal range of motion.  Neck supple. No JVD present.  Cardiovascular: Normal rate, regular rhythm and normal heart sounds.  Exam reveals no gallop and no friction rub.   No murmur heard. Pulmonary/Chest: Breath sounds normal. No respiratory distress. She has no wheezes. She has no rales.  Abdominal: Soft. Bowel sounds are normal. She exhibits no distension. There is no tenderness. There is no guarding.  Musculoskeletal: She exhibits no edema or tenderness.  Lymphadenopathy:    She has no cervical adenopathy.  Neurological: She is alert and oriented to person, place, and time. No cranial nerve deficit.  Skin: Skin is warm and dry. No rash noted. No erythema. No pallor.  Psychiatric: Affect and judgment normal.     LABORATORY DATA: CBC    Component Value Date/Time   WBC 5.1 04/18/2017 1106   RBC 3.25 (L) 04/18/2017 1106   HGB 10.0 (L) 04/18/2017 1106  HCT 29.3 (L) 04/18/2017 1106   PLT 340 04/18/2017 1106   MCV 90.2 04/18/2017 1106   MCH 30.8 04/18/2017 1106   MCHC 34.1 04/18/2017 1106   RDW 18.8 (H) 04/18/2017 1106   LYMPHSABS 0.8 04/18/2017 1106   MONOABS 0.7 04/18/2017 1106   EOSABS 0.0 04/18/2017 1106   BASOSABS 0.0 04/18/2017 1106      Chemistry      Component Value Date/Time   NA 126 (L) 04/18/2017 1106   K 3.6 04/18/2017 1106   CL 89 (L) 04/18/2017 1106   CO2 27 04/18/2017 1106   BUN 16 04/18/2017 1106   CREATININE 0.93 04/18/2017 1106      Component Value Date/Time   CALCIUM 9.1 04/18/2017 1106   ALKPHOS 74 04/18/2017 1106   AST 38 04/18/2017 1106   ALT 37 04/18/2017 1106   BILITOT 0.8 04/18/2017 1106        PENDING LABS:   RADIOGRAPHIC STUDIES:  Ct Chest W Contrast  Result Date: 04/07/2017 CLINICAL DATA:  Restaging small cell lung cancer. Status post chemotherapy. EXAM: CT CHEST, ABDOMEN, AND PELVIS WITH CONTRAST TECHNIQUE: Multidetector CT imaging of the chest, abdomen and pelvis was performed following the standard protocol during bolus administration of intravenous  contrast. CONTRAST:  168mL ISOVUE-300 IOPAMIDOL (ISOVUE-300) INJECTION 61% COMPARISON:  CT 02/21/2017 FINDINGS: CT CHEST FINDINGS Cardiovascular: The heart is normal in size. No pericardial effusion. Stable tortuosity, ectasia and calcification of the thoracic aorta without focal aneurysm or dissection. Stable three-vessel coronary artery calcifications. Mediastinum/Nodes: Persistent bulky put slightly improved mediastinal lymphadenopathy. Pretracheal nodal mass at the level of the aortic arch previously measured 4.8 x 4.2 cm and now measures 3.1 x 3.1 cm. Bulky subcarinal tumor previously measured 5.5 x 5.2 cm and now measures 4.0 x 3.6 cm. Large right infrahilar mass and adenopathy measures approximately 6 x 5.3 cm and previously measured 7.4 x 7.0 cm. Lungs/Pleura: Branching right lower lobe disease is likely endobronchial tumor and measures a maximum of 2.7 cm. This was obscured by extensive tumor and obstructive pneumonia on the prior CT. Re- aeration of the right lower lobe is noted. There is still fairly significant compression of the right lower lobe bronchus. No definite metastatic pulmonary nodules. No acute overlying pulmonary process. No malignant pleural effusion. Musculoskeletal: No significant bony findings. CT ABDOMEN PELVIS FINDINGS Hepatobiliary: No evidence of hepatic metastatic disease. Gallbladder is normal. No common bile duct dilatation. Pancreas: No mass, inflammation or ductal dilatation. Spleen: Normal size.  No focal lesions. Adrenals/Urinary Tract: Stable small nodules involving the mid left adrenal gland. The right adrenal gland is normal. No renal abnormalities. Stomach/Bowel: The stomach, duodenum, small bowel and colon are unremarkable. No inflammatory changes, mass lesions or obstructive findings. The terminal ileum is normal. The appendix is normal. Vascular/Lymphatic: Stable tortuosity and ectasia of the abdominal aorta with stable infrarenal abdominal aortic aneurysm and moderate  mural thrombus. Maximal measurements are 4.3 x 3.8 cm on image number 71. Stable extensive iliac artery calcifications. No mesenteric or retroperitoneal mass or lymphadenopathy. There are small scattered lymph nodes which are stable. Reproductive: Surgically absent. Other: No pelvic mass or adenopathy. No free pelvic fluid collections. No inguinal mass or adenopathy. No abdominal wall hernia or subcutaneous lesions. Musculoskeletal: No significant bony findings. Mild osteoporosis for age. No worrisome bone lesions. IMPRESSION: 1. Persistent but improved bulky mediastinal and right hilar/infrahilar lymphadenopathy and significant decrease in size of the right lower lobe mass and obstructive pneumonitis. Residual branching endobronchial tumor in the right lower lobe.  2. No findings for pulmonary metastatic nodules or abdominal/pelvic lymphadenopathy. 3. Stable infrarenal abdominal aortic aneurysm. Electronically Signed   By: Marijo Sanes M.D.   On: 04/07/2017 15:47   Ct Abdomen Pelvis W Contrast  Result Date: 04/07/2017 CLINICAL DATA:  Restaging small cell lung cancer. Status post chemotherapy. EXAM: CT CHEST, ABDOMEN, AND PELVIS WITH CONTRAST TECHNIQUE: Multidetector CT imaging of the chest, abdomen and pelvis was performed following the standard protocol during bolus administration of intravenous contrast. CONTRAST:  12mL ISOVUE-300 IOPAMIDOL (ISOVUE-300) INJECTION 61% COMPARISON:  CT 02/21/2017 FINDINGS: CT CHEST FINDINGS Cardiovascular: The heart is normal in size. No pericardial effusion. Stable tortuosity, ectasia and calcification of the thoracic aorta without focal aneurysm or dissection. Stable three-vessel coronary artery calcifications. Mediastinum/Nodes: Persistent bulky put slightly improved mediastinal lymphadenopathy. Pretracheal nodal mass at the level of the aortic arch previously measured 4.8 x 4.2 cm and now measures 3.1 x 3.1 cm. Bulky subcarinal tumor previously measured 5.5 x 5.2 cm and now  measures 4.0 x 3.6 cm. Large right infrahilar mass and adenopathy measures approximately 6 x 5.3 cm and previously measured 7.4 x 7.0 cm. Lungs/Pleura: Branching right lower lobe disease is likely endobronchial tumor and measures a maximum of 2.7 cm. This was obscured by extensive tumor and obstructive pneumonia on the prior CT. Re- aeration of the right lower lobe is noted. There is still fairly significant compression of the right lower lobe bronchus. No definite metastatic pulmonary nodules. No acute overlying pulmonary process. No malignant pleural effusion. Musculoskeletal: No significant bony findings. CT ABDOMEN PELVIS FINDINGS Hepatobiliary: No evidence of hepatic metastatic disease. Gallbladder is normal. No common bile duct dilatation. Pancreas: No mass, inflammation or ductal dilatation. Spleen: Normal size.  No focal lesions. Adrenals/Urinary Tract: Stable small nodules involving the mid left adrenal gland. The right adrenal gland is normal. No renal abnormalities. Stomach/Bowel: The stomach, duodenum, small bowel and colon are unremarkable. No inflammatory changes, mass lesions or obstructive findings. The terminal ileum is normal. The appendix is normal. Vascular/Lymphatic: Stable tortuosity and ectasia of the abdominal aorta with stable infrarenal abdominal aortic aneurysm and moderate mural thrombus. Maximal measurements are 4.3 x 3.8 cm on image number 71. Stable extensive iliac artery calcifications. No mesenteric or retroperitoneal mass or lymphadenopathy. There are small scattered lymph nodes which are stable. Reproductive: Surgically absent. Other: No pelvic mass or adenopathy. No free pelvic fluid collections. No inguinal mass or adenopathy. No abdominal wall hernia or subcutaneous lesions. Musculoskeletal: No significant bony findings. Mild osteoporosis for age. No worrisome bone lesions. IMPRESSION: 1. Persistent but improved bulky mediastinal and right hilar/infrahilar lymphadenopathy and  significant decrease in size of the right lower lobe mass and obstructive pneumonitis. Residual branching endobronchial tumor in the right lower lobe. 2. No findings for pulmonary metastatic nodules or abdominal/pelvic lymphadenopathy. 3. Stable infrarenal abdominal aortic aneurysm. Electronically Signed   By: Marijo Sanes M.D.   On: 04/07/2017 15:47   Mm Digital Screening Bilateral  Result Date: 04/13/2017 CLINICAL DATA:  Screening. EXAM: DIGITAL SCREENING BILATERAL MAMMOGRAM WITH CAD COMPARISON:  Previous exam(s). ACR Breast Density Category b: There are scattered areas of fibroglandular density. FINDINGS: There are no findings suspicious for malignancy. Images were processed with CAD. IMPRESSION: No mammographic evidence of malignancy. A result letter of this screening mammogram will be mailed directly to the patient. RECOMMENDATION: Screening mammogram in one year. (Code:SM-B-01Y) BI-RADS CATEGORY  1: Negative. Electronically Signed   By: Evangeline Dakin M.D.   On: 04/13/2017 17:20     PATHOLOGY:  ASSESSMENT:  1. 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  S/P 6 cycles of Cisplatin/Etoposide (07/27/2016- 11/11/2016) with curative intent.  She was evaluated by radiation oncology for involved field XRT during chemotherapy, but due to tumor burden, she was deemed not an XRT candidate.  She refused whole brain XRT in the prophylactic setting following systemic chemotherapy.  Short interval relapse of disease <6 months on restaging CT imaging on 02/21/2017 leading to salvage treatment with Topotecan for recurrent/extensive stage SCLC.  S/P second opinion at [facility] with Dr. Lissa Morales on 03/10/2017.  -Continue chemotherapy as scheduled; she will get cycle 3 of topotecan today. Reviewed her labs.  -Recent scans showed a response to topotecan.  2. 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  Treatment of this is to treat the underlying disease. - Patient is asymptomatic. - Sodium improved to 126 today. Plan to check sodium levels every 2 weeks for now until it is greater than 130. - Continue sodium tablets. - I have advised patient to fluid restrict to less than a liter of fluids daily. - Encouraged patient to increase salt intake. - Potentially can add a loop diuretic with lasix 20mg  PO BID if worsening sodium level on fluid restriction and sodium tablets.  - if patient should become symptomatic in the future from hyponatremia, such as developing seizures, then she can be placed on tolvaptan 15 mg PO daily.  RTC in 4 weeks for follow up.          THERAPY PLAN:  Topotecan  All questions were answered. The patient knows to call the clinic with any problems, questions or concerns. We can certainly see the patient much sooner if necessary.  Patient and plan discussed with Dr. Twana First and she is in agreement with the aforementioned.   This note is electronically signed by: Twana First, MD 04/18/2017 12:15 PM

## 2017-04-18 NOTE — Progress Notes (Signed)
Labs reviewed , proceed with treatment.  Chemotherapy given today per orders. Patient tolerated it well without problems. Vitals stable and discharged home from clinic ambulatory. Follow up as scheduled.

## 2017-04-19 ENCOUNTER — Encounter (HOSPITAL_BASED_OUTPATIENT_CLINIC_OR_DEPARTMENT_OTHER): Payer: Managed Care, Other (non HMO)

## 2017-04-19 VITALS — BP 133/82 | HR 71 | Temp 98.0°F | Resp 18 | Wt 136.2 lb

## 2017-04-19 DIAGNOSIS — Z5111 Encounter for antineoplastic chemotherapy: Secondary | ICD-10-CM | POA: Diagnosis not present

## 2017-04-19 DIAGNOSIS — C3491 Malignant neoplasm of unspecified part of right bronchus or lung: Secondary | ICD-10-CM

## 2017-04-19 DIAGNOSIS — C801 Malignant (primary) neoplasm, unspecified: Secondary | ICD-10-CM

## 2017-04-19 MED ORDER — TOPOTECAN HCL CHEMO INJECTION 4 MG
1.5000 mg/m2 | Freq: Once | INTRAVENOUS | Status: AC
  Administered 2017-04-19: 2.6 mg via INTRAVENOUS
  Filled 2017-04-19: qty 2.6

## 2017-04-19 MED ORDER — PROCHLORPERAZINE MALEATE 10 MG PO TABS
10.0000 mg | ORAL_TABLET | Freq: Once | ORAL | Status: AC
Start: 2017-04-19 — End: 2017-04-19
  Administered 2017-04-19: 10 mg via ORAL
  Filled 2017-04-19: qty 1

## 2017-04-19 MED ORDER — SODIUM CHLORIDE 0.9 % IV SOLN
Freq: Once | INTRAVENOUS | Status: AC
  Administered 2017-04-19: 12:00:00 via INTRAVENOUS

## 2017-04-19 MED ORDER — HEPARIN SOD (PORK) LOCK FLUSH 100 UNIT/ML IV SOLN
500.0000 [IU] | Freq: Once | INTRAVENOUS | Status: AC | PRN
  Administered 2017-04-19: 500 [IU]
  Filled 2017-04-19: qty 5

## 2017-04-19 NOTE — Patient Instructions (Signed)
[facility] Discharge Instructions for Patients Receiving Chemotherapy   Beginning January 23rd 2017 lab work for the [facility] will be done in the  Main lab at [facility] on 1st floor. If you have a lab appointment with the [facility] please come in thru the  Main Entrance and check in at the main information desk   Today you received the following chemotherapy agents topotecan.  If you develop nausea and vomiting, or diarrhea that is not controlled by your medication, call the clinic.  The clinic phone number is [phone]. Office hours are Monday-Friday 8:30am-5:00pm.  BELOW ARE SYMPTOMS THAT SHOULD BE REPORTED IMMEDIATELY:  *FEVER GREATER THAN 101.0 F  *CHILLS WITH OR WITHOUT FEVER  NAUSEA AND VOMITING THAT IS NOT CONTROLLED WITH YOUR NAUSEA MEDICATION  *UNUSUAL SHORTNESS OF BREATH  *UNUSUAL BRUISING OR BLEEDING  TENDERNESS IN MOUTH AND THROAT WITH OR WITHOUT PRESENCE OF ULCERS  *URINARY PROBLEMS  *BOWEL PROBLEMS  UNUSUAL RASH Items with * indicate a potential emergency and should be followed up as soon as possible. If you have an emergency after office hours please contact your primary care physician or go to the nearest emergency department.  Please call the clinic during office hours if you have any questions or concerns.   You may also contact the Patient Navigator at [phone] should you have any questions or need assistance in obtaining follow up care.      Resources For Cancer Patients and their Caregivers ? American Cancer Society: Can assist with transportation, wigs, general needs, runs Look Good Feel Better.        218-023-1711 ? Cancer Care: Provides financial assistance, online support groups, medication/co-pay assistance.  1-800-813-HOPE 406-376-3877) ? [facility] Assists [place] cancer patients and their families through emotional , educational and financial support.   4318321335 ? [place] DSS Where to apply for food stamps, Medicaid and utility assistance. 763-130-2982 ? [facility]: Transportation to medical appointments. (402)217-0898 ? Social Security Administration: May apply for disability if have a Stage IV cancer. ([phone] 469-010-8383 ? [employer], Disability and Transit Services: Assists with nutrition, care and transit needs. [phone]

## 2017-04-19 NOTE — Progress Notes (Signed)
Tolerated infusion well. Stable and ambulatory on discharge home to self.

## 2017-04-20 ENCOUNTER — Encounter (HOSPITAL_COMMUNITY): Payer: Managed Care, Other (non HMO) | Attending: Oncology

## 2017-04-20 ENCOUNTER — Ambulatory Visit (HOSPITAL_COMMUNITY): Payer: Managed Care, Other (non HMO)

## 2017-04-20 VITALS — BP 115/71 | HR 74 | Temp 98.7°F | Resp 18

## 2017-04-20 DIAGNOSIS — C3431 Malignant neoplasm of lower lobe, right bronchus or lung: Secondary | ICD-10-CM | POA: Diagnosis not present

## 2017-04-20 DIAGNOSIS — Z5111 Encounter for antineoplastic chemotherapy: Secondary | ICD-10-CM | POA: Diagnosis not present

## 2017-04-20 DIAGNOSIS — C801 Malignant (primary) neoplasm, unspecified: Secondary | ICD-10-CM

## 2017-04-20 MED ORDER — HEPARIN SOD (PORK) LOCK FLUSH 100 UNIT/ML IV SOLN
INTRAVENOUS | Status: AC
  Filled 2017-04-20: qty 5

## 2017-04-20 MED ORDER — PROCHLORPERAZINE MALEATE 10 MG PO TABS
ORAL_TABLET | ORAL | Status: AC
  Filled 2017-04-20: qty 1

## 2017-04-20 MED ORDER — TOPOTECAN HCL CHEMO INJECTION 4 MG
1.5000 mg/m2 | Freq: Once | INTRAVENOUS | Status: AC
  Administered 2017-04-20: 2.6 mg via INTRAVENOUS
  Filled 2017-04-20: qty 2.6

## 2017-04-20 MED ORDER — PROCHLORPERAZINE MALEATE 10 MG PO TABS
10.0000 mg | ORAL_TABLET | Freq: Once | ORAL | Status: AC
  Administered 2017-04-20: 10 mg via ORAL

## 2017-04-20 MED ORDER — HEPARIN SOD (PORK) LOCK FLUSH 100 UNIT/ML IV SOLN
500.0000 [IU] | Freq: Once | INTRAVENOUS | Status: AC | PRN
Start: 2017-04-20 — End: 2017-04-20
  Administered 2017-04-20: 500 [IU]

## 2017-04-20 MED ORDER — SODIUM CHLORIDE 0.9 % IV SOLN
Freq: Once | INTRAVENOUS | Status: AC
  Administered 2017-04-20: 11:00:00 via INTRAVENOUS

## 2017-04-20 NOTE — Progress Notes (Signed)
Patient tolerated infusion today. No complaints or reactions. Patient discharged ambulatory and in stable condition home to self.  Follow as scheduled.

## 2017-04-20 NOTE — Patient Instructions (Signed)
[facility] Discharge Instructions for Patients Receiving Chemotherapy   Beginning January 23rd 2017 lab work for the [facility] will be done in the  Main lab at [facility] on 1st floor. If you have a lab appointment with the [facility] please come in thru the  Main Entrance and check in at the main information desk   Today you received the following chemotherapy agents topotecan.  To help prevent nausea and vomiting after your treatment, we encourage you to take your nausea medication as prescribed.   If you develop nausea and vomiting, or diarrhea that is not controlled by your medication, call the clinic.  The clinic phone number is [phone]. Office hours are Monday-Friday 8:30am-5:00pm.  BELOW ARE SYMPTOMS THAT SHOULD BE REPORTED IMMEDIATELY:  *FEVER GREATER THAN 101.0 F  *CHILLS WITH OR WITHOUT FEVER  NAUSEA AND VOMITING THAT IS NOT CONTROLLED WITH YOUR NAUSEA MEDICATION  *UNUSUAL SHORTNESS OF BREATH  *UNUSUAL BRUISING OR BLEEDING  TENDERNESS IN MOUTH AND THROAT WITH OR WITHOUT PRESENCE OF ULCERS  *URINARY PROBLEMS  *BOWEL PROBLEMS  UNUSUAL RASH Items with * indicate a potential emergency and should be followed up as soon as possible. If you have an emergency after office hours please contact your primary care physician or go to the nearest emergency department.  Please call the clinic during office hours if you have any questions or concerns.   You may also contact the Patient Navigator at [phone] should you have any questions or need assistance in obtaining follow up care.      Resources For Cancer Patients and their Caregivers ? American Cancer Society: Can assist with transportation, wigs, general needs, runs Look Good Feel Better.        703-707-6813 ? Cancer Care: Provides financial assistance, online support groups, medication/co-pay assistance.  1-800-813-HOPE 256-163-9976) ? [place] Assists  [place] cancer patients and their families through emotional , educational and financial support.  470-107-0229 ? [place] DSS Where to apply for food stamps, Medicaid and utility assistance. (225)044-1756 ? RC[employer]: Transportation to medical appointments. 867-213-1442 ? Social Security Administration: May apply for disability if have a Stage IV cancer. [phone] ? [employer], Disability and Transit Services: Assists with nutrition, care and transit needs. [phone]

## 2017-04-21 ENCOUNTER — Encounter (HOSPITAL_COMMUNITY): Payer: Self-pay

## 2017-04-21 ENCOUNTER — Other Ambulatory Visit (HOSPITAL_COMMUNITY): Payer: Self-pay | Admitting: Oncology

## 2017-04-21 ENCOUNTER — Encounter (HOSPITAL_BASED_OUTPATIENT_CLINIC_OR_DEPARTMENT_OTHER): Payer: Managed Care, Other (non HMO)

## 2017-04-21 ENCOUNTER — Ambulatory Visit (HOSPITAL_COMMUNITY): Payer: Managed Care, Other (non HMO)

## 2017-04-21 VITALS — BP 140/78 | HR 74 | Temp 97.9°F | Resp 18 | Wt 136.4 lb

## 2017-04-21 DIAGNOSIS — C3491 Malignant neoplasm of unspecified part of right bronchus or lung: Secondary | ICD-10-CM

## 2017-04-21 DIAGNOSIS — Z5111 Encounter for antineoplastic chemotherapy: Secondary | ICD-10-CM

## 2017-04-21 DIAGNOSIS — C801 Malignant (primary) neoplasm, unspecified: Secondary | ICD-10-CM

## 2017-04-21 MED ORDER — PROCHLORPERAZINE MALEATE 10 MG PO TABS
10.0000 mg | ORAL_TABLET | Freq: Once | ORAL | Status: AC
  Administered 2017-04-21: 10 mg via ORAL

## 2017-04-21 MED ORDER — HEPARIN SOD (PORK) LOCK FLUSH 100 UNIT/ML IV SOLN
INTRAVENOUS | Status: AC
  Filled 2017-04-21: qty 5

## 2017-04-21 MED ORDER — HEPARIN SOD (PORK) LOCK FLUSH 100 UNIT/ML IV SOLN
500.0000 [IU] | Freq: Once | INTRAVENOUS | Status: AC | PRN
  Administered 2017-04-21: 500 [IU]

## 2017-04-21 MED ORDER — TOPOTECAN HCL CHEMO INJECTION 4 MG
1.5000 mg/m2 | Freq: Once | INTRAVENOUS | Status: AC
  Administered 2017-04-21: 2.6 mg via INTRAVENOUS
  Filled 2017-04-21: qty 2.6

## 2017-04-21 MED ORDER — SODIUM CHLORIDE 0.9 % IV SOLN
Freq: Once | INTRAVENOUS | Status: AC
  Administered 2017-04-21: 10:00:00 via INTRAVENOUS

## 2017-04-21 MED ORDER — PROCHLORPERAZINE MALEATE 10 MG PO TABS
ORAL_TABLET | ORAL | Status: AC
  Filled 2017-04-21: qty 1

## 2017-04-21 NOTE — Patient Instructions (Signed)
[facility] Discharge Instructions for Patients Receiving Chemotherapy   Beginning January 23rd 2017 lab work for the [facility] will be done in the  Main lab at [facility] on 1st floor. If you have a lab appointment with the [facility] please come in thru the  Main Entrance and check in at the main information desk   Today you received the following chemotherapy agents topotecan.  To help prevent nausea and vomiting after your treatment, we encourage you to take your nausea medication as prescribed.   If you develop nausea and vomiting, or diarrhea that is not controlled by your medication, call the clinic.  The clinic phone number is [phone]. Office hours are Monday-Friday 8:30am-5:00pm.  BELOW ARE SYMPTOMS THAT SHOULD BE REPORTED IMMEDIATELY:  *FEVER GREATER THAN 101.0 F  *CHILLS WITH OR WITHOUT FEVER  NAUSEA AND VOMITING THAT IS NOT CONTROLLED WITH YOUR NAUSEA MEDICATION  *UNUSUAL SHORTNESS OF BREATH  *UNUSUAL BRUISING OR BLEEDING  TENDERNESS IN MOUTH AND THROAT WITH OR WITHOUT PRESENCE OF ULCERS  *URINARY PROBLEMS  *BOWEL PROBLEMS  UNUSUAL RASH Items with * indicate a potential emergency and should be followed up as soon as possible. If you have an emergency after office hours please contact your primary care physician or go to the nearest emergency department.  Please call the clinic during office hours if you have any questions or concerns.   You may also contact the Patient Navigator at [phone] should you have any questions or need assistance in obtaining follow up care.      Resources For Cancer Patients and their Caregivers ? American Cancer Society: Can assist with transportation, wigs, general needs, runs Look Good Feel Better.        351-009-1894 ? Cancer Care: Provides financial assistance, online support groups, medication/co-pay assistance.  1-800-813-HOPE 254-538-8532) ? [place] Assists  [place] cancer patients and their families through emotional , educational and financial support.  551-595-5485 ? [place] DSS Where to apply for food stamps, Medicaid and utility assistance. 202-340-2220 ? [employer]: Transportation to medical appointments. ([phone] ? Social Security Administration: May apply for disability if have a Stage IV cancer. [phone] 7816452070 ? [employer], Disability and Transit Services: Assists with nutrition, care and transit needs. [phone]

## 2017-04-21 NOTE — Progress Notes (Signed)
Patient tolerated infusion well. No complaints or reactions. Patient discharged stable and ambulatory to self from clinic. Follow up as scheduled.

## 2017-04-22 ENCOUNTER — Ambulatory Visit (HOSPITAL_COMMUNITY): Payer: Managed Care, Other (non HMO)

## 2017-04-22 ENCOUNTER — Encounter (HOSPITAL_BASED_OUTPATIENT_CLINIC_OR_DEPARTMENT_OTHER): Payer: Managed Care, Other (non HMO)

## 2017-04-22 VITALS — BP 136/84 | HR 73 | Temp 98.6°F | Resp 18 | Wt 137.4 lb

## 2017-04-22 DIAGNOSIS — Z5111 Encounter for antineoplastic chemotherapy: Secondary | ICD-10-CM | POA: Diagnosis not present

## 2017-04-22 DIAGNOSIS — C3431 Malignant neoplasm of lower lobe, right bronchus or lung: Secondary | ICD-10-CM

## 2017-04-22 DIAGNOSIS — C801 Malignant (primary) neoplasm, unspecified: Secondary | ICD-10-CM

## 2017-04-22 MED ORDER — SODIUM CHLORIDE 0.9 % IV SOLN
Freq: Once | INTRAVENOUS | Status: AC
  Administered 2017-04-22: 09:00:00 via INTRAVENOUS

## 2017-04-22 MED ORDER — PROCHLORPERAZINE MALEATE 10 MG PO TABS
10.0000 mg | ORAL_TABLET | Freq: Once | ORAL | Status: AC
  Administered 2017-04-22: 10 mg via ORAL
  Filled 2017-04-22: qty 1

## 2017-04-22 MED ORDER — TOPOTECAN HCL CHEMO INJECTION 4 MG
1.5000 mg/m2 | Freq: Once | INTRAVENOUS | Status: AC
  Administered 2017-04-22: 2.6 mg via INTRAVENOUS
  Filled 2017-04-22: qty 2.6

## 2017-04-22 MED ORDER — PEGFILGRASTIM 6 MG/0.6ML ~~LOC~~ PSKT
6.0000 mg | PREFILLED_SYRINGE | Freq: Once | SUBCUTANEOUS | Status: AC
  Administered 2017-04-22: 6 mg via SUBCUTANEOUS
  Filled 2017-04-22: qty 0.6

## 2017-04-22 MED ORDER — HEPARIN SOD (PORK) LOCK FLUSH 100 UNIT/ML IV SOLN
500.0000 [IU] | Freq: Once | INTRAVENOUS | Status: AC | PRN
  Administered 2017-04-22: 500 [IU]
  Filled 2017-04-22: qty 5

## 2017-04-22 NOTE — Progress Notes (Unsigned)
Tolerated tx w/o adverse reaction.  Alert, in no distress.  VSS.  Discharged ambulatory. 

## 2017-04-27 DIAGNOSIS — Z736 Limitation of activities due to disability: Secondary | ICD-10-CM

## 2017-05-02 ENCOUNTER — Ambulatory Visit (HOSPITAL_COMMUNITY): Payer: Managed Care, Other (non HMO)

## 2017-05-03 ENCOUNTER — Ambulatory Visit (HOSPITAL_COMMUNITY): Payer: Managed Care, Other (non HMO)

## 2017-05-04 ENCOUNTER — Ambulatory Visit (HOSPITAL_COMMUNITY): Payer: Managed Care, Other (non HMO)

## 2017-05-05 ENCOUNTER — Ambulatory Visit (HOSPITAL_COMMUNITY): Payer: Managed Care, Other (non HMO)

## 2017-05-06 ENCOUNTER — Ambulatory Visit (HOSPITAL_COMMUNITY): Payer: Managed Care, Other (non HMO)

## 2017-05-09 ENCOUNTER — Encounter (HOSPITAL_BASED_OUTPATIENT_CLINIC_OR_DEPARTMENT_OTHER): Payer: Managed Care, Other (non HMO)

## 2017-05-09 ENCOUNTER — Encounter (HOSPITAL_COMMUNITY): Payer: Self-pay

## 2017-05-09 ENCOUNTER — Encounter (HOSPITAL_BASED_OUTPATIENT_CLINIC_OR_DEPARTMENT_OTHER): Payer: Managed Care, Other (non HMO) | Admitting: Oncology

## 2017-05-09 VITALS — BP 124/73 | HR 70 | Temp 98.3°F | Resp 16 | Ht 65.0 in | Wt 138.0 lb

## 2017-05-09 VITALS — BP 113/71 | HR 69 | Temp 98.5°F | Resp 18

## 2017-05-09 DIAGNOSIS — E222 Syndrome of inappropriate secretion of antidiuretic hormone: Secondary | ICD-10-CM | POA: Diagnosis not present

## 2017-05-09 DIAGNOSIS — Z5111 Encounter for antineoplastic chemotherapy: Secondary | ICD-10-CM

## 2017-05-09 DIAGNOSIS — C3431 Malignant neoplasm of lower lobe, right bronchus or lung: Secondary | ICD-10-CM | POA: Diagnosis not present

## 2017-05-09 DIAGNOSIS — C801 Malignant (primary) neoplasm, unspecified: Secondary | ICD-10-CM

## 2017-05-09 DIAGNOSIS — E871 Hypo-osmolality and hyponatremia: Secondary | ICD-10-CM

## 2017-05-09 LAB — CBC WITH DIFFERENTIAL/PLATELET
BASOS PCT: 0 %
Basophils Absolute: 0 10*3/uL (ref 0.0–0.1)
EOS PCT: 1 %
Eosinophils Absolute: 0.1 10*3/uL (ref 0.0–0.7)
HEMATOCRIT: 28.6 % — AB (ref 36.0–46.0)
Hemoglobin: 9.9 g/dL — ABNORMAL LOW (ref 12.0–15.0)
LYMPHS ABS: 0.8 10*3/uL (ref 0.7–4.0)
Lymphocytes Relative: 14 %
MCH: 32.5 pg (ref 26.0–34.0)
MCHC: 34.6 g/dL (ref 30.0–36.0)
MCV: 93.8 fL (ref 78.0–100.0)
MONO ABS: 0.4 10*3/uL (ref 0.1–1.0)
Monocytes Relative: 7 %
Neutro Abs: 4.7 10*3/uL (ref 1.7–7.7)
Neutrophils Relative %: 78 %
PLATELETS: 279 10*3/uL (ref 150–400)
RBC: 3.05 MIL/uL — AB (ref 3.87–5.11)
RDW: 23 % — AB (ref 11.5–15.5)
WBC: 6 10*3/uL (ref 4.0–10.5)

## 2017-05-09 LAB — COMPREHENSIVE METABOLIC PANEL
ALBUMIN: 4.2 g/dL (ref 3.5–5.0)
ALK PHOS: 100 U/L (ref 38–126)
ALT: 46 U/L (ref 14–54)
ANION GAP: 8 (ref 5–15)
AST: 43 U/L — ABNORMAL HIGH (ref 15–41)
BILIRUBIN TOTAL: 0.6 mg/dL (ref 0.3–1.2)
BUN: 12 mg/dL (ref 6–20)
CALCIUM: 8.8 mg/dL — AB (ref 8.9–10.3)
CO2: 27 mmol/L (ref 22–32)
Chloride: 86 mmol/L — ABNORMAL LOW (ref 101–111)
Creatinine, Ser: 0.82 mg/dL (ref 0.44–1.00)
GFR calc Af Amer: >60 mL/min (ref >60)
GLUCOSE: 142 mg/dL — AB (ref 65–99)
POTASSIUM: 3.4 mmol/L — AB (ref 3.5–5.1)
Sodium: 121 mmol/L — ABNORMAL LOW (ref 135–145)
TOTAL PROTEIN: 6.8 g/dL (ref 6.5–8.1)

## 2017-05-09 LAB — MAGNESIUM: Magnesium: 1.6 mg/dL — ABNORMAL LOW (ref 1.7–2.4)

## 2017-05-09 MED ORDER — HEPARIN SOD (PORK) LOCK FLUSH 100 UNIT/ML IV SOLN
500.0000 [IU] | Freq: Once | INTRAVENOUS | Status: AC | PRN
  Administered 2017-05-09: 500 [IU]
  Filled 2017-05-09: qty 5

## 2017-05-09 MED ORDER — SODIUM CHLORIDE 0.9 % IV SOLN
Freq: Once | INTRAVENOUS | Status: AC
  Administered 2017-05-09: 13:00:00 via INTRAVENOUS

## 2017-05-09 MED ORDER — TOPOTECAN HCL CHEMO INJECTION 4 MG
1.5000 mg/m2 | Freq: Once | INTRAVENOUS | Status: AC
  Administered 2017-05-09: 2.6 mg via INTRAVENOUS
  Filled 2017-05-09: qty 2.6

## 2017-05-09 MED ORDER — PROCHLORPERAZINE MALEATE 10 MG PO TABS
10.0000 mg | ORAL_TABLET | Freq: Once | ORAL | Status: AC
  Administered 2017-05-09: 10 mg via ORAL
  Filled 2017-05-09: qty 1

## 2017-05-09 NOTE — Progress Notes (Signed)
Per Dr.Zhou, instructed patient to increase sodium chloride tablet to 1 gm three times daily with meals and restrict fluids to 1 liter daily. Patient verbalized understanding. Tolerated chemo well. Stable and ambulatory on discharge home to self.

## 2017-05-09 NOTE — Patient Instructions (Signed)
[facility] Discharge Instructions for Patients Receiving Chemotherapy   Beginning January 23rd 2017 lab work for the [facility] will be done in the  Main lab at [facility] on 1st floor. If you have a lab appointment with the [facility] please come in thru the  Main Entrance and check in at the main information desk   Today you received the following chemotherapy agents, topotecan. Increase sodium chloride tablet 1 gm to one three times daily with meals. Fluid restriction of 1 liter per day.  If you develop nausea and vomiting, or diarrhea that is not controlled by your medication, call the clinic.  The clinic phone number is [phone]. Office hours are Monday-Friday 8:30am-5:00pm.  BELOW ARE SYMPTOMS THAT SHOULD BE REPORTED IMMEDIATELY:  *FEVER GREATER THAN 101.0 F  *CHILLS WITH OR WITHOUT FEVER  NAUSEA AND VOMITING THAT IS NOT CONTROLLED WITH YOUR NAUSEA MEDICATION  *UNUSUAL SHORTNESS OF BREATH  *UNUSUAL BRUISING OR BLEEDING  TENDERNESS IN MOUTH AND THROAT WITH OR WITHOUT PRESENCE OF ULCERS  *URINARY PROBLEMS  *BOWEL PROBLEMS  UNUSUAL RASH Items with * indicate a potential emergency and should be followed up as soon as possible. If you have an emergency after office hours please contact your primary care physician or go to the nearest emergency department.  Please call the clinic during office hours if you have any questions or concerns.   You may also contact the Patient Navigator at [phone] should you have any questions or need assistance in obtaining follow up care.      Resources For Cancer Patients and their Caregivers ? American Cancer Society: Can assist with transportation, wigs, general needs, runs Look Good Feel Better.        319-880-8776 ? Cancer Care: Provides financial assistance, online support groups, medication/co-pay assistance.  1-800-813-HOPE 418-006-0822) ? [place] Assists [place]  cancer patients and their families through emotional , educational and financial support.  781-845-2486 ? [place] DSS Where to apply for food stamps, Medicaid and utility assistance. 903-387-7928 ? [employer]: Transportation to medical appointments. 516-750-9502 ? Social Security Administration: May apply for disability if have a Stage IV cancer. [phone] ? [employer], Disability and Transit Services: Assists with nutrition, care and transit needs. [phone]

## 2017-05-09 NOTE — Progress Notes (Signed)
Practice, [facility] [address]  No diagnosis found.  CURRENT THERAPY: Salvage Topotecan therapy beginning on 02/28/2017.  INTERVAL HISTORY: Lydia Moore 56 y.o. female returns for followup of small cell lung cancer right lung, limited stage, at time of diagnosis, presenting with SIADH.  S/P 6 cycles of cisplatin/etoposide (07/27/2016-11/11/2016) curative intent.  She was evaluated by radiation oncology for involved field XRT during chemotherapy, but due to tumor burden, she was deemed not an XRT candidate.  She refused whole brain XRT in the prophylactic setting following systemic chemotherapy.  Short interval relapse of disease less than 6 months on restaging CT scans on 02/21/2017 leading to salvage treatment with Topotecan for recurrent rash extensive stage small cell lung cancer.  S/P second opinion at [facility] with Dr. Lissa Morales on 03/10/2017.    Small cell carcinoma ([place])   07/12/2016 Imaging    CTA chest 3.9 x 3.5 cm right lower lobe mass with imaging features most compatible with a primary lung carcinoma.Marked metastatic right hilar and mediastinal adenopathy including a confluent mass of adenopathy in the right hilum, encasing and causing marked narrowing of the central pulmonary arteries on the right with a short segment of occlusion of the right lower lobe bronchus.Mild postobstructive changes in the inferior, medial right lower lobe.Mild changes of COPD       07/16/2016 Procedure    Endoscopic bronchoscopy with transbronchial biopsy of #7 nodes and biopsy of RLL lung mass      07/16/2016 Pathology Results    Lung, biopsy, Right Lower Lobe - SMALL CELL CARCINOMA.       07/21/2016 Imaging    MRI brain Negative MRI of the brain. No evidence for metastatic disease to the brain or meninges.      07/26/2016 PET scan    Markedly hypermetabolic right lower lobe lesion with hypermetabolic metastatic disease in the right hilum  and mediastinum. 2. Several scattered hypermetabolic ground-glass opacities in the right upper lobe. FDG accumulation within these nodules is higher than typically seen for infectious/inflammatory etiology although this remains within the differential. Atypical appearance of metastatic spread would also be a consideration. 3. Coronary artery atherosclerosis. 4. **An incidental finding of potential clinical significance has been found. 3.8 cm abdominal aortic aneurysm       07/27/2016 - 11/11/2016 Chemotherapy    Cisplatin/Etoposide x 6 cycles      11/09/2016 Treatment Plan Change    Patient declined/refused whole brain radiation.      11/23/2016 Imaging    CT chest with No new or progressive findings in the chest. 2. Further interval decrease in size of mediastinal lymphadenopathy. 3. Only trace scarring visible in the right lower lobe, at the location of the previous right lower lobe mass. 4. Coronary artery and thoracoabdominal aortic atherosclerosis      02/21/2017 Imaging    CT CAP- 1. Unfortunately the patient has had significant recurrence with marked mediastinal and right infrahilar adenopathy as well as suspected masses in the collapsed right lower lobe. The confluent adenopathy completely occlude the right lower lobe and right middle lobe bronchi, although there is some air trapping in the right middle lobe ; the right lower lobe is completely collapsed around several right lower lobe masses. 2. There is some speckled density in the left anterior sixth rib which is not entirely specific but which is concerning for early osseous metastatic disease given that it was not present on 11/23/2016. Consider a bone  scan to further assess the skeleton for other occult bony spread. 3. Infrarenal abdominal aortic aneurysm 4.2 cm in diameter, increased from the prior measurement of 3.8 cm on 07/26/2016. Although this is only minor increase, it has occurred in a 7 month period. The  patient has a previous existing relationship of cardiothoracic surgery and surveillance of this aneurysm by surgery is appropriate. 4. Other imaging findings of potential clinical significance: Coronary, aortic arch, and branch vessel atherosclerotic vascular disease. Mild lumbar spondylosis and degenerative disc disease.      02/22/2017 Relapse/Recurrence    Last Treatment Date: 11/11/2016 Recent Lab Values: Recent Lab Values: N/A      02/28/2017 -  Chemotherapy    Topotecan       03/08/2017 Imaging    MRI brain- 1. No evidence of metastatic disease. 2. Intermittently motion degraded.      03/08/2017 Imaging    Bone scan: IMPRESSION: No evidence skeletal metastasis by bone scintigraphy.      03/10/2017 Miscellaneous    Consult at [facility] with Dr. Aniceto Boss:  Following Topotecan therapy, she may be a candidate for immunotherapy at time of progression/relapse, with Nivolumab versus Nivolumab/Ipilumumab.  Response rates with single-agent immunotherapy is ~ 10% and with combo therapy 20-30%.  Responses are durable.  Rate of side effects from therapy are ~ 5% and 20-30%.  This therapy is not yet FDA approved and therefore if this therapy option is approved, prior approval from insurance company will be needed.  Dr. Aniceto Boss favors the combination therapy.  Additionally, she may be eligible for a clinical trial (ie Rova-T + nivolumab/ipilumumab).      04/07/2017 Imaging    CT CAP- 1. Persistent but improved bulky mediastinal and right hilar/infrahilar lymphadenopathy and significant decrease in size of the right lower lobe mass and obstructive pneumonitis. Residual branching endobronchial tumor in the right lower lobe. 2. No findings for pulmonary metastatic nodules or abdominal/pelvic lymphadenopathy. 3. Stable infrarenal abdominal aortic aneurysm.      04/12/2017 Treatment Plan Change    Tx deferred x 1 week due to hyponatremia requiring work-up.       Patient presents for  follow-up today. She has been taking her sodium tablets. Her sodiums has dropped down to 1261 today. However she denies any symptoms. She denies any confusion, altered mental status or weakness at home. She denies any seizures at home. Otherwise she feels good and has no complaints.   Review of Systems  Constitutional: Negative.  Negative for chills, fever and weight loss.  HENT: Negative.  Negative for hearing loss, sore throat and tinnitus.   Eyes: Negative.  Negative for blurred vision, photophobia and discharge.  Respiratory: Negative.  Negative for cough, hemoptysis, shortness of breath and wheezing.   Cardiovascular: Negative.  Negative for chest pain, palpitations, orthopnea, claudication and leg swelling.  Gastrointestinal: Negative.  Negative for abdominal pain, blood in stool, constipation, diarrhea, melena, nausea and vomiting.  Genitourinary: Negative.  Negative for dysuria and hematuria.  Musculoskeletal: Negative.  Negative for back pain, joint pain and myalgias.  Skin: Negative.  Negative for itching and rash.  Neurological: Negative for dizziness, tremors, sensory change, speech change, focal weakness, seizures, loss of consciousness and weakness.  Endo/Heme/Allergies: Negative.  Negative for environmental allergies and polydipsia. Does not bruise/bleed easily.  Psychiatric/Behavioral: Negative.  Negative for depression. The patient is not nervous/anxious and does not have insomnia.     Past Medical History:  Diagnosis Date  . Elevated cholesterol   . GERD (gastroesophageal reflux disease)   .  GI bleed   . Goals of care, counseling/discussion 03/21/2017  . Hypertension   . 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 Small cell carcinoma ([place]) 07/21/2016    Past Surgical History:  Procedure Laterality Date  . ABDOMINAL HYSTERECTOMY     partial  . CESAREAN SECTION    . COLONOSCOPY    . PORTACATH PLACEMENT Left 08/04/2016   Procedure: INSERTION PORT-A-CATH  LEFT SUBCLAVIAN;  Surgeon: Aviva Signs, MD;  Location: [facility];  Service: General;  Laterality: Left;  . TONSILLECTOMY    . VIDEO BRONCHOSCOPY WITH ENDOBRONCHIAL ULTRASOUND N/A 07/16/2016   Procedure: VIDEO BRONCHOSCOPY WITH ENDOBRONCHIAL ULTRASOUND with ultrasound transbronchial biopsy of node #7 and right lower lobe lesion;  Surgeon: Grace Isaac, MD;  Location: [facility] OR;  Service: Thoracic;  Laterality: N/A;    Family History  Problem Relation Age of Onset  . Heart disease Mother   . Heart disease Father   . COPD Sister   . HIV Brother     Social History   Social History  . Marital status: Married    Spouse name: N/A  . Number of children: 4  . Years of education: N/A   Occupational History  . [employer]   Social History Main Topics  . Smoking status: Current Every Day Smoker    Packs/day: 0.50    Years: 35.00    Types: Cigarettes  . Smokeless tobacco: Never Used  . Alcohol use Yes     Comment: occasionally, Couple drinks per mo  . Drug use: Yes    Frequency: 1.0 time per week    Types: Marijuana     Comment: weekly  . Sexual activity: Yes    Birth control/ protection: Surgical     Comment: married   Other Topics Concern  . None   Social History Narrative   Lives w/ husband     PHYSICAL EXAMINATION  ECOG PERFORMANCE STATUS: 0 - Asymptomatic  Vitals:   05/09/17 1107  BP: 124/73  Pulse: 70  Resp: 16  Temp: 98.3 F (36.8 C)    Vitals - 1 value per visit 02/21/8118  SYSTOLIC 147  DIASTOLIC 74  Pulse 74  Temperature 97.9  Respirations 18  Weight (lb) 142.6    Physical Exam  Constitutional: She is oriented to person, place, and time and well-developed, well-nourished, and in no distress. No distress.  HENT:  Head: Normocephalic and atraumatic.  Mouth/Throat: No oropharyngeal exudate.  Eyes: Conjunctivae are normal. Pupils are equal, round, and reactive to light. No scleral icterus.  Neck: Normal range of motion. Neck supple. No JVD  present.  Cardiovascular: Normal rate, regular rhythm and normal heart sounds.  Exam reveals no gallop and no friction rub.   No murmur heard. Pulmonary/Chest: Breath sounds normal. No respiratory distress. She has no wheezes. She has no rales.  Abdominal: Soft. Bowel sounds are normal. She exhibits no distension. There is no tenderness. There is no guarding.  Musculoskeletal: She exhibits no edema or tenderness.  Lymphadenopathy:    She has no cervical adenopathy.  Neurological: She is alert and oriented to person, place, and time. No cranial nerve deficit.  Skin: Skin is warm and dry. No rash noted. No erythema. No pallor.  Psychiatric: Affect and judgment normal.     LABORATORY DATA: CBC    Component Value Date/Time   WBC 6.0 05/09/2017 1109   RBC 3.05 (L) 05/09/2017 1109   HGB 9.9 (L) 05/09/2017 1109   HCT 28.6 (L)  05/09/2017 1109   PLT 279 05/09/2017 1109   MCV 93.8 05/09/2017 1109   MCH 32.5 05/09/2017 1109   MCHC 34.6 05/09/2017 1109   RDW 23.0 (H) 05/09/2017 1109   LYMPHSABS 0.8 05/09/2017 1109   MONOABS 0.4 05/09/2017 1109   EOSABS 0.1 05/09/2017 1109   BASOSABS 0.0 05/09/2017 1109      Chemistry      Component Value Date/Time   NA 121 (L) 05/09/2017 1109   K 3.4 (L) 05/09/2017 1109   CL 86 (L) 05/09/2017 1109   CO2 27 05/09/2017 1109   BUN 12 05/09/2017 1109   CREATININE 0.82 05/09/2017 1109      Component Value Date/Time   CALCIUM 8.8 (L) 05/09/2017 1109   ALKPHOS 100 05/09/2017 1109   AST 43 (H) 05/09/2017 1109   ALT 46 05/09/2017 1109   BILITOT 0.6 05/09/2017 1109        PENDING LABS:   RADIOGRAPHIC STUDIES:  Mm Digital Screening Bilateral  Result Date: 04/13/2017 CLINICAL DATA:  Screening. EXAM: DIGITAL SCREENING BILATERAL MAMMOGRAM WITH CAD COMPARISON:  Previous exam(s). ACR Breast Density Category b: There are scattered areas of fibroglandular density. FINDINGS: There are no findings suspicious for malignancy. Images were processed with  CAD. IMPRESSION: No mammographic evidence of malignancy. A result letter of this screening mammogram will be mailed directly to the patient. RECOMMENDATION: Screening mammogram in one year. (Code:SM-B-01Y) BI-RADS CATEGORY  1: Negative. Electronically Signed   By: Evangeline Dakin M.D.   On: 04/13/2017 17:20     PATHOLOGY:    ASSESSMENT:  1. 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  S/P 6 cycles of Cisplatin/Etoposide (07/27/2016- 11/11/2016) with curative intent.  She was evaluated by radiation oncology for involved field XRT during chemotherapy, but due to tumor burden, she was deemed not an XRT candidate.  She refused whole brain XRT in the prophylactic setting following systemic chemotherapy.  Short interval relapse of disease <6 months on restaging CT imaging on 02/21/2017 leading to salvage treatment with Topotecan for recurrent/extensive stage SCLC.  S/P second opinion at [facility] with Dr. Lissa Morales on 03/10/2017.  -Continue chemotherapy as scheduled; she will get cycle 4 of topotecan today. Reviewed her labs.  -Patient stated that she wished to skip her July cycle of chemotherapy since she will be going on her annual trip to [facility] with her family in early August and does not want to feel sick. Therefore we will skip her July cycle chemotherapy and move her a month out to a/16/18 for cycle 5.  2. 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 Treatment of this is to treat the underlying disease. - Patient is asymptomatic. - Sodium dropped down to 121 today.  - Advised patient to increase her sodium tablets to TID. - I have advised patient to fluid restrict to less than a liter of fluids daily. - Encouraged patient to increase salt intake. - Potentially can add a loop diuretic with lasix 20mg  PO BID if worsening sodium level on fluid restriction and sodium tablets.  - if patient  should become symptomatic in the future from hyponatremia, such as developing seizures, then she can be placed on tolvaptan 15 mg PO daily.  RTC on 06/30/17 for follow up and cycle 5 of chemo.  THERAPY PLAN:  Topotecan  All questions were answered. The patient knows to call the clinic with any problems, questions or concerns.  We can certainly see the patient much sooner if necessary.   This note is electronically signed by: Twana First, MD 05/09/2017 2:32 PM

## 2017-05-10 ENCOUNTER — Encounter (HOSPITAL_BASED_OUTPATIENT_CLINIC_OR_DEPARTMENT_OTHER): Payer: Managed Care, Other (non HMO)

## 2017-05-10 VITALS — BP 129/78 | HR 75 | Temp 98.0°F | Resp 18

## 2017-05-10 DIAGNOSIS — Z5111 Encounter for antineoplastic chemotherapy: Secondary | ICD-10-CM

## 2017-05-10 DIAGNOSIS — C3431 Malignant neoplasm of lower lobe, right bronchus or lung: Secondary | ICD-10-CM | POA: Diagnosis not present

## 2017-05-10 DIAGNOSIS — C801 Malignant (primary) neoplasm, unspecified: Secondary | ICD-10-CM

## 2017-05-10 MED ORDER — SODIUM CHLORIDE 0.9 % IV SOLN
Freq: Once | INTRAVENOUS | Status: AC
  Administered 2017-05-10: 09:00:00 via INTRAVENOUS

## 2017-05-10 MED ORDER — HEPARIN SOD (PORK) LOCK FLUSH 100 UNIT/ML IV SOLN
500.0000 [IU] | Freq: Once | INTRAVENOUS | Status: AC | PRN
  Administered 2017-05-10: 500 [IU]

## 2017-05-10 MED ORDER — PROCHLORPERAZINE MALEATE 10 MG PO TABS
10.0000 mg | ORAL_TABLET | Freq: Once | ORAL | Status: AC
Start: 2017-05-10 — End: 2017-05-10
  Administered 2017-05-10: 10 mg via ORAL

## 2017-05-10 MED ORDER — PROCHLORPERAZINE MALEATE 10 MG PO TABS
ORAL_TABLET | ORAL | Status: AC
  Filled 2017-05-10: qty 1

## 2017-05-10 MED ORDER — SODIUM CHLORIDE 0.9 % IV SOLN
1.5000 mg/m2 | Freq: Once | INTRAVENOUS | Status: AC
  Administered 2017-05-10: 2.6 mg via INTRAVENOUS
  Filled 2017-05-10: qty 2.6

## 2017-05-10 MED ORDER — SODIUM CHLORIDE 0.9% FLUSH
10.0000 mL | INTRAVENOUS | Status: DC | PRN
  Administered 2017-05-10: 10 mL
  Filled 2017-05-10: qty 10

## 2017-05-10 MED ORDER — HEPARIN SOD (PORK) LOCK FLUSH 100 UNIT/ML IV SOLN
INTRAVENOUS | Status: AC
  Filled 2017-05-10: qty 5

## 2017-05-10 NOTE — Progress Notes (Unsigned)
No complaints voiced post chemo yesterday. Tolerated chemo well. Stable and ambulatory on discharge home to self.

## 2017-05-10 NOTE — Patient Instructions (Signed)
[facility] Discharge Instructions for Patients Receiving Chemotherapy   Beginning January 23rd 2017 lab work for the [facility] will be done in the  Main lab at [facility] on 1st floor. If you have a lab appointment with the [facility] please come in thru the  Main Entrance and check in at the main information desk   Today you received the following chemotherapy agents topotecan.  If you develop nausea and vomiting, or diarrhea that is not controlled by your medication, call the clinic.  The clinic phone number is [phone]. Office hours are Monday-Friday 8:30am-5:00pm.  BELOW ARE SYMPTOMS THAT SHOULD BE REPORTED IMMEDIATELY:  *FEVER GREATER THAN 101.0 F  *CHILLS WITH OR WITHOUT FEVER  NAUSEA AND VOMITING THAT IS NOT CONTROLLED WITH YOUR NAUSEA MEDICATION  *UNUSUAL SHORTNESS OF BREATH  *UNUSUAL BRUISING OR BLEEDING  TENDERNESS IN MOUTH AND THROAT WITH OR WITHOUT PRESENCE OF ULCERS  *URINARY PROBLEMS  *BOWEL PROBLEMS  UNUSUAL RASH Items with * indicate a potential emergency and should be followed up as soon as possible. If you have an emergency after office hours please contact your primary care physician or go to the nearest emergency department.  Please call the clinic during office hours if you have any questions or concerns.   You may also contact the Patient Navigator at [phone] should you have any questions or need assistance in obtaining follow up care.      Resources For Cancer Patients and their Caregivers ? American Cancer Society: Can assist with transportation, wigs, general needs, runs Look Good Feel Better.        5200279390 ? Cancer Care: Provides financial assistance, online support groups, medication/co-pay assistance.  1-800-813-HOPE 3326277875) ? [facility] Assists [place] cancer patients and their families through emotional , educational and financial support.   ([phone] ? [place] DSS Where to apply for food stamps, Medicaid and utility assistance. ([phone] ? RCATS: Transportation to medical appointments. 6048064427 ? Social Security Administration: May apply for disability if have a Stage IV cancer. 4052798203 [phone] ? [employer], Disability and Transit Services: Assists with nutrition, care and transit needs. [phone]

## 2017-05-11 ENCOUNTER — Encounter (HOSPITAL_COMMUNITY): Payer: Self-pay

## 2017-05-11 ENCOUNTER — Encounter (HOSPITAL_BASED_OUTPATIENT_CLINIC_OR_DEPARTMENT_OTHER): Payer: Managed Care, Other (non HMO)

## 2017-05-11 VITALS — BP 133/77 | HR 70 | Temp 98.2°F | Resp 18

## 2017-05-11 DIAGNOSIS — C3431 Malignant neoplasm of lower lobe, right bronchus or lung: Secondary | ICD-10-CM

## 2017-05-11 DIAGNOSIS — C801 Malignant (primary) neoplasm, unspecified: Secondary | ICD-10-CM

## 2017-05-11 DIAGNOSIS — Z5111 Encounter for antineoplastic chemotherapy: Secondary | ICD-10-CM | POA: Diagnosis not present

## 2017-05-11 MED ORDER — PROCHLORPERAZINE MALEATE 10 MG PO TABS
10.0000 mg | ORAL_TABLET | Freq: Once | ORAL | Status: AC
  Administered 2017-05-11: 10 mg via ORAL

## 2017-05-11 MED ORDER — SODIUM CHLORIDE 0.9 % IV SOLN
Freq: Once | INTRAVENOUS | Status: AC
  Administered 2017-05-11: 08:00:00 via INTRAVENOUS

## 2017-05-11 MED ORDER — SODIUM CHLORIDE 0.9% FLUSH
10.0000 mL | INTRAVENOUS | Status: DC | PRN
  Administered 2017-05-11: 10 mL
  Filled 2017-05-11: qty 10

## 2017-05-11 MED ORDER — PROCHLORPERAZINE MALEATE 10 MG PO TABS
ORAL_TABLET | ORAL | Status: AC
  Filled 2017-05-11: qty 1

## 2017-05-11 MED ORDER — TOPOTECAN HCL CHEMO INJECTION 4 MG
1.5000 mg/m2 | Freq: Once | INTRAVENOUS | Status: AC
  Administered 2017-05-11: 2.6 mg via INTRAVENOUS
  Filled 2017-05-11: qty 2.6

## 2017-05-11 MED ORDER — HEPARIN SOD (PORK) LOCK FLUSH 100 UNIT/ML IV SOLN
500.0000 [IU] | Freq: Once | INTRAVENOUS | Status: AC | PRN
  Administered 2017-05-11: 500 [IU]
  Filled 2017-05-11: qty 5

## 2017-05-11 NOTE — Progress Notes (Signed)
Lydia Moore tolerated chemo tx well without complaints or incident.Port left accessed with saline lock after being flushed per protocol. VSS upon discharge. Pt discharged self ambulatory in satisfactory condition

## 2017-05-11 NOTE — Patient Instructions (Signed)
[facility] Discharge Instructions for Patients Receiving Chemotherapy   Beginning January 23rd 2017 lab work for the [facility] will be done in the  Main lab at [facility] on 1st floor. If you have a lab appointment with the [facility] please come in thru the  Main Entrance and check in at the main information desk   Today you received the following chemotherapy agents Topotecan. Follow-up as scheduled. Call clinic for any questions or concerns  To help prevent nausea and vomiting after your treatment, we encourage you to take your nausea medication   If you develop nausea and vomiting, or diarrhea that is not controlled by your medication, call the clinic.  The clinic phone number is [phone]. Office hours are Monday-Friday 8:30am-5:00pm.  BELOW ARE SYMPTOMS THAT SHOULD BE REPORTED IMMEDIATELY:  *FEVER GREATER THAN 101.0 F  *CHILLS WITH OR WITHOUT FEVER  NAUSEA AND VOMITING THAT IS NOT CONTROLLED WITH YOUR NAUSEA MEDICATION  *UNUSUAL SHORTNESS OF BREATH  *UNUSUAL BRUISING OR BLEEDING  TENDERNESS IN MOUTH AND THROAT WITH OR WITHOUT PRESENCE OF ULCERS  *URINARY PROBLEMS  *BOWEL PROBLEMS  UNUSUAL RASH Items with * indicate a potential emergency and should be followed up as soon as possible. If you have an emergency after office hours please contact your primary care physician or go to the nearest emergency department.  Please call the clinic during office hours if you have any questions or concerns.   You may also contact the Patient Navigator at [phone] should you have any questions or need assistance in obtaining follow up care.      Resources For Cancer Patients and their Caregivers ? American Cancer Society: Can assist with transportation, wigs, general needs, runs Look Good Feel Better.        317-045-9847 ? Cancer Care: Provides financial assistance, online support groups, medication/co-pay assistance.  1-800-813-HOPE  307-521-7538) ? [facility] Assists [place] cancer patients and their families through emotional , educational and financial support.  [phone] [phone] ? [place] DSS Where to apply for food stamps, Medicaid and utility assistance. 212-527-1458 ? RCATS: Transportation to medical appointments. 310-067-2112 ? Social Security Administration: May apply for disability if have a Stage IV cancer. [phone] ? [employer], Disability and Transit Services: Assists with nutrition, care and transit needs. [phone]

## 2017-05-12 ENCOUNTER — Encounter (HOSPITAL_BASED_OUTPATIENT_CLINIC_OR_DEPARTMENT_OTHER): Payer: Managed Care, Other (non HMO)

## 2017-05-12 VITALS — BP 131/75 | HR 68 | Temp 98.0°F | Resp 18 | Wt 138.2 lb

## 2017-05-12 DIAGNOSIS — C3431 Malignant neoplasm of lower lobe, right bronchus or lung: Secondary | ICD-10-CM | POA: Diagnosis not present

## 2017-05-12 DIAGNOSIS — Z5111 Encounter for antineoplastic chemotherapy: Secondary | ICD-10-CM | POA: Diagnosis not present

## 2017-05-12 DIAGNOSIS — C801 Malignant (primary) neoplasm, unspecified: Secondary | ICD-10-CM

## 2017-05-12 MED ORDER — HEPARIN SOD (PORK) LOCK FLUSH 100 UNIT/ML IV SOLN
INTRAVENOUS | Status: AC
  Filled 2017-05-12: qty 5

## 2017-05-12 MED ORDER — HEPARIN SOD (PORK) LOCK FLUSH 100 UNIT/ML IV SOLN
500.0000 [IU] | Freq: Once | INTRAVENOUS | Status: AC | PRN
  Administered 2017-05-12: 500 [IU]

## 2017-05-12 MED ORDER — SODIUM CHLORIDE 0.9% FLUSH
10.0000 mL | INTRAVENOUS | Status: DC | PRN
  Administered 2017-05-12: 10 mL
  Filled 2017-05-12: qty 10

## 2017-05-12 MED ORDER — TOPOTECAN HCL CHEMO INJECTION 4 MG
1.5000 mg/m2 | Freq: Once | INTRAVENOUS | Status: AC
  Administered 2017-05-12: 2.6 mg via INTRAVENOUS
  Filled 2017-05-12: qty 2.6

## 2017-05-12 MED ORDER — PROCHLORPERAZINE MALEATE 10 MG PO TABS
10.0000 mg | ORAL_TABLET | Freq: Once | ORAL | Status: AC
  Administered 2017-05-12: 10 mg via ORAL

## 2017-05-12 MED ORDER — SODIUM CHLORIDE 0.9 % IV SOLN
Freq: Once | INTRAVENOUS | Status: AC
  Administered 2017-05-12: 08:00:00 via INTRAVENOUS

## 2017-05-12 MED ORDER — PROCHLORPERAZINE MALEATE 10 MG PO TABS
ORAL_TABLET | ORAL | Status: AC
  Filled 2017-05-12: qty 1

## 2017-05-12 NOTE — Progress Notes (Signed)
Tolerated chemo well. Stable and ambulatory on discharge home to self.

## 2017-05-12 NOTE — Patient Instructions (Signed)
[facility] Discharge Instructions for Patients Receiving Chemotherapy   Beginning January 23rd 2017 lab work for the [facility] will be done in the  Main lab at [facility] on 1st floor. If you have a lab appointment with the [facility] please come in thru the  Main Entrance and check in at the main information desk   Today you received the following chemotherapy agents topotecan day 4.  If you develop nausea and vomiting, or diarrhea that is not controlled by your medication, call the clinic.  The clinic phone number is [phone]. Office hours are Monday-Friday 8:30am-5:00pm.  BELOW ARE SYMPTOMS THAT SHOULD BE REPORTED IMMEDIATELY:  *FEVER GREATER THAN 101.0 F  *CHILLS WITH OR WITHOUT FEVER  NAUSEA AND VOMITING THAT IS NOT CONTROLLED WITH YOUR NAUSEA MEDICATION  *UNUSUAL SHORTNESS OF BREATH  *UNUSUAL BRUISING OR BLEEDING  TENDERNESS IN MOUTH AND THROAT WITH OR WITHOUT PRESENCE OF ULCERS  *URINARY PROBLEMS  *BOWEL PROBLEMS  UNUSUAL RASH Items with * indicate a potential emergency and should be followed up as soon as possible. If you have an emergency after office hours please contact your primary care physician or go to the nearest emergency department.  Please call the clinic during office hours if you have any questions or concerns.   You may also contact the Patient Navigator at [phone] should you have any questions or need assistance in obtaining follow up care.      Resources For Cancer Patients and their Caregivers ? American Cancer Society: Can assist with transportation, wigs, general needs, runs Look Good Feel Better.        7010990553 ? Cancer Care: Provides financial assistance, online support groups, medication/co-pay assistance.  1-800-813-HOPE 731-316-6235) ? [facility] Assists [place] cancer patients and their families through emotional , educational and financial support.   [phone] ? [place] DSS Where to apply for food stamps, Medicaid and utility assistance. [phone] ? RCATS: Transportation to medical appointments. 678-589-7979 ? Social Security Administration: May apply for disability if have a Stage IV cancer. [phone] ? [employer], Disability and Transit Services: Assists with nutrition, care and transit needs. [phone]

## 2017-05-13 ENCOUNTER — Encounter (HOSPITAL_COMMUNITY): Payer: Self-pay

## 2017-05-13 ENCOUNTER — Encounter (HOSPITAL_BASED_OUTPATIENT_CLINIC_OR_DEPARTMENT_OTHER): Payer: Managed Care, Other (non HMO)

## 2017-05-13 VITALS — BP 122/72 | HR 72 | Temp 97.7°F | Resp 18

## 2017-05-13 DIAGNOSIS — Z5111 Encounter for antineoplastic chemotherapy: Secondary | ICD-10-CM

## 2017-05-13 DIAGNOSIS — C801 Malignant (primary) neoplasm, unspecified: Secondary | ICD-10-CM

## 2017-05-13 DIAGNOSIS — C3431 Malignant neoplasm of lower lobe, right bronchus or lung: Secondary | ICD-10-CM

## 2017-05-13 DIAGNOSIS — Z5189 Encounter for other specified aftercare: Secondary | ICD-10-CM

## 2017-05-13 MED ORDER — PEGFILGRASTIM 6 MG/0.6ML ~~LOC~~ PSKT
PREFILLED_SYRINGE | SUBCUTANEOUS | Status: AC
  Filled 2017-05-13: qty 0.6

## 2017-05-13 MED ORDER — PROCHLORPERAZINE MALEATE 10 MG PO TABS
ORAL_TABLET | ORAL | Status: AC
  Filled 2017-05-13: qty 1

## 2017-05-13 MED ORDER — PEGFILGRASTIM 6 MG/0.6ML ~~LOC~~ PSKT
6.0000 mg | PREFILLED_SYRINGE | Freq: Once | SUBCUTANEOUS | Status: AC
  Administered 2017-05-13: 6 mg via SUBCUTANEOUS

## 2017-05-13 MED ORDER — SODIUM CHLORIDE 0.9% FLUSH
10.0000 mL | INTRAVENOUS | Status: DC | PRN
  Administered 2017-05-13: 10 mL
  Filled 2017-05-13: qty 10

## 2017-05-13 MED ORDER — SODIUM CHLORIDE 0.9 % IV SOLN
Freq: Once | INTRAVENOUS | Status: AC
  Administered 2017-05-13: 08:00:00 via INTRAVENOUS

## 2017-05-13 MED ORDER — PROCHLORPERAZINE MALEATE 10 MG PO TABS
10.0000 mg | ORAL_TABLET | Freq: Once | ORAL | Status: AC
Start: 2017-05-13 — End: 2017-05-13
  Administered 2017-05-13: 10 mg via ORAL

## 2017-05-13 MED ORDER — HEPARIN SOD (PORK) LOCK FLUSH 100 UNIT/ML IV SOLN
500.0000 [IU] | Freq: Once | INTRAVENOUS | Status: AC | PRN
  Administered 2017-05-13: 500 [IU]
  Filled 2017-05-13: qty 5

## 2017-05-13 MED ORDER — TOPOTECAN HCL CHEMO INJECTION 4 MG
1.5000 mg/m2 | Freq: Once | INTRAVENOUS | Status: AC
  Administered 2017-05-13: 2.6 mg via INTRAVENOUS
  Filled 2017-05-13: qty 2.6

## 2017-05-13 NOTE — Patient Instructions (Signed)
[facility] Discharge Instructions for Patients Receiving Chemotherapy   Beginning January 23rd 2017 lab work for the [facility] will be done in the  Main lab at [facility] on 1st floor. If you have a lab appointment with the [facility] please come in thru the  Main Entrance and check in at the main information desk   Today you received the following chemotherapy agents Topotecan as well as Neulasta on-pro. Follow-up as scheduled. Call clinic for any questions or concerns  To help prevent nausea and vomiting after your treatment, we encourage you to take your nausea medication   If you develop nausea and vomiting, or diarrhea that is not controlled by your medication, call the clinic.  The clinic phone number is [phone]. Office hours are Monday-Friday 8:30am-5:00pm.  BELOW ARE SYMPTOMS THAT SHOULD BE REPORTED IMMEDIATELY:  *FEVER GREATER THAN 101.0 F  *CHILLS WITH OR WITHOUT FEVER  NAUSEA AND VOMITING THAT IS NOT CONTROLLED WITH YOUR NAUSEA MEDICATION  *UNUSUAL SHORTNESS OF BREATH  *UNUSUAL BRUISING OR BLEEDING  TENDERNESS IN MOUTH AND THROAT WITH OR WITHOUT PRESENCE OF ULCERS  *URINARY PROBLEMS  *BOWEL PROBLEMS  UNUSUAL RASH Items with * indicate a potential emergency and should be followed up as soon as possible. If you have an emergency after office hours please contact your primary care physician or go to the nearest emergency department.  Please call the clinic during office hours if you have any questions or concerns.   You may also contact the Patient Navigator at [phone] should you have any questions or need assistance in obtaining follow up care.      Resources For Cancer Patients and their Caregivers ? American Cancer Society: Can assist with transportation, wigs, general needs, runs Look Good Feel Better.        [phone] ? Cancer Care: Provides financial assistance, online support groups, medication/co-pay  assistance.  1-800-813-HOPE 956-469-9367) ? [place] Assists [place] cancer patients and their families through emotional , educational and financial support.  [phone] ? [place] DSS Where to apply for food stamps, Medicaid and utility assistance. 346-019-1180 ? [employer]: Transportation to medical appointments. [phone] ? Social Security Administration: May apply for disability if have a Stage IV cancer. [phone] ? [employer], Disability and Transit Services: Assists with nutrition, care and transit needs. [phone]

## 2017-05-13 NOTE — Progress Notes (Signed)
Lydia Moore tolerated chemo tx  And Neulasta on-pro well without complaints or incident. Neulasta on-pro applied to pt's right arm with green indicator light flashing. VSS Pt discharged self ambyuatory in satisfactory condition

## 2017-06-16 ENCOUNTER — Other Ambulatory Visit (HOSPITAL_COMMUNITY): Payer: Self-pay | Admitting: Hematology & Oncology

## 2017-06-16 DIAGNOSIS — C801 Malignant (primary) neoplasm, unspecified: Secondary | ICD-10-CM

## 2017-06-26 ENCOUNTER — Other Ambulatory Visit (HOSPITAL_COMMUNITY): Payer: Self-pay | Admitting: Oncology

## 2017-06-26 DIAGNOSIS — T451X5A Adverse effect of antineoplastic and immunosuppressive drugs, initial encounter: Principal | ICD-10-CM

## 2017-06-26 DIAGNOSIS — G620 Drug-induced polyneuropathy: Secondary | ICD-10-CM

## 2017-06-27 ENCOUNTER — Encounter (HOSPITAL_BASED_OUTPATIENT_CLINIC_OR_DEPARTMENT_OTHER): Payer: Managed Care, Other (non HMO)

## 2017-06-27 ENCOUNTER — Encounter (HOSPITAL_COMMUNITY): Payer: Managed Care, Other (non HMO) | Attending: Hematology & Oncology | Admitting: Oncology

## 2017-06-27 ENCOUNTER — Other Ambulatory Visit (HOSPITAL_COMMUNITY): Payer: Self-pay | Admitting: Oncology

## 2017-06-27 ENCOUNTER — Encounter (HOSPITAL_COMMUNITY): Payer: Self-pay | Admitting: Oncology

## 2017-06-27 VITALS — BP 123/82 | HR 67 | Temp 97.8°F | Resp 16

## 2017-06-27 VITALS — BP 122/81 | HR 75 | Resp 16 | Ht 65.0 in | Wt 132.0 lb

## 2017-06-27 DIAGNOSIS — C3431 Malignant neoplasm of lower lobe, right bronchus or lung: Secondary | ICD-10-CM

## 2017-06-27 DIAGNOSIS — E222 Syndrome of inappropriate secretion of antidiuretic hormone: Secondary | ICD-10-CM | POA: Diagnosis not present

## 2017-06-27 DIAGNOSIS — Z5111 Encounter for antineoplastic chemotherapy: Secondary | ICD-10-CM | POA: Diagnosis not present

## 2017-06-27 DIAGNOSIS — C801 Malignant (primary) neoplasm, unspecified: Secondary | ICD-10-CM | POA: Insufficient documentation

## 2017-06-27 DIAGNOSIS — E871 Hypo-osmolality and hyponatremia: Secondary | ICD-10-CM | POA: Diagnosis not present

## 2017-06-27 DIAGNOSIS — Z720 Tobacco use: Secondary | ICD-10-CM | POA: Diagnosis not present

## 2017-06-27 LAB — COMPREHENSIVE METABOLIC PANEL
ALBUMIN: 4.1 g/dL (ref 3.5–5.0)
ALT: 31 U/L (ref 14–54)
ANION GAP: 10 (ref 5–15)
AST: 33 U/L (ref 15–41)
Alkaline Phosphatase: 75 U/L (ref 38–126)
BILIRUBIN TOTAL: 0.4 mg/dL (ref 0.3–1.2)
BUN: 19 mg/dL (ref 6–20)
CALCIUM: 9.1 mg/dL (ref 8.9–10.3)
CO2: 26 mmol/L (ref 22–32)
Chloride: 90 mmol/L — ABNORMAL LOW (ref 101–111)
Creatinine, Ser: 0.79 mg/dL (ref 0.44–1.00)
GFR calc Af Amer: >60 mL/min (ref >60)
Glucose, Bld: 157 mg/dL — ABNORMAL HIGH (ref 65–99)
POTASSIUM: 3 mmol/L — AB (ref 3.5–5.1)
Sodium: 126 mmol/L — ABNORMAL LOW (ref 135–145)
TOTAL PROTEIN: 7 g/dL (ref 6.5–8.1)

## 2017-06-27 LAB — CBC WITH DIFFERENTIAL/PLATELET
BASOS PCT: 0 %
Basophils Absolute: 0 10*3/uL (ref 0.0–0.1)
Eosinophils Absolute: 0.2 10*3/uL (ref 0.0–0.7)
Eosinophils Relative: 4 %
HEMATOCRIT: 31.7 % — AB (ref 36.0–46.0)
Hemoglobin: 10.6 g/dL — ABNORMAL LOW (ref 12.0–15.0)
LYMPHS ABS: 0.6 10*3/uL — AB (ref 0.7–4.0)
Lymphocytes Relative: 11 %
MCH: 30.1 pg (ref 26.0–34.0)
MCHC: 33.4 g/dL (ref 30.0–36.0)
MCV: 90.1 fL (ref 78.0–100.0)
MONO ABS: 0.3 10*3/uL (ref 0.1–1.0)
MONOS PCT: 5 %
NEUTROS ABS: 4.2 10*3/uL (ref 1.7–7.7)
Neutrophils Relative %: 80 %
Platelets: 166 10*3/uL (ref 150–400)
RBC: 3.52 MIL/uL — ABNORMAL LOW (ref 3.87–5.11)
RDW: 17.2 % — AB (ref 11.5–15.5)
WBC: 5.2 10*3/uL (ref 4.0–10.5)

## 2017-06-27 LAB — MAGNESIUM: MAGNESIUM: 1.7 mg/dL (ref 1.7–2.4)

## 2017-06-27 MED ORDER — SODIUM CHLORIDE 0.9% FLUSH: 10.0000 mL | INTRAVENOUS | Status: DC | PRN

## 2017-06-27 MED ORDER — POTASSIUM CHLORIDE CRYS ER 20 MEQ PO TBCR
60.0000 meq | EXTENDED_RELEASE_TABLET | Freq: Two times a day (BID) | ORAL | Status: DC
  Administered 2017-06-27: 60 meq via ORAL

## 2017-06-27 MED ORDER — PROCHLORPERAZINE MALEATE 10 MG PO TABS
10.0000 mg | ORAL_TABLET | Freq: Once | ORAL | Status: AC
  Administered 2017-06-27: 10 mg via ORAL
  Filled 2017-06-27: qty 1

## 2017-06-27 MED ORDER — TOPOTECAN HCL CHEMO INJECTION 4 MG
1.5000 mg/m2 | Freq: Once | INTRAVENOUS | Status: AC
  Administered 2017-06-27: 2.6 mg via INTRAVENOUS
  Filled 2017-06-27: qty 2.6

## 2017-06-27 MED ORDER — POTASSIUM CHLORIDE CRYS ER 20 MEQ PO TBCR
EXTENDED_RELEASE_TABLET | ORAL | Status: AC
  Filled 2017-06-27: qty 3

## 2017-06-27 MED ORDER — SODIUM CHLORIDE 0.9 % IV SOLN
Freq: Once | INTRAVENOUS | Status: AC
  Administered 2017-06-27: 11:00:00 via INTRAVENOUS

## 2017-06-27 NOTE — Progress Notes (Signed)
Potassium ordered and given per MD. Labs reviewed with MD.Chemotherapy given today per orders. Vitals stable and discharged home from clinic ambulatory.Follow up as scheduled.

## 2017-06-27 NOTE — Progress Notes (Signed)
Practice, [facility] [place] [address]  No diagnosis found.  CURRENT THERAPY: Salvage Topotecan therapy beginning on 02/28/2017.  INTERVAL HISTORY: Lydia Moore 56 y.o. female returns for followup of small cell lung cancer right lung, limited stage, at time of diagnosis, presenting with SIADH.  S/P 6 cycles of cisplatin/etoposide (07/27/2016-11/11/2016) curative intent.  She was evaluated by radiation oncology for involved field XRT during chemotherapy, but due to tumor burden, she was deemed not an XRT candidate.  She refused whole brain XRT in the prophylactic setting following systemic chemotherapy.  Short interval relapse of disease less than 6 months on restaging CT scans on 02/21/2017 leading to salvage treatment with Topotecan for recurrent rash extensive stage small cell lung cancer.  S/P second opinion at [facility] with Dr. Lissa Morales on 03/10/2017.    Small cell carcinoma ([facility])   07/12/2016 Imaging    CTA chest 3.9 x 3.5 cm right lower lobe mass with imaging features most compatible with a primary lung carcinoma.Marked metastatic right hilar and mediastinal adenopathy including a confluent mass of adenopathy in the right hilum, encasing and causing marked narrowing of the central pulmonary arteries on the right with a short segment of occlusion of the right lower lobe bronchus.Mild postobstructive changes in the inferior, medial right lower lobe.Mild changes of COPD       07/16/2016 Procedure    Endoscopic bronchoscopy with transbronchial biopsy of #7 nodes and biopsy of RLL lung mass      07/16/2016 Pathology Results    Lung, biopsy, Right Lower Lobe - SMALL CELL CARCINOMA.       07/21/2016 Imaging    MRI brain Negative MRI of the brain. No evidence for metastatic disease to the brain or meninges.      07/26/2016 PET scan    Markedly hypermetabolic right lower lobe lesion with hypermetabolic metastatic disease in the right hilum  and mediastinum. 2. Several scattered hypermetabolic ground-glass opacities in the right upper lobe. FDG accumulation within these nodules is higher than typically seen for infectious/inflammatory etiology although this remains within the differential. Atypical appearance of metastatic spread would also be a consideration. 3. Coronary artery atherosclerosis. 4. **An incidental finding of potential clinical significance has been found. 3.8 cm abdominal aortic aneurysm       07/27/2016 - 11/11/2016 Chemotherapy    Cisplatin/Etoposide x 6 cycles      11/09/2016 Treatment Plan Change    Patient declined/refused whole brain radiation.      11/23/2016 Imaging    CT chest with No new or progressive findings in the chest. 2. Further interval decrease in size of mediastinal lymphadenopathy. 3. Only trace scarring visible in the right lower lobe, at the location of the previous right lower lobe mass. 4. Coronary artery and thoracoabdominal aortic atherosclerosis      02/21/2017 Imaging    CT CAP- 1. Unfortunately the patient has had significant recurrence with marked mediastinal and right infrahilar adenopathy as well as suspected masses in the collapsed right lower lobe. The confluent adenopathy completely occlude the right lower lobe and right middle lobe bronchi, although there is some air trapping in the right middle lobe ; the right lower lobe is completely collapsed around several right lower lobe masses. 2. There is some speckled density in the left anterior sixth rib which is not entirely specific but which is concerning for early osseous metastatic disease given that it was not present on 11/23/2016. Consider a bone  scan to further assess the skeleton for other occult bony spread. 3. Infrarenal abdominal aortic aneurysm 4.2 cm in diameter, increased from the prior measurement of 3.8 cm on 07/26/2016. Although this is only minor increase, it has occurred in a 7 month period. The  patient has a previous existing relationship of cardiothoracic surgery and surveillance of this aneurysm by surgery is appropriate. 4. Other imaging findings of potential clinical significance: Coronary, aortic arch, and branch vessel atherosclerotic vascular disease. Mild lumbar spondylosis and degenerative disc disease.      02/22/2017 Relapse/Recurrence    Last Treatment Date: 11/11/2016 Recent Lab Values: Recent Lab Values: N/A      02/28/2017 -  Chemotherapy    Topotecan       03/08/2017 Imaging    MRI brain- 1. No evidence of metastatic disease. 2. Intermittently motion degraded.      03/08/2017 Imaging    Bone scan: IMPRESSION: No evidence skeletal metastasis by bone scintigraphy.      03/10/2017 Miscellaneous    Consult at [facility] with Dr. Aniceto Boss:  Following Topotecan therapy, she may be a candidate for immunotherapy at time of progression/relapse, with Nivolumab versus Nivolumab/Ipilumumab.  Response rates with single-agent immunotherapy is ~ 10% and with combo therapy 20-30%.  Responses are durable.  Rate of side effects from therapy are ~ 5% and 20-30%.  This therapy is not yet FDA approved and therefore if this therapy option is approved, prior approval from insurance company will be needed.  Dr. Aniceto Boss favors the combination therapy.  Additionally, she may be eligible for a clinical trial (ie Rova-T + nivolumab/ipilumumab).      04/07/2017 Imaging    CT CAP- 1. Persistent but improved bulky mediastinal and right hilar/infrahilar lymphadenopathy and significant decrease in size of the right lower lobe mass and obstructive pneumonitis. Residual branching endobronchial tumor in the right lower lobe. 2. No findings for pulmonary metastatic nodules or abdominal/pelvic lymphadenopathy. 3. Stable infrarenal abdominal aortic aneurysm.      04/12/2017 Treatment Plan Change    Tx deferred x 1 week due to hyponatremia requiring work-up.       Patient presents for  follow-up today.Patient states that she has been doing well since her last visit except for an episode of dizziness with loss of consciousness when she was walking her grandson out to the car last week after she had worked at her job that day. She denies any chest pain, shortness breath, abdominal pain. She denies having any issues while she went to [facility] for vacation.  Review of Systems  Constitutional: Negative.  Negative for chills, fever and weight loss.  HENT: Negative.  Negative for hearing loss, sore throat and tinnitus.   Eyes: Negative.  Negative for blurred vision, photophobia and discharge.  Respiratory: Negative.  Negative for cough, hemoptysis, shortness of breath and wheezing.   Cardiovascular: Negative.  Negative for chest pain, palpitations, orthopnea, claudication and leg swelling.  Gastrointestinal: Negative.  Negative for abdominal pain, blood in stool, constipation, diarrhea, melena, nausea and vomiting.  Genitourinary: Negative.  Negative for dysuria and hematuria.  Musculoskeletal: Negative.  Negative for back pain, joint pain and myalgias.  Skin: Negative.  Negative for itching and rash.  Neurological: Negative for dizziness, tremors, sensory change, speech change, focal weakness, seizures, loss of consciousness and weakness.  Endo/Heme/Allergies: Negative.  Negative for environmental allergies and polydipsia. Does not bruise/bleed easily.  Psychiatric/Behavioral: Negative.  Negative for depression. The patient is not nervous/anxious and does not have insomnia.  Past Medical History:  Diagnosis Date  . Elevated cholesterol   . GERD (gastroesophageal reflux disease)   . GI bleed   . Goals of care, counseling/discussion 03/21/2017  . Hypertension   . 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 Small cell carcinoma ([place]) 07/21/2016    Past Surgical History:  Procedure Laterality Date  . ABDOMINAL HYSTERECTOMY     partial  . CESAREAN SECTION     . COLONOSCOPY    . PORTACATH PLACEMENT Left 08/04/2016   Procedure: INSERTION PORT-A-CATH LEFT SUBCLAVIAN;  Surgeon: Aviva Signs, MD;  Location: [facility];  Service: General;  Laterality: Left;  . TONSILLECTOMY    . VIDEO BRONCHOSCOPY WITH ENDOBRONCHIAL ULTRASOUND N/A 07/16/2016   Procedure: VIDEO BRONCHOSCOPY WITH ENDOBRONCHIAL ULTRASOUND with ultrasound transbronchial biopsy of node #7 and right lower lobe lesion;  Surgeon: Grace Isaac, MD;  Location: [facility] OR;  Service: Thoracic;  Laterality: N/A;    Family History  Problem Relation Age of Onset  . Heart disease Mother   . Heart disease Father   . COPD Sister   . HIV Brother     Social History   Social History  . Marital status: Married    Spouse name: N/A  . Number of children: 4  . Years of education: N/A   Occupational History  . [employer]   Social History Main Topics  . Smoking status: Current Every Day Smoker    Packs/day: 0.50    Years: 35.00    Types: Cigarettes  . Smokeless tobacco: Never Used  . Alcohol use Yes     Comment: occasionally, Couple drinks per mo  . Drug use: Yes    Frequency: 1.0 time per week    Types: Marijuana     Comment: weekly  . Sexual activity: Yes    Birth control/ protection: Surgical     Comment: married   Other Topics Concern  . None   Social History Narrative   Lives w/ husband     PHYSICAL EXAMINATION  ECOG PERFORMANCE STATUS: 0 - Asymptomatic  Vitals:   06/27/17 1010  BP: 122/81  Pulse: 75  Resp: 16  SpO2: 100%    Vitals - 1 value per visit 01/28/1760  SYSTOLIC 607  DIASTOLIC 74  Pulse 74  Temperature 97.9  Respirations 18  Weight (lb) 142.6    Physical Exam  Constitutional: She is oriented to person, place, and time and well-developed, well-nourished, and in no distress. No distress.  HENT:  Head: Normocephalic and atraumatic.  Mouth/Throat: No oropharyngeal exudate.  Eyes: Pupils are equal, round, and reactive to light. Conjunctivae are  normal. No scleral icterus.  Neck: Normal range of motion. Neck supple. No JVD present.  Cardiovascular: Normal rate, regular rhythm and normal heart sounds.  Exam reveals no gallop and no friction rub.   No murmur heard. Pulmonary/Chest: Breath sounds normal. No respiratory distress. She has no wheezes. She has no rales.  Abdominal: Soft. Bowel sounds are normal. She exhibits no distension. There is no tenderness. There is no guarding.  Musculoskeletal: She exhibits no edema or tenderness.  Lymphadenopathy:    She has no cervical adenopathy.  Neurological: She is alert and oriented to person, place, and time. No cranial nerve deficit.  Skin: Skin is warm and dry. No rash noted. No erythema. No pallor.  Psychiatric: Affect and judgment normal.     LABORATORY DATA: CBC    Component Value Date/Time   WBC 5.2 06/27/2017 1014  RBC 3.52 (L) 06/27/2017 1014   HGB 10.6 (L) 06/27/2017 1014   HCT 31.7 (L) 06/27/2017 1014   PLT 166 06/27/2017 1014   MCV 90.1 06/27/2017 1014   MCH 30.1 06/27/2017 1014   MCHC 33.4 06/27/2017 1014   RDW 17.2 (H) 06/27/2017 1014   LYMPHSABS 0.6 (L) 06/27/2017 1014   MONOABS 0.3 06/27/2017 1014   EOSABS 0.2 06/27/2017 1014   BASOSABS 0.0 06/27/2017 1014      Chemistry      Component Value Date/Time   NA 126 (L) 06/27/2017 1014   K 3.0 (L) 06/27/2017 1014   CL 90 (L) 06/27/2017 1014   CO2 26 06/27/2017 1014   BUN 19 06/27/2017 1014   CREATININE 0.79 06/27/2017 1014      Component Value Date/Time   CALCIUM 9.1 06/27/2017 1014   ALKPHOS 75 06/27/2017 1014   AST 33 06/27/2017 1014   ALT 31 06/27/2017 1014   BILITOT 0.4 06/27/2017 1014        PENDING LABS:   RADIOGRAPHIC STUDIES:  No results found.   PATHOLOGY:    ASSESSMENT:  1. 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  S/P 6 cycles of Cisplatin/Etoposide (07/27/2016- 11/11/2016) with curative intent.  She was evaluated by radiation  oncology for involved field XRT during chemotherapy, but due to tumor burden, she was deemed not an XRT candidate.  She refused whole brain XRT in the prophylactic setting following systemic chemotherapy.  Short interval relapse of disease <6 months on restaging CT imaging on 02/21/2017 leading to salvage treatment with Topotecan for recurrent/extensive stage SCLC.  S/P second opinion at [place] with Dr. Lissa Morales on 03/10/2017.  -Continue chemotherapy as scheduled; she will get cycle 5 of topotecan today. Reviewed her labs.  -Plan to repeat her restaging scans after cycle 6.  2. 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 Treatment of this is to treat the underlying disease. - Patient is asymptomatic. - Sodium improving, up to 126 today. - Advised patient to continue her sodium tablets PO TID. - I have advised patient to fluid restrict to less than a liter of fluids daily. - Encouraged patient to increase salt intake. - Potentially can add a loop diuretic with lasix 20mg  PO BID if worsening sodium level on fluid restriction and sodium tablets.  - if patient should become symptomatic in the future from hyponatremia, such as developing seizures, then she can be placed on tolvaptan 15 mg PO daily.  RTC on 07/25/17 for follow up  THERAPY PLAN:  Topotecan  All questions were answered. The patient knows to call the clinic with any problems, questions or concerns. We can certainly see the patient much sooner if necessary.   This note is electronically signed by: Twana First, MD 06/27/2017 12:00 PM

## 2017-06-27 NOTE — Patient Instructions (Signed)
[facility] Discharge Instructions for Patients Receiving Chemotherapy   Beginning January 23rd 2017 lab work for the Cancer Center will be done in the  Main lab at [place] on 1st floor. If you have a lab appointment with the Cancer Center please come in thru the  Main Entrance and check in at the main information desk   Today you received the following chemotherapy agents   To help prevent nausea and vomiting after your treatment, we encourage you to take your nausea medication     If you develop nausea and vomiting, or diarrhea that is not controlled by your medication, call the clinic.  The clinic phone number is [phone]. Office hours are Monday-Friday 8:30am-5:00pm.  BELOW ARE SYMPTOMS THAT SHOULD BE REPORTED IMMEDIATELY:  *FEVER GREATER THAN 101.0 F  *CHILLS WITH OR WITHOUT FEVER  NAUSEA AND VOMITING THAT IS NOT CONTROLLED WITH YOUR NAUSEA MEDICATION  *UNUSUAL SHORTNESS OF BREATH  *UNUSUAL BRUISING OR BLEEDING  TENDERNESS IN MOUTH AND THROAT WITH OR WITHOUT PRESENCE OF ULCERS  *URINARY PROBLEMS  *BOWEL PROBLEMS  UNUSUAL RASH Items with * indicate a potential emergency and should be followed up as soon as possible. If you have an emergency after office hours please contact your primary care physician or go to the nearest emergency department.  Please call the clinic during office hours if you have any questions or concerns.   You may also contact the Patient Navigator at [phone] should you have any questions or need assistance in obtaining follow up care.      Resources For Cancer Patients and their Caregivers ? American Cancer Society: Can assist with transportation, wigs, general needs, runs Look Good Feel Better.        1-888-227-6333 ? Cancer Care: Provides financial assistance, online support groups, medication/co-pay assistance.  1-800-813-HOPE (4673) ? [facility] Assists [place] cancer  patients and their families through emotional , educational and financial support.  336-427-4357 ? [place] DSS Where to apply for food stamps, Medicaid and utility assistance. 336-342-1394 ? [employer]: Transportation to medical appointments. 336-347-2287 ? Social Security Administration: May apply for disability if have a Stage IV cancer. 336-342-7796 1-800-772-1213 ? [place] [facility]ging, Disability and Transit Services: Assists with nutrition, care and transit needs. 336-349-2343         

## 2017-06-28 ENCOUNTER — Encounter (HOSPITAL_BASED_OUTPATIENT_CLINIC_OR_DEPARTMENT_OTHER): Payer: Managed Care, Other (non HMO)

## 2017-06-28 ENCOUNTER — Encounter (HOSPITAL_COMMUNITY): Payer: Self-pay

## 2017-06-28 VITALS — BP 126/80 | HR 65 | Temp 97.6°F | Resp 18 | Wt 131.6 lb

## 2017-06-28 DIAGNOSIS — J301 Allergic rhinitis due to pollen: Secondary | ICD-10-CM

## 2017-06-28 DIAGNOSIS — C801 Malignant (primary) neoplasm, unspecified: Secondary | ICD-10-CM

## 2017-06-28 DIAGNOSIS — Z5111 Encounter for antineoplastic chemotherapy: Secondary | ICD-10-CM | POA: Diagnosis not present

## 2017-06-28 DIAGNOSIS — C3431 Malignant neoplasm of lower lobe, right bronchus or lung: Secondary | ICD-10-CM | POA: Diagnosis not present

## 2017-06-28 MED ORDER — SODIUM CHLORIDE 0.9% FLUSH
10.0000 mL | INTRAVENOUS | Status: DC | PRN
  Administered 2017-06-28 (×2): 10 mL
  Filled 2017-06-28 (×2): qty 10

## 2017-06-28 MED ORDER — SODIUM CHLORIDE 0.9 % IV SOLN
1.5000 mg/m2 | Freq: Once | INTRAVENOUS | Status: AC
  Administered 2017-06-28: 2.6 mg via INTRAVENOUS
  Filled 2017-06-28: qty 2.6

## 2017-06-28 MED ORDER — SODIUM CHLORIDE 0.9 % IV SOLN
Freq: Once | INTRAVENOUS | Status: AC
  Administered 2017-06-28: 12:00:00 via INTRAVENOUS

## 2017-06-28 MED ORDER — SODIUM CHLORIDE 0.9% FLUSH: 10.0000 mL | Freq: Once | INTRAVENOUS | Status: AC

## 2017-06-28 MED ORDER — LORATADINE 10 MG PO TABS: 10.0000 mg | ORAL_TABLET | Freq: Every day | ORAL | 2 refills | Status: DC

## 2017-06-28 MED ORDER — PROCHLORPERAZINE MALEATE 10 MG PO TABS
10.0000 mg | ORAL_TABLET | Freq: Once | ORAL | Status: AC
  Administered 2017-06-28: 10 mg via ORAL

## 2017-06-28 MED ORDER — PROCHLORPERAZINE MALEATE 10 MG PO TABS
ORAL_TABLET | ORAL | Status: AC
  Filled 2017-06-28: qty 1

## 2017-06-28 NOTE — Patient Instructions (Addendum)
[facility] Discharge Instructions for Patients Receiving Chemotherapy   If you have a lab appointment with the [facility] please come in thru the  Main Entrance and check in at the main information desk   Today you received the following chemotherapy agents: Topotecan  To help prevent nausea and vomiting after your treatment, we encourage you to take your nausea medication as prescribed.   If you develop nausea and vomiting, or diarrhea that is not controlled by your medication, call the clinic.  The clinic phone number is [phone]. Office hours are Monday-Friday 8:30am-5:00pm.  BELOW ARE SYMPTOMS THAT SHOULD BE REPORTED IMMEDIATELY:  *FEVER GREATER THAN 101.0 F  *CHILLS WITH OR WITHOUT FEVER  NAUSEA AND VOMITING THAT IS NOT CONTROLLED WITH YOUR NAUSEA MEDICATION  *UNUSUAL SHORTNESS OF BREATH  *UNUSUAL BRUISING OR BLEEDING  TENDERNESS IN MOUTH AND THROAT WITH OR WITHOUT PRESENCE OF ULCERS  *URINARY PROBLEMS  *BOWEL PROBLEMS  UNUSUAL RASH Items with * indicate a potential emergency and should be followed up as soon as possible. If you have an emergency after office hours please contact your primary care physician or go to the nearest emergency department.  Please call the clinic during office hours if you have any questions or concerns.   You may also contact the Patient Navigator at [phone] should you have any questions or need assistance in obtaining follow up care.      Resources For Cancer Patients and their Caregivers ? American Cancer Society: Can assist with transportation, wigs, general needs, runs Look Good Feel Better.        (813) [phone] ? Cancer Care: Provides financial assistance, online support groups, medication/co-pay assistance.  1-800-813-HOPE 757-210-8925) ? [facility] Assists [place] cancer patients and their families through emotional , educational and financial support.   579-263-0470 ? [place] DSS Where to apply for food stamps, Medicaid and utility assistance. (787) 188-0235 ? [facility]: Transportation to medical appointments. 765-523-3122 ? Social Security Administration: May apply for disability if have a Stage IV cancer. 747-675-1150 920-443-2487 ? [employer], Disability and Transit Services: Assists with nutrition, care and transit needs. [phone]

## 2017-06-28 NOTE — Progress Notes (Signed)
Patient tolerated infusion today without incidence. Patient's port left accessed. Patient discharged ambulatory and in stable condition. Patient to follow up tomorrow as scheduled.

## 2017-06-29 ENCOUNTER — Encounter (HOSPITAL_COMMUNITY): Payer: Managed Care, Other (non HMO) | Attending: Adult Health

## 2017-06-29 ENCOUNTER — Other Ambulatory Visit (HOSPITAL_COMMUNITY): Payer: Self-pay | Admitting: Oncology

## 2017-06-29 ENCOUNTER — Other Ambulatory Visit (HOSPITAL_COMMUNITY): Payer: Self-pay | Admitting: Pharmacist

## 2017-06-29 VITALS — BP 131/81 | HR 72 | Temp 98.2°F | Resp 18

## 2017-06-29 DIAGNOSIS — Z5111 Encounter for antineoplastic chemotherapy: Secondary | ICD-10-CM | POA: Diagnosis not present

## 2017-06-29 DIAGNOSIS — C801 Malignant (primary) neoplasm, unspecified: Secondary | ICD-10-CM

## 2017-06-29 DIAGNOSIS — C3431 Malignant neoplasm of lower lobe, right bronchus or lung: Secondary | ICD-10-CM | POA: Diagnosis not present

## 2017-06-29 MED ORDER — TOPOTECAN HCL CHEMO INJECTION 4 MG
1.5000 mg/m2 | Freq: Once | INTRAVENOUS | Status: AC
  Administered 2017-06-29: 2.6 mg via INTRAVENOUS
  Filled 2017-06-29: qty 2.6

## 2017-06-29 MED ORDER — SODIUM CHLORIDE 0.9% FLUSH
10.0000 mL | INTRAVENOUS | Status: DC | PRN
  Administered 2017-06-29: 10 mL
  Filled 2017-06-29: qty 10

## 2017-06-29 MED ORDER — PROCHLORPERAZINE MALEATE 10 MG PO TABS
10.0000 mg | ORAL_TABLET | Freq: Once | ORAL | Status: AC
  Administered 2017-06-29: 10 mg via ORAL

## 2017-06-29 MED ORDER — PROCHLORPERAZINE MALEATE 10 MG PO TABS
ORAL_TABLET | ORAL | Status: AC
  Filled 2017-06-29: qty 1

## 2017-06-29 MED ORDER — SODIUM CHLORIDE 0.9 % IV SOLN
Freq: Once | INTRAVENOUS | Status: AC
  Administered 2017-06-29: 12:00:00 via INTRAVENOUS

## 2017-06-29 NOTE — Patient Instructions (Signed)
[facility] Discharge Instructions for Patients Receiving Chemotherapy   If you have a lab appointment with the [facility] please come in thru the  Main Entrance and check in at the main information desk   Today you received the following chemotherapy agents: topotecan  To help prevent nausea and vomiting after your treatment, we encourage you to take your nausea medication as prescribed.   If you develop nausea and vomiting, or diarrhea that is not controlled by your medication, call the clinic.  The clinic phone number is [phone]. Office hours are Monday-Friday 8:30am-5:00pm.  BELOW ARE SYMPTOMS THAT SHOULD BE REPORTED IMMEDIATELY:  *FEVER GREATER THAN 101.0 F  *CHILLS WITH OR WITHOUT FEVER  NAUSEA AND VOMITING THAT IS NOT CONTROLLED WITH YOUR NAUSEA MEDICATION  *UNUSUAL SHORTNESS OF BREATH  *UNUSUAL BRUISING OR BLEEDING  TENDERNESS IN MOUTH AND THROAT WITH OR WITHOUT PRESENCE OF ULCERS  *URINARY PROBLEMS  *BOWEL PROBLEMS  UNUSUAL RASH Items with * indicate a potential emergency and should be followed up as soon as possible. If you have an emergency after office hours please contact your primary care physician or go to the nearest emergency department.  Please call the clinic during office hours if you have any questions or concerns.   You may also contact the Patient Navigator at [phone] should you have any questions or need assistance in obtaining follow up care.      Resources For Cancer Patients and their Caregivers ? American Cancer Society: Can assist with transportation, wigs, general needs, runs Look Good Feel Better.        [phone] ? Cancer Care: Provides financial assistance, online support groups, medication/co-pay assistance.  1-800-813-HOPE 367-731-2831) ? [facility] Assists [place] cancer patients and their families through emotional , educational and financial support.   (667)203-3172 ? [place] DSS Where to apply for food stamps, Medicaid and utility assistance. 805-152-9258 ? [employer]: Transportation to medical appointments. 219-718-9765 ? Social Security Administration: May apply for disability if have a Stage IV cancer. [phone] 770-679-8052 ? [employer], Disability and Transit Services: Assists with nutrition, care and transit needs. [phone]

## 2017-06-29 NOTE — Progress Notes (Signed)
Patient tolerated treatment today without incidence. Patient discharged ambulatory and in stable condition from clinic. Patient to follow up tomorrow as scheduled.

## 2017-06-30 ENCOUNTER — Encounter (HOSPITAL_BASED_OUTPATIENT_CLINIC_OR_DEPARTMENT_OTHER): Payer: Managed Care, Other (non HMO)

## 2017-06-30 VITALS — BP 139/80 | HR 69 | Temp 98.1°F | Resp 18

## 2017-06-30 DIAGNOSIS — C3431 Malignant neoplasm of lower lobe, right bronchus or lung: Secondary | ICD-10-CM | POA: Diagnosis not present

## 2017-06-30 DIAGNOSIS — Z5111 Encounter for antineoplastic chemotherapy: Secondary | ICD-10-CM | POA: Diagnosis not present

## 2017-06-30 DIAGNOSIS — C801 Malignant (primary) neoplasm, unspecified: Secondary | ICD-10-CM

## 2017-06-30 MED ORDER — PROCHLORPERAZINE MALEATE 10 MG PO TABS
10.0000 mg | ORAL_TABLET | Freq: Once | ORAL | Status: AC
  Administered 2017-06-30: 10 mg via ORAL

## 2017-06-30 MED ORDER — SODIUM CHLORIDE 0.9 % IV SOLN
Freq: Once | INTRAVENOUS | Status: AC
  Administered 2017-06-30: 12:00:00 via INTRAVENOUS

## 2017-06-30 MED ORDER — SODIUM CHLORIDE 0.9% FLUSH
10.0000 mL | INTRAVENOUS | Status: DC | PRN
  Administered 2017-06-30: 10 mL
  Filled 2017-06-30: qty 10

## 2017-06-30 MED ORDER — TOPOTECAN HCL CHEMO INJECTION 4 MG
1.5000 mg/m2 | Freq: Once | INTRAVENOUS | Status: AC
  Administered 2017-06-30: 2.6 mg via INTRAVENOUS
  Filled 2017-06-30: qty 2.6

## 2017-06-30 MED ORDER — PROCHLORPERAZINE MALEATE 10 MG PO TABS
ORAL_TABLET | ORAL | Status: AC
  Filled 2017-06-30: qty 1

## 2017-06-30 NOTE — Progress Notes (Signed)
Chemotherapy given today per orders. Patient tolerated it well without problems. Vitals stable and discharged home from clinic ambulatory. Follow up as scheduled.

## 2017-06-30 NOTE — Patient Instructions (Signed)
[facility] Discharge Instructions for Patients Receiving Chemotherapy   Beginning January 23rd 2017 lab work for the Cancer Center will be done in the  Main lab at  on 1st floor. If you have a lab appointment with the Cancer Center please come in thru the  Main Entrance and check in at the main information desk   Today you received the following chemotherapy agents   To help prevent nausea and vomiting after your treatment, we encourage you to take your nausea medication     If you develop nausea and vomiting, or diarrhea that is not controlled by your medication, call the clinic.  The clinic phone number is [phone]. Office hours are Monday-Friday 8:30am-5:00pm.  BELOW ARE SYMPTOMS THAT SHOULD BE REPORTED IMMEDIATELY:  *FEVER GREATER THAN 101.0 F  *CHILLS WITH OR WITHOUT FEVER  NAUSEA AND VOMITING THAT IS NOT CONTROLLED WITH YOUR NAUSEA MEDICATION  *UNUSUAL SHORTNESS OF BREATH  *UNUSUAL BRUISING OR BLEEDING  TENDERNESS IN MOUTH AND THROAT WITH OR WITHOUT PRESENCE OF ULCERS  *URINARY PROBLEMS  *BOWEL PROBLEMS  UNUSUAL RASH Items with * indicate a potential emergency and should be followed up as soon as possible. If you have an emergency after office hours please contact your primary care physician or go to the nearest emergency department.  Please call the clinic during office hours if you have any questions or concerns.   You may also contact the Patient Navigator at [phone] should you have any questions or need assistance in obtaining follow up care.      Resources For Cancer Patients and their Caregivers ? American Cancer Society: Can assist with transportation, wigs, general needs, runs Look Good Feel Better.        1-888-227-6333 ? Cancer Care: Provides financial assistance, online support groups, medication/co-pay assistance.  1-800-813-HOPE (4673) ? [facility] Assists [place] cancer  patients and their families through emotional , educational and financial support.  336-427-4357 ? [place] DSS Where to apply for food stamps, Medicaid and utility assistance. 336-342-1394 ? [employer]: Transportation to medical appointments. 336-347-2287 ? Social Security Administration: May apply for disability if have a Stage IV cancer. 336-342-7796 1-800-772-1213 ? [place] Aging, Disability and Transit Services: Assists with nutrition, care and transit needs. [phone]         

## 2017-07-01 ENCOUNTER — Encounter (HOSPITAL_COMMUNITY): Payer: Managed Care, Other (non HMO) | Attending: Adult Health

## 2017-07-01 VITALS — BP 126/85 | HR 72 | Temp 99.0°F | Resp 18

## 2017-07-01 DIAGNOSIS — C3431 Malignant neoplasm of lower lobe, right bronchus or lung: Secondary | ICD-10-CM

## 2017-07-01 DIAGNOSIS — Z5111 Encounter for antineoplastic chemotherapy: Secondary | ICD-10-CM | POA: Diagnosis not present

## 2017-07-01 DIAGNOSIS — C801 Malignant (primary) neoplasm, unspecified: Secondary | ICD-10-CM

## 2017-07-01 MED ORDER — SODIUM CHLORIDE 0.9% FLUSH
10.0000 mL | INTRAVENOUS | Status: DC | PRN
  Administered 2017-07-01: 10 mL
  Filled 2017-07-01: qty 10

## 2017-07-01 MED ORDER — PROCHLORPERAZINE MALEATE 10 MG PO TABS
10.0000 mg | ORAL_TABLET | Freq: Once | ORAL | Status: AC
  Administered 2017-07-01: 10 mg via ORAL
  Filled 2017-07-01: qty 1

## 2017-07-01 MED ORDER — TOPOTECAN HCL CHEMO INJECTION 4 MG
1.5000 mg/m2 | Freq: Once | INTRAVENOUS | Status: AC
  Administered 2017-07-01: 2.6 mg via INTRAVENOUS
  Filled 2017-07-01: qty 2.6

## 2017-07-01 MED ORDER — PEGFILGRASTIM 6 MG/0.6ML ~~LOC~~ PSKT
6.0000 mg | PREFILLED_SYRINGE | Freq: Once | SUBCUTANEOUS | Status: AC
  Administered 2017-07-01: 6 mg via SUBCUTANEOUS

## 2017-07-01 MED ORDER — HEPARIN SOD (PORK) LOCK FLUSH 100 UNIT/ML IV SOLN
500.0000 [IU] | Freq: Once | INTRAVENOUS | Status: AC | PRN
  Administered 2017-07-01: 500 [IU]
  Filled 2017-07-01: qty 5

## 2017-07-01 MED ORDER — SODIUM CHLORIDE 0.9 % IV SOLN
Freq: Once | INTRAVENOUS | Status: AC
  Administered 2017-07-01: 12:00:00 via INTRAVENOUS

## 2017-07-01 NOTE — Progress Notes (Signed)
Chemotherapy infusion given today per orders. Patient tolerated it well no issues. Marland KitchenClovia Moore arrived today for [facility] neulasta on body injector. See MAR for administration details. Injector in place and engaged with green light indicator on flashing. Tolerated application with out problems. Vitlals stable and discharged home from clinic ambulatory. Follow up as scheduled.

## 2017-07-01 NOTE — Patient Instructions (Signed)
[facility] Discharge Instructions for Patients Receiving Chemotherapy   Beginning January 23rd 2017 lab work for the Cancer Center will be done in the  Main lab at  on 1st floor. If you have a lab appointment with the Cancer Center please come in thru the  Main Entrance and check in at the main information desk   Today you received the following chemotherapy agents   To help prevent nausea and vomiting after your treatment, we encourage you to take your nausea medication     If you develop nausea and vomiting, or diarrhea that is not controlled by your medication, call the clinic.  The clinic phone number is [phone]. Office hours are Monday-Friday 8:30am-5:00pm.  BELOW ARE SYMPTOMS THAT SHOULD BE REPORTED IMMEDIATELY:  *FEVER GREATER THAN 101.0 F  *CHILLS WITH OR WITHOUT FEVER  NAUSEA AND VOMITING THAT IS NOT CONTROLLED WITH YOUR NAUSEA MEDICATION  *UNUSUAL SHORTNESS OF BREATH  *UNUSUAL BRUISING OR BLEEDING  TENDERNESS IN MOUTH AND THROAT WITH OR WITHOUT PRESENCE OF ULCERS  *URINARY PROBLEMS  *BOWEL PROBLEMS  UNUSUAL RASH Items with * indicate a potential emergency and should be followed up as soon as possible. If you have an emergency after office hours please contact your primary care physician or go to the nearest emergency department.  Please call the clinic during office hours if you have any questions or concerns.   You may also contact the Patient Navigator at [phone] should you have any questions or need assistance in obtaining follow up care.      Resources For Cancer Patients and their Caregivers ? American Cancer Society: Can assist with transportation, wigs, general needs, runs Look Good Feel Better.        1-888-227-6333 ? Cancer Care: Provides financial assistance, online support groups, medication/co-pay assistance.  1-800-813-HOPE (4673) ? [facility] Assists [place] cancer  patients and their families through emotional , educational and financial support.  336-427-4357 ? [place] DSS Where to apply for food stamps, Medicaid and utility assistance. 336-342-1394 ? [employer]: Transportation to medical appointments. 336-347-2287 ? Social Security Administration: May apply for disability if have a Stage IV cancer. 336-342-7796 1-800-772-1213 ? [place] Aging, Disability and Transit Services: Assists with nutrition, care and transit needs. [phone]         

## 2017-07-19 ENCOUNTER — Encounter (HOSPITAL_COMMUNITY): Payer: Self-pay

## 2017-07-19 ENCOUNTER — Encounter (HOSPITAL_COMMUNITY): Payer: Managed Care, Other (non HMO) | Attending: Hematology & Oncology

## 2017-07-19 VITALS — BP 142/88 | HR 77 | Temp 98.6°F | Resp 16 | Wt 131.2 lb

## 2017-07-19 DIAGNOSIS — C3431 Malignant neoplasm of lower lobe, right bronchus or lung: Secondary | ICD-10-CM

## 2017-07-19 DIAGNOSIS — Z5111 Encounter for antineoplastic chemotherapy: Secondary | ICD-10-CM

## 2017-07-19 DIAGNOSIS — C801 Malignant (primary) neoplasm, unspecified: Secondary | ICD-10-CM | POA: Insufficient documentation

## 2017-07-19 LAB — COMPREHENSIVE METABOLIC PANEL
ALBUMIN: 3.9 g/dL (ref 3.5–5.0)
ALK PHOS: 80 U/L (ref 38–126)
ALT: 27 U/L (ref 14–54)
ANION GAP: 8 (ref 5–15)
AST: 28 U/L (ref 15–41)
BILIRUBIN TOTAL: 0.4 mg/dL (ref 0.3–1.2)
BUN: 17 mg/dL (ref 6–20)
CALCIUM: 8.8 mg/dL — AB (ref 8.9–10.3)
CO2: 25 mmol/L (ref 22–32)
Chloride: 96 mmol/L — ABNORMAL LOW (ref 101–111)
Creatinine, Ser: 0.64 mg/dL (ref 0.44–1.00)
GFR calc Af Amer: >60 mL/min (ref >60)
GFR calc non Af Amer: >60 mL/min (ref >60)
GLUCOSE: 161 mg/dL — AB (ref 65–99)
Potassium: 3.5 mmol/L (ref 3.5–5.1)
SODIUM: 129 mmol/L — AB (ref 135–145)
TOTAL PROTEIN: 6.4 g/dL — AB (ref 6.5–8.1)

## 2017-07-19 LAB — CBC WITH DIFFERENTIAL/PLATELET
BASOS ABS: 0.1 10*3/uL (ref 0.0–0.1)
Basophils Relative: 1 %
EOS PCT: 1 %
Eosinophils Absolute: 0.1 10*3/uL (ref 0.0–0.7)
HEMATOCRIT: 28.9 % — AB (ref 36.0–46.0)
HEMOGLOBIN: 9.1 g/dL — AB (ref 12.0–15.0)
LYMPHS ABS: 0.7 10*3/uL (ref 0.7–4.0)
Lymphocytes Relative: 10 %
MCH: 29.9 pg (ref 26.0–34.0)
MCHC: 31.5 g/dL (ref 30.0–36.0)
MCV: 95.1 fL (ref 78.0–100.0)
MONOS PCT: 7 %
Monocytes Absolute: 0.5 10*3/uL (ref 0.1–1.0)
NEUTROS PCT: 81 %
Neutro Abs: 5.9 10*3/uL (ref 1.7–7.7)
Platelets: 247 10*3/uL (ref 150–400)
RBC: 3.04 MIL/uL — AB (ref 3.87–5.11)
RDW: 20.6 % — ABNORMAL HIGH (ref 11.5–15.5)
WBC: 7.3 10*3/uL (ref 4.0–10.5)

## 2017-07-19 LAB — MAGNESIUM: Magnesium: 1.7 mg/dL (ref 1.7–2.4)

## 2017-07-19 MED ORDER — ACETAMINOPHEN 325 MG PO TABS
ORAL_TABLET | ORAL | Status: AC
  Filled 2017-07-19: qty 2

## 2017-07-19 MED ORDER — TOPOTECAN HCL CHEMO INJECTION 4 MG
1.5000 mg/m2 | Freq: Once | INTRAVENOUS | Status: AC
  Administered 2017-07-19: 2.6 mg via INTRAVENOUS
  Filled 2017-07-19: qty 2.6

## 2017-07-19 MED ORDER — ACETAMINOPHEN 325 MG PO TABS
650.0000 mg | ORAL_TABLET | Freq: Once | ORAL | Status: AC
  Administered 2017-07-19: 650 mg via ORAL

## 2017-07-19 MED ORDER — SODIUM CHLORIDE 0.9 % IV SOLN
Freq: Once | INTRAVENOUS | Status: AC
  Administered 2017-07-19: 12:00:00 via INTRAVENOUS

## 2017-07-19 MED ORDER — PROCHLORPERAZINE MALEATE 10 MG PO TABS
10.0000 mg | ORAL_TABLET | Freq: Once | ORAL | Status: AC
  Administered 2017-07-19: 10 mg via ORAL
  Filled 2017-07-19: qty 1

## 2017-07-19 MED ORDER — SODIUM CHLORIDE 0.9% FLUSH
10.0000 mL | INTRAVENOUS | Status: DC | PRN
  Administered 2017-07-19: 10 mL
  Filled 2017-07-19: qty 10

## 2017-07-19 NOTE — Patient Instructions (Signed)
[facility] Discharge Instructions for Patients Receiving Chemotherapy   Beginning January 23rd 2017 lab work for the Cancer Center will be done in the  Main lab at [facility] on 1st floor. If you have a lab appointment with the Cancer Center please come in thru the  Main Entrance and check in at the main information desk   Today you received the following chemotherapy agents   To help prevent nausea and vomiting after your treatment, we encourage you to take your nausea medication     If you develop nausea and vomiting, or diarrhea that is not controlled by your medication, call the clinic.  The clinic phone number is [phone]. Office hours are Monday-Friday 8:30am-5:00pm.  BELOW ARE SYMPTOMS THAT SHOULD BE REPORTED IMMEDIATELY:  *FEVER GREATER THAN 101.0 F  *CHILLS WITH OR WITHOUT FEVER  NAUSEA AND VOMITING THAT IS NOT CONTROLLED WITH YOUR NAUSEA MEDICATION  *UNUSUAL SHORTNESS OF BREATH  *UNUSUAL BRUISING OR BLEEDING  TENDERNESS IN MOUTH AND THROAT WITH OR WITHOUT PRESENCE OF ULCERS  *URINARY PROBLEMS  *BOWEL PROBLEMS  UNUSUAL RASH Items with * indicate a potential emergency and should be followed up as soon as possible. If you have an emergency after office hours please contact your primary care physician or go to the nearest emergency department.  Please call the clinic during office hours if you have any questions or concerns.   You may also contact the Patient Navigator at [phone] should you have any questions or need assistance in obtaining follow up care.      Resources For Cancer Patients and their Caregivers ? American Cancer Society: Can assist with transportation, wigs, general needs, runs Look Good Feel Better.        1-888-227-6333 ? Cancer Care: Provides financial assistance, online support groups, medication/co-pay assistance.  1-800-813-HOPE (4673) ? [facility] Assists [place] cancer  patients and their families through emotional , educational and financial support.  336-427-4357 ? [place] DSS Where to apply for food stamps, Medicaid and utility assistance. 336-342-1394 ? [employer]: Transportation to medical appointments. 336-347-2287 ? Social Security Administration: May apply for disability if have a Stage IV cancer. 336-342-7796 1-800-772-1213 ? [place] Aging, Disability and Transit Services: Assists with nutrition, care and transit needs. [phone]         

## 2017-07-19 NOTE — Progress Notes (Signed)
Patient presented today for chemotherapy. Patient complains of headache this morning, thinks she over did it yesterday. Very fatigued today. Otherwise no other complaints.   Labs reviewed with MD, proceed with treatment. Sodium noted by MD.  Treatment given per orders. Patient tolerated it well without problems. Vitals stable and discharged home from clinic ambulatory. Follow up as scheduled.

## 2017-07-20 ENCOUNTER — Encounter (HOSPITAL_BASED_OUTPATIENT_CLINIC_OR_DEPARTMENT_OTHER): Payer: Managed Care, Other (non HMO)

## 2017-07-20 ENCOUNTER — Encounter (HOSPITAL_COMMUNITY): Payer: Self-pay

## 2017-07-20 VITALS — BP 130/78 | HR 72 | Temp 98.9°F | Resp 18 | Wt 129.8 lb

## 2017-07-20 DIAGNOSIS — C3431 Malignant neoplasm of lower lobe, right bronchus or lung: Secondary | ICD-10-CM

## 2017-07-20 DIAGNOSIS — Z5111 Encounter for antineoplastic chemotherapy: Secondary | ICD-10-CM | POA: Diagnosis not present

## 2017-07-20 DIAGNOSIS — C801 Malignant (primary) neoplasm, unspecified: Secondary | ICD-10-CM

## 2017-07-20 MED ORDER — SODIUM CHLORIDE 0.9% FLUSH
10.0000 mL | INTRAVENOUS | Status: DC | PRN
  Administered 2017-07-20: 10 mL
  Filled 2017-07-20: qty 10

## 2017-07-20 MED ORDER — TOPOTECAN HCL CHEMO INJECTION 4 MG
1.5000 mg/m2 | Freq: Once | INTRAVENOUS | Status: AC
  Administered 2017-07-20: 2.6 mg via INTRAVENOUS
  Filled 2017-07-20: qty 2.6

## 2017-07-20 MED ORDER — HEPARIN SOD (PORK) LOCK FLUSH 100 UNIT/ML IV SOLN: 500.0000 [IU] | Freq: Once | INTRAVENOUS | Status: DC | PRN

## 2017-07-20 MED ORDER — PROCHLORPERAZINE MALEATE 10 MG PO TABS
10.0000 mg | ORAL_TABLET | Freq: Once | ORAL | Status: AC
  Administered 2017-07-20: 10 mg via ORAL
  Filled 2017-07-20: qty 1

## 2017-07-20 MED ORDER — SODIUM CHLORIDE 0.9 % IV SOLN
Freq: Once | INTRAVENOUS | Status: AC
  Administered 2017-07-20: 500 mL via INTRAVENOUS

## 2017-07-20 NOTE — Progress Notes (Signed)
For day 2 topetecan with no complaints voiced.  Port flushed without difficulty.    Patient tolerated chemotherapy with no complaints voiced.  Port site clean and dry with no bruising or swelling noted at site.  Port site saline locked.  VSS with discharge.  No complaints voiced and left ambulatory with family.

## 2017-07-20 NOTE — Patient Instructions (Signed)
[facility] Discharge Instructions for Patients Receiving Chemotherapy  Today you received the following chemotherapy agents topetecan.  Call for any questions or concerns.   To help prevent nausea and vomiting after your treatment, we encourage you to take your nausea medication.    If you develop nausea and vomiting that is not controlled by your nausea medication, call the clinic.   BELOW ARE SYMPTOMS THAT SHOULD BE REPORTED IMMEDIATELY:  *FEVER GREATER THAN 100.5 F  *CHILLS WITH OR WITHOUT FEVER  NAUSEA AND VOMITING THAT IS NOT CONTROLLED WITH YOUR NAUSEA MEDICATION  *UNUSUAL SHORTNESS OF BREATH  *UNUSUAL BRUISING OR BLEEDING  TENDERNESS IN MOUTH AND THROAT WITH OR WITHOUT PRESENCE OF ULCERS  *URINARY PROBLEMS  *BOWEL PROBLEMS  UNUSUAL RASH Items with * indicate a potential emergency and should be followed up as soon as possible.  Feel free to call the clinic you have any questions or concerns. The clinic phone number is [phone].  Please show the [facility] at check-in to the Emergency Department and triage nurse.

## 2017-07-21 ENCOUNTER — Encounter (HOSPITAL_COMMUNITY): Payer: Self-pay

## 2017-07-21 ENCOUNTER — Encounter (HOSPITAL_BASED_OUTPATIENT_CLINIC_OR_DEPARTMENT_OTHER): Payer: Managed Care, Other (non HMO)

## 2017-07-21 VITALS — BP 117/81 | HR 85 | Temp 98.7°F | Resp 18 | Wt 130.4 lb

## 2017-07-21 DIAGNOSIS — Z5111 Encounter for antineoplastic chemotherapy: Secondary | ICD-10-CM

## 2017-07-21 DIAGNOSIS — C3431 Malignant neoplasm of lower lobe, right bronchus or lung: Secondary | ICD-10-CM

## 2017-07-21 DIAGNOSIS — C801 Malignant (primary) neoplasm, unspecified: Secondary | ICD-10-CM

## 2017-07-21 MED ORDER — PROCHLORPERAZINE MALEATE 10 MG PO TABS
10.0000 mg | ORAL_TABLET | Freq: Once | ORAL | Status: AC
  Administered 2017-07-21: 10 mg via ORAL

## 2017-07-21 MED ORDER — SODIUM CHLORIDE 0.9% FLUSH
10.0000 mL | INTRAVENOUS | Status: DC | PRN
  Administered 2017-07-21: 10 mL
  Filled 2017-07-21: qty 10

## 2017-07-21 MED ORDER — TOPOTECAN HCL CHEMO INJECTION 4 MG
1.5000 mg/m2 | Freq: Once | INTRAVENOUS | Status: AC
  Administered 2017-07-21: 2.6 mg via INTRAVENOUS
  Filled 2017-07-21: qty 2.6

## 2017-07-21 MED ORDER — SODIUM CHLORIDE 0.9 % IV SOLN
Freq: Once | INTRAVENOUS | Status: AC
  Administered 2017-07-21: 500 mL via INTRAVENOUS

## 2017-07-21 MED ORDER — PROCHLORPERAZINE MALEATE 10 MG PO TABS
ORAL_TABLET | ORAL | Status: AC
Start: 2017-07-21 — End: ?
  Filled 2017-07-21: qty 1

## 2017-07-21 NOTE — Progress Notes (Signed)
To treatment room for Topetecan.  No complaints voiced for today.  Port flushed with good blood return.    Patient tolerated chemotherapy with no complaints voiced.  Port site clean and dry with no bruising or swelling noted at site.  VSS and discharged ambulatory with self.  No complaints voiced with discharge.

## 2017-07-21 NOTE — Patient Instructions (Signed)
[facility] Discharge Instructions for Patients Receiving Chemotherapy  Today you received the following chemotherapy agents topetecan.   To help prevent nausea and vomiting after your treatment, we encourage you to take your nausea medication.    If you develop nausea and vomiting that is not controlled by your nausea medication, call the clinic.   BELOW ARE SYMPTOMS THAT SHOULD BE REPORTED IMMEDIATELY:  *FEVER GREATER THAN 100.5 F  *CHILLS WITH OR WITHOUT FEVER  NAUSEA AND VOMITING THAT IS NOT CONTROLLED WITH YOUR NAUSEA MEDICATION  *UNUSUAL SHORTNESS OF BREATH  *UNUSUAL BRUISING OR BLEEDING  TENDERNESS IN MOUTH AND THROAT WITH OR WITHOUT PRESENCE OF ULCERS  *URINARY PROBLEMS  *BOWEL PROBLEMS  UNUSUAL RASH Items with * indicate a potential emergency and should be followed up as soon as possible.  Feel free to call the clinic you have any questions or concerns. The clinic phone number is [phone].  Please show the [facility] at check-in to the Emergency Department and triage nurse.

## 2017-07-22 ENCOUNTER — Encounter (HOSPITAL_COMMUNITY): Payer: Managed Care, Other (non HMO)

## 2017-07-22 ENCOUNTER — Encounter (HOSPITAL_COMMUNITY): Payer: Self-pay

## 2017-07-22 ENCOUNTER — Encounter (HOSPITAL_BASED_OUTPATIENT_CLINIC_OR_DEPARTMENT_OTHER): Payer: Managed Care, Other (non HMO)

## 2017-07-22 VITALS — BP 115/73 | HR 77 | Temp 98.3°F | Resp 18 | Wt 130.4 lb

## 2017-07-22 DIAGNOSIS — Z5111 Encounter for antineoplastic chemotherapy: Secondary | ICD-10-CM | POA: Diagnosis not present

## 2017-07-22 DIAGNOSIS — C3431 Malignant neoplasm of lower lobe, right bronchus or lung: Secondary | ICD-10-CM | POA: Diagnosis not present

## 2017-07-22 DIAGNOSIS — C801 Malignant (primary) neoplasm, unspecified: Secondary | ICD-10-CM

## 2017-07-22 MED ORDER — PROCHLORPERAZINE MALEATE 10 MG PO TABS: 10.0000 mg | ORAL_TABLET | Freq: Once | ORAL | Status: DC

## 2017-07-22 MED ORDER — SODIUM CHLORIDE 0.9% FLUSH
10.0000 mL | INTRAVENOUS | Status: DC | PRN
  Administered 2017-07-22: 10 mL
  Filled 2017-07-22: qty 10

## 2017-07-22 MED ORDER — SODIUM CHLORIDE 0.9 % IV SOLN
Freq: Once | INTRAVENOUS | Status: AC
  Administered 2017-07-22: 11:00:00 via INTRAVENOUS

## 2017-07-22 MED ORDER — HEPARIN SOD (PORK) LOCK FLUSH 100 UNIT/ML IV SOLN
500.0000 [IU] | Freq: Once | INTRAVENOUS | Status: AC | PRN
  Administered 2017-07-22: 500 [IU]

## 2017-07-22 MED ORDER — TOPOTECAN HCL CHEMO INJECTION 4 MG
1.5000 mg/m2 | Freq: Once | INTRAVENOUS | Status: AC
  Administered 2017-07-22: 2.6 mg via INTRAVENOUS
  Filled 2017-07-22: qty 2.6

## 2017-07-22 NOTE — Progress Notes (Signed)
[place] tolerated Topotecan infusion well without complaints or incident. VSS upon discharge. Pt discharged self ambulatory in satisfactory condition

## 2017-07-22 NOTE — Progress Notes (Signed)
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 Past medical history of HLD, GERD, HTN  Patient seen during infusion this am.  Patient reports that appetite is up and down.  Reports usually has a boost mixed with whole milk daily for added nutrition.  Reports this am ate doughnut for breakfast at home and stopped and got fries on the way to the cancer center.  Patient reports no nutrition related symptoms except poor appetite.    Nutrition Focused Physical Exam: deferred  Medications: Na Cl tabs, zofran, KCL, compazine,   Labs: Na 129, glucose 161  Anthropometrics:   Height: 65 inches Weight: 130 lb 6.4 oz UBW: 150 lb  Noted in Jan 2018 BMI: 21  13% weight loss in the last 8 months  Estimated Energy Needs  Kcals: 1770-2000 calories/d  Protein: 89-100 g/d Fluid: 2 L/d  NUTRITION DIAGNOSIS: Inadequate oral intake related to cancer and cancer related treatments as evidenced by 13% weight loss in 8 months and intake up and down   MALNUTRITION DIAGNOSIS: continue to monitor   INTERVENTION:   Discussed ways to increase calories and protein.  Fact sheet given Encouraged small frequent meals Discussed oral nutrition supplements and difference between supplements.  Encouraged high calorie, high protein supplement. Contact information given and encouraged patient to contact with questions    MONITORING, EVALUATION, GOAL: weight trends, intake   NEXT VISIT: phone call in 1 month  Shailey Butterbaugh B. Zenia Resides, [place], [place] Registered Dietitian [phone] (pager)

## 2017-07-22 NOTE — Patient Instructions (Signed)
[facility] Discharge Instructions for Patients Receiving Chemotherapy   Beginning January 23rd 2017 lab work for the [facility] will be done in the  Main lab at [facility] on 1st floor. If you have a lab appointment with the [facility] please come in thru the  Main Entrance and check in at the main information desk   Today you received the following chemotherapy agents Topotecan. Follow-up as scheduled. Call clinic for any questions or concerns  To help prevent nausea and vomiting after your treatment, we encourage you to take your nausea medication   If you develop nausea and vomiting, or diarrhea that is not controlled by your medication, call the clinic.  The clinic phone number is [phone]. Office hours are Monday-Friday 8:30am-5:00pm.  BELOW ARE SYMPTOMS THAT SHOULD BE REPORTED IMMEDIATELY:  *FEVER GREATER THAN 101.0 F  *CHILLS WITH OR WITHOUT FEVER  NAUSEA AND VOMITING THAT IS NOT CONTROLLED WITH YOUR NAUSEA MEDICATION  *UNUSUAL SHORTNESS OF BREATH  *UNUSUAL BRUISING OR BLEEDING  TENDERNESS IN MOUTH AND THROAT WITH OR WITHOUT PRESENCE OF ULCERS  *URINARY PROBLEMS  *BOWEL PROBLEMS  UNUSUAL RASH Items with * indicate a potential emergency and should be followed up as soon as possible. If you have an emergency after office hours please contact your primary care physician or go to the nearest emergency department.  Please call the clinic during office hours if you have any questions or concerns.   You may also contact the Patient Navigator at [phone] should you have any questions or need assistance in obtaining follow up care.      Resources For Cancer Patients and their Caregivers ? American Cancer Society: Can assist with transportation, wigs, general needs, runs Look Good Feel Better.        317-045-9847 ? Cancer Care: Provides financial assistance, online support groups, medication/co-pay assistance.  1-800-813-HOPE  307-521-7538) ? [facility] Assists [place] cancer patients and their families through emotional , educational and financial support.  [phone] [phone] ? [place] DSS Where to apply for food stamps, Medicaid and utility assistance. 212-527-1458 ? RCATS: Transportation to medical appointments. 310-067-2112 ? Social Security Administration: May apply for disability if have a Stage IV cancer. [phone] ? [employer], Disability and Transit Services: Assists with nutrition, care and transit needs. [phone]

## 2017-07-25 ENCOUNTER — Encounter (HOSPITAL_BASED_OUTPATIENT_CLINIC_OR_DEPARTMENT_OTHER): Payer: Managed Care, Other (non HMO)

## 2017-07-25 ENCOUNTER — Encounter (HOSPITAL_BASED_OUTPATIENT_CLINIC_OR_DEPARTMENT_OTHER): Payer: Managed Care, Other (non HMO) | Admitting: Oncology

## 2017-07-25 ENCOUNTER — Encounter (HOSPITAL_COMMUNITY): Payer: Self-pay

## 2017-07-25 VITALS — BP 126/87 | HR 74 | Temp 98.0°F | Resp 18 | Wt 131.0 lb

## 2017-07-25 DIAGNOSIS — C3431 Malignant neoplasm of lower lobe, right bronchus or lung: Secondary | ICD-10-CM | POA: Diagnosis not present

## 2017-07-25 DIAGNOSIS — R42 Dizziness and giddiness: Secondary | ICD-10-CM

## 2017-07-25 DIAGNOSIS — E871 Hypo-osmolality and hyponatremia: Secondary | ICD-10-CM | POA: Diagnosis not present

## 2017-07-25 DIAGNOSIS — C801 Malignant (primary) neoplasm, unspecified: Secondary | ICD-10-CM

## 2017-07-25 DIAGNOSIS — Z720 Tobacco use: Secondary | ICD-10-CM

## 2017-07-25 DIAGNOSIS — Z5111 Encounter for antineoplastic chemotherapy: Secondary | ICD-10-CM | POA: Diagnosis not present

## 2017-07-25 DIAGNOSIS — E222 Syndrome of inappropriate secretion of antidiuretic hormone: Secondary | ICD-10-CM | POA: Diagnosis not present

## 2017-07-25 MED ORDER — HEPARIN SOD (PORK) LOCK FLUSH 100 UNIT/ML IV SOLN
500.0000 [IU] | Freq: Once | INTRAVENOUS | Status: AC | PRN
  Administered 2017-07-25: 500 [IU]
  Filled 2017-07-25: qty 5

## 2017-07-25 MED ORDER — TOPOTECAN HCL CHEMO INJECTION 4 MG
1.5000 mg/m2 | Freq: Once | INTRAVENOUS | Status: AC
  Administered 2017-07-25: 2.6 mg via INTRAVENOUS
  Filled 2017-07-25: qty 2.6

## 2017-07-25 MED ORDER — PEGFILGRASTIM 6 MG/0.6ML ~~LOC~~ PSKT
6.0000 mg | PREFILLED_SYRINGE | Freq: Once | SUBCUTANEOUS | Status: AC
  Administered 2017-07-25: 6 mg via SUBCUTANEOUS
  Filled 2017-07-25: qty 0.6

## 2017-07-25 MED ORDER — PROCHLORPERAZINE MALEATE 10 MG PO TABS
10.0000 mg | ORAL_TABLET | Freq: Once | ORAL | Status: AC
  Administered 2017-07-25: 10 mg via ORAL
  Filled 2017-07-25: qty 1

## 2017-07-25 MED ORDER — SODIUM CHLORIDE 0.9 % IV SOLN
Freq: Once | INTRAVENOUS | Status: AC
  Administered 2017-07-25: 11:00:00 via INTRAVENOUS

## 2017-07-25 NOTE — Progress Notes (Signed)
Tolerated tx w/o adverse reaction.  Alert, in no distress.  VSS.  Discharged ambulatory in c/o family.

## 2017-07-25 NOTE — Progress Notes (Signed)
Practice, [facility] [place] [id number]  No diagnosis found.  CURRENT THERAPY: Salvage Topotecan therapy beginning on 02/28/2017.  INTERVAL HISTORY: Lydia Moore 56 y.o. female returns for followup of small cell lung cancer right lung, limited stage, at time of diagnosis, presenting with SIADH.  S/P 6 cycles of cisplatin/etoposide (07/27/2016-11/11/2016) curative intent.  She was evaluated by radiation oncology for involved field XRT during chemotherapy, but due to tumor burden, she was deemed not an XRT candidate.  She refused whole brain XRT in the prophylactic setting following systemic chemotherapy.  Short interval relapse of disease less than 6 months on restaging CT scans on 02/21/2017 leading to salvage treatment with Topotecan for recurrent rash extensive stage small cell lung cancer.  S/P second opinion at [place] with Dr. Lissa Morales on 03/10/2017.    Small cell carcinoma ([place])   07/12/2016 Imaging    CTA chest 3.9 x 3.5 cm right lower lobe mass with imaging features most compatible with a primary lung carcinoma.Marked metastatic right hilar and mediastinal adenopathy including a confluent mass of adenopathy in the right hilum, encasing and causing marked narrowing of the central pulmonary arteries on the right with a short segment of occlusion of the right lower lobe bronchus.Mild postobstructive changes in the inferior, medial right lower lobe.Mild changes of COPD       07/16/2016 Procedure    Endoscopic bronchoscopy with transbronchial biopsy of #7 nodes and biopsy of RLL lung mass      07/16/2016 Pathology Results    Lung, biopsy, Right Lower Lobe - SMALL CELL CARCINOMA.       07/21/2016 Imaging    MRI brain Negative MRI of the brain. No evidence for metastatic disease to the brain or meninges.      07/26/2016 PET scan    Markedly hypermetabolic right lower lobe lesion with hypermetabolic metastatic disease in the right hilum  and mediastinum. 2. Several scattered hypermetabolic ground-glass opacities in the right upper lobe. FDG accumulation within these nodules is higher than typically seen for infectious/inflammatory etiology although this remains within the differential. Atypical appearance of metastatic spread would also be a consideration. 3. Coronary artery atherosclerosis. 4. **An incidental finding of potential clinical significance has been found. 3.8 cm abdominal aortic aneurysm       07/27/2016 - 11/11/2016 Chemotherapy    Cisplatin/Etoposide x 6 cycles      11/09/2016 Treatment Plan Change    Patient declined/refused whole brain radiation.      11/23/2016 Imaging    CT chest with No new or progressive findings in the chest. 2. Further interval decrease in size of mediastinal lymphadenopathy. 3. Only trace scarring visible in the right lower lobe, at the location of the previous right lower lobe mass. 4. Coronary artery and thoracoabdominal aortic atherosclerosis      02/21/2017 Imaging    CT CAP- 1. Unfortunately the patient has had significant recurrence with marked mediastinal and right infrahilar adenopathy as well as suspected masses in the collapsed right lower lobe. The confluent adenopathy completely occlude the right lower lobe and right middle lobe bronchi, although there is some air trapping in the right middle lobe ; the right lower lobe is completely collapsed around several right lower lobe masses. 2. There is some speckled density in the left anterior sixth rib which is not entirely specific but which is concerning for early osseous metastatic disease given that it was not present on 11/23/2016. Consider a bone  scan to further assess the skeleton for other occult bony spread. 3. Infrarenal abdominal aortic aneurysm 4.2 cm in diameter, increased from the prior measurement of 3.8 cm on 07/26/2016. Although this is only minor increase, it has occurred in a 7 month period. The  patient has a previous existing relationship of cardiothoracic surgery and surveillance of this aneurysm by surgery is appropriate. 4. Other imaging findings of potential clinical significance: Coronary, aortic arch, and branch vessel atherosclerotic vascular disease. Mild lumbar spondylosis and degenerative disc disease.      02/22/2017 Relapse/Recurrence    Last Treatment Date: 11/11/2016 Recent Lab Values: Recent Lab Values: N/A      02/28/2017 -  Chemotherapy    Topotecan       03/08/2017 Imaging    MRI brain- 1. No evidence of metastatic disease. 2. Intermittently motion degraded.      03/08/2017 Imaging    Bone scan: IMPRESSION: No evidence skeletal metastasis by bone scintigraphy.      03/10/2017 Miscellaneous    Consult at [facility] with Dr. Aniceto Boss:  Following Topotecan therapy, she may be a candidate for immunotherapy at time of progression/relapse, with Nivolumab versus Nivolumab/Ipilumumab.  Response rates with single-agent immunotherapy is ~ 10% and with combo therapy 20-30%.  Responses are durable.  Rate of side effects from therapy are ~ 5% and 20-30%.  This therapy is not yet FDA approved and therefore if this therapy option is approved, prior approval from insurance company will be needed.  Dr. Aniceto Boss favors the combination therapy.  Additionally, she may be eligible for a clinical trial (ie Rova-T + nivolumab/ipilumumab).      04/07/2017 Imaging    CT CAP- 1. Persistent but improved bulky mediastinal and right hilar/infrahilar lymphadenopathy and significant decrease in size of the right lower lobe mass and obstructive pneumonitis. Residual branching endobronchial tumor in the right lower lobe. 2. No findings for pulmonary metastatic nodules or abdominal/pelvic lymphadenopathy. 3. Stable infrarenal abdominal aortic aneurysm.      04/12/2017 Treatment Plan Change    Tx deferred x 1 week due to hyponatremia requiring work-up.       Patient presents for  follow-up today.Patient states that she has been doing well . She still has episodes of intermittent dizziness. She states they are not frequent and only happens a few times in the past 6 months. She stated she felt it today after she ate some country ham for breakfast and got up, and noted that her BP has been higher today and she does not know if its related to her increased BP. She denies any chest pain, shortness breath, abdominal pain. She has been tolerating topotecan well with minimal side effects. She has been taking her salt tablets at home.  Review of Systems  Constitutional: Negative.  Negative for chills, fever and weight loss.  HENT: Negative.  Negative for hearing loss, sore throat and tinnitus.   Eyes: Negative.  Negative for blurred vision, photophobia and discharge.  Respiratory: Negative.  Negative for cough, hemoptysis, shortness of breath and wheezing.   Cardiovascular: Negative.  Negative for chest pain, palpitations, orthopnea, claudication and leg swelling.  Gastrointestinal: Negative.  Negative for abdominal pain, blood in stool, constipation, diarrhea, melena, nausea and vomiting.  Genitourinary: Negative.  Negative for dysuria and hematuria.  Musculoskeletal: Negative.  Negative for back pain, joint pain and myalgias.  Skin: Negative.  Negative for itching and rash.  Neurological: Negative for dizziness, tremors, sensory change, speech change, focal weakness, seizures, loss of consciousness and weakness.  Endo/Heme/Allergies: Negative.  Negative for environmental allergies and polydipsia. Does not bruise/bleed easily.  Psychiatric/Behavioral: Negative.  Negative for depression. The patient is not nervous/anxious and does not have insomnia.     Past Medical History:  Diagnosis Date  . Elevated cholesterol   . GERD (gastroesophageal reflux disease)   . GI bleed   . Goals of care, counseling/discussion 03/21/2017  . Hypertension   . 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 Small cell carcinoma ([place]) 07/21/2016    Past Surgical History:  Procedure Laterality Date  . ABDOMINAL HYSTERECTOMY     partial  . CESAREAN SECTION    . COLONOSCOPY    . PORTACATH PLACEMENT Left 08/04/2016   Procedure: INSERTION PORT-A-CATH LEFT SUBCLAVIAN;  Surgeon: Aviva Signs, MD;  Location: [facility];  Service: General;  Laterality: Left;  . TONSILLECTOMY    . VIDEO BRONCHOSCOPY WITH ENDOBRONCHIAL ULTRASOUND N/A 07/16/2016   Procedure: VIDEO BRONCHOSCOPY WITH ENDOBRONCHIAL ULTRASOUND with ultrasound transbronchial biopsy of node #7 and right lower lobe lesion;  Surgeon: Grace Isaac, MD;  Location: [facility] OR;  Service: Thoracic;  Laterality: N/A;    Family History  Problem Relation Age of Onset  . Heart disease Mother   . Heart disease Father   . COPD Sister   . HIV Brother     Social History   Social History  . Marital status: Married    Spouse name: N/A  . Number of children: 4  . Years of education: N/A   Occupational History  . [employer]   Social History Main Topics  . Smoking status: Current Every Day Smoker    Packs/day: 0.50    Years: 35.00    Types: Cigarettes  . Smokeless tobacco: Never Used  . Alcohol use Yes     Comment: occasionally, Couple drinks per mo  . Drug use: Yes    Frequency: 1.0 time per week    Types: Marijuana     Comment: weekly  . Sexual activity: Yes    Birth control/ protection: Surgical     Comment: married   Other Topics Concern  . None   Social History Narrative   Lives w/ husband     PHYSICAL EXAMINATION  ECOG PERFORMANCE STATUS: 0 - Asymptomatic  There were no vitals filed for this visit.  Vitals - 1 value per visit 02/21/8118  SYSTOLIC 147  DIASTOLIC 74  Pulse 74  Temperature 97.9  Respirations 18  Weight (lb) 142.6    Physical Exam  Constitutional: She is oriented to person, place, and time and well-developed, well-nourished, and in no distress. No distress.  HENT:   Head: Normocephalic and atraumatic.  Mouth/Throat: No oropharyngeal exudate.  Eyes: Pupils are equal, round, and reactive to light. Conjunctivae are normal. No scleral icterus.  Neck: Normal range of motion. Neck supple. No JVD present.  Cardiovascular: Normal rate, regular rhythm and normal heart sounds.  Exam reveals no gallop and no friction rub.   No murmur heard. Pulmonary/Chest: Breath sounds normal. No respiratory distress. She has no wheezes. She has no rales.  Abdominal: Soft. Bowel sounds are normal. She exhibits no distension. There is no tenderness. There is no guarding.  Musculoskeletal: She exhibits no edema or tenderness.  Lymphadenopathy:    She has no cervical adenopathy.  Neurological: She is alert and oriented to person, place, and time. No cranial nerve deficit.  Skin: Skin is warm and dry. No rash noted. No erythema. No pallor.  Psychiatric: Affect and  judgment normal.     LABORATORY DATA: CBC    Component Value Date/Time   WBC 7.3 07/19/2017 1046   RBC 3.04 (L) 07/19/2017 1046   HGB 9.1 (L) 07/19/2017 1046   HCT 28.9 (L) 07/19/2017 1046   PLT 247 07/19/2017 1046   MCV 95.1 07/19/2017 1046   MCH 29.9 07/19/2017 1046   MCHC 31.5 07/19/2017 1046   RDW 20.6 (H) 07/19/2017 1046   LYMPHSABS 0.7 07/19/2017 1046   MONOABS 0.5 07/19/2017 1046   EOSABS 0.1 07/19/2017 1046   BASOSABS 0.1 07/19/2017 1046      Chemistry      Component Value Date/Time   NA 129 (L) 07/19/2017 1046   K 3.5 07/19/2017 1046   CL 96 (L) 07/19/2017 1046   CO2 25 07/19/2017 1046   BUN 17 07/19/2017 1046   CREATININE 0.64 07/19/2017 1046      Component Value Date/Time   CALCIUM 8.8 (L) 07/19/2017 1046   ALKPHOS 80 07/19/2017 1046   AST 28 07/19/2017 1046   ALT 27 07/19/2017 1046   BILITOT 0.4 07/19/2017 1046        PENDING LABS:   RADIOGRAPHIC STUDIES:  No results found.   PATHOLOGY:    ASSESSMENT:  1. Small cell lung cancer of right lung, limited stage at time  of Dx, presenting with SIADH.  S/P 6 cycles of Cisplatin/Etoposide (07/27/2016- 11/11/2016) with curative intent.  She was evaluated by radiation oncology for involved field XRT during chemotherapy, but due to tumor burden, she was deemed not an XRT candidate.  She refused whole brain XRT in the prophylactic setting following systemic chemotherapy.  Short interval relapse of disease <6 months on restaging CT imaging on 02/21/2017 leading to salvage treatment with Topotecan for recurrent/extensive stage SCLC.  S/P second opinion at [place] with Dr. Lissa Morales on 03/10/2017.  -Continue chemotherapy as scheduled; she will get cycle 6 day 5 of topotecan today. Reviewed her labs.  -Plan to repeat her restaging scans after cycle 6. If she is responding to topotecan, per NCCN guidelines for relapsed/progressive disease we may continue topotecan until 2 cycles beyond best response. -If she has evidence of progression on her next scans, per [facility] recommendations, we can consider nivolumab/ipilimumab. [facility] also has a trial of nivolumab/ipilimumab and Rova-T and she may be a candidate for that trial at time of disease progression.  2. 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 Treatment of this is to treat the underlying disease. - Patient is asymptomatic. - Sodium improving, up to 129 today. - Advised patient to continue her sodium tablets PO TID. - I have advised patient to fluid restrict to less than a liter of fluids daily. - Encouraged patient to increase salt intake. - Potentially can add a loop diuretic with lasix 20mg  PO BID if worsening sodium level on fluid restriction and sodium tablets.  - if patient should become symptomatic in the future from hyponatremia, such as developing seizures, then she can be placed on tolvaptan 15 mg PO daily.  3. Intermittent dizziness - Unclear of underlying etiology at this time, may be BPV. Will continue  observing.   RTC in 4 weeks for follow up with repeat CT C/A/P and MRI Brain w/ and w/o contrast.  THERAPY PLAN:  Topotecan  All questions were answered. The patient knows to call the clinic with any problems, questions or concerns. We can certainly see the patient much sooner if necessary.   This  note is electronically signed by: Twana First, MD 07/25/2017 11:42 AM

## 2017-08-04 ENCOUNTER — Other Ambulatory Visit: Payer: Self-pay

## 2017-08-04 ENCOUNTER — Emergency Department (HOSPITAL_COMMUNITY): Payer: Managed Care, Other (non HMO)

## 2017-08-04 ENCOUNTER — Encounter (HOSPITAL_COMMUNITY): Payer: Self-pay | Admitting: Emergency Medicine

## 2017-08-04 ENCOUNTER — Emergency Department (HOSPITAL_COMMUNITY)
Admission: EM | Admit: 2017-08-04 | Discharge: 2017-08-04 | Disposition: A | Discharge status: Home / Self Care | Payer: Managed Care, Other (non HMO) | Attending: Emergency Medicine | Admitting: Emergency Medicine

## 2017-08-04 DIAGNOSIS — R079 Chest pain, unspecified: Secondary | ICD-10-CM | POA: Diagnosis present

## 2017-08-04 DIAGNOSIS — Z79899 Other long term (current) drug therapy: Secondary | ICD-10-CM | POA: Diagnosis not present

## 2017-08-04 DIAGNOSIS — F17210 Nicotine dependence, cigarettes, uncomplicated: Secondary | ICD-10-CM | POA: Diagnosis not present

## 2017-08-04 DIAGNOSIS — R590 Localized enlarged lymph nodes: Secondary | ICD-10-CM | POA: Diagnosis not present

## 2017-08-04 DIAGNOSIS — I951 Orthostatic hypotension: Secondary | ICD-10-CM | POA: Diagnosis not present

## 2017-08-04 DIAGNOSIS — R0789 Other chest pain: Secondary | ICD-10-CM | POA: Diagnosis not present

## 2017-08-04 DIAGNOSIS — C3490 Malignant neoplasm of unspecified part of unspecified bronchus or lung: Secondary | ICD-10-CM | POA: Diagnosis not present

## 2017-08-04 DIAGNOSIS — Z720 Tobacco use: Secondary | ICD-10-CM

## 2017-08-04 DIAGNOSIS — J441 Chronic obstructive pulmonary disease with (acute) exacerbation: Secondary | ICD-10-CM | POA: Insufficient documentation

## 2017-08-04 LAB — BASIC METABOLIC PANEL
ANION GAP: 11 (ref 5–15)
BUN: 16 mg/dL (ref 6–20)
CALCIUM: 9 mg/dL (ref 8.9–10.3)
CO2: 27 mmol/L (ref 22–32)
CREATININE: 0.94 mg/dL (ref 0.44–1.00)
Chloride: 94 mmol/L — ABNORMAL LOW (ref 101–111)
Glucose, Bld: 101 mg/dL — ABNORMAL HIGH (ref 65–99)
Potassium: 3 mmol/L — ABNORMAL LOW (ref 3.5–5.1)
SODIUM: 132 mmol/L — AB (ref 135–145)

## 2017-08-04 LAB — CBC
HCT: 25.6 % — ABNORMAL LOW (ref 36.0–46.0)
HEMOGLOBIN: 8.6 g/dL — AB (ref 12.0–15.0)
MCH: 30.8 pg (ref 26.0–34.0)
MCHC: 33.6 g/dL (ref 30.0–36.0)
MCV: 91.8 fL (ref 78.0–100.0)
PLATELETS: 57 10*3/uL — AB (ref 150–400)
RBC: 2.79 MIL/uL — AB (ref 3.87–5.11)
RDW: 20.6 % — ABNORMAL HIGH (ref 11.5–15.5)
WBC: 15.9 10*3/uL — AB (ref 4.0–10.5)

## 2017-08-04 LAB — I-STAT TROPONIN, ED: TROPONIN I, POC: 0.02 ng/mL (ref 0.00–0.08)

## 2017-08-04 MED ORDER — HEPARIN SOD (PORK) LOCK FLUSH 100 UNIT/ML IV SOLN
INTRAVENOUS | Status: AC
  Administered 2017-08-04: 500 [IU]
  Filled 2017-08-04: qty 5

## 2017-08-04 MED ORDER — ALBUTEROL SULFATE HFA 108 (90 BASE) MCG/ACT IN AERS
1.0000 | INHALATION_SPRAY | RESPIRATORY_TRACT | Status: DC | PRN
  Administered 2017-08-04: 2 via RESPIRATORY_TRACT
  Filled 2017-08-04: qty 6.7

## 2017-08-04 MED ORDER — AEROCHAMBER PLUS FLO-VU MEDIUM MISC
1.0000 | Freq: Once | Status: AC
  Administered 2017-08-04: 1
  Filled 2017-08-04: qty 1

## 2017-08-04 MED ORDER — IOPAMIDOL (ISOVUE-370) INJECTION 76%
100.0000 mL | Freq: Once | INTRAVENOUS | Status: AC | PRN
  Administered 2017-08-04: 100 mL via INTRAVENOUS

## 2017-08-04 MED ORDER — PREDNISONE 10 MG (21) PO TBPK: ORAL_TABLET | ORAL | 10 refills | Status: DC

## 2017-08-04 MED ORDER — SODIUM CHLORIDE 0.9 % IV BOLUS (SEPSIS)
1000.0000 mL | Freq: Once | INTRAVENOUS | Status: AC
  Administered 2017-08-04: 1000 mL via INTRAVENOUS

## 2017-08-04 MED ORDER — METHYLPREDNISOLONE SODIUM SUCC 125 MG IJ SOLR
125.0000 mg | Freq: Once | INTRAMUSCULAR | Status: AC
  Administered 2017-08-04: 125 mg via INTRAVENOUS
  Filled 2017-08-04: qty 2

## 2017-08-04 MED ORDER — IPRATROPIUM-ALBUTEROL 0.5-2.5 (3) MG/3ML IN SOLN
3.0000 mL | Freq: Once | RESPIRATORY_TRACT | Status: AC
  Administered 2017-08-04: 3 mL via RESPIRATORY_TRACT
  Filled 2017-08-04: qty 3

## 2017-08-04 MED ORDER — AEROCHAMBER PLUS W/MASK MISC: 1.0000 | Freq: Once | Status: DC

## 2017-08-04 NOTE — ED Triage Notes (Signed)
Patient brought in via EMS from home. Alert and oriented. Airway patent. Per patient sudden chest tightness with generalized weakness, shortness of breath, and diaphoresis while watching TV. Patient reports nausea and "coughing up thick sputum." Patient notable diaphoretic. Per patient tightness has improved greatly. EMS reports patient O@ sat 88-90% upon their arrival. O2 sat now 94% on room air. Patient has stage 4 lung cancer. Denies wearing oxygen at home. Last chemo treatment was 2 weeks ago.

## 2017-08-04 NOTE — Discharge Instructions (Signed)
Try to stop smoking. °

## 2017-08-04 NOTE — ED Provider Notes (Signed)
[facility] DEPT Provider Note   CSN: [id number] Arrival date & time: 08/04/17  1154     History   Chief Complaint Chief Complaint  Patient presents with  . Chest Pain    HPI Lydia Moore is a 56 y.o. female.  Pt presents to the ED today with sob and cp.  Pt has a hx of small cell lung cancer.  She had chemotherapy about a week ago.  Pt said she was doing "regular things" at home and suddenly felt extremely sob.  She also had some cp.  Her husband initially tried to take her to the hospital, but she could not walk to the car, so EMS brought her here.  Pt also has a hx of siadh and has been taking sodium pills.  She has also been fluid restricting to less than a liter of fluids daily.  The pt has been having problems with dizziness as well.   Per EMS, her O2 sat was 88-90% on RA.  Pt does not wear oxygen at home.  She does continue to smoke.  She has had nausea and has been coughing up thick sputum.       Past Medical History:  Diagnosis Date  . Elevated cholesterol   . GERD (gastroesophageal reflux disease)   . GI bleed   . Goals of care, counseling/discussion 03/21/2017  . Hypertension   . 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 Small cell carcinoma ([place]) 07/21/2016    Patient Active Problem List   Diagnosis Date Noted  . Goals of care, counseling/discussion 03/21/2017  . 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 SIADH (syndrome of inappropriate ADH production) ([place]) 08/17/2016  . Tobacco abuse 08/17/2016  . Small cell carcinoma ([place]) 07/21/2016  . Melena 04/17/2012    Past Surgical History:  Procedure Laterality Date  . ABDOMINAL HYSTERECTOMY     partial  . CESAREAN SECTION    . COLONOSCOPY    . PORTACATH PLACEMENT Left 08/04/2016   Procedure: INSERTION PORT-A-CATH LEFT SUBCLAVIAN;  Surgeon: Aviva Signs, MD;  Location: [facility];  Service: General;  Laterality: Left;  . TONSILLECTOMY    . VIDEO BRONCHOSCOPY WITH  ENDOBRONCHIAL ULTRASOUND N/A 07/16/2016   Procedure: VIDEO BRONCHOSCOPY WITH ENDOBRONCHIAL ULTRASOUND with ultrasound transbronchial biopsy of node #7 and right lower lobe lesion;  Surgeon: Grace Isaac, MD;  Location: [facility];  Service: Thoracic;  Laterality: N/A;    OB History    No data available       Home Medications    Prior to Admission medications   Medication Sig Start Date End Date Taking? Authorizing Provider  amLODipine (NORVASC) 5 MG tablet Take 1 tablet (5 mg total) by mouth daily. 07/12/16  Yes Rancour, Annie Main, MD  DULoxetine (CYMBALTA) 60 MG capsule TAKE ONE CAPSULE BY MOUTH EVERY DAY 06/27/17  Yes Holley Bouche, NP  gabapentin (NEURONTIN) 300 MG capsule Take 1 capsule (300 mg total) by mouth 4 (four) times daily. Patient taking differently: Take 600 mg by mouth at bedtime.  02/22/17  Yes Baird Cancer, PA-C  lidocaine-prilocaine (EMLA) cream Apply to affected area once 02/24/17  Yes Kefalas, Manon Hilding, PA-C  ondansetron (ZOFRAN) 8 MG tablet TAKE 1 TABLET BY MOUTH TWICE A DAY AS NEEDED START ON 3RD DAY AFTER CISPATIN CHEMO 06/17/17  Yes Holley Bouche, NP  potassium chloride SA (K-DUR,KLOR-CON) 20 MEQ tablet Take 2 tablets (40 mEq total) by mouth daily. 03/21/17  Yes Baird Cancer, PA-C  sodium chloride 1 g tablet Take 1 tablet (1 g total) by mouth 3 (three) times daily with meals. 04/18/17  Yes Baird Cancer, PA-C  topotecan in sodium chloride 0.9 % 100 mL Inject into the vein once. Days 1-5 every 21 days   Yes [provider]  zolpidem (AMBIEN) 10 MG tablet Take 1 tablet (10 mg total) by mouth at bedtime as needed for sleep. 04/12/17 08/04/17 Yes Kefalas, Manon Hilding, PA-C  loratadine (CLARITIN) 10 MG tablet Take 1 tablet (10 mg total) by mouth daily. 06/28/17   Twana First, MD  predniSONE (STERAPRED UNI-PAK 21 TAB) 10 MG (21) TBPK tablet Take 6 tabs by mouth daily  for 2 days, then 5 tabs for 2 days, then 4 tabs for 2 days, then 3 tabs for 2 days, 2 tabs  for 2 days, then 1 tab by mouth daily for 2 days 08/04/17   Isla Pence, MD  prochlorperazine (COMPAZINE) 10 MG tablet Take 1 tablet (10 mg total) by mouth every 6 (six) hours as needed (Nausea or vomiting). 02/24/17   Baird Cancer, PA-C    Family History Family History  Problem Relation Age of Onset  . Heart disease Mother   . Heart disease Father   . COPD Sister   . HIV Brother     Social History Social History  Substance Use Topics  . Smoking status: Current Every Day Smoker    Packs/day: 0.50    Years: 35.00    Types: Cigarettes  . Smokeless tobacco: Never Used  . Alcohol use Yes     Comment: occasionally, Couple drinks per mo     Allergies   Patient has no known allergies.   Review of Systems Review of Systems  Respiratory: Positive for cough and shortness of breath.   Cardiovascular: Positive for chest pain.  Neurological: Positive for light-headedness.  All other systems reviewed and are negative.    Physical Exam Updated Vital Signs BP 112/67 (BP Location: Right Arm)   Pulse 60   Temp (!) 97.5 F (36.4 C) (Oral)   Resp 18   Ht 5\' 5"  (1.651 m)   Wt 59 kg (130 lb)   SpO2 95%   BMI 21.63 kg/m   Physical Exam  Constitutional: She is oriented to person, place, and time. She appears well-developed and well-nourished.  HENT:  Head: Normocephalic and atraumatic.  Right Ear: External ear normal.  Left Ear: External ear normal.  Nose: Nose normal.  Mouth/Throat: Oropharynx is clear and moist.  Eyes: Pupils are equal, round, and reactive to light. Conjunctivae and EOM are normal.  Neck: Normal range of motion. Neck supple.  Cardiovascular: Normal rate, regular rhythm, normal heart sounds and intact distal pulses.   Pulmonary/Chest: She has wheezes.  Abdominal: Soft. Bowel sounds are normal.  Musculoskeletal: Normal range of motion.  Neurological: She is alert and oriented to person, place, and time.  Skin: Skin is warm. Capillary refill takes  less than 2 seconds.  Psychiatric: She has a normal mood and affect. Her behavior is normal. Judgment and thought content normal.  Nursing note and vitals reviewed.    ED Treatments / Results  Labs (all labs ordered are listed, but only abnormal results are displayed) Labs Reviewed  BASIC METABOLIC PANEL - Abnormal; Notable for the following:       Result Value   Sodium 132 (*)    Potassium 3.0 (*)    Chloride 94 (*)    Glucose, Bld 101 (*)  All other components within normal limits  CBC - Abnormal; Notable for the following:    WBC 15.9 (*)    RBC 2.79 (*)    Hemoglobin 8.6 (*)    HCT 25.6 (*)    RDW 20.6 (*)    Platelets 57 (*)    All other components within normal limits  I-STAT TROPONIN, ED    EKG  EKG Interpretation None       Radiology Ct Angio Chest Pe W Or Wo Contrast  Result Date: 08/04/2017 CLINICAL DATA:  Chest pain, shortness of breath. Current history of small cell lung cancer. EXAM: CT ANGIOGRAPHY CHEST WITH CONTRAST TECHNIQUE: Multidetector CT imaging of the chest was performed using the standard protocol during bolus administration of intravenous contrast. Multiplanar CT image reconstructions and MIPs were obtained to evaluate the vascular anatomy. CONTRAST:  100 mL of Isovue 370 intravenously. COMPARISON:  CT scan of Apr 07, 2017. FINDINGS: Cardiovascular: Satisfactory opacification of the pulmonary arteries to the segmental level. No evidence of pulmonary embolism. Normal heart size. No pericardial effusion. Atherosclerosis of thoracic aorta is noted without aneurysm or dissection. Coronary artery calcifications are noted. Mediastinum/Nodes: Mediastinal adenopathy noted on prior exam is significantly enlarged currently. Subcarinal adenopathy measures 6.5 x 5.2 cm and appears to result in compression of the right lower lobe bronchus. 7.5 x 4.8 cm right hilar adenopathy is noted. 5.8 x 4.6 cm right peritracheal adenopathy is noted. The esophagus is  unremarkable. Thyroid gland appears normal. Lungs/Pleura: No pneumothorax or pleural effusion is noted. Left lung is clear. Mild right basilar subsegmental atelectasis is noted. Upper Abdomen: No acute abnormality. Musculoskeletal: No chest wall abnormality. No acute or significant osseous findings. Review of the MIP images confirms the above findings. IMPRESSION: No definite evidence of pulmonary embolus. Significantly enlarged mediastinal adenopathy is noted as described above, consistent with worsening metastatic disease. Aortic Atherosclerosis (ICD10-I70.0). Electronically Signed   By: Marijo Conception, M.D.   On: 08/04/2017 15:35   Dg Chest Port 1 View  Result Date: 08/04/2017 CLINICAL DATA:  Stage IV lung cancer. Dizziness. Sweating. Difficulty breathing and coughing. EXAM: PORTABLE CHEST 1 VIEW COMPARISON:  CT 04/07/2017. FINDINGS: PowerPort catheter noted with tip projected superior vena cava. Mild cardiomegaly. No pulmonary venous congestion. Mediastinal and and hilar fullness consistent with adenopathy again noted . Right infrahilar mass may still be present. Minimal atelectasis scratched it mild atelectasis right lung base. Tiny right pleural effusion cannot be excluded. No acute bony abnormality identified. IMPRESSION: 1. PowerPort catheter with tip over the superior vena cava. 2. Prominent mediastinal and right hilar fullness consistent with severe adenopathy again noted . Right infrahilar mass lesion may remain. Mild right base atelectasis. Small right pleural effusion . No acute abnormality identified . Electronically Signed   By: Marcello Moores  Register   On: 08/04/2017 12:53    Procedures Procedures (including critical care time)  Medications Ordered in ED Medications  sodium chloride 0.9 % bolus 1,000 mL (not administered)  albuterol (PROVENTIL HFA;VENTOLIN HFA) 108 (90 Base) MCG/ACT inhaler 1-2 puff (not administered)  AEROCHAMBER PLUS FLO-VU MEDIUM MISC 1 each (not administered)    ipratropium-albuterol (DUONEB) 0.5-2.5 (3) MG/3ML nebulizer solution 3 mL (3 mLs Nebulization Given 08/04/17 1414)  methylPREDNISolone sodium succinate (SOLU-MEDROL) 125 mg/2 mL injection 125 mg (125 mg Intravenous Given 08/04/17 1325)  iopamidol (ISOVUE-370) 76 % injection 100 mL (100 mLs Intravenous Contrast Given 08/04/17 1459)     Initial Impression / Assessment and Plan / ED Course  I have reviewed the  triage vital signs and the nursing notes.  Pertinent labs & imaging results that were available during my care of the patient were reviewed by me and considered in my medical decision making (see chart for details).   Pt is feeling much better.  Her sodium is low, but it is much better than it's been.  The pt is orthostatic which is probably why she is dizzy.  She does not have an inhaler, so she is given one.  She knows to return if worse.  F/ u with Dr. Talbert Cage.   Final Clinical Impressions(s) / ED Diagnoses   Final diagnoses:  Atypical chest pain  COPD exacerbation (HCC)  Small cell lung cancer in adult [facility])  Tobacco abuse  Mediastinal adenopathy  Orthostatic hypotension    New Prescriptions New Prescriptions   PREDNISONE (STERAPRED UNI-PAK 21 TAB) 10 MG (21) TBPK TABLET    Take 6 tabs by mouth daily  for 2 days, then 5 tabs for 2 days, then 4 tabs for 2 days, then 3 tabs for 2 days, 2 tabs for 2 days, then 1 tab by mouth daily for 2 days     Isla Pence, MD 08/04/17 1552

## 2017-08-11 ENCOUNTER — Other Ambulatory Visit (HOSPITAL_COMMUNITY): Payer: Self-pay | Admitting: Emergency Medicine

## 2017-08-11 MED ORDER — BUTALBITAL-APAP-CAFFEINE 50-325-40 MG PO TABS: 1.0000 | ORAL_TABLET | Freq: Four times a day (QID) | ORAL | 0 refills | Status: DC | PRN

## 2017-08-11 NOTE — Progress Notes (Signed)
Pt called and stated that she was having headaches 2-3 times on right side since Saturday.  Tylenol has not helped.  The headaches are so bad she has to lay down.  She does have an MRI scheduled on 08/19/2017 of her head.  Spoke with Dr Talbert Cage and called in [facility] for her headaches to [facility] in [place] and pt would like to leave her MRI as scheduled.

## 2017-08-19 ENCOUNTER — Encounter (HOSPITAL_COMMUNITY): Payer: Self-pay

## 2017-08-19 ENCOUNTER — Ambulatory Visit (HOSPITAL_COMMUNITY)
Admission: RE | Admit: 2017-08-19 | Discharge: 2017-08-19 | Disposition: A | Discharge status: Home / Self Care | Payer: Managed Care, Other (non HMO) | Source: Ambulatory Visit | Attending: Oncology | Admitting: Oncology

## 2017-08-19 ENCOUNTER — Encounter: Payer: Self-pay | Admitting: Radiation Oncology

## 2017-08-19 ENCOUNTER — Other Ambulatory Visit: Payer: Self-pay | Admitting: Radiation Therapy

## 2017-08-19 DIAGNOSIS — I2510 Atherosclerotic heart disease of native coronary artery without angina pectoris: Secondary | ICD-10-CM | POA: Insufficient documentation

## 2017-08-19 DIAGNOSIS — K5730 Diverticulosis of large intestine without perforation or abscess without bleeding: Secondary | ICD-10-CM | POA: Insufficient documentation

## 2017-08-19 DIAGNOSIS — I714 Abdominal aortic aneurysm, without rupture: Secondary | ICD-10-CM | POA: Insufficient documentation

## 2017-08-19 DIAGNOSIS — C801 Malignant (primary) neoplasm, unspecified: Secondary | ICD-10-CM | POA: Diagnosis not present

## 2017-08-19 DIAGNOSIS — C7931 Secondary malignant neoplasm of brain: Secondary | ICD-10-CM | POA: Insufficient documentation

## 2017-08-19 DIAGNOSIS — I313 Pericardial effusion (noninflammatory): Secondary | ICD-10-CM | POA: Insufficient documentation

## 2017-08-19 DIAGNOSIS — I700 Atherosclerosis of aorta: Secondary | ICD-10-CM | POA: Insufficient documentation

## 2017-08-19 DIAGNOSIS — J90 Pleural effusion, not elsewhere classified: Secondary | ICD-10-CM | POA: Insufficient documentation

## 2017-08-19 DIAGNOSIS — R918 Other nonspecific abnormal finding of lung field: Secondary | ICD-10-CM | POA: Diagnosis not present

## 2017-08-19 MED ORDER — GADOBENATE DIMEGLUMINE 529 MG/ML IV SOLN
15.0000 mL | Freq: Once | INTRAVENOUS | Status: AC | PRN
  Administered 2017-08-19: 12 mL via INTRAVENOUS

## 2017-08-19 MED ORDER — IOPAMIDOL (ISOVUE-300) INJECTION 61%
100.0000 mL | Freq: Once | INTRAVENOUS | Status: AC | PRN
  Administered 2017-08-19: 100 mL via INTRAVENOUS

## 2017-08-19 NOTE — Progress Notes (Signed)
Lydia Moore, I called the patient with the MRI brain scan results. She has new brain mets. Can we make a stat appointment for her to see radiation oncology in [place]? TY

## 2017-08-22 ENCOUNTER — Encounter (HOSPITAL_COMMUNITY): Payer: Managed Care, Other (non HMO) | Attending: Oncology | Admitting: Oncology

## 2017-08-22 ENCOUNTER — Encounter (HOSPITAL_COMMUNITY): Payer: Self-pay | Admitting: Oncology

## 2017-08-22 VITALS — BP 123/82 | HR 80 | Resp 16 | Ht 65.0 in | Wt 130.5 lb

## 2017-08-22 DIAGNOSIS — G629 Polyneuropathy, unspecified: Secondary | ICD-10-CM

## 2017-08-22 DIAGNOSIS — R871 Abnormal level of hormones in specimens from female genital organs: Secondary | ICD-10-CM

## 2017-08-22 DIAGNOSIS — Z720 Tobacco use: Secondary | ICD-10-CM | POA: Diagnosis not present

## 2017-08-22 DIAGNOSIS — E222 Syndrome of inappropriate secretion of antidiuretic hormone: Secondary | ICD-10-CM

## 2017-08-22 DIAGNOSIS — C7931 Secondary malignant neoplasm of brain: Secondary | ICD-10-CM | POA: Diagnosis not present

## 2017-08-22 DIAGNOSIS — C3431 Malignant neoplasm of lower lobe, right bronchus or lung: Secondary | ICD-10-CM

## 2017-08-22 DIAGNOSIS — R0602 Shortness of breath: Secondary | ICD-10-CM

## 2017-08-22 DIAGNOSIS — G4700 Insomnia, unspecified: Secondary | ICD-10-CM

## 2017-08-22 DIAGNOSIS — C801 Malignant (primary) neoplasm, unspecified: Secondary | ICD-10-CM

## 2017-08-22 MED ORDER — ZOLPIDEM TARTRATE 10 MG PO TABS: 10.0000 mg | ORAL_TABLET | Freq: Every evening | ORAL | 3 refills | Status: DC | PRN

## 2017-08-22 NOTE — Progress Notes (Signed)
Practice, [facility] [place] [address]  Small cell carcinoma [facility])  Insomnia, unspecified type - Plan: zolpidem (AMBIEN) 10 MG tablet  CURRENT THERAPY: Salvage Topotecan therapy beginning on 02/28/2017.  INTERVAL HISTORY: Lydia Moore 56 y.o. female returns for followup of small cell lung cancer right lung, limited stage, at time of diagnosis, presenting with SIADH.  S/P 6 cycles of cisplatin/etoposide (07/27/2016-11/11/2016) curative intent.  She was evaluated by radiation oncology for involved field XRT during chemotherapy, but due to tumor burden, she was deemed not an XRT candidate.  She refused whole brain XRT in the prophylactic setting following systemic chemotherapy.  Short interval relapse of disease less than 6 months on restaging CT scans on 02/21/2017 leading to salvage treatment with Topotecan for recurrent rash extensive stage small cell lung cancer.  S/P second opinion at [facility] with Dr. Lissa Morales on 03/10/2017.    Small cell carcinoma ([place])   07/12/2016 Imaging    CTA chest 3.9 x 3.5 cm right lower lobe mass with imaging features most compatible with a primary lung carcinoma.Marked metastatic right hilar and mediastinal adenopathy including a confluent mass of adenopathy in the right hilum, encasing and causing marked narrowing of the central pulmonary arteries on the right with a short segment of occlusion of the right lower lobe bronchus.Mild postobstructive changes in the inferior, medial right lower lobe.Mild changes of COPD       07/16/2016 Procedure    Endoscopic bronchoscopy with transbronchial biopsy of #7 nodes and biopsy of RLL lung mass      07/16/2016 Pathology Results    Lung, biopsy, Right Lower Lobe - SMALL CELL CARCINOMA.       07/21/2016 Imaging    MRI brain Negative MRI of the brain. No evidence for metastatic disease to the brain or meninges.      07/26/2016 PET scan    Markedly hypermetabolic right lower lobe lesion  with hypermetabolic metastatic disease in the right hilum and mediastinum. 2. Several scattered hypermetabolic ground-glass opacities in the right upper lobe. FDG accumulation within these nodules is higher than typically seen for infectious/inflammatory etiology although this remains within the differential. Atypical appearance of metastatic spread would also be a consideration. 3. Coronary artery atherosclerosis. 4. **An incidental finding of potential clinical significance has been found. 3.8 cm abdominal aortic aneurysm       07/27/2016 - 11/11/2016 Chemotherapy    Cisplatin/Etoposide x 6 cycles      11/09/2016 Treatment Plan Change    Patient declined/refused whole brain radiation.      11/23/2016 Imaging    CT chest with No new or progressive findings in the chest. 2. Further interval decrease in size of mediastinal lymphadenopathy. 3. Only trace scarring visible in the right lower lobe, at the location of the previous right lower lobe mass. 4. Coronary artery and thoracoabdominal aortic atherosclerosis      02/21/2017 Imaging    CT CAP- 1. Unfortunately the patient has had significant recurrence with marked mediastinal and right infrahilar adenopathy as well as suspected masses in the collapsed right lower lobe. The confluent adenopathy completely occlude the right lower lobe and right middle lobe bronchi, although there is some air trapping in the right middle lobe ; the right lower lobe is completely collapsed around several right lower lobe masses. 2. There is some speckled density in the left anterior sixth rib which is not entirely specific but which is concerning for early osseous metastatic  disease given that it was not present on 11/23/2016. Consider a bone scan to further assess the skeleton for other occult bony spread. 3. Infrarenal abdominal aortic aneurysm 4.2 cm in diameter, increased from the prior measurement of 3.8 cm on 07/26/2016. Although this is only  minor increase, it has occurred in a 7 month period. The patient has a previous existing relationship of cardiothoracic surgery and surveillance of this aneurysm by surgery is appropriate. 4. Other imaging findings of potential clinical significance: Coronary, aortic arch, and branch vessel atherosclerotic vascular disease. Mild lumbar spondylosis and degenerative disc disease.      02/22/2017 Relapse/Recurrence    Last Treatment Date: 11/11/2016 Recent Lab Values: Recent Lab Values: N/A      02/28/2017 -  Chemotherapy    Topotecan       03/08/2017 Imaging    MRI brain- 1. No evidence of metastatic disease. 2. Intermittently motion degraded.      03/08/2017 Imaging    Bone scan: IMPRESSION: No evidence skeletal metastasis by bone scintigraphy.      03/10/2017 Miscellaneous    Consult at [facility] with Dr. Aniceto Boss:  Following Topotecan therapy, she may be a candidate for immunotherapy at time of progression/relapse, with Nivolumab versus Nivolumab/Ipilumumab.  Response rates with single-agent immunotherapy is ~ 10% and with combo therapy 20-30%.  Responses are durable.  Rate of side effects from therapy are ~ 5% and 20-30%.  This therapy is not yet FDA approved and therefore if this therapy option is approved, prior approval from insurance company will be needed.  Dr. Aniceto Boss favors the combination therapy.  Additionally, she may be eligible for a clinical trial (ie Rova-T + nivolumab/ipilumumab).      04/07/2017 Imaging    CT CAP- 1. Persistent but improved bulky mediastinal and right hilar/infrahilar lymphadenopathy and significant decrease in size of the right lower lobe mass and obstructive pneumonitis. Residual branching endobronchial tumor in the right lower lobe. 2. No findings for pulmonary metastatic nodules or abdominal/pelvic lymphadenopathy. 3. Stable infrarenal abdominal aortic aneurysm.      04/12/2017 Treatment Plan Change    Tx deferred x 1 week due to  hyponatremia requiring work-up.      08/19/2017 Imaging    MRI Brain w/ and w/o contrast: IMPRESSION: New 4 x 8 mm metastasis at the medial left posterior frontal lobe. Three other questionable small metastases, 1 at the posterolateral inferior left parietal lobe, 1 in the right posterior parietal white matter, and 1 in the left frontal region. These cannot be confirmed on multiple sequences. Therefore, it may be worthwhile obtaining a 3 tesla examination, particularly if stereotactic radiotherapy is considered.      08/19/2017 Imaging    CT C/A/P: IMPRESSION: 1. Right lower lobe lung mass is increased since 08/04/2017 and significantly increased since 04/07/2017, with new occlusion of the right lower lobe bronchus and complete right lower lobe atelectasis. 2. Bulky ipsilateral hilar and right paratracheal and subcarinal mediastinal adenopathy, stable since 08/04/2017 and significantly increased since 04/07/2017. 3. New hypodense pancreatic tail mass, most compatible with pancreatic metastasis . 4. No additional sites of metastatic disease in the chest, abdomen or pelvis. 5. New trace dependent right pleural effusion. Increased interlobular septal thickening in the right middle lobe, favor localized pulmonary edema, cannot exclude lymphangitic tumor spread. 6. Small pericardial effusion, stable since 08/04/2017, new since 04/07/2017 . 7. Three-vessel coronary atherosclerosis. 8. Aortic Atherosclerosis (ICD10-I70.0). Mildly increased 4.4 cm Abdominal Aortic Aneurysm (ICD10-I71.9). Recommend follow-up aortic ultrasound in 1 year. This recommendation  follows ACR consensus guidelines: White Paper of the ACR Incidental Findings Committee II on Vascular Findings. J Am Coll Radiol 2013; 10:789-794. 9. Mild colonic diverticulosis.       08/19/2017 Progression    New brain mets, enlarging right lower lobe mass, bulky chest lymphadenopathy, new pancreatic tail lesion        Patient presents for follow-up todayWith her daughter and to review her restaging scans. I have call patient on 08/19/2017 1 at her MRI results back demonstrating that she has new brain metastases. She has an appointment to see radiation oncology on 08/23/2017. She states she has not developed any new headaches or neurological symptoms. When she does get a headache, she takes Fioricet and it controls her symptoms. She has chronic shortness of breath which is stable and has not worsened. 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 Otherwise she has no complaints today.  Review of Systems  Constitutional: Negative.  Negative for chills, fever and weight loss.  HENT: Negative.  Negative for hearing loss, sore throat and tinnitus.   Eyes: Negative.  Negative for blurred vision, photophobia and discharge.  Respiratory: Positive for shortness of breath (chronic, stable). Negative for cough, hemoptysis and wheezing.   Cardiovascular: Negative.  Negative for chest pain, palpitations, orthopnea, claudication and leg swelling.  Gastrointestinal: Negative.  Negative for abdominal pain, blood in stool, constipation, diarrhea, melena, nausea and vomiting.  Genitourinary: Negative.  Negative for dysuria and hematuria.  Musculoskeletal: Negative.  Negative for back pain, joint pain and myalgias.  Skin: Negative.  Negative for itching and rash.  Neurological: Positive for tingling (neuropathy in hands and feet-chronic). Negative for dizziness, tremors, sensory change, speech change, focal weakness, seizures, loss of consciousness and weakness.  Endo/Heme/Allergies: Negative.  Negative for environmental allergies and polydipsia. Does not bruise/bleed easily.  Psychiatric/Behavioral: Negative.  Negative for depression. The patient is not nervous/anxious and does not have insomnia.     Past Medical History:  Diagnosis Date  . Elevated cholesterol   . GERD (gastroesophageal reflux  disease)   . GI bleed   . Goals of care, counseling/discussion 03/21/2017  . Hypertension   . 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 Small cell carcinoma ([place]) 07/21/2016    Past Surgical History:  Procedure Laterality Date  . ABDOMINAL HYSTERECTOMY     partial  . CESAREAN SECTION    . COLONOSCOPY    . PORTACATH PLACEMENT Left 08/04/2016   Procedure: INSERTION PORT-A-CATH LEFT SUBCLAVIAN;  Surgeon: Aviva Signs, MD;  Location: [facility];  Service: General;  Laterality: Left;  . TONSILLECTOMY    . VIDEO BRONCHOSCOPY WITH ENDOBRONCHIAL ULTRASOUND N/A 07/16/2016   Procedure: VIDEO BRONCHOSCOPY WITH ENDOBRONCHIAL ULTRASOUND with ultrasound transbronchial biopsy of node #7 and right lower lobe lesion;  Surgeon: Grace Isaac, MD;  Location[facility];  Service: Thoracic;  Laterality: N/A;    Family History  Problem Relation Age of Onset  . Heart disease Mother   . Heart disease Father   . COPD Sister   . HIV Brother     Social History   Social History  . Marital status: Married    Spouse name: N/A  . Number of children: 4  . Years of education: N/A   Occupational History  . [employer]   Social History Main Topics  . Smoking status: Current Every Day Smoker    Packs/day: 0.50    Years: 35.00    Types: Cigarettes  . Smokeless  tobacco: Never Used  . Alcohol use Yes     Comment: occasionally, Couple drinks per mo  . Drug use: Yes    Frequency: 1.0 time per week    Types: Marijuana     Comment: weekly  . Sexual activity: Yes    Birth control/ protection: Surgical     Comment: married   Other Topics Concern  . None   Social History Narrative   Lives w/ husband     PHYSICAL EXAMINATION  ECOG PERFORMANCE STATUS: 0 - Asymptomatic  Vitals:   08/22/17 1030  BP: 123/82  Pulse: 80  Resp: 16  SpO2: 100%    Vitals - 1 value per visit 04/21/3709  SYSTOLIC 626  DIASTOLIC 74  Pulse 74  Temperature 97.9  Respirations 18  Weight (lb) 142.6     Physical Exam  Constitutional: She is oriented to person, place, and time and well-developed, well-nourished, and in no distress. No distress.  HENT:  Head: Normocephalic and atraumatic.  Mouth/Throat: No oropharyngeal exudate.  Eyes: Pupils are equal, round, and reactive to light. Conjunctivae are normal. No scleral icterus.  Neck: Normal range of motion. Neck supple. No JVD present.  Cardiovascular: Normal rate, regular rhythm and normal heart sounds.  Exam reveals no gallop and no friction rub.   No murmur heard. Pulmonary/Chest: Breath sounds normal. No respiratory distress. She has no wheezes. She has no rales.  Abdominal: Soft. Bowel sounds are normal. She exhibits no distension. There is no tenderness. There is no guarding.  Musculoskeletal: She exhibits no edema or tenderness.  Lymphadenopathy:    She has no cervical adenopathy.  Neurological: She is alert and oriented to person, place, and time. No cranial nerve deficit.  Skin: Skin is warm and dry. No rash noted. No erythema. No pallor.  Psychiatric: Affect and judgment normal.     LABORATORY DATA: CBC    Component Value Date/Time   WBC 15.9 (H) 08/04/2017 1208   RBC 2.79 (L) 08/04/2017 1208   HGB 8.6 (L) 08/04/2017 1208   HCT 25.6 (L) 08/04/2017 1208   PLT 57 (L) 08/04/2017 1208   MCV 91.8 08/04/2017 1208   MCH 30.8 08/04/2017 1208   MCHC 33.6 08/04/2017 1208   RDW 20.6 (H) 08/04/2017 1208   LYMPHSABS 0.7 07/19/2017 1046   MONOABS 0.5 07/19/2017 1046   EOSABS 0.1 07/19/2017 1046   BASOSABS 0.1 07/19/2017 1046      Chemistry      Component Value Date/Time   NA 132 (L) 08/04/2017 1208   K 3.0 (L) 08/04/2017 1208   CL 94 (L) 08/04/2017 1208   CO2 27 08/04/2017 1208   BUN 16 08/04/2017 1208   CREATININE 0.94 08/04/2017 1208      Component Value Date/Time   CALCIUM 9.0 08/04/2017 1208   ALKPHOS 80 07/19/2017 1046   AST 28 07/19/2017 1046   ALT 27 07/19/2017 1046   BILITOT 0.4 07/19/2017 1046         PENDING LABS:   RADIOGRAPHIC STUDIES:  Ct Chest W Contrast  Result Date: 08/19/2017 CLINICAL DATA:  Re- stage right lower lobe small cell lung cancer, limited stage at time of diagnosis in August 2017 with ongoing chemotherapy. EXAM: CT CHEST, ABDOMEN, AND PELVIS WITH CONTRAST TECHNIQUE: Multidetector CT imaging of the chest, abdomen and pelvis was performed following the standard protocol during bolus administration of intravenous contrast. CONTRAST:  150mL ISOVUE-300 IOPAMIDOL (ISOVUE-300) INJECTION 61% COMPARISON:  04/07/2017 CT chest, abdomen and pelvis. 08/04/2017 chest CT angiogram. FINDINGS:  CT CHEST FINDINGS Cardiovascular: Normal heart size. Small pericardial effusion/thickening, stable since 08/04/2017, new since 04/07/2017. Left anterior descending, left circumflex and right coronary atherosclerosis. Atherosclerotic nonaneurysmal thoracic aorta. Normal caliber pulmonary arteries. No central pulmonary emboli. Left subclavian MediPort terminates in the middle third of the superior vena cava. Mediastinum/Nodes: No discrete thyroid nodules. Unremarkable esophagus. No axillary adenopathy. Bulky right paratracheal adenopathy measuring up to 4.5 cm (series 2/ image 18), stable from 4.5 cm on 08/04/2017 and significantly increased from 2.5 cm on 04/07/2017. Bulky 4.9 cm subcarinal node (series 2/image 28), stable from 4.9 cm on 08/04/2017 and significantly increased from 2.8 cm on 04/07/2017. Bulky right infrahilar adenopathy measuring up to 4.5 cm (series 2/ image 33), stable from 4.5 cm on 08/04/2017 and significantly increased from 2.5 cm on 04/07/2017. No left hilar adenopathy. Lungs/Pleura: No pneumothorax. Trace dependent right pleural effusion, new. No left pleural effusion. New complete right lower lobe atelectasis with occluded right lower lobe bronchus. Irregular right lower lobe 5.0 x 3.5 cm lung mass (series 2/ image 35), increased from 4.8 x 2.9 cm on 08/04/2017 and 3.9 x 2.2 cm on  04/07/2017. Interlobular septal thickening throughout the right middle lobe is increased. No additional significant pulmonary nodules. Musculoskeletal: No aggressive appearing focal osseous lesions. Mild thoracic spondylosis. CT ABDOMEN PELVIS FINDINGS Hepatobiliary: Normal liver with no liver mass. Normal gallbladder with no radiopaque cholelithiasis. No biliary ductal dilatation. Stable small periampullary duodenal diverticulum. Pancreas: New hypodense 2.2 cm pancreatic tail mass (series 2/ image 56). No pancreatic duct dilation. Spleen: Normal size. No definite mass. Adrenals/Urinary Tract: No discrete adrenal nodules. Normal kidneys with no hydronephrosis and no renal mass. Normal bladder. Stomach/Bowel: Grossly normal stomach. Normal caliber small bowel with no small bowel wall thickening. Normal appendix. Scattered mild colonic diverticulosis, with no large bowel wall thickening or pericolonic fat stranding. Vascular/Lymphatic: Atherosclerotic abdominal aorta with 4.4 cm infrarenal abdominal aortic aneurysm, previously 4.3 cm, minimally increased. Patent portal, splenic, hepatic and renal veins. No pathologically enlarged lymph nodes in the abdomen or pelvis. Reproductive: Status post hysterectomy, with no abnormal findings at the vaginal cuff. No adnexal mass. Other: No pneumoperitoneum, ascites or focal fluid collection. Musculoskeletal: No aggressive appearing focal osseous lesions. Moderate lumbar spondylosis. IMPRESSION: 1. Right lower lobe lung mass is increased since 08/04/2017 and significantly increased since 04/07/2017, with new occlusion of the right lower lobe bronchus and complete right lower lobe atelectasis. 2. Bulky ipsilateral hilar and right paratracheal and subcarinal mediastinal adenopathy, stable since 08/04/2017 and significantly increased since 04/07/2017. 3. New hypodense pancreatic tail mass, most compatible with pancreatic metastasis . 4. No additional sites of metastatic disease in  the chest, abdomen or pelvis. 5. New trace dependent right pleural effusion. Increased interlobular septal thickening in the right middle lobe, favor localized pulmonary edema, cannot exclude lymphangitic tumor spread. 6. Small pericardial effusion, stable since 08/04/2017, new since 04/07/2017 . 7. Three-vessel coronary atherosclerosis. 8. Aortic Atherosclerosis (ICD10-I70.0). Mildly increased 4.4 cm Abdominal Aortic Aneurysm (ICD10-I71.9). Recommend follow-up aortic ultrasound in 1 year. This recommendation follows ACR consensus guidelines: White Paper of the ACR Incidental Findings Committee II on Vascular Findings. J Am Coll Radiol 2013; 10:789-794. 9. Mild colonic diverticulosis. Electronically Signed   By: Ilona Sorrel M.D.   On: 08/19/2017 14:34   Ct Angio Chest Pe W Or Wo Contrast  Result Date: 08/04/2017 CLINICAL DATA:  Chest pain, shortness of breath. Current history of small cell lung cancer. EXAM: CT ANGIOGRAPHY CHEST WITH CONTRAST TECHNIQUE: Multidetector CT imaging of the chest  was performed using the standard protocol during bolus administration of intravenous contrast. Multiplanar CT image reconstructions and MIPs were obtained to evaluate the vascular anatomy. CONTRAST:  100 mL of Isovue 370 intravenously. COMPARISON:  CT scan of Apr 07, 2017. FINDINGS: Cardiovascular: Satisfactory opacification of the pulmonary arteries to the segmental level. No evidence of pulmonary embolism. Normal heart size. No pericardial effusion. Atherosclerosis of thoracic aorta is noted without aneurysm or dissection. Coronary artery calcifications are noted. Mediastinum/Nodes: Mediastinal adenopathy noted on prior exam is significantly enlarged currently. Subcarinal adenopathy measures 6.5 x 5.2 cm and appears to result in compression of the right lower lobe bronchus. 7.5 x 4.8 cm right hilar adenopathy is noted. 5.8 x 4.6 cm right peritracheal adenopathy is noted. The esophagus is unremarkable. Thyroid gland appears  normal. Lungs/Pleura: No pneumothorax or pleural effusion is noted. Left lung is clear. Mild right basilar subsegmental atelectasis is noted. Upper Abdomen: No acute abnormality. Musculoskeletal: No chest wall abnormality. No acute or significant osseous findings. Review of the MIP images confirms the above findings. IMPRESSION: No definite evidence of pulmonary embolus. Significantly enlarged mediastinal adenopathy is noted as described above, consistent with worsening metastatic disease. Aortic Atherosclerosis (ICD10-I70.0). Electronically Signed   By: Marijo Conception, M.D.   On: 08/04/2017 15:35   Mr Jeri Cos PJ Contrast  Result Date: 08/19/2017 CLINICAL DATA:  History of lung cancer.  Restaging. EXAM: MRI HEAD WITHOUT AND WITH CONTRAST TECHNIQUE: Multiplanar, multiecho pulse sequences of the brain and surrounding structures were obtained without and with intravenous contrast. CONTRAST:  47mL MULTIHANCE GADOBENATE DIMEGLUMINE 529 MG/ML IV SOLN COMPARISON:  03/08/2017.  07/21/2016. FINDINGS: Brain: Background pattern shows minimal small vessel change of the cerebral hemispheric white matter. There is a newly seen 4 x 8 mm metastasis at the medial right posterior frontal vertex. No edema, hemorrhage or mass effect. There are 3 other inconclusive broke questionable small metastases. In the left frontal lobe, there is a punctate focus of restricted diffusion best seen on coronal imaging which could be a tiny nonenhancing metastasis. There is a 3 mm focus of restricted diffusion in the right posterior parietal white matter with questionable smudgy enhancement that could be a small metastasis. There is a questionable 3 mm focus of enhancement at the left posterior inferior parietal lobe that could be a small metastasis. Based on these inconclusive findings, a would probably be worthwhile obtaining a 3 tesla study, particularly if stereotactic treatment is considered. Vascular: Major vessels at the base of the brain  show flow. Skull and upper cervical spine: Negative Sinuses/Orbits: Clear/normal Other: None IMPRESSION: New 4 x 8 mm metastasis at the medial left posterior frontal lobe. Three other questionable small metastases, 1 at the posterolateral inferior left parietal lobe, 1 in the right posterior parietal white matter, and 1 in the left frontal region. These cannot be confirmed on multiple sequences. Therefore, it may be worthwhile obtaining a 3 tesla examination, particularly if stereotactic radiotherapy is considered. Electronically Signed   By: Nelson Chimes M.D.   On: 08/19/2017 10:49   Ct Abdomen Pelvis W Contrast  Result Date: 08/19/2017 CLINICAL DATA:  Re- stage right lower lobe small cell lung cancer, limited stage at time of diagnosis in August 2017 with ongoing chemotherapy. EXAM: CT CHEST, ABDOMEN, AND PELVIS WITH CONTRAST TECHNIQUE: Multidetector CT imaging of the chest, abdomen and pelvis was performed following the standard protocol during bolus administration of intravenous contrast. CONTRAST:  153mL ISOVUE-300 IOPAMIDOL (ISOVUE-300) INJECTION 61% COMPARISON:  04/07/2017 CT chest, abdomen  and pelvis. 08/04/2017 chest CT angiogram. FINDINGS: CT CHEST FINDINGS Cardiovascular: Normal heart size. Small pericardial effusion/thickening, stable since 08/04/2017, new since 04/07/2017. Left anterior descending, left circumflex and right coronary atherosclerosis. Atherosclerotic nonaneurysmal thoracic aorta. Normal caliber pulmonary arteries. No central pulmonary emboli. Left subclavian MediPort terminates in the middle third of the superior vena cava. Mediastinum/Nodes: No discrete thyroid nodules. Unremarkable esophagus. No axillary adenopathy. Bulky right paratracheal adenopathy measuring up to 4.5 cm (series 2/ image 18), stable from 4.5 cm on 08/04/2017 and significantly increased from 2.5 cm on 04/07/2017. Bulky 4.9 cm subcarinal node (series 2/image 28), stable from 4.9 cm on 08/04/2017 and significantly  increased from 2.8 cm on 04/07/2017. Bulky right infrahilar adenopathy measuring up to 4.5 cm (series 2/ image 33), stable from 4.5 cm on 08/04/2017 and significantly increased from 2.5 cm on 04/07/2017. No left hilar adenopathy. Lungs/Pleura: No pneumothorax. Trace dependent right pleural effusion, new. No left pleural effusion. New complete right lower lobe atelectasis with occluded right lower lobe bronchus. Irregular right lower lobe 5.0 x 3.5 cm lung mass (series 2/ image 35), increased from 4.8 x 2.9 cm on 08/04/2017 and 3.9 x 2.2 cm on 04/07/2017. Interlobular septal thickening throughout the right middle lobe is increased. No additional significant pulmonary nodules. Musculoskeletal: No aggressive appearing focal osseous lesions. Mild thoracic spondylosis. CT ABDOMEN PELVIS FINDINGS Hepatobiliary: Normal liver with no liver mass. Normal gallbladder with no radiopaque cholelithiasis. No biliary ductal dilatation. Stable small periampullary duodenal diverticulum. Pancreas: New hypodense 2.2 cm pancreatic tail mass (series 2/ image 56). No pancreatic duct dilation. Spleen: Normal size. No definite mass. Adrenals/Urinary Tract: No discrete adrenal nodules. Normal kidneys with no hydronephrosis and no renal mass. Normal bladder. Stomach/Bowel: Grossly normal stomach. Normal caliber small bowel with no small bowel wall thickening. Normal appendix. Scattered mild colonic diverticulosis, with no large bowel wall thickening or pericolonic fat stranding. Vascular/Lymphatic: Atherosclerotic abdominal aorta with 4.4 cm infrarenal abdominal aortic aneurysm, previously 4.3 cm, minimally increased. Patent portal, splenic, hepatic and renal veins. No pathologically enlarged lymph nodes in the abdomen or pelvis. Reproductive: Status post hysterectomy, with no abnormal findings at the vaginal cuff. No adnexal mass. Other: No pneumoperitoneum, ascites or focal fluid collection. Musculoskeletal: No aggressive appearing focal  osseous lesions. Moderate lumbar spondylosis. IMPRESSION: 1. Right lower lobe lung mass is increased since 08/04/2017 and significantly increased since 04/07/2017, with new occlusion of the right lower lobe bronchus and complete right lower lobe atelectasis. 2. Bulky ipsilateral hilar and right paratracheal and subcarinal mediastinal adenopathy, stable since 08/04/2017 and significantly increased since 04/07/2017. 3. New hypodense pancreatic tail mass, most compatible with pancreatic metastasis . 4. No additional sites of metastatic disease in the chest, abdomen or pelvis. 5. New trace dependent right pleural effusion. Increased interlobular septal thickening in the right middle lobe, favor localized pulmonary edema, cannot exclude lymphangitic tumor spread. 6. Small pericardial effusion, stable since 08/04/2017, new since 04/07/2017 . 7. Three-vessel coronary atherosclerosis. 8. Aortic Atherosclerosis (ICD10-I70.0). Mildly increased 4.4 cm Abdominal Aortic Aneurysm (ICD10-I71.9). Recommend follow-up aortic ultrasound in 1 year. This recommendation follows ACR consensus guidelines: White Paper of the ACR Incidental Findings Committee II on Vascular Findings. J Am Coll Radiol 2013; 10:789-794. 9. Mild colonic diverticulosis. Electronically Signed   By: Ilona Sorrel M.D.   On: 08/19/2017 14:34   Dg Chest Port 1 View  Result Date: 08/04/2017 CLINICAL DATA:  Stage IV lung cancer. Dizziness. Sweating. Difficulty breathing and coughing. EXAM: PORTABLE CHEST 1 VIEW COMPARISON:  CT 04/07/2017. FINDINGS: PowerPort  catheter noted with tip projected superior vena cava. Mild cardiomegaly. No pulmonary venous congestion. Mediastinal and and hilar fullness consistent with adenopathy again noted . Right infrahilar mass may still be present. Minimal atelectasis scratched it mild atelectasis right lung base. Tiny right pleural effusion cannot be excluded. No acute bony abnormality identified. IMPRESSION: 1. PowerPort catheter  with tip over the superior vena cava. 2. Prominent mediastinal and right hilar fullness consistent with severe adenopathy again noted . Right infrahilar mass lesion may remain. Mild right base atelectasis. Small right pleural effusion . No acute abnormality identified . Electronically Signed   By: Marcello Moores  Register   On: 08/04/2017 12:53     PATHOLOGY:    ASSESSMENT:  1. 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  S/P 6 cycles of Cisplatin/Etoposide (07/27/2016- 11/11/2016) with curative intent.  She was evaluated by radiation oncology for involved field XRT during chemotherapy, but due to tumor burden, she was deemed not an XRT candidate.  She refused whole brain XRT in the prophylactic setting following systemic chemotherapy.  Short interval relapse of disease <6 months on restaging CT imaging on 02/21/2017 leading to salvage treatment with Topotecan for recurrent/extensive stage SCLC.  S/P second opinion at [place] with Dr. Lissa Morales on 03/10/2017.  -Completed cycle 6 of topotecan on 07/25/17.  -Reviewed her MRI brain and Ct C/A/P in detail with the patient today. She has evidence of progressive disease with new small brain mets, enlarging right lower lobe mass causing complete obstruction of her right lower lobe bronchus and complete atelecstasis, bulky chest lymphadenopathy, new pancreatic tail lesion. Copies of her scans were given to the patient. -She has an appointment with radiation oncology tomorrow for evaluation/treatment of her new brain mets.  -As per her previous [facility] recommendations, I have discussed starting treatment with nivolumab/ipilimumab q21 days once she completes brain RT. I have reviewed side effects of nivolumab/ipilimumab in detail with her and her daughter today and provided them with take home reading material.  [facility] also has a trial of nivolumab/ipilimumab and Rova-T and she may be a candidate for that trial at time of disease  progression. She has an appointment to see Dr. Aniceto Boss at [facility] this Friday 08/26/17. Patient and her daughter stated that they would like to wait to see what her [facility] oncologist recommends.  2. 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 Treatment of this is to treat the underlying disease. - Patient is asymptomatic. - Stable. Last sodium on 08/04/17 was 132. - Advised patient to continue her sodium tablets PO TID. - I have advised patient to fluid restrict to less than a liter of fluids daily. - Encouraged patient to increase salt intake. - Potentially can add a loop diuretic with lasix 20mg  PO BID if worsening sodium level on fluid restriction and sodium tablets.  - if patient should become symptomatic in the future from hyponatremia, such as developing seizures, then she can be placed on tolvaptan 15 mg PO daily.  3.Insomnia -Refilled her [place] today.  Tentatively for now I have told the patient to RTC in 3 weeks for follow up. IF her radiation treatment is not completed yet at that time, she is to move out her visit until she has finished brain RT so that we may discuss systemic treatment.   THERAPY PLAN:  Topotecan  All questions were answered. The patient knows to call the clinic with any problems,  questions or concerns. We can certainly see the patient much sooner if necessary.   This note is electronically signed by: Twana First, MD 08/22/2017 11:32 AM

## 2017-08-22 NOTE — Progress Notes (Addendum)
Location/Histology of Brain Tumor:    MRI 08/19/2017: IMPRESSION: New 4 x 8 mm metastasis at the medial left posterior frontal lobe. Three other questionable small metastases, 1 at the posterolateral inferior left parietal lobe, 1 in the right posterior parietal white matter, and 1 in the left frontal region. These cannot be confirmed on multiple sequences. Therefore, it may be worthwhile obtaining a 3 tesla examination, particularly if stereotactic radiotherapy is considered.  Patient presented with symptoms of:  Shortness breath,dizzyness, Cp, unable to walk to the car,took to ED by EMS  Past or anticipated interventions, if any, per neurosurgery  Past or anticipated interventions, if any, per medical oncology:Dr. Maryclare Labrador THERAPY: Salvage Topotecan therapy beginning on 02/28/2017. Has appt with Dr. Desma Mcgregor Medical Oncologist at [facility] on Friday 08/26/17   Dose of Decadron, if applicable:no, was on prednisone COMPLETED,   Recent neurologic symptoms, if any:   Seizures: None  Headaches: yes last this  Right temple, took fiorcet this am resolved  Nausea: yes   Dizziness/ataxia: at times  Difficulty with hand coordination: no  Focal numbness/weakness:numbness feet to ankle, and finget tips   Visual deficits/changes: None  Confusion/Memory deficits: No  Painful bone metastases at present, if any   SAFETY ISSUES: No Wt Readings from Last 3 Encounters:  08/23/17 131 lb (59.4 kg)  08/22/17 130 lb 8 oz (59.2 kg)  08/04/17 130 lb (59 kg)   BP 136/86   Pulse 84   Temp 98.2 F (36.8 C) (Oral)   Resp 20   Ht 5' 5.5" (1.664 m)   Wt 131 lb (59.4 kg)   SpO2 99% Comment: room air  BMI 21.47 kg/m   Prior radiation? NO  Pacemaker/ICD? NO  Possible current pregnancy? NO  Is the patient on methotrexate? NO  Additional Complaints / other details: Married, 1/2ppd 31 years, dx small cell lung cancer9/1/17  Mother and Father heart disease, Sister COPD,Brother  HIV  Allergies:NKA

## 2017-08-23 ENCOUNTER — Encounter: Payer: Self-pay | Admitting: Radiation Oncology

## 2017-08-23 ENCOUNTER — Ambulatory Visit
Admission: RE | Admit: 2017-08-23 | Discharge: 2017-08-23 | Disposition: A | Discharge status: Home / Self Care | Payer: Managed Care, Other (non HMO) | Source: Ambulatory Visit | Attending: Radiation Oncology | Admitting: Radiation Oncology

## 2017-08-23 VITALS — BP 136/86 | HR 84 | Temp 98.2°F | Resp 20 | Ht 65.5 in | Wt 131.0 lb

## 2017-08-23 DIAGNOSIS — C801 Malignant (primary) neoplasm, unspecified: Secondary | ICD-10-CM

## 2017-08-23 DIAGNOSIS — C7931 Secondary malignant neoplasm of brain: Secondary | ICD-10-CM

## 2017-08-23 DIAGNOSIS — C7949 Secondary malignant neoplasm of other parts of nervous system: Principal | ICD-10-CM

## 2017-08-23 DIAGNOSIS — C3431 Malignant neoplasm of lower lobe, right bronchus or lung: Secondary | ICD-10-CM | POA: Insufficient documentation

## 2017-08-23 DIAGNOSIS — Z510 Encounter for antineoplastic radiation therapy: Secondary | ICD-10-CM | POA: Insufficient documentation

## 2017-08-23 NOTE — Addendum Note (Signed)
Encounter addended by: Doreen Beam, RN on: 08/23/2017 11:01 AM<BR>    Actions taken: Patient Education assessment filed

## 2017-08-23 NOTE — Progress Notes (Signed)
Radiation Oncology         [phone] ________________________________  Name: [place]        MRN: [id number]  Date of Service: 08/23/2017 DOB: 1961-04-10  WN:[id number], Dayspring Crosby Oyster, MD     REFERRING PHYSICIAN: Twana First, MD   DIAGNOSIS: The encounter diagnosis was Secondary malignant neoplasm of brain and spinal cord ([place]).   HISTORY OF PRESENT ILLNESS: [place] is a 56 y.o. female seen at the request of Dr. 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 Completed systemic therapy and declined prophylactic cranial irradiation in December 2017. She had another MRI of the brain in April 2018 that was also negative, she's been on single agent to the TPN for 6 therapy, and that the last interval visit about a week ago she noticed dizziness, she underwent MRI of the brain on 08/19/2017 revealing a 4 x 8 mm right frontal vertex lesion and 3 other sites within the cerebral cortex concerning for possible disease, by MRI they were describedis a punctate focus in the left frontal lobe, a 3 mm focus in the right posterior parietal lobe, and questionable 3 mm focus of enhancement at the left posterior inferior parietal lobe.by CT imaging, on 08/19/2017 her right lower lobe mass has increased with occlusion of the right lower lobe bronchus and complete right lower lobe atelectasis, and bulky ipsilateral hilar and right paratracheal and subcarinal adenopathy was noted to be stable since her last scan, a new hypodense pancreatic tail mass compatible with pancreatic disease is also seen, a new dependent right pleural effusion has also been noted.she comes today to discuss options of whole brain radiotherapy    PREVIOUS RADIATION THERAPY: No   PAST MEDICAL HISTORY:  Past Medical History:  Diagnosis Date  . Elevated  cholesterol   . GERD (gastroesophageal reflux disease)   . GI bleed   . Goals of care, counseling/discussion 03/21/2017  . Hypertension   . Peripheral neuropathy due to chemotherapy ([facility]) 02/22/2017  . Small cell carcinoma ([place]) 07/21/2016       PAST SURGICAL HISTORY: Past Surgical History:  Procedure Laterality Date  . ABDOMINAL HYSTERECTOMY     partial  . CESAREAN SECTION    . COLONOSCOPY    . PORTACATH PLACEMENT Left 08/04/2016   Procedure: INSERTION PORT-A-CATH LEFT SUBCLAVIAN;  Surgeon: Aviva Signs, MD;  Location: [facility];  Service: General;  Laterality: Left;  . TONSILLECTOMY    . VIDEO BRONCHOSCOPY WITH ENDOBRONCHIAL ULTRASOUND N/A 07/16/2016   Procedure: VIDEO BRONCHOSCOPY WITH ENDOBRONCHIAL ULTRASOUND with ultrasound transbronchial biopsy of node #7 and right lower lobe lesion;  Surgeon: Grace Isaac, MD;  Location: [facility] OR;  Service: Thoracic;  Laterality: N/A;     FAMILY HISTORY:  Family History  Problem Relation Age of Onset  . Heart disease Mother   . Heart disease Father   . COPD Sister   . HIV Brother      SOCIAL HISTORY:  reports that she has been smoking Cigarettes.  She has a 17.50 pack-year smoking history. She has never used smokeless tobacco. She reports that she drinks alcohol. She reports that she uses drugs, including Marijuana, about 1 time per week.  The patient is married and lives in [place]. She's currently on disability.  ALLERGIES: Patient has no known allergies.   MEDICATIONS:  Current Outpatient Prescriptions  Medication Sig Dispense Refill  . butalbital-acetaminophen-caffeine (FIORICET, ESGIC) 50-325-40 MG tablet Take 1 tablet by mouth every 6 (six) hours as needed for headache. 30 tablet 0  . DULoxetine (CYMBALTA) 60 MG capsule TAKE ONE CAPSULE BY MOUTH EVERY DAY 30 capsule 3  . gabapentin (NEURONTIN) 300 MG capsule Take 1 capsule (300 mg total) by mouth 4 (four) times daily. (Patient taking differently: Take 600 mg by mouth at bedtime. )  120 capsule 3  . ondansetron (ZOFRAN) 8 MG tablet TAKE 1 TABLET BY MOUTH TWICE A DAY AS NEEDED START ON 3RD DAY AFTER CISPATIN CHEMO 60 tablet 2  . zolpidem (AMBIEN) 10 MG tablet Take 1 tablet (10 mg total) by mouth at bedtime as needed for sleep. 30 tablet 3  . loratadine (CLARITIN) 10 MG tablet Take 1 tablet (10 mg total) by mouth daily. (Patient taking differently: Take 10 mg by mouth daily as needed. ) 30 tablet 2  . topotecan in sodium chloride 0.9 % 100 mL Inject into the vein once. Days 1-5 every 21 days     No current facility-administered medications for this encounter.      REVIEW OF SYSTEMS: On review of systems, the patient reports that she is doing well overall. She denies any chest pain. She has  shortness of breath on exertion, and she has a productive cough with clear to white sputum. She denies any fevers, chills, night sweats, unintended weight changes. She denies any bowel or bladder disturbances, and denies abdominal pain, nausea or vomiting. She has had some occasional headache and dizziness. No progressive symptoms have occurred. She denies any new musculoskeletal or joint aches or pains. A complete review of systems is obtained and is otherwise negative.     PHYSICAL EXAM:  Wt Readings from Last 3 Encounters:  08/23/17 131 lb (59.4 kg)  08/22/17 130 lb 8 oz (59.2 kg)  08/04/17 130 lb (59 kg)   Temp Readings from Last 3 Encounters:  08/23/17 98.2 F (36.8 C) (Oral)  08/04/17 (!) 97.5 F (36.4 C) (Oral)  07/25/17 98 F (36.7 C) (Oral)   BP Readings from Last 3 Encounters:  08/23/17 136/86  08/22/17 123/82  08/04/17 124/87   Pulse Readings from Last 3 Encounters:  08/23/17 84  08/22/17 80  08/04/17 88   Pain Assessment Pain Score: 0-No pain/10  In general this is a well appearing caucasian female in no acute distress. She is alert and oriented x4 and appropriate throughout the examination. HEENT reveals that the patient is normocephalic, atraumatic. EOMs  are intact. PERRLA. Skin is intact without any evidence of gross lesions. Cardiovascular exam reveals a regular rate and rhythm, no clicks rubs or murmurs are auscultated. Chest is clear to auscultation bilaterally. Lymphatic assessment is performed and does not reveal any adenopathy in the cervical, supraclavicular, axillary, or inguinal chains. Abdomen has active bowel sounds in all quadrants and is intact. The abdomen is soft, non tender, non distended. Lower extremities are negative for pretibial pitting edema, deep calf tenderness, cyanosis or clubbing. Neurologically she is intact grossly.   ECOG = 1  0 - Asymptomatic (Fully active, able to carry on all predisease activities without restriction)  1 - Symptomatic but completely ambulatory (Restricted in physically strenuous activity but ambulatory and able to carry out work of a light or sedentary nature. For example, light housework, office work)  2 - Symptomatic, <50% in bed during the day (Ambulatory and capable of all self care but unable to  carry out any work activities. Up and about more than 50% of waking hours)  3 - Symptomatic, >50% in bed, but not bedbound (Capable of only limited self-care, confined to bed or chair 50% or more of waking hours)  4 - Bedbound (Completely disabled. Cannot carry on any self-care. Totally confined to bed or chair)  5 - Death   Eustace Pen MM, Creech RH, Tormey DC, et al. [phone]). "Toxicity and response criteria of the [facility]". [place] Oncol. 5 (6): 649-55    LABORATORY DATA:  Lab Results  Component Value Date   WBC 15.9 (H) 08/04/2017   HGB 8.6 (L) 08/04/2017   HCT 25.6 (L) 08/04/2017   MCV 91.8 08/04/2017   PLT 57 (L) 08/04/2017   Lab Results  Component Value Date   NA 132 (L) 08/04/2017   K 3.0 (L) 08/04/2017   CL 94 (L) 08/04/2017   CO2 27 08/04/2017   Lab Results  Component Value Date   ALT 27 07/19/2017   AST 28 07/19/2017   ALKPHOS 80 07/19/2017    BILITOT 0.4 07/19/2017      RADIOGRAPHY: Ct Chest W Contrast  Result Date: 08/19/2017 CLINICAL DATA:  Re- stage right lower lobe small cell lung cancer, limited stage at time of diagnosis in August 2017 with ongoing chemotherapy. EXAM: CT CHEST, ABDOMEN, AND PELVIS WITH CONTRAST TECHNIQUE: Multidetector CT imaging of the chest, abdomen and pelvis was performed following the standard protocol during bolus administration of intravenous contrast. CONTRAST:  159mL ISOVUE-300 IOPAMIDOL (ISOVUE-300) INJECTION 61% COMPARISON:  04/07/2017 CT chest, abdomen and pelvis. 08/04/2017 chest CT angiogram. FINDINGS: CT CHEST FINDINGS Cardiovascular: Normal heart size. Small pericardial effusion/thickening, stable since 08/04/2017, new since 04/07/2017. Left anterior descending, left circumflex and right coronary atherosclerosis. Atherosclerotic nonaneurysmal thoracic aorta. Normal caliber pulmonary arteries. No central pulmonary emboli. Left subclavian MediPort terminates in the middle third of the superior vena cava. Mediastinum/Nodes: No discrete thyroid nodules. Unremarkable esophagus. No axillary adenopathy. Bulky right paratracheal adenopathy measuring up to 4.5 cm (series 2/ image 18), stable from 4.5 cm on 08/04/2017 and significantly increased from 2.5 cm on 04/07/2017. Bulky 4.9 cm subcarinal node (series 2/image 28), stable from 4.9 cm on 08/04/2017 and significantly increased from 2.8 cm on 04/07/2017. Bulky right infrahilar adenopathy measuring up to 4.5 cm (series 2/ image 33), stable from 4.5 cm on 08/04/2017 and significantly increased from 2.5 cm on 04/07/2017. No left hilar adenopathy. Lungs/Pleura: No pneumothorax. Trace dependent right pleural effusion, new. No left pleural effusion. New complete right lower lobe atelectasis with occluded right lower lobe bronchus. Irregular right lower lobe 5.0 x 3.5 cm lung mass (series 2/ image 35), increased from 4.8 x 2.9 cm on 08/04/2017 and 3.9 x 2.2 cm on  04/07/2017. Interlobular septal thickening throughout the right middle lobe is increased. No additional significant pulmonary nodules. Musculoskeletal: No aggressive appearing focal osseous lesions. Mild thoracic spondylosis. CT ABDOMEN PELVIS FINDINGS Hepatobiliary: Normal liver with no liver mass. Normal gallbladder with no radiopaque cholelithiasis. No biliary ductal dilatation. Stable small periampullary duodenal diverticulum. Pancreas: New hypodense 2.2 cm pancreatic tail mass (series 2/ image 56). No pancreatic duct dilation. Spleen: Normal size. No definite mass. Adrenals/Urinary Tract: No discrete adrenal nodules. Normal kidneys with no hydronephrosis and no renal mass. Normal bladder. Stomach/Bowel: Grossly normal stomach. Normal caliber small bowel with no small bowel wall thickening. Normal appendix. Scattered mild colonic diverticulosis, with no large bowel wall thickening or pericolonic fat stranding. Vascular/Lymphatic: Atherosclerotic abdominal aorta with 4.4  cm infrarenal abdominal aortic aneurysm, previously 4.3 cm, minimally increased. Patent portal, splenic, hepatic and renal veins. No pathologically enlarged lymph nodes in the abdomen or pelvis. Reproductive: Status post hysterectomy, with no abnormal findings at the vaginal cuff. No adnexal mass. Other: No pneumoperitoneum, ascites or focal fluid collection. Musculoskeletal: No aggressive appearing focal osseous lesions. Moderate lumbar spondylosis. IMPRESSION: 1. Right lower lobe lung mass is increased since 08/04/2017 and significantly increased since 04/07/2017, with new occlusion of the right lower lobe bronchus and complete right lower lobe atelectasis. 2. Bulky ipsilateral hilar and right paratracheal and subcarinal mediastinal adenopathy, stable since 08/04/2017 and significantly increased since 04/07/2017. 3. New hypodense pancreatic tail mass, most compatible with pancreatic metastasis . 4. No additional sites of metastatic disease in  the chest, abdomen or pelvis. 5. New trace dependent right pleural effusion. Increased interlobular septal thickening in the right middle lobe, favor localized pulmonary edema, cannot exclude lymphangitic tumor spread. 6. Small pericardial effusion, stable since 08/04/2017, new since 04/07/2017 . 7. Three-vessel coronary atherosclerosis. 8. Aortic Atherosclerosis (ICD10-I70.0). Mildly increased 4.4 cm Abdominal Aortic Aneurysm (ICD10-I71.9). Recommend follow-up aortic ultrasound in 1 year. This recommendation follows ACR consensus guidelines: White Paper of the ACR Incidental Findings Committee II on Vascular Findings. J Am Coll Radiol 2013; 10:789-794. 9. Mild colonic diverticulosis. Electronically Signed   By: Ilona Sorrel M.D.   On: 08/19/2017 14:34   Ct Angio Chest Pe W Or Wo Contrast  Result Date: 08/04/2017 CLINICAL DATA:  Chest pain, shortness of breath. Current history of small cell lung cancer. EXAM: CT ANGIOGRAPHY CHEST WITH CONTRAST TECHNIQUE: Multidetector CT imaging of the chest was performed using the standard protocol during bolus administration of intravenous contrast. Multiplanar CT image reconstructions and MIPs were obtained to evaluate the vascular anatomy. CONTRAST:  100 mL of Isovue 370 intravenously. COMPARISON:  CT scan of Apr 07, 2017. FINDINGS: Cardiovascular: Satisfactory opacification of the pulmonary arteries to the segmental level. No evidence of pulmonary embolism. Normal heart size. No pericardial effusion. Atherosclerosis of thoracic aorta is noted without aneurysm or dissection. Coronary artery calcifications are noted. Mediastinum/Nodes: Mediastinal adenopathy noted on prior exam is significantly enlarged currently. Subcarinal adenopathy measures 6.5 x 5.2 cm and appears to result in compression of the right lower lobe bronchus. 7.5 x 4.8 cm right hilar adenopathy is noted. 5.8 x 4.6 cm right peritracheal adenopathy is noted. The esophagus is unremarkable. Thyroid gland appears  normal. Lungs/Pleura: No pneumothorax or pleural effusion is noted. Left lung is clear. Mild right basilar subsegmental atelectasis is noted. Upper Abdomen: No acute abnormality. Musculoskeletal: No chest wall abnormality. No acute or significant osseous findings. Review of the MIP images confirms the above findings. IMPRESSION: No definite evidence of pulmonary embolus. Significantly enlarged mediastinal adenopathy is noted as described above, consistent with worsening metastatic disease. Aortic Atherosclerosis (ICD10-I70.0). Electronically Signed   By: Marijo Conception, M.D.   On: 08/04/2017 15:35   Mr Jeri Cos YQ Contrast  Result Date: 08/19/2017 CLINICAL DATA:  History of lung cancer.  Restaging. EXAM: MRI HEAD WITHOUT AND WITH CONTRAST TECHNIQUE: Multiplanar, multiecho pulse sequences of the brain and surrounding structures were obtained without and with intravenous contrast. CONTRAST:  29mL MULTIHANCE GADOBENATE DIMEGLUMINE 529 MG/ML IV SOLN COMPARISON:  03/08/2017.  07/21/2016. FINDINGS: Brain: Background pattern shows minimal small vessel change of the cerebral hemispheric white matter. There is a newly seen 4 x 8 mm metastasis at the medial right posterior frontal vertex. No edema, hemorrhage or mass effect. There are 3 other  inconclusive broke questionable small metastases. In the left frontal lobe, there is a punctate focus of restricted diffusion best seen on coronal imaging which could be a tiny nonenhancing metastasis. There is a 3 mm focus of restricted diffusion in the right posterior parietal white matter with questionable smudgy enhancement that could be a small metastasis. There is a questionable 3 mm focus of enhancement at the left posterior inferior parietal lobe that could be a small metastasis. Based on these inconclusive findings, a would probably be worthwhile obtaining a 3 tesla study, particularly if stereotactic treatment is considered. Vascular: Major vessels at the base of the brain  show flow. Skull and upper cervical spine: Negative Sinuses/Orbits: Clear/normal Other: None IMPRESSION: New 4 x 8 mm metastasis at the medial left posterior frontal lobe. Three other questionable small metastases, 1 at the posterolateral inferior left parietal lobe, 1 in the right posterior parietal white matter, and 1 in the left frontal region. These cannot be confirmed on multiple sequences. Therefore, it may be worthwhile obtaining a 3 tesla examination, particularly if stereotactic radiotherapy is considered. Electronically Signed   By: Nelson Chimes M.D.   On: 08/19/2017 10:49   Ct Abdomen Pelvis W Contrast  Result Date: 08/19/2017 CLINICAL DATA:  Re- stage right lower lobe small cell lung cancer, limited stage at time of diagnosis in August 2017 with ongoing chemotherapy. EXAM: CT CHEST, ABDOMEN, AND PELVIS WITH CONTRAST TECHNIQUE: Multidetector CT imaging of the chest, abdomen and pelvis was performed following the standard protocol during bolus administration of intravenous contrast. CONTRAST:  183mL ISOVUE-300 IOPAMIDOL (ISOVUE-300) INJECTION 61% COMPARISON:  04/07/2017 CT chest, abdomen and pelvis. 08/04/2017 chest CT angiogram. FINDINGS: CT CHEST FINDINGS Cardiovascular: Normal heart size. Small pericardial effusion/thickening, stable since 08/04/2017, new since 04/07/2017. Left anterior descending, left circumflex and right coronary atherosclerosis. Atherosclerotic nonaneurysmal thoracic aorta. Normal caliber pulmonary arteries. No central pulmonary emboli. Left subclavian MediPort terminates in the middle third of the superior vena cava. Mediastinum/Nodes: No discrete thyroid nodules. Unremarkable esophagus. No axillary adenopathy. Bulky right paratracheal adenopathy measuring up to 4.5 cm (series 2/ image 18), stable from 4.5 cm on 08/04/2017 and significantly increased from 2.5 cm on 04/07/2017. Bulky 4.9 cm subcarinal node (series 2/image 28), stable from 4.9 cm on 08/04/2017 and significantly  increased from 2.8 cm on 04/07/2017. Bulky right infrahilar adenopathy measuring up to 4.5 cm (series 2/ image 33), stable from 4.5 cm on 08/04/2017 and significantly increased from 2.5 cm on 04/07/2017. No left hilar adenopathy. Lungs/Pleura: No pneumothorax. Trace dependent right pleural effusion, new. No left pleural effusion. New complete right lower lobe atelectasis with occluded right lower lobe bronchus. Irregular right lower lobe 5.0 x 3.5 cm lung mass (series 2/ image 35), increased from 4.8 x 2.9 cm on 08/04/2017 and 3.9 x 2.2 cm on 04/07/2017. Interlobular septal thickening throughout the right middle lobe is increased. No additional significant pulmonary nodules. Musculoskeletal: No aggressive appearing focal osseous lesions. Mild thoracic spondylosis. CT ABDOMEN PELVIS FINDINGS Hepatobiliary: Normal liver with no liver mass. Normal gallbladder with no radiopaque cholelithiasis. No biliary ductal dilatation. Stable small periampullary duodenal diverticulum. Pancreas: New hypodense 2.2 cm pancreatic tail mass (series 2/ image 56). No pancreatic duct dilation. Spleen: Normal size. No definite mass. Adrenals/Urinary Tract: No discrete adrenal nodules. Normal kidneys with no hydronephrosis and no renal mass. Normal bladder. Stomach/Bowel: Grossly normal stomach. Normal caliber small bowel with no small bowel wall thickening. Normal appendix. Scattered mild colonic diverticulosis, with no large bowel wall thickening or pericolonic fat  stranding. Vascular/Lymphatic: Atherosclerotic abdominal aorta with 4.4 cm infrarenal abdominal aortic aneurysm, previously 4.3 cm, minimally increased. Patent portal, splenic, hepatic and renal veins. No pathologically enlarged lymph nodes in the abdomen or pelvis. Reproductive: Status post hysterectomy, with no abnormal findings at the vaginal cuff. No adnexal mass. Other: No pneumoperitoneum, ascites or focal fluid collection. Musculoskeletal: No aggressive appearing focal  osseous lesions. Moderate lumbar spondylosis. IMPRESSION: 1. Right lower lobe lung mass is increased since 08/04/2017 and significantly increased since 04/07/2017, with new occlusion of the right lower lobe bronchus and complete right lower lobe atelectasis. 2. Bulky ipsilateral hilar and right paratracheal and subcarinal mediastinal adenopathy, stable since 08/04/2017 and significantly increased since 04/07/2017. 3. New hypodense pancreatic tail mass, most compatible with pancreatic metastasis . 4. No additional sites of metastatic disease in the chest, abdomen or pelvis. 5. New trace dependent right pleural effusion. Increased interlobular septal thickening in the right middle lobe, favor localized pulmonary edema, cannot exclude lymphangitic tumor spread. 6. Small pericardial effusion, stable since 08/04/2017, new since 04/07/2017 . 7. Three-vessel coronary atherosclerosis. 8. Aortic Atherosclerosis (ICD10-I70.0). Mildly increased 4.4 cm Abdominal Aortic Aneurysm (ICD10-I71.9). Recommend follow-up aortic ultrasound in 1 year. This recommendation follows ACR consensus guidelines: White Paper of the ACR Incidental Findings Committee II on Vascular Findings. J Am Coll Radiol 2013; 10:789-794. 9. Mild colonic diverticulosis. Electronically Signed   By: Ilona Sorrel M.D.   On: 08/19/2017 14:34   Dg Chest Port 1 View  Result Date: 08/04/2017 CLINICAL DATA:  Stage IV lung cancer. Dizziness. Sweating. Difficulty breathing and coughing. EXAM: PORTABLE CHEST 1 VIEW COMPARISON:  CT 04/07/2017. FINDINGS: PowerPort catheter noted with tip projected superior vena cava. Mild cardiomegaly. No pulmonary venous congestion. Mediastinal and and hilar fullness consistent with adenopathy again noted . Right infrahilar mass may still be present. Minimal atelectasis scratched it mild atelectasis right lung base. Tiny right pleural effusion cannot be excluded. No acute bony abnormality identified. IMPRESSION: 1. PowerPort catheter  with tip over the superior vena cava. 2. Prominent mediastinal and right hilar fullness consistent with severe adenopathy again noted . Right infrahilar mass lesion may remain. Mild right base atelectasis. Small right pleural effusion . No acute abnormality identified . Electronically Signed   By: Marcello Moores  Register   On: 08/04/2017 12:53       IMPRESSION/PLAN: 1. Progressive metastatic disease stage small cell carcinoma of the right lung now with brain disease, and progressive disease in the right chest. Dr. Lisbeth Renshaw discusses the pathology findings and reviews the nature of small cell carcinoma as it relates to the brain. He discusses her last imaging and would also offer a course to the right lung and mediastium in 10 fractions given her cough and to prevent collapse and further lung compromise. We discussed the risks, benefits, short, and long term effects of radiotherapy, and the patient is interested in proceeding. Dr. Lisbeth Renshaw discusses the delivery and logistics of radiotherapy and anticipates a course of treated both the whole brain and right lung with mediastinum over about 2 weeks. She will simulate today following her appointment. Written consent is obtained and placed in the chart, a copy was provided to the patient. She will consider immunotherapy in the next three weeks and is also planning to meet at [facility] this Friday for second opinion with medical oncology. We will anticipate starting treatment within the next week.    The above documentation reflects my direct findings during this shared patient visit. Please see the separate note by Dr. Lisbeth Renshaw on  this date for the remainder of the patient's plan of care.    Carola Rhine, PAC

## 2017-08-23 NOTE — Progress Notes (Signed)
Please see the Nurse Progress Note in the MD Initial Consult Encounter for this patient. 

## 2017-08-23 NOTE — Addendum Note (Signed)
Encounter addended by: Doreen Beam, RN on: 08/23/2017 11:02 AM<BR>    Actions taken: Charge Capture section accepted

## 2017-08-26 ENCOUNTER — Other Ambulatory Visit (HOSPITAL_COMMUNITY): Payer: Self-pay | Admitting: Adult Health

## 2017-08-26 ENCOUNTER — Telehealth (HOSPITAL_COMMUNITY): Payer: Self-pay | Admitting: *Deleted

## 2017-08-26 ENCOUNTER — Telehealth (HOSPITAL_COMMUNITY): Payer: Self-pay

## 2017-08-26 DIAGNOSIS — R51 Headache: Secondary | ICD-10-CM

## 2017-08-26 DIAGNOSIS — Z510 Encounter for antineoplastic radiation therapy: Secondary | ICD-10-CM | POA: Diagnosis not present

## 2017-08-26 DIAGNOSIS — R519 Headache, unspecified: Secondary | ICD-10-CM

## 2017-08-26 DIAGNOSIS — C801 Malignant (primary) neoplasm, unspecified: Secondary | ICD-10-CM

## 2017-08-26 MED ORDER — BUTALBITAL-APAP-CAFFEINE 50-325-40 MG PO TABS: 1.0000 | ORAL_TABLET | Freq: Four times a day (QID) | ORAL | 0 refills | Status: DC | PRN

## 2017-08-26 NOTE — Telephone Encounter (Signed)
Nutrition  Called patient for nutrition follow-up. Left voice mail asking for patient to call back.    [name] B. Zenia Resides, [place], [place] Registered Dietitian [phone] (pager)

## 2017-08-26 NOTE — Telephone Encounter (Signed)
Rx printed.   gwd

## 2017-08-29 ENCOUNTER — Ambulatory Visit
Admission: RE | Admit: 2017-08-29 | Discharge: 2017-08-29 | Disposition: A | Discharge status: Home / Self Care | Payer: Managed Care, Other (non HMO) | Source: Ambulatory Visit | Attending: Radiation Oncology | Admitting: Radiation Oncology

## 2017-08-29 DIAGNOSIS — Z510 Encounter for antineoplastic radiation therapy: Secondary | ICD-10-CM | POA: Diagnosis not present

## 2017-08-30 ENCOUNTER — Ambulatory Visit
Admission: RE | Admit: 2017-08-30 | Discharge: 2017-08-30 | Disposition: A | Discharge status: Home / Self Care | Payer: Managed Care, Other (non HMO) | Source: Ambulatory Visit | Attending: Radiation Oncology | Admitting: Radiation Oncology

## 2017-08-30 DIAGNOSIS — Z510 Encounter for antineoplastic radiation therapy: Secondary | ICD-10-CM | POA: Diagnosis not present

## 2017-08-31 ENCOUNTER — Telehealth (HOSPITAL_COMMUNITY): Payer: Self-pay

## 2017-08-31 ENCOUNTER — Ambulatory Visit
Admission: RE | Admit: 2017-08-31 | Discharge: 2017-08-31 | Disposition: A | Discharge status: Home / Self Care | Payer: Managed Care, Other (non HMO) | Source: Ambulatory Visit | Attending: Radiation Oncology | Admitting: Radiation Oncology

## 2017-08-31 ENCOUNTER — Other Ambulatory Visit: Payer: Self-pay | Admitting: Radiation Oncology

## 2017-08-31 DIAGNOSIS — Z510 Encounter for antineoplastic radiation therapy: Secondary | ICD-10-CM | POA: Diagnosis not present

## 2017-08-31 DIAGNOSIS — C7949 Secondary malignant neoplasm of other parts of nervous system: Principal | ICD-10-CM

## 2017-08-31 DIAGNOSIS — C7931 Secondary malignant neoplasm of brain: Secondary | ICD-10-CM

## 2017-08-31 DIAGNOSIS — R112 Nausea with vomiting, unspecified: Secondary | ICD-10-CM

## 2017-08-31 DIAGNOSIS — C801 Malignant (primary) neoplasm, unspecified: Secondary | ICD-10-CM

## 2017-08-31 MED ORDER — LORAZEPAM 1 MG PO TABS: 1.0000 mg | ORAL_TABLET | Freq: Four times a day (QID) | ORAL | 0 refills | Status: DC | PRN

## 2017-08-31 MED ORDER — ONDANSETRON 8 MG PO TBDP: 8.0000 mg | ORAL_TABLET | Freq: Three times a day (TID) | ORAL | 3 refills | Status: DC | PRN

## 2017-08-31 MED ORDER — LORAZEPAM 0.5 MG PO TABS
0.5000 mg | ORAL_TABLET | Freq: Once | ORAL | Status: AC
  Administered 2017-08-31: 0.5 mg via ORAL
  Filled 2017-08-31: qty 1

## 2017-08-31 NOTE — Telephone Encounter (Signed)
Patients daughter called stating patient has had nausea and vomiting since Sunday. The nausea has gotten worse since yesterday after patient ate a "[employer] and fries". Patient has been taking Zofran and compazine with little relief. Reviewed with RN. Offered to call suppository in for patient, she refuses to use one. Ativan 1 mg po Q6 hrs sent to patients pharmacy. Daughter wants patient to be seen today and get fluids before her 2:45 radiation appt at [facility]. Explained to daughter that we did not have an opening today but patient can come tomorrow for fluids. Also instructed daughter to not let patient eat greasy foods, even if she felt okay. Daughter states she will see if radiation will give her fluids today. Appointment made for patient tomorrow. She is to come straight here after radiation.

## 2017-08-31 NOTE — Telephone Encounter (Signed)
Patients daughter called

## 2017-09-01 ENCOUNTER — Ambulatory Visit
Admission: RE | Admit: 2017-09-01 | Discharge: 2017-09-01 | Disposition: A | Discharge status: Home / Self Care | Payer: Managed Care, Other (non HMO) | Source: Ambulatory Visit | Attending: Radiation Oncology | Admitting: Radiation Oncology

## 2017-09-01 ENCOUNTER — Ambulatory Visit (HOSPITAL_COMMUNITY): Payer: Managed Care, Other (non HMO)

## 2017-09-01 DIAGNOSIS — Z510 Encounter for antineoplastic radiation therapy: Secondary | ICD-10-CM | POA: Diagnosis not present

## 2017-09-02 ENCOUNTER — Ambulatory Visit
Admission: RE | Admit: 2017-09-02 | Discharge: 2017-09-02 | Disposition: A | Discharge status: Home / Self Care | Payer: Managed Care, Other (non HMO) | Source: Ambulatory Visit | Attending: Radiation Oncology | Admitting: Radiation Oncology

## 2017-09-02 DIAGNOSIS — Z510 Encounter for antineoplastic radiation therapy: Secondary | ICD-10-CM | POA: Diagnosis not present

## 2017-09-05 ENCOUNTER — Ambulatory Visit
Admission: RE | Admit: 2017-09-05 | Discharge: 2017-09-05 | Disposition: A | Discharge status: Home / Self Care | Payer: Managed Care, Other (non HMO) | Source: Ambulatory Visit | Attending: Radiation Oncology | Admitting: Radiation Oncology

## 2017-09-05 ENCOUNTER — Other Ambulatory Visit: Payer: Self-pay | Admitting: Radiation Oncology

## 2017-09-05 ENCOUNTER — Telehealth: Payer: Self-pay | Admitting: Radiation Oncology

## 2017-09-05 DIAGNOSIS — C801 Malignant (primary) neoplasm, unspecified: Secondary | ICD-10-CM

## 2017-09-05 DIAGNOSIS — Z510 Encounter for antineoplastic radiation therapy: Secondary | ICD-10-CM | POA: Diagnosis not present

## 2017-09-05 LAB — COMPREHENSIVE METABOLIC PANEL
ALBUMIN: 4.3 g/dL (ref 3.5–5.0)
ALK PHOS: 77 U/L (ref 40–150)
ALT: 18 U/L (ref 0–55)
AST: 22 U/L (ref 5–34)
Anion Gap: 14 mEq/L — ABNORMAL HIGH (ref 3–11)
BUN: 19 mg/dL (ref 7.0–26.0)
CALCIUM: 10.1 mg/dL (ref 8.4–10.4)
CO2: 26 mEq/L (ref 22–29)
CREATININE: 0.9 mg/dL (ref 0.6–1.1)
Chloride: 93 mEq/L — ABNORMAL LOW (ref 98–109)
EGFR: >60 mL/min/{1.73_m2} (ref >60)
Glucose: 95 mg/dl (ref 70–140)
POTASSIUM: 4.1 meq/L (ref 3.5–5.1)
Sodium: 133 mEq/L — ABNORMAL LOW (ref 136–145)
Total Bilirubin: 0.88 mg/dL (ref 0.20–1.20)
Total Protein: 7.5 g/dL (ref 6.4–8.3)

## 2017-09-05 LAB — MAGNESIUM: MAGNESIUM: 2.4 mg/dL (ref 1.5–2.5)

## 2017-09-05 MED ORDER — DEXAMETHASONE 2 MG PO TABS: 2.0000 mg | ORAL_TABLET | Freq: Two times a day (BID) | ORAL | 1 refills | Status: DC

## 2017-09-05 NOTE — Telephone Encounter (Signed)
I saw the patient for a work in visit today following radiation due to progressive nausea. Her brain disease was not causing obvious signs of edema on MRI and she was not responding to TID dosing of Zofran. She was also using Ativan prn without relief and not able to keep most foods down. I suggested we check lab work and we agreed to try Dexamethasone 2 mg BID to see if this would help her nausea. She is in agreement.    When results arrived, I called the patient's daughter Darden Amber and reviewed her potassium and magnesium levels were normal. We will follow up to see how she's doing with nausea tomorrow and subsequent days. Her daughter mentioned that one time last week she noticed coughing that caused her to wretch and she noted a small amount of blood tinged emesis. 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 Her daugther is in agreement. We also discussed options for scopalamine patch if the dexamethasone and scheduled Zofran does not work.      Carola Rhine, PAC

## 2017-09-05 NOTE — Progress Notes (Signed)
Ms. Miraya Cudney is here for brain and lung. Pt states that she has had nausea and vomiting x 1 week. Pt also states that she has been taking zofran. Pt states she has been feeling weak. Pt states there is a swishing feeling in her head. Pt states that she feels off balance and has been having reflux and heartburn that last for 15 min. Pt does state that she has a feeling of a lump in her chest area. Worthy Flank PA notified. BP (!) 141/109   Pulse (!) 106   Temp 97.8 F (36.6 C) (Oral)   Wt 122 lb 9.6 oz (55.6 kg)   SpO2 97%   BMI 20.09 kg/m   Wt Readings from Last 3 Encounters:  09/05/17 122 lb 9.6 oz (55.6 kg)  08/23/17 131 lb (59.4 kg)  08/22/17 130 lb 8 oz (59.2 kg)

## 2017-09-06 ENCOUNTER — Ambulatory Visit
Admission: RE | Admit: 2017-09-06 | Discharge: 2017-09-06 | Disposition: A | Discharge status: Home / Self Care | Payer: Managed Care, Other (non HMO) | Source: Ambulatory Visit | Attending: Radiation Oncology | Admitting: Radiation Oncology

## 2017-09-06 DIAGNOSIS — Z510 Encounter for antineoplastic radiation therapy: Secondary | ICD-10-CM | POA: Diagnosis not present

## 2017-09-07 ENCOUNTER — Ambulatory Visit
Admission: RE | Admit: 2017-09-07 | Discharge: 2017-09-07 | Disposition: A | Discharge status: Home / Self Care | Payer: Managed Care, Other (non HMO) | Source: Ambulatory Visit | Attending: Radiation Oncology | Admitting: Radiation Oncology

## 2017-09-07 DIAGNOSIS — Z510 Encounter for antineoplastic radiation therapy: Secondary | ICD-10-CM | POA: Diagnosis not present

## 2017-09-08 ENCOUNTER — Ambulatory Visit
Admission: RE | Admit: 2017-09-08 | Discharge: 2017-09-08 | Disposition: A | Discharge status: Home / Self Care | Payer: Managed Care, Other (non HMO) | Source: Ambulatory Visit | Attending: Radiation Oncology | Admitting: Radiation Oncology

## 2017-09-08 DIAGNOSIS — Z510 Encounter for antineoplastic radiation therapy: Secondary | ICD-10-CM | POA: Diagnosis not present

## 2017-09-09 ENCOUNTER — Encounter: Payer: Self-pay | Admitting: Radiation Oncology

## 2017-09-09 ENCOUNTER — Ambulatory Visit
Admission: RE | Admit: 2017-09-09 | Discharge: 2017-09-09 | Disposition: A | Discharge status: Home / Self Care | Payer: Managed Care, Other (non HMO) | Source: Ambulatory Visit | Attending: Radiation Oncology | Admitting: Radiation Oncology

## 2017-09-09 DIAGNOSIS — Z510 Encounter for antineoplastic radiation therapy: Secondary | ICD-10-CM | POA: Diagnosis not present

## 2017-09-09 MED ORDER — SUCRALFATE 1 G PO TABS: 1.0000 g | ORAL_TABLET | Freq: Four times a day (QID) | ORAL | 2 refills | Status: DC

## 2017-09-13 ENCOUNTER — Observation Stay (HOSPITAL_COMMUNITY)
Admission: AD | Admit: 2017-09-13 | Discharge: 2017-09-14 | Disposition: A | Discharge status: Home / Self Care | Payer: Managed Care, Other (non HMO) | Source: Ambulatory Visit | Attending: Internal Medicine | Admitting: Internal Medicine

## 2017-09-13 ENCOUNTER — Encounter (HOSPITAL_BASED_OUTPATIENT_CLINIC_OR_DEPARTMENT_OTHER): Payer: Managed Care, Other (non HMO) | Admitting: Oncology

## 2017-09-13 ENCOUNTER — Encounter (HOSPITAL_COMMUNITY): Payer: Self-pay

## 2017-09-13 ENCOUNTER — Encounter (HOSPITAL_COMMUNITY): Payer: Managed Care, Other (non HMO)

## 2017-09-13 VITALS — BP 144/101 | HR 135 | Resp 18 | Ht 65.5 in | Wt 114.4 lb

## 2017-09-13 DIAGNOSIS — I10 Essential (primary) hypertension: Secondary | ICD-10-CM | POA: Insufficient documentation

## 2017-09-13 DIAGNOSIS — E222 Syndrome of inappropriate secretion of antidiuretic hormone: Secondary | ICD-10-CM | POA: Diagnosis not present

## 2017-09-13 DIAGNOSIS — R112 Nausea with vomiting, unspecified: Secondary | ICD-10-CM | POA: Diagnosis present

## 2017-09-13 DIAGNOSIS — C7949 Secondary malignant neoplasm of other parts of nervous system: Secondary | ICD-10-CM

## 2017-09-13 DIAGNOSIS — C801 Malignant (primary) neoplasm, unspecified: Secondary | ICD-10-CM

## 2017-09-13 DIAGNOSIS — E871 Hypo-osmolality and hyponatremia: Secondary | ICD-10-CM

## 2017-09-13 DIAGNOSIS — E7800 Pure hypercholesterolemia, unspecified: Secondary | ICD-10-CM | POA: Insufficient documentation

## 2017-09-13 DIAGNOSIS — F17210 Nicotine dependence, cigarettes, uncomplicated: Secondary | ICD-10-CM | POA: Insufficient documentation

## 2017-09-13 DIAGNOSIS — T451X5A Adverse effect of antineoplastic and immunosuppressive drugs, initial encounter: Secondary | ICD-10-CM

## 2017-09-13 DIAGNOSIS — R1110 Vomiting, unspecified: Secondary | ICD-10-CM | POA: Diagnosis present

## 2017-09-13 DIAGNOSIS — C3490 Malignant neoplasm of unspecified part of unspecified bronchus or lung: Secondary | ICD-10-CM | POA: Diagnosis not present

## 2017-09-13 DIAGNOSIS — K219 Gastro-esophageal reflux disease without esophagitis: Secondary | ICD-10-CM | POA: Diagnosis not present

## 2017-09-13 DIAGNOSIS — Z720 Tobacco use: Secondary | ICD-10-CM | POA: Diagnosis present

## 2017-09-13 DIAGNOSIS — C7931 Secondary malignant neoplasm of brain: Secondary | ICD-10-CM | POA: Diagnosis not present

## 2017-09-13 DIAGNOSIS — C7951 Secondary malignant neoplasm of bone: Secondary | ICD-10-CM | POA: Diagnosis not present

## 2017-09-13 DIAGNOSIS — Z66 Do not resuscitate: Secondary | ICD-10-CM | POA: Diagnosis not present

## 2017-09-13 DIAGNOSIS — K869 Disease of pancreas, unspecified: Secondary | ICD-10-CM | POA: Diagnosis not present

## 2017-09-13 DIAGNOSIS — R000 Tachycardia, unspecified: Secondary | ICD-10-CM | POA: Insufficient documentation

## 2017-09-13 DIAGNOSIS — R599 Enlarged lymph nodes, unspecified: Secondary | ICD-10-CM

## 2017-09-13 DIAGNOSIS — C3431 Malignant neoplasm of lower lobe, right bronchus or lung: Secondary | ICD-10-CM

## 2017-09-13 DIAGNOSIS — C259 Malignant neoplasm of pancreas, unspecified: Secondary | ICD-10-CM | POA: Diagnosis present

## 2017-09-13 DIAGNOSIS — Z79899 Other long term (current) drug therapy: Secondary | ICD-10-CM | POA: Diagnosis not present

## 2017-09-13 DIAGNOSIS — E860 Dehydration: Secondary | ICD-10-CM | POA: Diagnosis not present

## 2017-09-13 DIAGNOSIS — G620 Drug-induced polyneuropathy: Secondary | ICD-10-CM | POA: Diagnosis present

## 2017-09-13 LAB — MAGNESIUM: MAGNESIUM: 1.9 mg/dL (ref 1.7–2.4)

## 2017-09-13 LAB — CBC
HCT: 37.5 % (ref 36.0–46.0)
HEMOGLOBIN: 12 g/dL (ref 12.0–15.0)
MCH: 29.4 pg (ref 26.0–34.0)
MCHC: 32 g/dL (ref 30.0–36.0)
MCV: 91.9 fL (ref 78.0–100.0)
PLATELETS: 106 10*3/uL — AB (ref 150–400)
RBC: 4.08 MIL/uL (ref 3.87–5.11)
RDW: 18.7 % — ABNORMAL HIGH (ref 11.5–15.5)
WBC: 5.9 10*3/uL (ref 4.0–10.5)

## 2017-09-13 LAB — BASIC METABOLIC PANEL
ANION GAP: 12 (ref 5–15)
BUN: 32 mg/dL — ABNORMAL HIGH (ref 6–20)
CALCIUM: 9.3 mg/dL (ref 8.9–10.3)
CHLORIDE: 92 mmol/L — AB (ref 101–111)
CO2: 27 mmol/L (ref 22–32)
CREATININE: 0.85 mg/dL (ref 0.44–1.00)
GFR calc non Af Amer: >60 mL/min (ref >60)
Glucose, Bld: 87 mg/dL (ref 65–99)
Potassium: 3.5 mmol/L (ref 3.5–5.1)
SODIUM: 131 mmol/L — AB (ref 135–145)

## 2017-09-13 MED ORDER — GABAPENTIN 300 MG PO CAPS
300.0000 mg | ORAL_CAPSULE | Freq: Three times a day (TID) | ORAL | Status: DC
  Administered 2017-09-13 – 2017-09-14 (×2): 300 mg via ORAL
  Filled 2017-09-13 (×2): qty 1

## 2017-09-13 MED ORDER — LORAZEPAM 1 MG PO TABS: 1.0000 mg | ORAL_TABLET | Freq: Four times a day (QID) | ORAL | Status: DC | PRN

## 2017-09-13 MED ORDER — PROMETHAZINE HCL 25 MG/ML IJ SOLN
25.0000 mg | Freq: Four times a day (QID) | INTRAMUSCULAR | Status: DC | PRN
  Administered 2017-09-14: 25 mg via INTRAVENOUS
  Filled 2017-09-13: qty 1

## 2017-09-13 MED ORDER — BOOST / RESOURCE BREEZE PO LIQD
1.0000 | Freq: Three times a day (TID) | ORAL | Status: DC
  Administered 2017-09-13: 1 via ORAL

## 2017-09-13 MED ORDER — ZOLPIDEM TARTRATE 5 MG PO TABS
5.0000 mg | ORAL_TABLET | Freq: Every evening | ORAL | Status: DC | PRN
  Administered 2017-09-13: 5 mg via ORAL
  Filled 2017-09-13: qty 1

## 2017-09-13 MED ORDER — DEXAMETHASONE 4 MG PO TABS
2.0000 mg | ORAL_TABLET | Freq: Two times a day (BID) | ORAL | Status: DC
  Administered 2017-09-13 – 2017-09-14 (×2): 2 mg via ORAL
  Filled 2017-09-13 (×2): qty 1

## 2017-09-13 MED ORDER — POTASSIUM CHLORIDE IN NACL 20-0.9 MEQ/L-% IV SOLN
INTRAVENOUS | Status: DC
  Administered 2017-09-13 – 2017-09-14 (×2): via INTRAVENOUS

## 2017-09-13 MED ORDER — HEPARIN SODIUM (PORCINE) 5000 UNIT/ML IJ SOLN
5000.0000 [IU] | Freq: Three times a day (TID) | INTRAMUSCULAR | Status: DC
  Administered 2017-09-13 – 2017-09-14 (×2): 5000 [IU] via SUBCUTANEOUS
  Filled 2017-09-13 (×3): qty 1

## 2017-09-13 MED ORDER — DULOXETINE HCL 60 MG PO CPEP
60.0000 mg | ORAL_CAPSULE | Freq: Every day | ORAL | Status: DC
  Administered 2017-09-14: 60 mg via ORAL
  Filled 2017-09-13: qty 1

## 2017-09-13 MED ORDER — PANTOPRAZOLE SODIUM 40 MG IV SOLR
40.0000 mg | INTRAVENOUS | Status: DC
  Administered 2017-09-13 – 2017-09-14 (×2): 40 mg via INTRAVENOUS
  Filled 2017-09-13 (×2): qty 40

## 2017-09-13 NOTE — Progress Notes (Signed)
Practice, [facility] [id number]  No diagnosis found.  CURRENT THERAPY: Salvage Topotecan therapy beginning on 02/28/2017.  INTERVAL HISTORY: Lydia Moore 56 y.o. female returns for followup of small cell lung cancer right lung, limited stage, at time of diagnosis, presenting with SIADH.  S/P 6 cycles of cisplatin/etoposide (07/27/2016-11/11/2016) curative intent.  She was evaluated by radiation oncology for involved field XRT during chemotherapy, but due to tumor burden, she was deemed not an XRT candidate.  She refused whole brain XRT in the prophylactic setting following systemic chemotherapy.  Short interval relapse of disease less than 6 months on restaging CT scans on 02/21/2017 leading to salvage treatment with Topotecan for recurrent rash extensive stage small cell lung cancer.  S/P second opinion at [facility] with Dr. Lissa Morales on 03/10/2017.    Small cell carcinoma ([place])   07/12/2016 Imaging    CTA chest 3.9 x 3.5 cm right lower lobe mass with imaging features most compatible with a primary lung carcinoma.Marked metastatic right hilar and mediastinal adenopathy including a confluent mass of adenopathy in the right hilum, encasing and causing marked narrowing of the central pulmonary arteries on the right with a short segment of occlusion of the right lower lobe bronchus.Mild postobstructive changes in the inferior, medial right lower lobe.Mild changes of COPD       07/16/2016 Procedure    Endoscopic bronchoscopy with transbronchial biopsy of #7 nodes and biopsy of RLL lung mass      07/16/2016 Pathology Results    Lung, biopsy, Right Lower Lobe - SMALL CELL CARCINOMA.       07/21/2016 Imaging    MRI brain Negative MRI of the brain. No evidence for metastatic disease to the brain or meninges.      07/26/2016 PET scan    Markedly hypermetabolic right lower lobe lesion with hypermetabolic metastatic disease in the right hilum  and mediastinum. 2. Several scattered hypermetabolic ground-glass opacities in the right upper lobe. FDG accumulation within these nodules is higher than typically seen for infectious/inflammatory etiology although this remains within the differential. Atypical appearance of metastatic spread would also be a consideration. 3. Coronary artery atherosclerosis. 4. **An incidental finding of potential clinical significance has been found. 3.8 cm abdominal aortic aneurysm       07/27/2016 - 11/11/2016 Chemotherapy    Cisplatin/Etoposide x 6 cycles      11/09/2016 Treatment Plan Change    Patient declined/refused whole brain radiation.      11/23/2016 Imaging    CT chest with No new or progressive findings in the chest. 2. Further interval decrease in size of mediastinal lymphadenopathy. 3. Only trace scarring visible in the right lower lobe, at the location of the previous right lower lobe mass. 4. Coronary artery and thoracoabdominal aortic atherosclerosis      02/21/2017 Imaging    CT CAP- 1. Unfortunately the patient has had significant recurrence with marked mediastinal and right infrahilar adenopathy as well as suspected masses in the collapsed right lower lobe. The confluent adenopathy completely occlude the right lower lobe and right middle lobe bronchi, although there is some air trapping in the right middle lobe ; the right lower lobe is completely collapsed around several right lower lobe masses. 2. There is some speckled density in the left anterior sixth rib which is not entirely specific but which is concerning for early osseous metastatic disease given that it was not present on 11/23/2016. Consider a bone  scan to further assess the skeleton for other occult bony spread. 3. Infrarenal abdominal aortic aneurysm 4.2 cm in diameter, increased from the prior measurement of 3.8 cm on 07/26/2016. Although this is only minor increase, it has occurred in a 7 month period. The  patient has a previous existing relationship of cardiothoracic surgery and surveillance of this aneurysm by surgery is appropriate. 4. Other imaging findings of potential clinical significance: Coronary, aortic arch, and branch vessel atherosclerotic vascular disease. Mild lumbar spondylosis and degenerative disc disease.      02/22/2017 Relapse/Recurrence    Last Treatment Date: 11/11/2016 Recent Lab Values: Recent Lab Values: N/A      02/28/2017 -  Chemotherapy    Topotecan       03/08/2017 Imaging    MRI brain- 1. No evidence of metastatic disease. 2. Intermittently motion degraded.      03/08/2017 Imaging    Bone scan: IMPRESSION: No evidence skeletal metastasis by bone scintigraphy.      03/10/2017 Miscellaneous    Consult at [facility] with Dr. Aniceto Boss:  Following Topotecan therapy, she may be a candidate for immunotherapy at time of progression/relapse, with Nivolumab versus Nivolumab/Ipilumumab.  Response rates with single-agent immunotherapy is ~ 10% and with combo therapy 20-30%.  Responses are durable.  Rate of side effects from therapy are ~ 5% and 20-30%.  This therapy is not yet FDA approved and therefore if this therapy option is approved, prior approval from insurance company will be needed.  Dr. Aniceto Boss favors the combination therapy.  Additionally, she may be eligible for a clinical trial (ie Rova-T + nivolumab/ipilumumab).      04/07/2017 Imaging    CT CAP- 1. Persistent but improved bulky mediastinal and right hilar/infrahilar lymphadenopathy and significant decrease in size of the right lower lobe mass and obstructive pneumonitis. Residual branching endobronchial tumor in the right lower lobe. 2. No findings for pulmonary metastatic nodules or abdominal/pelvic lymphadenopathy. 3. Stable infrarenal abdominal aortic aneurysm.      04/12/2017 Treatment Plan Change    Tx deferred x 1 week due to hyponatremia requiring work-up.      08/19/2017 Imaging    MRI  Brain w/ and w/o contrast: IMPRESSION: New 4 x 8 mm metastasis at the medial left posterior frontal lobe. Three other questionable small metastases, 1 at the posterolateral inferior left parietal lobe, 1 in the right posterior parietal white matter, and 1 in the left frontal region. These cannot be confirmed on multiple sequences. Therefore, it may be worthwhile obtaining a 3 tesla examination, particularly if stereotactic radiotherapy is considered.      08/19/2017 Imaging    CT C/A/P: IMPRESSION: 1. Right lower lobe lung mass is increased since 08/04/2017 and significantly increased since 04/07/2017, with new occlusion of the right lower lobe bronchus and complete right lower lobe atelectasis. 2. Bulky ipsilateral hilar and right paratracheal and subcarinal mediastinal adenopathy, stable since 08/04/2017 and significantly increased since 04/07/2017. 3. New hypodense pancreatic tail mass, most compatible with pancreatic metastasis . 4. No additional sites of metastatic disease in the chest, abdomen or pelvis. 5. New trace dependent right pleural effusion. Increased interlobular septal thickening in the right middle lobe, favor localized pulmonary edema, cannot exclude lymphangitic tumor spread. 6. Small pericardial effusion, stable since 08/04/2017, new since 04/07/2017 . 7. Three-vessel coronary atherosclerosis. 8. Aortic Atherosclerosis (ICD10-I70.0). Mildly increased 4.4 cm Abdominal Aortic Aneurysm (ICD10-I71.9). Recommend follow-up aortic ultrasound in 1 year. This recommendation follows ACR consensus guidelines: White Paper of the ACR Incidental Findings Committee  II on Vascular Findings. J Am Coll Radiol 2013; 10:789-794. 9. Mild colonic diverticulosis.       08/19/2017 Progression    New brain mets, enlarging right lower lobe mass, bulky chest lymphadenopathy, new pancreatic tail lesion       Patient presented for follow up. She started radiation on 07/30/2017.   She received treatment to her chest, and whole brain radiation as well.  Completed radiation on 09/02/2017.  She states that she has had intractable nausea vomiting for the past 2 weeks.  She was placed on Decadron, Ativan, disintegrating Zofran without improvement in her symptoms.  She states she has not been able to keep any food or liquids down.  He denies any diarrhea or abdominal pain.  She does have fatigue and generalized weakness as well.  Review of Systems  Constitutional: Positive for malaise/fatigue. Negative for chills, fever and weight loss.  HENT: Negative.  Negative for hearing loss, sore throat and tinnitus.   Eyes: Negative.  Negative for blurred vision, photophobia and discharge.  Respiratory: Positive for shortness of breath (chronic, stable). Negative for cough, hemoptysis and wheezing.   Cardiovascular: Negative.  Negative for chest pain, palpitations, orthopnea, claudication and leg swelling.  Gastrointestinal: Positive for nausea and vomiting. Negative for abdominal pain, blood in stool, constipation, diarrhea and melena.  Genitourinary: Negative.  Negative for dysuria and hematuria.  Musculoskeletal: Negative.  Negative for back pain, joint pain and myalgias.  Skin: Negative.  Negative for itching and rash.  Neurological: Positive for tingling (neuropathy in hands and feet-chronic) and weakness. Negative for dizziness, tremors, sensory change, speech change, focal weakness, seizures and loss of consciousness.  Endo/Heme/Allergies: Negative.  Negative for environmental allergies and polydipsia. Does not bruise/bleed easily.  Psychiatric/Behavioral: Negative.  Negative for depression. The patient is not nervous/anxious and does not have insomnia.     Past Medical History:  Diagnosis Date  . Elevated cholesterol   . GERD (gastroesophageal reflux disease)   . GI bleed   . Goals of care, counseling/discussion 03/21/2017  . Hypertension   . 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 Small cell carcinoma ([place]) 07/21/2016    Past Surgical History:  Procedure Laterality Date  . ABDOMINAL HYSTERECTOMY     partial  . CESAREAN SECTION    . COLONOSCOPY    . PORTACATH PLACEMENT Left 08/04/2016   Procedure: INSERTION PORT-A-CATH LEFT SUBCLAVIAN;  Surgeon: Aviva Signs, MD;  Location: [facility];  Service: General;  Laterality: Left;  . TONSILLECTOMY    . VIDEO BRONCHOSCOPY WITH ENDOBRONCHIAL ULTRASOUND N/A 07/16/2016   Procedure: VIDEO BRONCHOSCOPY WITH ENDOBRONCHIAL ULTRASOUND with ultrasound transbronchial biopsy of node #7 and right lower lobe lesion;  Surgeon: Grace Isaac, MD;  [facility];  Service: Thoracic;  Laterality: N/A;    Family History  Problem Relation Age of Onset  . Heart disease Mother   . Heart disease Father   . COPD Sister   . HIV Brother     Social History   Social History  . Marital status: Married    Spouse name: N/A  . Number of children: 4  . Years of education: N/A   Occupational History  . [employer]   Social History Main Topics  . Smoking status: Current Every Day Smoker    Packs/day: 0.50    Years: 35.00    Types: Cigarettes  . Smokeless tobacco: Never Used  . Alcohol use Yes     Comment: occasionally, Couple drinks per mo  .  Drug use: Yes    Frequency: 1.0 time per week    Types: Marijuana     Comment: weekly  . Sexual activity: Yes    Birth control/ protection: Surgical     Comment: married   Other Topics Concern  . None   Social History Narrative   Lives w/ husband     PHYSICAL EXAMINATION  ECOG PERFORMANCE STATUS: 0 - Asymptomatic  Vitals:   09/13/17 1355  BP: (!) 144/101  Pulse: (!) 135  Resp: 18      Physical Exam  Constitutional: She is oriented to person, place, and time and well-developed, well-nourished, and in no distress. No distress.  HENT:  Head: Normocephalic and atraumatic.  Mouth/Throat: No oropharyngeal exudate.  Dry oral mucosa  Eyes:  Pupils are equal, round, and reactive to light. Conjunctivae are normal. No scleral icterus.  Neck: Normal range of motion. Neck supple. No JVD present.  Cardiovascular: Normal rate, regular rhythm and normal heart sounds.  Exam reveals no gallop and no friction rub.   No murmur heard. Pulmonary/Chest: Breath sounds normal. No respiratory distress. She has no wheezes. She has no rales.  Abdominal: Soft. Bowel sounds are normal. She exhibits no distension. There is no tenderness. There is no guarding.  Musculoskeletal: She exhibits no edema or tenderness.  Lymphadenopathy:    She has no cervical adenopathy.  Neurological: She is alert and oriented to person, place, and time. No cranial nerve deficit.  Skin: Skin is warm and dry. No rash noted. No erythema. No pallor.  Psychiatric: Affect and judgment normal.     LABORATORY DATA: CBC    Component Value Date/Time   WBC 15.9 (H) 08/04/2017 1208   RBC 2.79 (L) 08/04/2017 1208   HGB 8.6 (L) 08/04/2017 1208   HCT 25.6 (L) 08/04/2017 1208   PLT 57 (L) 08/04/2017 1208   MCV 91.8 08/04/2017 1208   MCH 30.8 08/04/2017 1208   MCHC 33.6 08/04/2017 1208   RDW 20.6 (H) 08/04/2017 1208   LYMPHSABS 0.7 07/19/2017 1046   MONOABS 0.5 07/19/2017 1046   EOSABS 0.1 07/19/2017 1046   BASOSABS 0.1 07/19/2017 1046      Chemistry      Component Value Date/Time   NA 133 (L) 09/05/2017 1513   K 4.1 09/05/2017 1513   CL 94 (L) 08/04/2017 1208   CO2 26 09/05/2017 1513   BUN 19.0 09/05/2017 1513   CREATININE 0.9 09/05/2017 1513      Component Value Date/Time   CALCIUM 10.1 09/05/2017 1513   ALKPHOS 77 09/05/2017 1513   AST 22 09/05/2017 1513   ALT 18 09/05/2017 1513   BILITOT 0.88 09/05/2017 1513        PENDING LABS:   RADIOGRAPHIC STUDIES:  Ct Chest W Contrast  Result Date: 08/19/2017 CLINICAL DATA:  Re- stage right lower lobe small cell lung cancer, limited stage at time of diagnosis in August 2017 with ongoing chemotherapy. EXAM: CT  CHEST, ABDOMEN, AND PELVIS WITH CONTRAST TECHNIQUE: Multidetector CT imaging of the chest, abdomen and pelvis was performed following the standard protocol during bolus administration of intravenous contrast. CONTRAST:  142mL ISOVUE-300 IOPAMIDOL (ISOVUE-300) INJECTION 61% COMPARISON:  04/07/2017 CT chest, abdomen and pelvis. 08/04/2017 chest CT angiogram. FINDINGS: CT CHEST FINDINGS Cardiovascular: Normal heart size. Small pericardial effusion/thickening, stable since 08/04/2017, new since 04/07/2017. Left anterior descending, left circumflex and right coronary atherosclerosis. Atherosclerotic nonaneurysmal thoracic aorta. Normal caliber pulmonary arteries. No central pulmonary emboli. Left subclavian MediPort terminates in the middle third  of the superior vena cava. Mediastinum/Nodes: No discrete thyroid nodules. Unremarkable esophagus. No axillary adenopathy. Bulky right paratracheal adenopathy measuring up to 4.5 cm (series 2/ image 18), stable from 4.5 cm on 08/04/2017 and significantly increased from 2.5 cm on 04/07/2017. Bulky 4.9 cm subcarinal node (series 2/image 28), stable from 4.9 cm on 08/04/2017 and significantly increased from 2.8 cm on 04/07/2017. Bulky right infrahilar adenopathy measuring up to 4.5 cm (series 2/ image 33), stable from 4.5 cm on 08/04/2017 and significantly increased from 2.5 cm on 04/07/2017. No left hilar adenopathy. Lungs/Pleura: No pneumothorax. Trace dependent right pleural effusion, new. No left pleural effusion. New complete right lower lobe atelectasis with occluded right lower lobe bronchus. Irregular right lower lobe 5.0 x 3.5 cm lung mass (series 2/ image 35), increased from 4.8 x 2.9 cm on 08/04/2017 and 3.9 x 2.2 cm on 04/07/2017. Interlobular septal thickening throughout the right middle lobe is increased. No additional significant pulmonary nodules. Musculoskeletal: No aggressive appearing focal osseous lesions. Mild thoracic spondylosis. CT ABDOMEN PELVIS FINDINGS  Hepatobiliary: Normal liver with no liver mass. Normal gallbladder with no radiopaque cholelithiasis. No biliary ductal dilatation. Stable small periampullary duodenal diverticulum. Pancreas: New hypodense 2.2 cm pancreatic tail mass (series 2/ image 56). No pancreatic duct dilation. Spleen: Normal size. No definite mass. Adrenals/Urinary Tract: No discrete adrenal nodules. Normal kidneys with no hydronephrosis and no renal mass. Normal bladder. Stomach/Bowel: Grossly normal stomach. Normal caliber small bowel with no small bowel wall thickening. Normal appendix. Scattered mild colonic diverticulosis, with no large bowel wall thickening or pericolonic fat stranding. Vascular/Lymphatic: Atherosclerotic abdominal aorta with 4.4 cm infrarenal abdominal aortic aneurysm, previously 4.3 cm, minimally increased. Patent portal, splenic, hepatic and renal veins. No pathologically enlarged lymph nodes in the abdomen or pelvis. Reproductive: Status post hysterectomy, with no abnormal findings at the vaginal cuff. No adnexal mass. Other: No pneumoperitoneum, ascites or focal fluid collection. Musculoskeletal: No aggressive appearing focal osseous lesions. Moderate lumbar spondylosis. IMPRESSION: 1. Right lower lobe lung mass is increased since 08/04/2017 and significantly increased since 04/07/2017, with new occlusion of the right lower lobe bronchus and complete right lower lobe atelectasis. 2. Bulky ipsilateral hilar and right paratracheal and subcarinal mediastinal adenopathy, stable since 08/04/2017 and significantly increased since 04/07/2017. 3. New hypodense pancreatic tail mass, most compatible with pancreatic metastasis . 4. No additional sites of metastatic disease in the chest, abdomen or pelvis. 5. New trace dependent right pleural effusion. Increased interlobular septal thickening in the right middle lobe, favor localized pulmonary edema, cannot exclude lymphangitic tumor spread. 6. Small pericardial effusion,  stable since 08/04/2017, new since 04/07/2017 . 7. Three-vessel coronary atherosclerosis. 8. Aortic Atherosclerosis (ICD10-I70.0). Mildly increased 4.4 cm Abdominal Aortic Aneurysm (ICD10-I71.9). Recommend follow-up aortic ultrasound in 1 year. This recommendation follows ACR consensus guidelines: White Paper of the ACR Incidental Findings Committee II on Vascular Findings. J Am Coll Radiol 2013; 10:789-794. 9. Mild colonic diverticulosis. Electronically Signed   By: Ilona Sorrel M.D.   On: 08/19/2017 14:34   Mr Jeri Cos GG Contrast  Result Date: 08/19/2017 CLINICAL DATA:  History of lung cancer.  Restaging. EXAM: MRI HEAD WITHOUT AND WITH CONTRAST TECHNIQUE: Multiplanar, multiecho pulse sequences of the brain and surrounding structures were obtained without and with intravenous contrast. CONTRAST:  37mL MULTIHANCE GADOBENATE DIMEGLUMINE 529 MG/ML IV SOLN COMPARISON:  03/08/2017.  07/21/2016. FINDINGS: Brain: Background pattern shows minimal small vessel change of the cerebral hemispheric white matter. There is a newly seen 4 x 8 mm metastasis at the  medial right posterior frontal vertex. No edema, hemorrhage or mass effect. There are 3 other inconclusive broke questionable small metastases. In the left frontal lobe, there is a punctate focus of restricted diffusion best seen on coronal imaging which could be a tiny nonenhancing metastasis. There is a 3 mm focus of restricted diffusion in the right posterior parietal white matter with questionable smudgy enhancement that could be a small metastasis. There is a questionable 3 mm focus of enhancement at the left posterior inferior parietal lobe that could be a small metastasis. Based on these inconclusive findings, a would probably be worthwhile obtaining a 3 tesla study, particularly if stereotactic treatment is considered. Vascular: Major vessels at the base of the brain show flow. Skull and upper cervical spine: Negative Sinuses/Orbits: Clear/normal Other: None  IMPRESSION: New 4 x 8 mm metastasis at the medial left posterior frontal lobe. Three other questionable small metastases, 1 at the posterolateral inferior left parietal lobe, 1 in the right posterior parietal white matter, and 1 in the left frontal region. These cannot be confirmed on multiple sequences. Therefore, it may be worthwhile obtaining a 3 tesla examination, particularly if stereotactic radiotherapy is considered. Electronically Signed   By: Nelson Chimes M.D.   On: 08/19/2017 10:49   Ct Abdomen Pelvis W Contrast  Result Date: 08/19/2017 CLINICAL DATA:  Re- stage right lower lobe small cell lung cancer, limited stage at time of diagnosis in August 2017 with ongoing chemotherapy. EXAM: CT CHEST, ABDOMEN, AND PELVIS WITH CONTRAST TECHNIQUE: Multidetector CT imaging of the chest, abdomen and pelvis was performed following the standard protocol during bolus administration of intravenous contrast. CONTRAST:  153mL ISOVUE-300 IOPAMIDOL (ISOVUE-300) INJECTION 61% COMPARISON:  04/07/2017 CT chest, abdomen and pelvis. 08/04/2017 chest CT angiogram. FINDINGS: CT CHEST FINDINGS Cardiovascular: Normal heart size. Small pericardial effusion/thickening, stable since 08/04/2017, new since 04/07/2017. Left anterior descending, left circumflex and right coronary atherosclerosis. Atherosclerotic nonaneurysmal thoracic aorta. Normal caliber pulmonary arteries. No central pulmonary emboli. Left subclavian MediPort terminates in the middle third of the superior vena cava. Mediastinum/Nodes: No discrete thyroid nodules. Unremarkable esophagus. No axillary adenopathy. Bulky right paratracheal adenopathy measuring up to 4.5 cm (series 2/ image 18), stable from 4.5 cm on 08/04/2017 and significantly increased from 2.5 cm on 04/07/2017. Bulky 4.9 cm subcarinal node (series 2/image 28), stable from 4.9 cm on 08/04/2017 and significantly increased from 2.8 cm on 04/07/2017. Bulky right infrahilar adenopathy measuring up to 4.5 cm  (series 2/ image 33), stable from 4.5 cm on 08/04/2017 and significantly increased from 2.5 cm on 04/07/2017. No left hilar adenopathy. Lungs/Pleura: No pneumothorax. Trace dependent right pleural effusion, new. No left pleural effusion. New complete right lower lobe atelectasis with occluded right lower lobe bronchus. Irregular right lower lobe 5.0 x 3.5 cm lung mass (series 2/ image 35), increased from 4.8 x 2.9 cm on 08/04/2017 and 3.9 x 2.2 cm on 04/07/2017. Interlobular septal thickening throughout the right middle lobe is increased. No additional significant pulmonary nodules. Musculoskeletal: No aggressive appearing focal osseous lesions. Mild thoracic spondylosis. CT ABDOMEN PELVIS FINDINGS Hepatobiliary: Normal liver with no liver mass. Normal gallbladder with no radiopaque cholelithiasis. No biliary ductal dilatation. Stable small periampullary duodenal diverticulum. Pancreas: New hypodense 2.2 cm pancreatic tail mass (series 2/ image 56). No pancreatic duct dilation. Spleen: Normal size. No definite mass. Adrenals/Urinary Tract: No discrete adrenal nodules. Normal kidneys with no hydronephrosis and no renal mass. Normal bladder. Stomach/Bowel: Grossly normal stomach. Normal caliber small bowel with no small bowel wall thickening.  Normal appendix. Scattered mild colonic diverticulosis, with no large bowel wall thickening or pericolonic fat stranding. Vascular/Lymphatic: Atherosclerotic abdominal aorta with 4.4 cm infrarenal abdominal aortic aneurysm, previously 4.3 cm, minimally increased. Patent portal, splenic, hepatic and renal veins. No pathologically enlarged lymph nodes in the abdomen or pelvis. Reproductive: Status post hysterectomy, with no abnormal findings at the vaginal cuff. No adnexal mass. Other: No pneumoperitoneum, ascites or focal fluid collection. Musculoskeletal: No aggressive appearing focal osseous lesions. Moderate lumbar spondylosis. IMPRESSION: 1. Right lower lobe lung mass is  increased since 08/04/2017 and significantly increased since 04/07/2017, with new occlusion of the right lower lobe bronchus and complete right lower lobe atelectasis. 2. Bulky ipsilateral hilar and right paratracheal and subcarinal mediastinal adenopathy, stable since 08/04/2017 and significantly increased since 04/07/2017. 3. New hypodense pancreatic tail mass, most compatible with pancreatic metastasis . 4. No additional sites of metastatic disease in the chest, abdomen or pelvis. 5. New trace dependent right pleural effusion. Increased interlobular septal thickening in the right middle lobe, favor localized pulmonary edema, cannot exclude lymphangitic tumor spread. 6. Small pericardial effusion, stable since 08/04/2017, new since 04/07/2017 . 7. Three-vessel coronary atherosclerosis. 8. Aortic Atherosclerosis (ICD10-I70.0). Mildly increased 4.4 cm Abdominal Aortic Aneurysm (ICD10-I71.9). Recommend follow-up aortic ultrasound in 1 year. This recommendation follows ACR consensus guidelines: White Paper of the ACR Incidental Findings Committee II on Vascular Findings. J Am Coll Radiol 2013; 10:789-794. 9. Mild colonic diverticulosis. Electronically Signed   By: Ilona Sorrel M.D.   On: 08/19/2017 14:34      ASSESSMENT:  1. 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  S/P 6 cycles of Cisplatin/Etoposide (07/27/2016- 11/11/2016) with curative intent.  She was evaluated by radiation oncology for involved field XRT during chemotherapy, but due to tumor burden, she was deemed not an XRT candidate.  She refused whole brain XRT in the prophylactic setting following systemic chemotherapy.  Short interval relapse of disease <6 months on restaging CT imaging on 02/21/2017 leading to salvage treatment with Topotecan for recurrent/extensive stage SCLC.  S/P second opinion at [facility] with Dr. Lissa Morales on 03/10/2017.  -Completed cycle 6 of topotecan on 07/25/17.  -She has  evidence of progressive disease with new small brain mets, enlarging right lower lobe mass causing complete obstruction of her right lower lobe bronchus and complete atelecstasis, bulky chest lymphadenopathy, new pancreatic tail lesion. She has completed RT to her whole brain and lungs. -As per her previous [facility] recommendations, I have discussed starting treatment with nivolumab/ipilimumab q21 days. However given her current state she is not well enough to start treatment.   2. 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 Treatment of this is to treat the underlying disease. - Stable. Last sodium on10/22/18 was 133. - Advised patient to continue her sodium tablets PO TID. - I have advised patient to fluid restrict to less than a liter of fluids daily. - Encouraged patient to increase salt intake. - Potentially can add a loop diuretic with lasix 20mg  PO BID if worsening sodium level on fluid restriction and sodium tablets.  - if patient should become symptomatic in the future from hyponatremia, such as developing seizures, then she can be placed on tolvaptan 15 mg PO daily.  3. Intractable Nausea/Vomiting and Dehydration -I have spoken to the hospitalist regarding admission inpatient to receive IV hydration and antiemetics to improve her symptoms. She will be directly admitted to step down  bed.   RTC in 10 days for follow up to discuss starting immunotherapy.  All questions were answered. The patient knows to call the clinic with any problems, questions or concerns. We can certainly see the patient much sooner if necessary.   This note is electronically signed by: Twana First, MD 09/13/2017 3:26 PM

## 2017-09-13 NOTE — H&P (Signed)
History and Physical    Lydia Moore YHC:[id number] DOB: [birth year] DOA: 09/13/2017  PCP: [facility]   Patient coming from: Home >> Cancer center   Chief Complaint: Nausea, Vomiting  HPI: Lydia Moore is a 56 y.o. 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 No diarrhea. No abdominal pain. No fever, or chills. No headache . No shortness of breath no cough . No relief with Zofran, was also given Ativan for vomiting 1 mg every 6 hourly without improvement.  Pts daughter reports symptoms  Started after she started radiation therapy. Hospitalist was called for admission as patient was tachycardic with pulse in the 130s, with intractable vomiting for IV hydration.  Review of Systems: As per HPI otherwise 10 point review of systems negative  Past Medical History:  Diagnosis Date  . Elevated cholesterol   . GERD (gastroesophageal reflux disease)   . GI bleed   . Goals of care, counseling/discussion 03/21/2017  . Hypertension   . 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 Small cell carcinoma ([place]) 07/21/2016    Past Surgical History:  Procedure Laterality Date  . ABDOMINAL HYSTERECTOMY     partial  . CESAREAN SECTION    . COLONOSCOPY    . PORTACATH PLACEMENT Left 08/04/2016   Procedure: INSERTION PORT-A-CATH LEFT SUBCLAVIAN;  Surgeon: Aviva Signs, MD;  Location: [facility];  Service: General;  Laterality: Left;  . TONSILLECTOMY    . VIDEO BRONCHOSCOPY WITH ENDOBRONCHIAL ULTRASOUND N/A 07/16/2016   Procedure: VIDEO BRONCHOSCOPY WITH ENDOBRONCHIAL ULTRASOUND with ultrasound transbronchial biopsy of node #7 and right lower lobe lesion;  Surgeon: Grace Isaac, MD;  Location: [facility];  Service: Thoracic;  Laterality: N/A;     reports that she has been smoking Cigarettes.  She has a 17.50 pack-year smoking  history. She has never used smokeless tobacco. She reports that she drinks alcohol. She reports that she uses drugs, including Marijuana, about 1 time per week.  No Known Allergies  Family History  Problem Relation Age of Onset  . Heart disease Mother   . Heart disease Father   . COPD Sister   . HIV Brother     Prior to Admission medications   Medication Sig Start Date End Date Taking? Authorizing Provider  butalbital-acetaminophen-caffeine (FIORICET, ESGIC) 50-325-40 MG tablet Take 1 tablet by mouth every 6 (six) hours as needed for headache. 08/26/17 08/26/18  Holley Bouche, NP  dexamethasone (DECADRON) 2 MG tablet Take 1 tablet (2 mg total) by mouth 2 (two) times daily with a meal. 09/05/17   Hayden Pedro, PA-C  DULoxetine (CYMBALTA) 60 MG capsule TAKE ONE CAPSULE BY MOUTH EVERY DAY 06/27/17   Holley Bouche, NP  gabapentin (NEURONTIN) 300 MG capsule Take 1 capsule (300 mg total) by mouth 4 (four) times daily. Patient taking differently: Take 600 mg by mouth at bedtime.  02/22/17   Baird Cancer, PA-C  LORazepam (ATIVAN) 1 MG tablet Take 1 tablet (1 mg total) by mouth every 6 (six) hours as needed. For nausea. 08/31/17   Hayden Pedro, PA-C  ondansetron (ZOFRAN ODT) 8 MG disintegrating tablet Take 1 tablet (8 mg total) by mouth every 8 (eight) hours as needed for nausea or vomiting. 08/31/17   Hayden Pedro, PA-C  sucralfate (CARAFATE) 1 g tablet Take 1 tablet (1 g total) by mouth 4 (  four) times daily. 09/09/17   Kyung Rudd, MD  zolpidem (AMBIEN) 10 MG tablet Take 1 tablet (10 mg total) by mouth at bedtime as needed for sleep. 08/22/17 09/21/17  Twana First, MD    Physical Exam: Vitals:   09/13/17 1523  BP: (!) 148/98  Pulse: (!) 112  Resp: 18  Temp: 97.8 F (36.6 C)  TempSrc: Oral  SpO2: 95%  Weight: 51.7 kg (114 lb)  Height: 5\' 5"  (1.651 m)    Constitutional: NAD, calm, comfortable Vitals:   09/13/17 1523  BP: (!) 148/98  Pulse:  (!) 112  Resp: 18  Temp: 97.8 F (36.6 C)  TempSrc: Oral  SpO2: 95%  Weight: 51.7 kg (114 lb)  Height: 5\' 5"  (1.651 m)   Eyes: PERRL, lids and conjunctivae normal ENMT: Mucous membranes are dry. Posterior pharynx clear of any exudate or lesions. Neck: normal, supple, no masses, no thyromegaly Respiratory: Reduced breath sounds right lung, No accessory muscle use.  Cardiovascular: mild Tachycardia, regular rate and rhythm, no murmurs / rubs / gallops.  Abdomen: no tenderness, no masses palpated. No hepatosplenomegaly. Bowel sounds positive.  Musculoskeletal: no clubbing / cyanosis. No joint deformity upper and lower extremities. Good ROM, no contractures. Normal muscle tone.  Skin: no rashes, lesions, ulcers. No induration Neurologic: CN 2-12 grossly intact. Sensation intact,  Strength 5/5 in all 4.  Psychiatric: Normal judgment and insight. Alert and oriented x 3. Normal mood.   Labs on Admission: I have personally reviewed following labs and imaging studies  Urine analysis:    Component Value Date/Time   COLORURINE YELLOW 07/12/2016 1200   APPEARANCEUR CLEAR 07/12/2016 1200   LABSPEC >1.030 (H) 07/12/2016 1200   PHURINE 5.5 07/12/2016 1200   GLUCOSEU NEGATIVE 07/12/2016 1200   HGBUR NEGATIVE 07/12/2016 1200   BILIRUBINUR NEGATIVE 07/12/2016 1200   KETONESUR TRACE (A) 07/12/2016 1200   PROTEINUR TRACE (A) 07/12/2016 1200   NITRITE NEGATIVE 07/12/2016 1200   LEUKOCYTESUR NEGATIVE 07/12/2016 1200    Radiological Exams on Admission: No results found.  EKG: None Assessment/Plan Principal Problem:   Intractable vomiting Active Problems:   Small cell carcinoma (H[facility])   Tobacco abuse   Peripheral neuropathy due to chemotherapy [facility])   Secondary malignant neoplasm of brain and spinal cord (H[facility])   Intractable vomiting with nausea   Intractable vomiting- ?relation to radiation therapy. Completed last chemotherapy 07/25/2017. Tachycardia likely from dehydration. No abdominal  pain, no diarrhea. - Phenergan - Hydrate - Bowel rest with Clear liquid diet - CBC, BMP. Mag-  - IV protonix 40 daily for GI protection - Continue home Ativan, she  Has been taking 1 mg every 6 hourly  Stage IV metastatic lung cancer- 08/19/2017- New brain metastasis, enlarging right lower lung mass. Now on radiation therapy. Episode of hemoptysis a week ago resolved. Followed up with oncology today. - Patient is DO NOT RESUSCITATE - Continue home Decadron  DVT prophylaxis: Heparin Code Status: DO NOT RESUSCITATE Family Communication: Patient's daughter at bedside Disposition Plan: 1-2 days Consults called: None Admission status: Observation telemetry  Bethena Roys MD [facility] Pager [phone], please contact night-coverage [email or web address] Password [email or web address]  09/13/2017, 5:56 PM

## 2017-09-13 NOTE — Progress Notes (Signed)
56 year old female with a history of hypertension, hyperlipidemia, and small cell lung cancer that is now metastatic to the brain and pancreas receiving whole brain radiation presented to the clinic with intractable nausea and vomiting.  The patient was afebrile hemodynamically stable but tachycardic up to the 130s.  She is unable to keep down her pills or fluids.  As result, direct admission was requested for hydration and management of her intractable vomiting.  The patient was admitted to a stepdown bed secondary to her profound tachycardia.  DTat

## 2017-09-14 DIAGNOSIS — G43A1 Cyclical vomiting, intractable: Secondary | ICD-10-CM

## 2017-09-14 DIAGNOSIS — C259 Malignant neoplasm of pancreas, unspecified: Secondary | ICD-10-CM | POA: Diagnosis not present

## 2017-09-14 MED ORDER — HEPARIN SOD (PORK) LOCK FLUSH 100 UNIT/ML IV SOLN
500.0000 [IU] | INTRAVENOUS | Status: AC | PRN
  Administered 2017-09-14: 500 [IU]
  Filled 2017-09-14: qty 5

## 2017-09-14 MED ORDER — METOPROLOL TARTRATE 5 MG/5ML IV SOLN
5.0000 mg | Freq: Once | INTRAVENOUS | Status: AC
  Administered 2017-09-14: 5 mg via INTRAVENOUS
  Filled 2017-09-14: qty 5

## 2017-09-14 MED ORDER — PROMETHAZINE HCL 12.5 MG PO TABS: 12.5000 mg | ORAL_TABLET | Freq: Four times a day (QID) | ORAL | 0 refills | Status: DC | PRN

## 2017-09-14 NOTE — Progress Notes (Signed)
Patient to be discharged and requested her right chest port be left accessed on discharge after being flushed per request of the [facility] staff since she will return tomorrow and Friday for pre-treatment IV fluids. Discussed with Dr. Jerilee Hoh, stated okay to leave port accessed on discharge once nursing followed up with [facility] staff. Spoke with Anastasio Champion, RN in [facility]. Stated port may be left accessed for her appointment tomorrow as long as flushed and biopatch dressing in place. Flushed port with heparin per protocol as ordered. New dressing and biopatch applied prior to discharge. Dressing clean,dry and intact. pt verbalized understanding of care of dressing site, not to allow it to get wet/soiled, etc. Donavan Foil, RN

## 2017-09-14 NOTE — Progress Notes (Signed)
Patient has elevated BP 155/110 this afternoon. Received metoprolol 5 mg IV x 1 dose this am per orders for elevated BP. Text paged Dr. Jerilee Hoh to notify of BP remaining elevated. Donavan Foil, RN

## 2017-09-14 NOTE — Progress Notes (Signed)
Discharge instructions reviewed with patient. Given copy of AVS, prescription and reviewed home medication list and upcoming appointments with her. Verbalized understanding. No complaints prior to discharge. Port flushed per protocol and left accessed for cancer center appointment tomorrow, okay per MD. See previous note. Pt in stable condition with family at bedside. Awaiting w/c and nurse tech assist to leave unit for discharge home. Donavan Foil, RN

## 2017-09-14 NOTE — Discharge Summary (Signed)
Physician Discharge Summary  Lydia Moore ZTI:[id number] DOB: [birth year] DOA: 09/13/2017  PCP: [facility]  Admit date: 09/13/2017 Discharge date: 09/14/2017  Time spent: 45 minutes  Recommendations for Outpatient Follow-up:  -Patient will be discharged home today. -Advised to follow-up with PCP and oncologist as previously scheduled.  Discharge Diagnoses:  Principal Problem:   Intractable vomiting Active Problems:   Small cell carcinoma (HCC)   Tobacco abuse   Peripheral neuropathy due to chemotherapy [facility])   Secondary malignant neoplasm of brain and spinal cord (HCC)   Intractable vomiting with nausea   Discharge Condition: Stable and improved  Filed Weights   09/13/17 1523  Weight: 51.7 kg (114 lb)    History of present illness:  As per Dr. Denton Brick on 10/30: Lydia Moore is a 56 y.o. 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 No diarrhea. No abdominal pain. No fever, or chills. No headache . No shortness of breath no cough . No relief with Zofran, was also given Ativan for vomiting 1 mg every 6 hourly without improvement.  Pts daughter reports symptoms  Started after she started radiation therapy. Hospitalist was called for admission as patient was tachycardic with pulse in the 130s, with intractable vomiting for IV hydration.  Hospital Course:   Intractable nausea and vomiting -Question related to radiation therapy versus acute viral gastroenteritis. -Has resolved with fluids, Phenergan. -Okay for discharge home today.  Stage IV metastatic lung cancer -Continue follow-up with oncology, scheduled for tomorrow.  Procedures:  None  Consultations:  None  Discharge Instructions  Discharge Instructions    Diet - low sodium heart healthy    Complete by:  As directed    Increase activity  slowly    Complete by:  As directed      Allergies as of 09/14/2017   No Known Allergies     Medication List    STOP taking these medications   ondansetron 8 MG disintegrating tablet Commonly known as:  ZOFRAN ODT     TAKE these medications   butalbital-acetaminophen-caffeine 50-325-40 MG tablet Commonly known as:  FIORICET, ESGIC Take 1 tablet by mouth every 6 (six) hours as needed for headache.   dexamethasone 2 MG tablet Commonly known as:  DECADRON Take 1 tablet (2 mg total) by mouth 2 (two) times daily with a meal.   DULoxetine 60 MG capsule Commonly known as:  CYMBALTA TAKE ONE CAPSULE BY MOUTH EVERY DAY   gabapentin 300 MG capsule Commonly known as:  NEURONTIN Take 1 capsule (300 mg total) by mouth 4 (four) times daily. What changed:  how much to take  when to take this   LORazepam 1 MG tablet Commonly known as:  ATIVAN Take 1 tablet (1 mg total) by mouth every 6 (six) hours as needed. For nausea.   promethazine 12.5 MG tablet Commonly known as:  PHENERGAN Take 1 tablet (12.5 mg total) by mouth every 6 (six) hours as needed for nausea or vomiting.   sucralfate 1 g tablet Commonly known as:  CARAFATE Take 1 tablet (1 g total) by mouth 4 (four) times daily.   zolpidem 10 MG tablet Commonly known as:  AMBIEN Take 1 tablet (10 mg total) by mouth at bedtime as needed for sleep.      No Known Allergies Follow-up Information    [facility]. Schedule an appointment  as soon as possible for a visit in 2 week(s).   Contact information: [place] [phone]            The results of significant diagnostics from this hospitalization (including imaging, microbiology, ancillary and laboratory) are listed below for reference.    Significant Diagnostic Studies: Ct Chest W Contrast  Result Date: 08/19/2017 CLINICAL DATA:  Re- stage right lower lobe small cell lung cancer, limited stage at time of diagnosis in August  2017 with ongoing chemotherapy. EXAM: CT CHEST, ABDOMEN, AND PELVIS WITH CONTRAST TECHNIQUE: Multidetector CT imaging of the chest, abdomen and pelvis was performed following the standard protocol during bolus administration of intravenous contrast. CONTRAST:  141mL ISOVUE-300 IOPAMIDOL (ISOVUE-300) INJECTION 61% COMPARISON:  04/07/2017 CT chest, abdomen and pelvis. 08/04/2017 chest CT angiogram. FINDINGS: CT CHEST FINDINGS Cardiovascular: Normal heart size. Small pericardial effusion/thickening, stable since 08/04/2017, new since 04/07/2017. Left anterior descending, left circumflex and right coronary atherosclerosis. Atherosclerotic nonaneurysmal thoracic aorta. Normal caliber pulmonary arteries. No central pulmonary emboli. Left subclavian MediPort terminates in the middle third of the superior vena cava. Mediastinum/Nodes: No discrete thyroid nodules. Unremarkable esophagus. No axillary adenopathy. Bulky right paratracheal adenopathy measuring up to 4.5 cm (series 2/ image 18), stable from 4.5 cm on 08/04/2017 and significantly increased from 2.5 cm on 04/07/2017. Bulky 4.9 cm subcarinal node (series 2/image 28), stable from 4.9 cm on 08/04/2017 and significantly increased from 2.8 cm on 04/07/2017. Bulky right infrahilar adenopathy measuring up to 4.5 cm (series 2/ image 33), stable from 4.5 cm on 08/04/2017 and significantly increased from 2.5 cm on 04/07/2017. No left hilar adenopathy. Lungs/Pleura: No pneumothorax. Trace dependent right pleural effusion, new. No left pleural effusion. New complete right lower lobe atelectasis with occluded right lower lobe bronchus. Irregular right lower lobe 5.0 x 3.5 cm lung mass (series 2/ image 35), increased from 4.8 x 2.9 cm on 08/04/2017 and 3.9 x 2.2 cm on 04/07/2017. Interlobular septal thickening throughout the right middle lobe is increased. No additional significant pulmonary nodules. Musculoskeletal: No aggressive appearing focal osseous lesions. Mild thoracic  spondylosis. CT ABDOMEN PELVIS FINDINGS Hepatobiliary: Normal liver with no liver mass. Normal gallbladder with no radiopaque cholelithiasis. No biliary ductal dilatation. Stable small periampullary duodenal diverticulum. Pancreas: New hypodense 2.2 cm pancreatic tail mass (series 2/ image 56). No pancreatic duct dilation. Spleen: Normal size. No definite mass. Adrenals/Urinary Tract: No discrete adrenal nodules. Normal kidneys with no hydronephrosis and no renal mass. Normal bladder. Stomach/Bowel: Grossly normal stomach. Normal caliber small bowel with no small bowel wall thickening. Normal appendix. Scattered mild colonic diverticulosis, with no large bowel wall thickening or pericolonic fat stranding. Vascular/Lymphatic: Atherosclerotic abdominal aorta with 4.4 cm infrarenal abdominal aortic aneurysm, previously 4.3 cm, minimally increased. Patent portal, splenic, hepatic and renal veins. No pathologically enlarged lymph nodes in the abdomen or pelvis. Reproductive: Status post hysterectomy, with no abnormal findings at the vaginal cuff. No adnexal mass. Other: No pneumoperitoneum, ascites or focal fluid collection. Musculoskeletal: No aggressive appearing focal osseous lesions. Moderate lumbar spondylosis. IMPRESSION: 1. Right lower lobe lung mass is increased since 08/04/2017 and significantly increased since 04/07/2017, with new occlusion of the right lower lobe bronchus and complete right lower lobe atelectasis. 2. Bulky ipsilateral hilar and right paratracheal and subcarinal mediastinal adenopathy, stable since 08/04/2017 and significantly increased since 04/07/2017. 3. New hypodense pancreatic tail mass, most compatible with pancreatic metastasis . 4. No additional sites of metastatic disease in the chest, abdomen or pelvis. 5. New  trace dependent right pleural effusion. Increased interlobular septal thickening in the right middle lobe, favor localized pulmonary edema, cannot exclude lymphangitic tumor  spread. 6. Small pericardial effusion, stable since 08/04/2017, new since 04/07/2017 . 7. Three-vessel coronary atherosclerosis. 8. Aortic Atherosclerosis (ICD10-I70.0). Mildly increased 4.4 cm Abdominal Aortic Aneurysm (ICD10-I71.9). Recommend follow-up aortic ultrasound in 1 year. This recommendation follows ACR consensus guidelines: White Paper of the ACR Incidental Findings Committee II on Vascular Findings. J Am Coll Radiol 2013; 10:789-794. 9. Mild colonic diverticulosis. Electronically Signed   By: Ilona Sorrel M.D.   On: 08/19/2017 14:34   Mr Jeri Cos WJ Contrast  Result Date: 08/19/2017 CLINICAL DATA:  History of lung cancer.  Restaging. EXAM: MRI HEAD WITHOUT AND WITH CONTRAST TECHNIQUE: Multiplanar, multiecho pulse sequences of the brain and surrounding structures were obtained without and with intravenous contrast. CONTRAST:  62mL MULTIHANCE GADOBENATE DIMEGLUMINE 529 MG/ML IV SOLN COMPARISON:  03/08/2017.  07/21/2016. FINDINGS: Brain: Background pattern shows minimal small vessel change of the cerebral hemispheric white matter. There is a newly seen 4 x 8 mm metastasis at the medial right posterior frontal vertex. No edema, hemorrhage or mass effect. There are 3 other inconclusive broke questionable small metastases. In the left frontal lobe, there is a punctate focus of restricted diffusion best seen on coronal imaging which could be a tiny nonenhancing metastasis. There is a 3 mm focus of restricted diffusion in the right posterior parietal white matter with questionable smudgy enhancement that could be a small metastasis. There is a questionable 3 mm focus of enhancement at the left posterior inferior parietal lobe that could be a small metastasis. Based on these inconclusive findings, a would probably be worthwhile obtaining a 3 tesla study, particularly if stereotactic treatment is considered. Vascular: Major vessels at the base of the brain show flow. Skull and upper cervical spine: Negative  Sinuses/Orbits: Clear/normal Other: None IMPRESSION: New 4 x 8 mm metastasis at the medial left posterior frontal lobe. Three other questionable small metastases, 1 at the posterolateral inferior left parietal lobe, 1 in the right posterior parietal white matter, and 1 in the left frontal region. These cannot be confirmed on multiple sequences. Therefore, it may be worthwhile obtaining a 3 tesla examination, particularly if stereotactic radiotherapy is considered. Electronically Signed   By: Nelson Chimes M.D.   On: 08/19/2017 10:49   Ct Abdomen Pelvis W Contrast  Result Date: 08/19/2017 CLINICAL DATA:  Re- stage right lower lobe small cell lung cancer, limited stage at time of diagnosis in August 2017 with ongoing chemotherapy. EXAM: CT CHEST, ABDOMEN, AND PELVIS WITH CONTRAST TECHNIQUE: Multidetector CT imaging of the chest, abdomen and pelvis was performed following the standard protocol during bolus administration of intravenous contrast. CONTRAST:  145mL ISOVUE-300 IOPAMIDOL (ISOVUE-300) INJECTION 61% COMPARISON:  04/07/2017 CT chest, abdomen and pelvis. 08/04/2017 chest CT angiogram. FINDINGS: CT CHEST FINDINGS Cardiovascular: Normal heart size. Small pericardial effusion/thickening, stable since 08/04/2017, new since 04/07/2017. Left anterior descending, left circumflex and right coronary atherosclerosis. Atherosclerotic nonaneurysmal thoracic aorta. Normal caliber pulmonary arteries. No central pulmonary emboli. Left subclavian MediPort terminates in the middle third of the superior vena cava. Mediastinum/Nodes: No discrete thyroid nodules. Unremarkable esophagus. No axillary adenopathy. Bulky right paratracheal adenopathy measuring up to 4.5 cm (series 2/ image 18), stable from 4.5 cm on 08/04/2017 and significantly increased from 2.5 cm on 04/07/2017. Bulky 4.9 cm subcarinal node (series 2/image 28), stable from 4.9 cm on 08/04/2017 and significantly increased from 2.8 cm on 04/07/2017. Bulky right  infrahilar adenopathy measuring up to 4.5 cm (series 2/ image 33), stable from 4.5 cm on 08/04/2017 and significantly increased from 2.5 cm on 04/07/2017. No left hilar adenopathy. Lungs/Pleura: No pneumothorax. Trace dependent right pleural effusion, new. No left pleural effusion. New complete right lower lobe atelectasis with occluded right lower lobe bronchus. Irregular right lower lobe 5.0 x 3.5 cm lung mass (series 2/ image 35), increased from 4.8 x 2.9 cm on 08/04/2017 and 3.9 x 2.2 cm on 04/07/2017. Interlobular septal thickening throughout the right middle lobe is increased. No additional significant pulmonary nodules. Musculoskeletal: No aggressive appearing focal osseous lesions. Mild thoracic spondylosis. CT ABDOMEN PELVIS FINDINGS Hepatobiliary: Normal liver with no liver mass. Normal gallbladder with no radiopaque cholelithiasis. No biliary ductal dilatation. Stable small periampullary duodenal diverticulum. Pancreas: New hypodense 2.2 cm pancreatic tail mass (series 2/ image 56). No pancreatic duct dilation. Spleen: Normal size. No definite mass. Adrenals/Urinary Tract: No discrete adrenal nodules. Normal kidneys with no hydronephrosis and no renal mass. Normal bladder. Stomach/Bowel: Grossly normal stomach. Normal caliber small bowel with no small bowel wall thickening. Normal appendix. Scattered mild colonic diverticulosis, with no large bowel wall thickening or pericolonic fat stranding. Vascular/Lymphatic: Atherosclerotic abdominal aorta with 4.4 cm infrarenal abdominal aortic aneurysm, previously 4.3 cm, minimally increased. Patent portal, splenic, hepatic and renal veins. No pathologically enlarged lymph nodes in the abdomen or pelvis. Reproductive: Status post hysterectomy, with no abnormal findings at the vaginal cuff. No adnexal mass. Other: No pneumoperitoneum, ascites or focal fluid collection. Musculoskeletal: No aggressive appearing focal osseous lesions. Moderate lumbar spondylosis.  IMPRESSION: 1. Right lower lobe lung mass is increased since 08/04/2017 and significantly increased since 04/07/2017, with new occlusion of the right lower lobe bronchus and complete right lower lobe atelectasis. 2. Bulky ipsilateral hilar and right paratracheal and subcarinal mediastinal adenopathy, stable since 08/04/2017 and significantly increased since 04/07/2017. 3. New hypodense pancreatic tail mass, most compatible with pancreatic metastasis . 4. No additional sites of metastatic disease in the chest, abdomen or pelvis. 5. New trace dependent right pleural effusion. Increased interlobular septal thickening in the right middle lobe, favor localized pulmonary edema, cannot exclude lymphangitic tumor spread. 6. Small pericardial effusion, stable since 08/04/2017, new since 04/07/2017 . 7. Three-vessel coronary atherosclerosis. 8. Aortic Atherosclerosis (ICD10-I70.0). Mildly increased 4.4 cm Abdominal Aortic Aneurysm (ICD10-I71.9). Recommend follow-up aortic ultrasound in 1 year. This recommendation follows ACR consensus guidelines: White Paper of the ACR Incidental Findings Committee II on Vascular Findings. J Am Coll Radiol 2013; 10:789-794. 9. Mild colonic diverticulosis. Electronically Signed   By: Ilona Sorrel M.D.   On: 08/19/2017 14:34    Microbiology: No results found for this or any previous visit (from the past 240 hour(s)).   Labs: Basic Metabolic Panel:  Recent Labs Lab 09/13/17 2009  NA 131*  K 3.5  CL 92*  CO2 27  GLUCOSE 87  BUN 32*  CREATININE 0.85  CALCIUM 9.3  MG 1.9   Liver Function Tests: No results for input(s): AST, ALT, ALKPHOS, BILITOT, PROT, ALBUMIN in the last 168 hours. No results for input(s): LIPASE, AMYLASE in the last 168 hours. No results for input(s): AMMONIA in the last 168 hours. CBC:  Recent Labs Lab 09/13/17 2009  WBC 5.9  HGB 12.0  HCT 37.5  MCV 91.9  PLT 106*   Cardiac Enzymes: No results for input(s): CKTOTAL, CKMB, CKMBINDEX,  TROPONINI in the last 168 hours. BNP: BNP (last 3 results) No results for input(s): BNP in the last 8760 hours.  ProBNP (last 3 results) No results for input(s): PROBNP in the last 8760 hours.  CBG: No results for input(s): GLUCAP in the last 168 hours.     SignedLelon Frohlich  [facility] Pager: [phone] 09/14/2017, 5:25 PM

## 2017-09-14 NOTE — Progress Notes (Signed)
Nutrition Brief Note  Patient identified on the Malnutrition Screening Tool (MST) Report  Pt reports she is going home shortly, as such, only briefly visited with patient.  RD presented patient with handouts for managing nausea/vomiting from the Oncology practice group of the [employer]. Mentioned other supportive methods for potentially alleviating her nausea such as ginger extract and seabands. Gave lists of items that should be consumed vs avoided when severely nauseated.   She believes it was the radiation that caused her n/v and she says she has completed her radiotherapy last week. As such, she feels she will be able to return to her pre-radiation baseline, which included a good appetite and weight maintenance.    No nutrition interventions warranted at this time. If nutrition issues arise, please consult RD.   Burtis Junes RD, LDN, CNSC Clinical Nutrition Pager: [phone] 09/14/2017 3:11 PM

## 2017-09-15 ENCOUNTER — Encounter (HOSPITAL_COMMUNITY): Payer: Managed Care, Other (non HMO) | Attending: Hematology & Oncology

## 2017-09-15 DIAGNOSIS — C801 Malignant (primary) neoplasm, unspecified: Secondary | ICD-10-CM

## 2017-09-15 LAB — HIV ANTIBODY (ROUTINE TESTING W REFLEX): HIV SCREEN 4TH GENERATION: NONREACTIVE

## 2017-09-15 MED ORDER — SODIUM CHLORIDE 0.9 % IV SOLN
INTRAVENOUS | Status: DC
  Administered 2017-09-15: 11:00:00 via INTRAVENOUS

## 2017-09-15 NOTE — Progress Notes (Signed)
  Radiation Oncology         [phone]) [phone] ________________________________  Name: Lydia Moore MRN: [id number]  Date: 08/23/2017  DOB: 04/01/1961  SIMULATION AND TREATMENT PLANNING NOTE  DIAGNOSIS:     ICD-10-CM   1. Secondary malignant neoplasm of brain and spinal cord ([place]) C79.31    C79.49      Site:   1.  Whole brain XRT 2.  Right lung/ mediastinum  NARRATIVE:  The patient was brought to the [facility].  Identity was confirmed.  All relevant records and images related to the planned course of therapy were reviewed.   Written consent to proceed with treatment was confirmed which was freely given after reviewing the details related to the planned course of therapy had been reviewed with the patient.  Then, the patient was set-up in a stable reproducible  supine position for radiation therapy.  CT images were obtained.  Surface markings were placed.    Medically necessary complex treatment device(s) for immobilization:  Customized thermoplastic mask.   The CT images were loaded into the planning software.  Then the target and avoidance structures were contoured.  Treatment planning then occurred.  The radiation prescription was entered and confirmed.  A total of 7 complex treatment devices were fabricated which relate to the designed radiation treatment fields:  4 fields to treat the brain using a reduced field technique to improve homogeneity and 3 fields to treat the chest. Each of these customized fields/ complex treatment devices will be used on a daily basis during the radiation course. I have requested : 3D Simulation  I have requested a DVH of the following structures: chest target, lungs, cord.   The patient will undergo daily image guidance to ensure accurate localization of the target, and adequate minimize dose to the normal surrounding structures in close proximity to the target.   PLAN:  The patient will receive 30 Gy in 10 fractions to both  areas.  ________________________________   Jodelle Gross, MD, PhD

## 2017-09-15 NOTE — Progress Notes (Signed)
Tolerated infusion w/o adverse reaction.  Pt reports that she was instructed in the ED to stop taking her BP medication d/t blood pressure dropping, so she has not taken her blood pressure medication for several days. Dr. Talbert Cage notified of pt's vital signs.  Instructed pt to restart BP meds per MD.  Pt verbalizes understanding.  Discharged ambulatory in c/o daughter.

## 2017-09-15 NOTE — Progress Notes (Signed)
  Radiation Oncology         [phone]) [phone] ________________________________  Name: Lydia Moore MRN: [id number]  Date: 09/09/2017  DOB: Jun 08, 1961  End of Treatment Note  Diagnosis:   56 y.o. female with progressive metastatic disease stage small cell carcinoma of the right lung now with brain disease, and progressive disease in the right chest     Indication for treatment::  palliative       Radiation treatment dates:   08/29/2017 - 09/09/2017  Site/dose:   The brain and right lung were each treated to 30 Gy in 10 fractions of 3 Gy.  Narrative: The patient tolerated radiation treatment relatively well.   She experienced severe fatigue. She had nausea/vomiting throughout treatment for which she was given a prescription for Zofran and Ativan. She reported pain with swallowing and felt like she had a lump in her chest when swallowing. She was given a prescription for Carafate. Due to nausea/vomiting and odynophagia she subsequently lost about 5 pounds over the course of treatment. She also reported a productive cough with occasional thick clear-white sputum. She denied any shortness of breath or skin irritation to the treatment field.  Plan: The patient has completed radiation treatment. She will continue her current steroids. I will help arrange her upcoming MRI scan through our brain tumor program. I have also called in a prescription for Carafate. The patient will return to radiation oncology clinic for routine followup in one month. I advised the patient to call or return sooner if they have any questions or concerns related to their recovery or treatment. ________________________________  Jodelle Gross, MD, PhD  This document serves as a record of services personally performed by Kyung Rudd, MD. It was created on his behalf by Rae Lips, a trained medical scribe. The creation of this record is based on the scribe's personal observations and the provider's statements to them. This  document has been checked and approved by the attending provider.

## 2017-09-16 ENCOUNTER — Encounter (HOSPITAL_BASED_OUTPATIENT_CLINIC_OR_DEPARTMENT_OTHER): Payer: Managed Care, Other (non HMO)

## 2017-09-16 ENCOUNTER — Encounter (HOSPITAL_COMMUNITY): Payer: Self-pay

## 2017-09-16 ENCOUNTER — Encounter (HOSPITAL_COMMUNITY): Payer: Managed Care, Other (non HMO)

## 2017-09-16 VITALS — BP 148/96 | HR 100 | Temp 98.2°F | Resp 18

## 2017-09-16 DIAGNOSIS — C3431 Malignant neoplasm of lower lobe, right bronchus or lung: Secondary | ICD-10-CM

## 2017-09-16 DIAGNOSIS — C7949 Secondary malignant neoplasm of other parts of nervous system: Secondary | ICD-10-CM

## 2017-09-16 DIAGNOSIS — C801 Malignant (primary) neoplasm, unspecified: Secondary | ICD-10-CM

## 2017-09-16 DIAGNOSIS — C7931 Secondary malignant neoplasm of brain: Secondary | ICD-10-CM

## 2017-09-16 MED ORDER — SODIUM CHLORIDE 0.9 % IV SOLN
INTRAVENOUS | Status: DC
  Administered 2017-09-16: 10:00:00 via INTRAVENOUS

## 2017-09-16 MED ORDER — HEPARIN SOD (PORK) LOCK FLUSH 100 UNIT/ML IV SOLN
500.0000 [IU] | Freq: Once | INTRAVENOUS | Status: AC
  Administered 2017-09-16: 500 [IU] via INTRAVENOUS

## 2017-09-16 MED ORDER — HEPARIN SOD (PORK) LOCK FLUSH 100 UNIT/ML IV SOLN
INTRAVENOUS | Status: AC
  Filled 2017-09-16: qty 5

## 2017-09-16 MED ORDER — SODIUM CHLORIDE 0.9% FLUSH
10.0000 mL | Freq: Once | INTRAVENOUS | Status: AC
  Administered 2017-09-16: 10 mL via INTRAVENOUS

## 2017-09-16 NOTE — Progress Notes (Signed)
To treatment area for hydration.  Patient stated she would like to have hydration next week, too.  Port site flushed easily with good blood return.  Patient stated she took her blood pressure 20 minutes before appointment.  Family at side.   Appointments made for hydration x 3 next week.  Patient tolerated hydration today with no complaints voiced.  Port site clean and dry with no bruising or swelling noted at site.  Band aid applied.  VSS with discharge and left ambulatory with family.  No s/s of distress noted.

## 2017-09-16 NOTE — Patient Instructions (Signed)
[place] [facility]  Discharge Instructions:  You received hydration today. Keep next scheduled appointments and call for any questions or concerns.   _______________________________________________________________  Thank you for choosing [place] at [facility] to provide your oncology and hematology care.  To afford each patient quality time with our providers, please arrive at least 15 minutes before your scheduled appointment.  You need to re-schedule your appointment if you arrive 10 or more minutes late.  We strive to give you quality time with our providers, and arriving late affects you and other patients whose appointments are after yours.  Also, if you no show three or more times for appointments you may be dismissed from the clinic.  Again, thank you for choosing [place] [facility] [place] hope is that these requests will allow you access to exceptional care and in a timely manner. _______________________________________________________________  If you have questions after your visit, please contact our office at [phone] between the hours of 8:30 a.m. and 5:00 p.m. Voicemails left after 4:30 p.m. will not be returned until the following business day. _______________________________________________________________  For prescription refill requests, have your pharmacy contact our office. _______________________________________________________________  Recommendations made by the consultant and any test results will be sent to your referring physician. _______________________________________________________________

## 2017-09-16 NOTE — Progress Notes (Signed)
Nutrition Follow-up:  Patient with lung cancer with new brain mets, enlarging right lower lobe mass and new pancreatic tail lesion.  Completed topotecan on 9/10 and radiation on 09/02/17.  Noted recent hospital admission for intractable nausea and vomiting (10/30-10/31).   Patient seen in infusion receiving IV fluids. Family at chairside.  Patient reports that she is feeling less nauseated.  Reports for 3 weeks during radiation she was unable to keep much food or liquid down due to nausea and vomiting.  Patient reports yesterday ate 2 pieces of toast, some potato soup, baked sweet potato, small butter finger and crowder peas and another vegetable.  Is not drinking boost shakes currently as reports dairy products have not been sitting well with me lately.  Reports this am ate 1/2 english muffin with cheese and canadian bacon on it.  Did not eat the egg because she does not like eggs.    No constipation, diarrhea reported today.   Medications: reports she is still taking decadron  Labs: reviewed  Anthropometrics:   Weight has decreased to 113 lb 12.8 oz on 11/1 from 130 lb 6.4 oz on 9/7   NUTRITION DIAGNOSIS: Inadequate oral intake continues   MALNUTRITION DIAGNOSIS: Patient meets criteria for severe malnutrition in acute illness likely progressing to chronic illness as evidenced by 13% weight loss in 2 months and eating < 50% of estimated energy needs for > or equal to 5 days.   INTERVENTION:   Discussed clear liquid oral nutrition supplements and provided samples for patient to try.  Discussed ways to increase calories and protein with these shakes. Also provided recipe for orange sherbet shake from [employer] for patient to try with increased calories and protein.  Encouraged patient to eat every 2-3 hours Encouraged patient to consume foods high in calories and protein.   Patient has contact information    MONITORING, EVALUATION, GOAL: weight trends, intake   NEXT VISIT: as  needed  Lydia Moore B. Zenia Resides, [place], [place] Registered Dietitian [phone] (pager)

## 2017-09-19 ENCOUNTER — Encounter (HOSPITAL_BASED_OUTPATIENT_CLINIC_OR_DEPARTMENT_OTHER): Payer: Managed Care, Other (non HMO)

## 2017-09-19 ENCOUNTER — Encounter (HOSPITAL_COMMUNITY): Payer: Self-pay

## 2017-09-19 VITALS — BP 149/95 | HR 100 | Temp 98.0°F | Resp 18

## 2017-09-19 DIAGNOSIS — C3431 Malignant neoplasm of lower lobe, right bronchus or lung: Secondary | ICD-10-CM | POA: Diagnosis not present

## 2017-09-19 DIAGNOSIS — C801 Malignant (primary) neoplasm, unspecified: Secondary | ICD-10-CM

## 2017-09-19 MED ORDER — SODIUM CHLORIDE 0.9% FLUSH
10.0000 mL | Freq: Once | INTRAVENOUS | Status: AC
  Administered 2017-09-19: 10 mL via INTRAVENOUS

## 2017-09-19 MED ORDER — HEPARIN SOD (PORK) LOCK FLUSH 100 UNIT/ML IV SOLN
500.0000 [IU] | Freq: Once | INTRAVENOUS | Status: AC
  Administered 2017-09-19: 500 [IU] via INTRAVENOUS
  Filled 2017-09-19: qty 5

## 2017-09-19 MED ORDER — SODIUM CHLORIDE 0.9 % IV SOLN
INTRAVENOUS | Status: DC
  Administered 2017-09-19: 10:00:00 via INTRAVENOUS

## 2017-09-19 NOTE — Progress Notes (Signed)
To treatment area for hydration.  Stated good weekend with no complaints voiced.    Patient tolerated hydration with no complaints voiced.  Port site clean and dry with no bruising or swelling noted at site.  Band aid applied.  VSS with discharge and left ambulatory.  No s/s of distress noted.

## 2017-09-19 NOTE — Patient Instructions (Signed)
[place] at [facility]  Discharge Instructions:  You received hydration today.  _______________________________________________________________  Thank you for choosing [place] at [facility] to provide your oncology and hematology care.  To afford each patient quality time with our providers, please arrive at least 15 minutes before your scheduled appointment.  You need to re-schedule your appointment if you arrive 10 or more minutes late.  We strive to give you quality time with our providers, and arriving late affects you and other patients whose appointments are after yours.  Also, if you no show three or more times for appointments you may be dismissed from the clinic.  Again, thank you for choosing [place] at [place] hope is that these requests will allow you access to exceptional care and in a timely manner. _______________________________________________________________  If you have questions after your visit, please contact our office at [phone] between the hours of 8:30 a.m. and 5:00 p.m. Voicemails left after 4:30 p.m. will not be returned until the following business day. _______________________________________________________________  For prescription refill requests, have your pharmacy contact our office. _______________________________________________________________  Recommendations made by the consultant and any test results will be sent to your referring physician. _______________________________________________________________

## 2017-09-21 ENCOUNTER — Encounter (HOSPITAL_COMMUNITY): Payer: Self-pay

## 2017-09-21 ENCOUNTER — Encounter (HOSPITAL_BASED_OUTPATIENT_CLINIC_OR_DEPARTMENT_OTHER): Payer: Managed Care, Other (non HMO)

## 2017-09-21 DIAGNOSIS — C7931 Secondary malignant neoplasm of brain: Secondary | ICD-10-CM | POA: Diagnosis not present

## 2017-09-21 DIAGNOSIS — C3431 Malignant neoplasm of lower lobe, right bronchus or lung: Secondary | ICD-10-CM

## 2017-09-21 MED ORDER — SODIUM CHLORIDE 0.9% FLUSH
10.0000 mL | INTRAVENOUS | Status: DC | PRN
  Administered 2017-09-21: 10 mL via INTRAVENOUS
  Filled 2017-09-21: qty 10

## 2017-09-21 MED ORDER — SODIUM CHLORIDE 0.9 % IV SOLN
INTRAVENOUS | Status: DC
  Administered 2017-09-21: 10:00:00 via INTRAVENOUS

## 2017-09-21 MED ORDER — HEPARIN SOD (PORK) LOCK FLUSH 100 UNIT/ML IV SOLN
500.0000 [IU] | Freq: Once | INTRAVENOUS | Status: AC
  Administered 2017-09-21: 500 [IU] via INTRAVENOUS
  Filled 2017-09-21: qty 5

## 2017-09-21 NOTE — Progress Notes (Signed)
Treatment given per orders. Patient tolerated it well without problems. Vitals stable and discharged home from clinic ambulatory. Follow up as scheduled.  

## 2017-09-21 NOTE — Patient Instructions (Signed)
[place] at [facility] Discharge Instructions  RECOMMENDATIONS MADE BY THE CONSULTANT AND ANY TEST RESULTS WILL BE SENT TO YOUR REFERRING PHYSICIAN.  IVF given today Follow up as scheduled.  Thank you for choosing [place] at [facility] to provide your oncology and hematology care.  To afford each patient quality time with our provider, please arrive at least 15 minutes before your scheduled appointment time.    If you have a lab appointment with the [facility] please come in thru the  Main Entrance and check in at the main information desk  You need to re-schedule your appointment should you arrive 10 or more minutes late.  We strive to give you quality time with our providers, and arriving late affects you and other patients whose appointments are after yours.  Also, if you no show three or more times for appointments you may be dismissed from the clinic at the providers discretion.     Again, thank you for choosing [facility].  Our hope is that these requests will decrease the amount of time that you wait before being seen by our physicians.       _____________________________________________________________  Should you have questions after your visit to [facility], please contact our office at [phone] between the hours of 8:30 a.m. and 4:30 p.m.  Voicemails left after 4:30 p.m. will not be returned until the following business day.  For prescription refill requests, have your pharmacy contact our office.       Resources For Cancer Patients and their Caregivers ? American Cancer Society: Can assist with transportation, wigs, general needs, runs Look Good Feel Better.        8385246885 ? Cancer Care: Provides financial assistance, online support groups, medication/co-pay assistance.  1-800-813-HOPE 754-194-3869) ? [place] Assists [place] cancer patients and their  families through emotional , educational and financial support.  9540417268 ? [place] DSS Where to apply for food stamps, Medicaid and utility assistance. 906-091-3050 ? [employer]: Transportation to medical appointments. 901-404-2903 ? Social Security Administration: May apply for disability if have a Stage IV cancer. 830-159-5964 (406)286-7435 ? [employer], Disability and Transit Services: Assists with nutrition, care and transit needs. [place] Support Programs: @10RELATIVEDAYS @ > Cancer Support Group  2nd Tuesday of the month 1pm-2pm, Journey Room  > Creative Journey  3rd Tuesday of the month 1130am-1pm, Journey Room  > Look Good Feel Better  1st Wednesday of the month 10am-12 noon, Journey Room (Call [place] to register [phone])

## 2017-09-23 ENCOUNTER — Encounter (HOSPITAL_COMMUNITY): Payer: Self-pay

## 2017-09-23 ENCOUNTER — Encounter (HOSPITAL_BASED_OUTPATIENT_CLINIC_OR_DEPARTMENT_OTHER): Payer: Managed Care, Other (non HMO)

## 2017-09-23 VITALS — BP 152/100 | HR 77 | Temp 98.3°F | Resp 18 | Wt 113.6 lb

## 2017-09-23 DIAGNOSIS — C3431 Malignant neoplasm of lower lobe, right bronchus or lung: Secondary | ICD-10-CM | POA: Diagnosis not present

## 2017-09-23 DIAGNOSIS — C801 Malignant (primary) neoplasm, unspecified: Secondary | ICD-10-CM

## 2017-09-23 DIAGNOSIS — C7931 Secondary malignant neoplasm of brain: Secondary | ICD-10-CM | POA: Diagnosis not present

## 2017-09-23 DIAGNOSIS — E876 Hypokalemia: Secondary | ICD-10-CM

## 2017-09-23 LAB — COMPREHENSIVE METABOLIC PANEL
ALK PHOS: 60 U/L (ref 38–126)
ALT: 25 U/L (ref 14–54)
AST: 26 U/L (ref 15–41)
Albumin: 4 g/dL (ref 3.5–5.0)
Anion gap: 10 (ref 5–15)
BILIRUBIN TOTAL: 0.6 mg/dL (ref 0.3–1.2)
BUN: 19 mg/dL (ref 6–20)
CALCIUM: 9.1 mg/dL (ref 8.9–10.3)
CO2: 27 mmol/L (ref 22–32)
CREATININE: 0.57 mg/dL (ref 0.44–1.00)
Chloride: 91 mmol/L — ABNORMAL LOW (ref 101–111)
Glucose, Bld: 122 mg/dL — ABNORMAL HIGH (ref 65–99)
Potassium: 2.2 mmol/L — CL (ref 3.5–5.1)
Sodium: 128 mmol/L — ABNORMAL LOW (ref 135–145)
TOTAL PROTEIN: 6.8 g/dL (ref 6.5–8.1)

## 2017-09-23 LAB — CBC WITH DIFFERENTIAL/PLATELET
BASOS PCT: 0 %
Basophils Absolute: 0 10*3/uL (ref 0.0–0.1)
EOS ABS: 0.1 10*3/uL (ref 0.0–0.7)
EOS PCT: 1 %
HCT: 35.5 % — ABNORMAL LOW (ref 36.0–46.0)
HEMOGLOBIN: 11.8 g/dL — AB (ref 12.0–15.0)
Lymphocytes Relative: 7 %
Lymphs Abs: 0.4 10*3/uL — ABNORMAL LOW (ref 0.7–4.0)
MCH: 30.3 pg (ref 26.0–34.0)
MCHC: 33.2 g/dL (ref 30.0–36.0)
MCV: 91 fL (ref 78.0–100.0)
Monocytes Absolute: 0.3 10*3/uL (ref 0.1–1.0)
Monocytes Relative: 5 %
NEUTROS PCT: 87 %
Neutro Abs: 5.3 10*3/uL (ref 1.7–7.7)
PLATELETS: 131 10*3/uL — AB (ref 150–400)
RBC: 3.9 MIL/uL (ref 3.87–5.11)
RDW: 18.5 % — ABNORMAL HIGH (ref 11.5–15.5)
WBC: 6.1 10*3/uL (ref 4.0–10.5)

## 2017-09-23 LAB — MAGNESIUM: MAGNESIUM: 1.4 mg/dL — AB (ref 1.7–2.4)

## 2017-09-23 MED ORDER — HEPARIN SOD (PORK) LOCK FLUSH 100 UNIT/ML IV SOLN
500.0000 [IU] | Freq: Once | INTRAVENOUS | Status: AC
  Administered 2017-09-23: 500 [IU] via INTRAVENOUS

## 2017-09-23 MED ORDER — POTASSIUM CHLORIDE 10 MEQ/100ML IV SOLN
10.0000 meq | INTRAVENOUS | Status: AC
  Administered 2017-09-23 (×4): 10 meq via INTRAVENOUS
  Filled 2017-09-23 (×4): qty 100

## 2017-09-23 MED ORDER — SODIUM CHLORIDE 0.9% FLUSH
10.0000 mL | INTRAVENOUS | Status: DC | PRN
Start: 2017-09-23 — End: 2017-09-23
  Administered 2017-09-23: 10 mL via INTRAVENOUS
  Filled 2017-09-23: qty 10

## 2017-09-23 MED ORDER — MAGNESIUM OXIDE 400 (241.3 MG) MG PO TABS: 400.0000 mg | ORAL_TABLET | Freq: Two times a day (BID) | ORAL | 1 refills | Status: DC

## 2017-09-23 MED ORDER — SODIUM CHLORIDE 0.9 % IV SOLN
INTRAVENOUS | Status: AC
  Administered 2017-09-23: 09:00:00 via INTRAVENOUS

## 2017-09-23 NOTE — Patient Instructions (Signed)
[place] at [facility] Discharge Instructions  RECOMMENDATIONS MADE BY THE CONSULTANT AND ANY TEST RESULTS WILL BE SENT TO YOUR REFERRING PHYSICIAN.  Received 2 hours of hydration today as well as Potassium IV. Follow-up as scheduled. Call clinic for any questions or concerns  Thank you for choosing [place] at [facility] to provide your oncology and hematology care.  To afford each patient quality time with our provider, please arrive at least 15 minutes before your scheduled appointment time.    If you have a lab appointment with the [facility] please come in thru the  Main Entrance and check in at the main information desk  You need to re-schedule your appointment should you arrive 10 or more minutes late.  We strive to give you quality time with our providers, and arriving late affects you and other patients whose appointments are after yours.  Also, if you no show three or more times for appointments you may be dismissed from the clinic at the providers discretion.     Again, thank you for choosing [facility].  Our hope is that these requests will decrease the amount of time that you wait before being seen by our physicians.       _____________________________________________________________  Should you have questions after your visit to [facility], please contact our office at [phone] between the hours of 8:30 a.m. and 4:30 p.m.  Voicemails left after 4:30 p.m. will not be returned until the following business day.  For prescription refill requests, have your pharmacy contact our office.       Resources For Cancer Patients and their Caregivers ? American Cancer Society: Can assist with transportation, wigs, general needs, runs Look Good Feel Better.        573-229-2620 ? Cancer Care: Provides financial assistance, online support groups, medication/co-pay assistance.  1-800-813-HOPE  7068489874) ? [place] Assists [place] cancer patients and their families through emotional , educational and financial support.  775 696 9394 ? [place] DSS Where to apply for food stamps, Medicaid and utility assistance. 859-624-5245 ? [employer]: Transportation to medical appointments. (207)694-3237 ? Social Security Administration: May apply for disability if have a Stage IV cancer. 321-436-1930 (339)840-6064 ? [employer], Disability and Transit Services: Assists with nutrition, care and transit needs. [place] Support Programs: @10RELATIVEDAYS @ > Cancer Support Group  2nd Tuesday of the month 1pm-2pm, Journey Room  > Creative Journey  3rd Tuesday of the month 1130am-1pm, Journey Room  > Look Good Feel Better  1st Wednesday of the month 10am-12 noon, Journey Room (Call [place] to register [phone])

## 2017-09-23 NOTE — Progress Notes (Signed)
CRITICAL VALUE ALERT Critical value received:  K+ 2.2 Date of notification:  09/23/17 Time of notification: 0034 Critical value read back:  Yes.   Nurse who received alert:  M.Kasidy Gianino, LPN MD notified (1st page):  Virgina Jock, MD

## 2017-09-23 NOTE — Progress Notes (Signed)
[phone] Labs, including Potassium of 2.2, reviewed with Dr. Oliva Bustard and orders obtained for 4 runs of Potassium 10 meq IV each over 1 hour each for today and add Magnesium level to labs drawn earlier this morning per Dr. Nicole Cella [place] tolerated hydration and Potassium infusions well without complaints or incident. Magnesium level of 1.4 reviewed with Mike Craze NP and Mag PO ordered 400 mg BID then recheck labs on Monday per NP. Reviewed this information with pt who verbalized understanding. VSS upon discharge. Pt discharged self ambulatory in satisfactory condition

## 2017-09-26 ENCOUNTER — Other Ambulatory Visit: Payer: Self-pay

## 2017-09-26 ENCOUNTER — Encounter (HOSPITAL_COMMUNITY): Payer: Managed Care, Other (non HMO)

## 2017-09-26 ENCOUNTER — Encounter (HOSPITAL_BASED_OUTPATIENT_CLINIC_OR_DEPARTMENT_OTHER): Payer: Managed Care, Other (non HMO) | Admitting: Oncology

## 2017-09-26 ENCOUNTER — Encounter (HOSPITAL_COMMUNITY): Payer: Self-pay | Admitting: Oncology

## 2017-09-26 VITALS — BP 139/87 | HR 99 | Resp 16 | Ht 65.0 in | Wt 115.0 lb

## 2017-09-26 DIAGNOSIS — E222 Syndrome of inappropriate secretion of antidiuretic hormone: Secondary | ICD-10-CM | POA: Diagnosis not present

## 2017-09-26 DIAGNOSIS — C801 Malignant (primary) neoplasm, unspecified: Secondary | ICD-10-CM

## 2017-09-26 DIAGNOSIS — C7931 Secondary malignant neoplasm of brain: Secondary | ICD-10-CM | POA: Diagnosis not present

## 2017-09-26 DIAGNOSIS — E876 Hypokalemia: Secondary | ICD-10-CM | POA: Diagnosis not present

## 2017-09-26 DIAGNOSIS — C3431 Malignant neoplasm of lower lobe, right bronchus or lung: Secondary | ICD-10-CM

## 2017-09-26 DIAGNOSIS — E871 Hypo-osmolality and hyponatremia: Secondary | ICD-10-CM

## 2017-09-26 LAB — COMPREHENSIVE METABOLIC PANEL
ALBUMIN: 4.1 g/dL (ref 3.5–5.0)
ALT: 24 U/L (ref 14–54)
AST: 22 U/L (ref 15–41)
Alkaline Phosphatase: 58 U/L (ref 38–126)
Anion gap: 9 (ref 5–15)
BUN: 29 mg/dL — ABNORMAL HIGH (ref 6–20)
CHLORIDE: 93 mmol/L — AB (ref 101–111)
CO2: 29 mmol/L (ref 22–32)
Calcium: 9.4 mg/dL (ref 8.9–10.3)
Creatinine, Ser: 0.65 mg/dL (ref 0.44–1.00)
GFR calc Af Amer: >60 mL/min (ref >60)
GFR calc non Af Amer: >60 mL/min (ref >60)
GLUCOSE: 127 mg/dL — AB (ref 65–99)
POTASSIUM: 2.7 mmol/L — AB (ref 3.5–5.1)
SODIUM: 131 mmol/L — AB (ref 135–145)
Total Bilirubin: 0.6 mg/dL (ref 0.3–1.2)
Total Protein: 6.8 g/dL (ref 6.5–8.1)

## 2017-09-26 LAB — CBC WITH DIFFERENTIAL/PLATELET
BASOS ABS: 0 10*3/uL (ref 0.0–0.1)
BASOS PCT: 0 %
EOS ABS: 0.1 10*3/uL (ref 0.0–0.7)
Eosinophils Relative: 1 %
HEMATOCRIT: 35.6 % — AB (ref 36.0–46.0)
Hemoglobin: 11.6 g/dL — ABNORMAL LOW (ref 12.0–15.0)
Lymphocytes Relative: 5 %
Lymphs Abs: 0.3 10*3/uL — ABNORMAL LOW (ref 0.7–4.0)
MCH: 30 pg (ref 26.0–34.0)
MCHC: 32.6 g/dL (ref 30.0–36.0)
MCV: 92 fL (ref 78.0–100.0)
MONO ABS: 0.3 10*3/uL (ref 0.1–1.0)
Monocytes Relative: 5 %
NEUTROS ABS: 5 10*3/uL (ref 1.7–7.7)
Neutrophils Relative %: 89 %
PLATELETS: 114 10*3/uL — AB (ref 150–400)
RBC: 3.87 MIL/uL (ref 3.87–5.11)
RDW: 18.4 % — AB (ref 11.5–15.5)
WBC: 5.6 10*3/uL (ref 4.0–10.5)

## 2017-09-26 NOTE — Progress Notes (Signed)
Practice, [facility] [place] [address]  Small cell carcinoma [facility]) - Plan: CBC with Differential, Comprehensive metabolic panel, CBC with Differential [facility]), Comprehensive metabolic panel, CBC with Differential, Comprehensive metabolic panel, MR Brain W Wo Contrast  CURRENT THERAPY: Salvage Topotecan therapy beginning on 02/28/2017.  INTERVAL HISTORY: Lydia Moore 56 y.o. female returns for followup of small cell lung cancer right lung, limited stage, at time of diagnosis, presenting with SIADH.  S/P 6 cycles of cisplatin/etoposide (07/27/2016-11/11/2016) curative intent.  She was evaluated by radiation oncology for involved field XRT during chemotherapy, but due to tumor burden, she was deemed not an XRT candidate.  She refused whole brain XRT in the prophylactic setting following systemic chemotherapy.  Short interval relapse of disease less than 6 months on restaging CT scans on 02/21/2017 leading to salvage treatment with Topotecan for recurrent rash extensive stage small cell lung cancer.  S/P second opinion at [facility] with Dr. Lissa Morales on 03/10/2017.    Small cell carcinoma ([place])   07/12/2016 Imaging    CTA chest 3.9 x 3.5 cm right lower lobe mass with imaging features most compatible with a primary lung carcinoma.Marked metastatic right hilar and mediastinal adenopathy including a confluent mass of adenopathy in the right hilum, encasing and causing marked narrowing of the central pulmonary arteries on the right with a short segment of occlusion of the right lower lobe bronchus.Mild postobstructive changes in the inferior, medial right lower lobe.Mild changes of COPD       07/16/2016 Procedure    Endoscopic bronchoscopy with transbronchial biopsy of #7 nodes and biopsy of RLL lung mass      07/16/2016 Pathology Results    Lung, biopsy, Right Lower Lobe - SMALL CELL CARCINOMA.       07/21/2016 Imaging    MRI brain Negative MRI of the brain.  No evidence for metastatic disease to the brain or meninges.      07/26/2016 PET scan    Markedly hypermetabolic right lower lobe lesion with hypermetabolic metastatic disease in the right hilum and mediastinum. 2. Several scattered hypermetabolic ground-glass opacities in the right upper lobe. FDG accumulation within these nodules is higher than typically seen for infectious/inflammatory etiology although this remains within the differential. Atypical appearance of metastatic spread would also be a consideration. 3. Coronary artery atherosclerosis. 4. **An incidental finding of potential clinical significance has been found. 3.8 cm abdominal aortic aneurysm       07/27/2016 - 11/11/2016 Chemotherapy    Cisplatin/Etoposide x 6 cycles      11/09/2016 Treatment Plan Change    Patient declined/refused whole brain radiation.      11/23/2016 Imaging    CT chest with No new or progressive findings in the chest. 2. Further interval decrease in size of mediastinal lymphadenopathy. 3. Only trace scarring visible in the right lower lobe, at the location of the previous right lower lobe mass. 4. Coronary artery and thoracoabdominal aortic atherosclerosis      02/21/2017 Imaging    CT CAP- 1. Unfortunately the patient has had significant recurrence with marked mediastinal and right infrahilar adenopathy as well as suspected masses in the collapsed right lower lobe. The confluent adenopathy completely occlude the right lower lobe and right middle lobe bronchi, although there is some air trapping in the right middle lobe ; the right lower lobe is completely collapsed around several right lower lobe masses. 2. There is some speckled density in the left  anterior sixth rib which is not entirely specific but which is concerning for early osseous metastatic disease given that it was not present on 11/23/2016. Consider a bone scan to further assess the skeleton for other occult bony spread. 3.  Infrarenal abdominal aortic aneurysm 4.2 cm in diameter, increased from the prior measurement of 3.8 cm on 07/26/2016. Although this is only minor increase, it has occurred in a 7 month period. The patient has a previous existing relationship of cardiothoracic surgery and surveillance of this aneurysm by surgery is appropriate. 4. Other imaging findings of potential clinical significance: Coronary, aortic arch, and branch vessel atherosclerotic vascular disease. Mild lumbar spondylosis and degenerative disc disease.      02/22/2017 Relapse/Recurrence    Last Treatment Date: 11/11/2016 Recent Lab Values: Recent Lab Values: N/A      02/28/2017 -  Chemotherapy    Topotecan       03/08/2017 Imaging    MRI brain- 1. No evidence of metastatic disease. 2. Intermittently motion degraded.      03/08/2017 Imaging    Bone scan: IMPRESSION: No evidence skeletal metastasis by bone scintigraphy.      03/10/2017 Miscellaneous    Consult at [facility] with Dr. Aniceto Boss:  Following Topotecan therapy, she may be a candidate for immunotherapy at time of progression/relapse, with Nivolumab versus Nivolumab/Ipilumumab.  Response rates with single-agent immunotherapy is ~ 10% and with combo therapy 20-30%.  Responses are durable.  Rate of side effects from therapy are ~ 5% and 20-30%.  This therapy is not yet FDA approved and therefore if this therapy option is approved, prior approval from insurance company will be needed.  Dr. Aniceto Boss favors the combination therapy.  Additionally, she may be eligible for a clinical trial (ie Rova-T + nivolumab/ipilumumab).      04/07/2017 Imaging    CT CAP- 1. Persistent but improved bulky mediastinal and right hilar/infrahilar lymphadenopathy and significant decrease in size of the right lower lobe mass and obstructive pneumonitis. Residual branching endobronchial tumor in the right lower lobe. 2. No findings for pulmonary metastatic nodules or  abdominal/pelvic lymphadenopathy. 3. Stable infrarenal abdominal aortic aneurysm.      04/12/2017 Treatment Plan Change    Tx deferred x 1 week due to hyponatremia requiring work-up.      08/19/2017 Imaging    MRI Brain w/ and w/o contrast: IMPRESSION: New 4 x 8 mm metastasis at the medial left posterior frontal lobe. Three other questionable small metastases, 1 at the posterolateral inferior left parietal lobe, 1 in the right posterior parietal white matter, and 1 in the left frontal region. These cannot be confirmed on multiple sequences. Therefore, it may be worthwhile obtaining a 3 tesla examination, particularly if stereotactic radiotherapy is considered.      08/19/2017 Imaging    CT C/A/P: IMPRESSION: 1. Right lower lobe lung mass is increased since 08/04/2017 and significantly increased since 04/07/2017, with new occlusion of the right lower lobe bronchus and complete right lower lobe atelectasis. 2. Bulky ipsilateral hilar and right paratracheal and subcarinal mediastinal adenopathy, stable since 08/04/2017 and significantly increased since 04/07/2017. 3. New hypodense pancreatic tail mass, most compatible with pancreatic metastasis . 4. No additional sites of metastatic disease in the chest, abdomen or pelvis. 5. New trace dependent right pleural effusion. Increased interlobular septal thickening in the right middle lobe, favor localized pulmonary edema, cannot exclude lymphangitic tumor spread. 6. Small pericardial effusion, stable since 08/04/2017, new since 04/07/2017 . 7. Three-vessel coronary atherosclerosis. 8. Aortic Atherosclerosis (ICD10-I70.0). Mildly  increased 4.4 cm Abdominal Aortic Aneurysm (ICD10-I71.9). Recommend follow-up aortic ultrasound in 1 year. This recommendation follows ACR consensus guidelines: White Paper of the ACR Incidental Findings Committee II on Vascular Findings. J Am Coll Radiol 2013; 10:789-794. 9. Mild colonic  diverticulosis.       08/19/2017 Progression    New brain mets, enlarging right lower lobe mass, bulky chest lymphadenopathy, new pancreatic tail lesion      08/29/2017 - 09/09/2017 Radiation Therapy    Under the care of Dr. Lisbeth Renshaw   Radiation treatment dates:   08/29/2017 - 09/09/2017  Site/dose:   The brain and right lung were each treated to 30 Gy in 10 fractions of 3 Gy.         Patient presented for follow up with her daughter. She states that she is feeling a lot better since she was admitted for hydration and received fluid 3 x a week last week for her intractable nausea/vomiting. She has not had any nausea symptoms for over a week now. She states her appetite is good and she has gained 2 lbs. She denies any worsening headache, dizziness, breathing issues, abdominal pain, chest pain. Overall she feels a lot better.   Review of Systems  Constitutional: Negative for chills, fever, malaise/fatigue and weight loss.  HENT: Negative.  Negative for hearing loss, sore throat and tinnitus.   Eyes: Negative.  Negative for blurred vision, photophobia and discharge.  Respiratory: Positive for shortness of breath (chronic, stable). Negative for cough, hemoptysis and wheezing.   Cardiovascular: Negative.  Negative for chest pain, palpitations, orthopnea, claudication and leg swelling.  Gastrointestinal: Negative for abdominal pain, blood in stool, constipation, diarrhea, melena, nausea and vomiting.  Genitourinary: Negative.  Negative for dysuria and hematuria.  Musculoskeletal: Negative.  Negative for back pain, joint pain and myalgias.  Skin: Negative.  Negative for itching and rash.  Neurological: Positive for tingling (neuropathy in hands and feet-chronic). Negative for dizziness, tremors, sensory change, speech change, focal weakness, seizures, loss of consciousness and weakness.  Endo/Heme/Allergies: Negative.  Negative for environmental allergies and polydipsia. Does not bruise/bleed  easily.  Psychiatric/Behavioral: Negative.  Negative for depression. The patient is not nervous/anxious and does not have insomnia.     Past Medical History:  Diagnosis Date  . Elevated cholesterol   . GERD (gastroesophageal reflux disease)   . GI bleed   . Goals of care, counseling/discussion 03/21/2017  . Hypertension   . 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 Small cell carcinoma ([place]) 07/21/2016    Past Surgical History:  Procedure Laterality Date  . ABDOMINAL HYSTERECTOMY     partial  . CESAREAN SECTION    . COLONOSCOPY    . TONSILLECTOMY      Family History  Problem Relation Age of Onset  . Heart disease Mother   . Heart disease Father   . COPD Sister   . HIV Brother     Social History   Socioeconomic History  . Marital status: Married    Spouse name: None  . Number of children: 4  . Years of education: None  . Highest education level: None  Social Needs  . Financial resource strain: None  . Food insecurity - worry: None  . Food insecurity - inability: None  . Transportation needs - medical: None  . Transportation needs - non-medical: None  Occupational History  . Occupation: [employer]    Employer: [employer]  Tobacco Use  . Smoking status: Current Every Day Smoker  Packs/day: 0.50    Years: 35.00    Pack years: 17.50    Types: Cigarettes  . Smokeless tobacco: Never Used  Substance and Sexual Activity  . Alcohol use: Yes    Comment: occasionally, Couple drinks per mo  . Drug use: Yes    Frequency: 1.0 times per week    Types: Marijuana    Comment: weekly  . Sexual activity: Yes    Birth control/protection: Surgical    Comment: married  Other Topics Concern  . None  Social History Narrative   Lives w/ husband     PHYSICAL EXAMINATION  ECOG PERFORMANCE STATUS: 0 - Asymptomatic  Vitals:   09/26/17 1403  BP: 139/87  Pulse: 99  Resp: 16  SpO2: 100%      Physical Exam  Constitutional: She is oriented to person,  place, and time and well-developed, well-nourished, and in no distress. No distress.  HENT:  Head: Normocephalic and atraumatic.  Mouth/Throat: No oropharyngeal exudate.  Eyes: Conjunctivae are normal. Pupils are equal, round, and reactive to light. No scleral icterus.  Neck: Normal range of motion. Neck supple. No JVD present.  Cardiovascular: Normal rate, regular rhythm and normal heart sounds. Exam reveals no gallop and no friction rub.  No murmur heard. Pulmonary/Chest: Breath sounds normal. No respiratory distress. She has no wheezes. She has no rales.  Abdominal: Soft. Bowel sounds are normal. She exhibits no distension. There is no tenderness. There is no guarding.  Musculoskeletal: She exhibits no edema or tenderness.  Lymphadenopathy:    She has no cervical adenopathy.  Neurological: She is alert and oriented to person, place, and time. No cranial nerve deficit.  Skin: Skin is warm and dry. No rash noted. No erythema. No pallor.  Psychiatric: Affect and judgment normal.     LABORATORY DATA: CBC    Component Value Date/Time   WBC 5.6 09/26/2017 1300   RBC 3.87 09/26/2017 1300   HGB 11.6 (L) 09/26/2017 1300   HCT 35.6 (L) 09/26/2017 1300   PLT 114 (L) 09/26/2017 1300   MCV 92.0 09/26/2017 1300   MCH 30.0 09/26/2017 1300   MCHC 32.6 09/26/2017 1300   RDW 18.4 (H) 09/26/2017 1300   LYMPHSABS 0.3 (L) 09/26/2017 1300   MONOABS 0.3 09/26/2017 1300   EOSABS 0.1 09/26/2017 1300   BASOSABS 0.0 09/26/2017 1300      Chemistry      Component Value Date/Time   NA 131 (L) 09/26/2017 1300   NA 133 (L) 09/05/2017 1513   K 2.7 (LL) 09/26/2017 1300   K 4.1 09/05/2017 1513   CL 93 (L) 09/26/2017 1300   CO2 29 09/26/2017 1300   CO2 26 09/05/2017 1513   BUN 29 (H) 09/26/2017 1300   BUN 19.0 09/05/2017 1513   CREATININE 0.65 09/26/2017 1300   CREATININE 0.9 09/05/2017 1513      Component Value Date/Time   CALCIUM 9.4 09/26/2017 1300   CALCIUM 10.1 09/05/2017 1513   ALKPHOS  58 09/26/2017 1300   ALKPHOS 77 09/05/2017 1513   AST 22 09/26/2017 1300   AST 22 09/05/2017 1513   ALT 24 09/26/2017 1300   ALT 18 09/05/2017 1513   BILITOT 0.6 09/26/2017 1300   BILITOT 0.88 09/05/2017 1513        ASSESSMENT:  1. 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  S/P 6 cycles of Cisplatin/Etoposide (07/27/2016- 11/11/2016) with curative intent.  She was evaluated by radiation oncology for  involved field XRT during chemotherapy, but due to tumor burden, she was deemed not an XRT candidate.  She refused whole brain XRT in the prophylactic setting following systemic chemotherapy.  Short interval relapse of disease <6 months on restaging CT imaging on 02/21/2017 leading to salvage treatment with Topotecan for recurrent/extensive stage SCLC.  S/P second opinion at [facility] with Dr. Lissa Morales on 03/10/2017. 08/2017 new brain mets, treated with RT.   -She has evidence of progressive disease with new small brain mets, enlarging right lower lobe mass causing complete obstruction of her right lower lobe bronchus and complete atelecstasis, bulky chest lymphadenopathy, new pancreatic tail lesion.  -As per her previous [facility] recommendations, I have discussed starting treatment with nivolumab/ipilimumab q21 days. Patient is feeling well enough to resume treatment now. Cycle 1 planned for 09/30/17. Will plan to repeat her restaging scans CT C/A/P after cycle 2 of treatment. -Will order her MRI brain for follow up of her brain mets to be done on 10/14/17. Follow up with Dr Lisbeth Renshaw as scheduled on 10/17/17.  2. 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 Treatment of this is to treat the underlying disease. - Stable. Last sodium on today was 131. - Advised patient to continue her sodium tablets PO TID. - I have advised patient to fluid restrict to less than a liter of fluids daily. - Encouraged  patient to increase salt intake. - Potentially can add a loop diuretic with lasix 20mg  PO BID if worsening sodium level on fluid restriction and sodium tablets.    3. Hypokalemia -K is 2.7 today. Patient has KCL 70meq tabs at home. I have advised her to take kcl 67meq PO q4 hours x 3 doses.  RTC for follow up on  All questions were answered. The patient knows to call the clinic with any problems, questions or concerns. We can certainly see the patient much sooner if necessary.   This note is electronically signed by: Twana First, MD 09/26/2017 2:25 PM

## 2017-09-26 NOTE — Progress Notes (Signed)
CRITICAL VALUE STICKER  CRITICAL VALUE: Potassium 2.7 DATE & TIME NOTIFIED: 11/12 @1340  MD NOTIFIED: Dr. Twana First RESPONSE: provider in with Mrs. Firestine at this time. Will await orders.

## 2017-09-27 NOTE — Patient Instructions (Signed)
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  You have had some progression therefore we are going to treat you with an immunotherapy called opdivo and yervoy.  They are given every 3 weeks or every 21 days.  There are no premedications given prior to administering this medication.   This treatment is given with palliative intent, which mean that you are treatable but not curable.  You will stay on this treatment until you have progression or you can no longer tolerate the drug.   You will see the doctor regularly throughout treatment.  We monitor your lab work prior to every treatment.  The doctor monitors your response to treatment by the way you are feeling, your blood work, and scans periodically.   POTENTIAL SIDE EFFECTS OF TREATMENT:  Nivolumab (Opdivo)  About This Drug Nivolumab is used to treat cancer. It is given in the vein (IV).  Possible Side Effects . Bone marrow depression. This is a decrease in the number of white blood cells, red blood cells, and platelets. This may raise your risk of infection, make you tired and weak (fatigue), and raise your risk of bleeding. . Tiredness and weakness . Joint, muscle and bone pain . Back pain . Loose bowel movements (diarrhea) . Nausea . Decreased appetite (decreased hunger) . Constipation (not able to move bowels) . Cough and trouble breathing . Upper respiratory infection . Fever . Rash and itching . Electrolyte changes Note: Each of the side effects above was reported in 20% or greater of patients treated with nivolumab. Not all possible side effects are included above. Your side effects may be different or more severe if you receive nivolumab in combination with other chemotherapy agents.  Warnings and Precautions . This drug works with your immune system and can cause inflammation in any of your organs and tissues and can change how they work. This may  put you at risk for developing serious medical problems which can very rarely be fatal. . Colitis (swelling (inflammation) in the colon) - symptoms are loose bowel movements (diarrhea) stomach cramping, and sometimes blood in the bowel movements . Changes in liver function . Changes in kidney function . Inflammation (swelling) of the lungs which can very rarely be fatal - you may have a dry cough or trouble breathing. . This drug may affect some of your hormone glands (especially the thyroid, adrenals, pituitary and pancreas). . Blood sugar levels may change and you may develop diabetes. If you already have diabetes, changes may need to be made to your diabetes medication. . Severe allergic skin reaction which can very rarely be fatal. You may develop blisters on your skin that are filled with fluid or a severe red rash all over your body that may be painful. . Changes in your central nervous system can happen. The central nervous system is made up of your brain and spinal cord. You could feel extreme tiredness, agitation, confusion, hallucinations (see or hear things that are not there), trouble understanding or speaking, loss of control of your bowels or bladder, eyesight changes, numbness or lack of strength to your arms, legs, face, or body, and coma. If you start to have any of these symptoms let your doctor know right away. . While you are getting this drug in your vein (IV), you may have a reaction to the drug. Sometimes you may be given medication to stop or lessen these side effects. Your nurse will  check you closely for these signs: fever or shaking chills, flushing, facial swelling, feeling dizzy, headache, trouble breathing, rash, itching, chest tightness, or chest pain. These reactions may happen after your infusion. If this happens, call 911 for emergency care. . Increased risk of complications, which may very rarely be fatal, in patients who will undergo a stem cell transplant after  receiving nivolumab.  Important Information . This drug may be present in the saliva, tears, sweat, urine, stool, vomit, semen, and vaginal secretions. Talk to your doctor and/or your nurse about the necessary precautions to take during this time.  Treating Side Effects . To decrease infection, wash your hands regularly . Avoid close contact with people who have a cold, the flu, or other infections. . Take your temperature as your doctor or nurse tells you, and whenever you feel like you may have a fever . To help decrease bleeding, use a soft toothbrush. Check with your nurse before using dental floss. . Be very careful when using knives or tools . Use an electric shaver instead of a razor . Ask your doctor or nurse about medicines that are available to help stop or lessen constipation, diarrhea and/or nausea. . Drink plenty of fluids (a minimum of eight glasses per day is recommended). . If you are not able to move your bowels, check with your doctor or nurse before you use any enemas, laxatives, or suppositories . To help with nausea and vomiting, eat small, frequent meals instead of three large meals a day. Choose foods and drinks that are at room temperature. Ask your nurse or doctor about other helpful tips and medicine that is available to help or stop lessen these symptoms. . If you get diarrhea, eat low-fiber foods that are high in protein and calories and avoid foods that can irritate your digestive tracts or lead to cramping. Ask your nurse or doctor about medicine that can lessen or stop your diarrhea. . To help with decreased appetite, eat small, frequent meals . Eat high caloric food such as pudding, ice cream, yogurt and milkshakes. . Manage tiredness by pacing your activities for the day. Be sure to include periods of rest between energy-draining activities . Keeping your pain under control is important to your wellbeing. Please tell your doctor or nurse if you are experiencing  pain. . If you have diabetes, keep good control of your blood sugar level. Tell your nurse or your doctor if your glucose levels are higher or lower than normal . If you get a rash do not put anything on it unless your doctor or nurse says you may. Keep the area around the rash clean and dry. Ask your doctor for medicine if your rash bothers you. . Infusion reactions may occur after your infusion. If this happens, call 911 for emergency care.  Food and Drug Interactions . There are no known interactions of nivolumab with food or other medications. . Tell your doctor and pharmacist about all the medicines and dietary supplements (vitamins, minerals, herbs and others) that you are taking at this time. The safety and use of dietary supplements and alternative diets are often not known. Using these might affect your cancer or interfere with your treatment. Until more is known, you should not use dietary supplements or alternative diets without your cancer doctor's help.  When to Call the Doctor Call your doctor or nurse if you have any of these symptoms and/or any new or unusual symptoms: . Fever of 100.5 F (38 C) or  higher . Chills . Easy bleeding or bruising . Wheezing or trouble breathing . Dry cough, or cough with yellow, green or bloody mucus . Confusion and/or agitation . Hallucinations . Trouble understanding or speaking . Blurry vision or changes in your eyesight . Numbness or lack of strength to your arms, legs, face, or body . Feeling dizzy or lightheaded . Loose bowel movements (diarrhea) more than 4 times a day or diarrhea with weakness or lightheadedness . Nausea that stops you from eating or drinking, and/or that is not relieved by prescribed medicines . Lasting loss of appetite or rapid weight loss of five pounds in a week . No bowel movement for 3 days or you feel uncomfortable . Bad abdominal pain, especially in upper right area . Fatigue or extreme weakness that interferes  with normal activities . Decreased urine . Unusual thirst or passing urine often . Rash that is not relieved by prescribed medicines . Rash or itching . Flu-like symptoms: fever, headache, muscle and joint aches, and fatigue (low energy, feeling weak) . Signs of liver problems: dark urine, pale bowel movements, bad stomach pain, feeling very tired and weak, unusual itching, or yellowing of the eyes or skin. . Signs of infusion reaction: fever or shaking chills, flushing, facial swelling, feeling dizzy, headache, trouble breathing, rash, itching, chest tightness, or chest pain. . If you think you may be pregnant  Reproduction Warnings . Pregnancy warning: This drug can have harmful effects on the unborn baby. Women of child bearing potential should use effective methods of birth control during your cancer treatment and for at least 5 months after treatment. Let your doctor know right away if you think you may be pregnant. . Breastfeeding warning: It is not known if this drug passes into breast milk. For this reason, women should not breast feed during treatment because this drug could enter the breast milk and cause harm to a breast feeding baby. . Fertility warning: Human fertility studies have not been done with this drug. Talk with your doctor or nurse if you plan to have children. Ask for information on sperm or egg banking.     Ipilimumab Curt Bears)  About This Drug Ipilimumab is used to treat cancer. It is given in the vein (IV).  Possible Side Effects . Tiredness . Loose bowel movements (diarrhea) . Colitis. This is swelling (inflammation) in the colon - symptoms are loose bowel movements (diarrhea) stomach cramping, and sometimes blood in the bowel movements . Nausea and throwing up (vomiting) . Decreased appetite (decreased hunger) . Weight loss . Headache . Fever . Trouble sleeping . Rash and itching Note: Each of the side effects above was reported in 5% or greater of  patients treated with ipilimumab. Not all possible side effects are included above.  Warnings and Precautions . This drug works with your immune system and can cause inflammation in any of your organs and tissues and can change how they work. This may put you at risk for developing serious medical problems which can very rarely be fatal. . Severe allergic skin reaction which can very rarely be fatal. You may develop blisters on your skin that are filled with fluid or a severe red rash all over your body that may be painful. . Severe colitis which can very rarely be fatal. This is swelling (inflammation) in the colon. . Changes in your liver function which can very rarely cause liver failure and be fatal . This drug may affect some of your hormone glands (especially  the thyroid, adrenals, pituitary and pancreas). . Inflammation of your nerves. You may feel numbness, tingling, or pain in your hands and feet. It may be hard for you to button your clothes, open jars, or walk as usual. The effect on the nerves may get worse with more doses of the drug. These effects get better in some people after the drug is stopped but it does not get better in all people. Very rarely, this can affect the nerves and muscles in your upper and lower body and cause paralysis. . Inflammation of your eye and/or other changes in eyesight  Important Information . This drug may be present in the saliva, tears, sweat, urine, stool, vomit, semen, and vaginal secretions. Talk to your doctor and/or your nurse about the necessary precautions to take during this time.  Treating Side Effects . Drink plenty of fluids (a minimum of eight glasses per day is recommended) . To help with decreased appetite, eat small, frequent meals . Eat high caloric food such as pudding, ice cream, yogurt and milkshakes. . To help with weight loss, drink fluids that contribute calories (whole milk, juice, soft drinks, sweetened beverages,  milkshakes, and nutritional supplements) instead of water. . Include a source of protein at every meal and snack, such as meat, poultry, fish, dry beans, tofu, eggs, nuts, milk, yogurt, cheese, ice cream, pudding, and nutritional supplements. . If you throw up or have loose bowel movements, you should drink more fluids so that you do not become dehydrated (lack water in the body from losing too much fluid). . To help with nausea and vomiting, eat small, frequent meals instead of three large meals a day. Choose foods and drinks that are at room temperature. Ask your nurse or doctor about other helpful tips and medicine that is available to help or stop lessen these symptoms. . If you get diarrhea, eat low-fiber foods that are high in protein and calories and avoid foods that can irritate your digestive tracts or lead to cramping. . Ask your nurse or doctor about medicine that can lessen or stop your diarrhea. Marland Kitchen Keeping your pain under control is important to your well-being. Please tell your doctor or nurse if you are experiencing pain. . If you get a rash do not put anything on it unless your doctor or nurse says you may. Keep the area around the rash clean and dry. Ask your doctor for medicine if your rash bothers you . If you are having trouble sleeping, talk to your nurse or doctor on tips to help you sleep better . If you have numbness and tingling in your hands and feet, be careful when cooking, walking, and handling sharp objects and hot liquids.  Food and Drug Interactions . There are no known interactions of ipilimumab with food or other medications. . Tell your doctor and pharmacist about all the medicines and dietary supplements (vitamins, minerals, herbs and others) that you are taking at this time. The safety and use of dietary supplements and alternative diets are often not known. Using these might affect your cancer or interfere with your treatment. Until more is known, you  should not use dietary supplements or alternative diets without your cancer doctor's help.  When to Call the Doctor Call your doctor or nurse if you have any of these symptoms and/or any new or unusual symptoms: . Fever of 100.5 F (38 C) or higher . Chills . Flu-like symptoms: fever, headache, muscle and joint aches, and fatigue (low energy, feeling  weak) . Lasting loss of appetite or rapid weight loss of five pounds in a week . Nausea that stops you from eating or drinking and/or is not relieved by prescribed medicines . Throwing up more than 3 times a day . Loose bowel movements (diarrhea) 4 times or loose bowel movements with lack of strength or a feeling of being dizzy . Pain in your abdomen that does not go away . Blood in your stool . Headache that does not go away . Blurred vision or other changes in eyesight . Signs of possible liver problems: dark urine, pale bowel movements, bad stomach pain, feeling very tired and weak, unusual itching, or yellowing of the eyes or skin . A new rash or a rash that is not relieved by prescribed medicines . Numbness, tingling, or pain your hands and feet . If you think you may be pregnant  Reproduction Warnings . Pregnancy warning: This drug can have harmful effects on the unborn baby. Women of child bearing potential should use effective methods of birth control during your cancer treatment and for at least 3 months after treatment. Let your doctor know right away if you think you may be pregnant . Breastfeeding warning: It is not known if this drug passes into breast milk. For this reason, women should not breast feed during treatment and for 3 months after treatment because this drug could enter the breast milk and cause harm to a breast feeding baby. . Fertility warning: Human fertility studies have not been done with this drug. Talk with your doctor or nurse if you plan to have children. Ask for information on sperm or egg  banking.    SELF CARE ACTIVITIES WHILE ON CHEMOTHERAPY: Hydration Increase your fluid intake 48 hours prior to treatment and drink at least 8 to 12 cups (64 ounces) of water/decaff beverages per day after treatment. You can still have your cup of coffee or soda but these beverages do not count as part of your 8 to 12 cups that you need to drink daily. No alcohol intake.  Medications Continue taking your normal prescription medication as prescribed.  If you start any new herbal or new supplements please let us know first to make sure it is safe.  Mouth Care Have teeth cleaned professionally before starting treatment. Keep dentures and partial plates clean. Use soft toothbrush and do not use mouthwashes that contain alcohol. Biotene is a good mouthwash that is available at most pharmacies or may be ordered by calling [phone]. Use warm salt water gargles (1 teaspoon salt per 1 quart warm water) before and after meals and at bedtime. Or you may rinse with 2 tablespoons of three-percent hydrogen peroxide mixed in eight ounces of water. If you are still having problems with your mouth or sores in your mouth please call the clinic. If you need dental work, please let Dr. Whitney Muse know before you go for your appointment so that we can coordinate the best possible time for you in regards to your chemo regimen. You need to also let your dentist know that you are actively taking chemo. We may need to do labs prior to your dental appointment.   Skin Care Always use sunscreen that has not expired and with SPF (Sun Protection Factor) of 50 or higher. Wear hats to protect your head from the sun. Remember to use sunscreen on your hands, ears, face, & feet.  Use good moisturizing lotions such as udder cream, eucerin, or even Vaseline. Some chemotherapies can  cause dry skin, color changes in your skin and nails.    . Avoid long, hot showers or baths. . Use gentle, fragrance-free soaps and laundry  detergent. . Use moisturizers, preferably creams or ointments rather than lotions because the thicker consistency is better at preventing skin dehydration. Apply the cream or ointment within 15 minutes of showering. Reapply moisturizer at night, and moisturize your hands every time after you wash them.  Hair Loss (if your doctor says your hair will fall out)  . If your doctor says that your hair is likely to fall out, decide before you begin chemo whether you want to wear a wig. You may want to shop before treatment to match your hair color. . Hats, turbans, and scarves can also camouflage hair loss, although some people prefer to leave their heads uncovered. If you go bare-headed outdoors, be sure to use sunscreen on your scalp. . Cut your hair short. It eases the inconvenience of shedding lots of hair, but it also can reduce the emotional impact of watching your hair fall out. . Don't perm or color your hair during chemotherapy. Those chemical treatments are already damaging to hair and can enhance hair loss. Once your chemo treatments are done and your hair has grown back, it's OK to resume dyeing or perming hair. With chemotherapy, hair loss is almost always temporary. But when it grows back, it may be a different color or texture. In older adults who still had hair color before chemotherapy, the new growth may be completely gray.  Often, new hair is very fine and soft.  Infection Prevention Please wash your hands for at least 30 seconds using warm soapy water. Handwashing is the #1 way to prevent the spread of germs. Stay away from sick people or people who are getting over a cold. If you develop respiratory systems such as green/yellow mucus production or productive cough or persistent cough let us know and we will see if you need an antibiotic. It is a good idea to keep a pair of gloves on when going into grocery stores/[employer] to decrease your risk of coming into contact with germs on the carts,  etc. Carry alcohol hand gel with you at all times and use it frequently if out in public. If your temperature reaches 100.5 or higher please call the clinic and let us know.  If it is after hours or on the weekend please go to the ER if your temperature is over 100.5.  Please have your own personal thermometer at home to use.    Sex and bodily fluids If you are going to have sex, a condom must be used to protect the person that isn't taking chemotherapy. Chemo can decrease your libido (sex drive). For a few days after chemotherapy, chemotherapy can be excreted through your bodily fluids.  When using the toilet please close the lid and flush the toilet twice.  Do this for a few day after you have had chemotherapy.    Effects of chemotherapy on your sex life Some changes are simple and won't last long. They won't affect your sex life permanently. Sometimes you may feel: . too tired . not strong enough to be very active . sick or sore  . not in the mood . anxious or low Your anxiety might not seem related to sex. For example, you may be worried about the cancer and how your treatment is going. Or you may be worried about money, or about how you family are  coping with your illness. These things can cause stress, which can affect your interest in sex. It's important to talk to your partner about how you feel. Remember - the changes to your sex life don't usually last long. There's usually no medical reason to stop having sex during chemo. The drugs won't have any long term physical effects on your performance or enjoyment of sex. Cancer can't be passed on to your partner during sex  Contraception It's important to use reliable contraception during treatment. Avoid getting pregnant while you or your partner are having chemotherapy. This is because the drugs may harm the baby. Sometimes chemotherapy drugs can leave a man or woman infertile.  This means you would not be able to have children in the  future. You might want to talk to someone about permanent infertility. It can be very difficult to learn that you may no longer be able to have children. Some people find counselling helpful. There might be ways to preserve your fertility, although this is easier for men than for women. You may want to speak to a fertility expert. You can talk about sperm banking or harvesting your eggs. You can also ask about other fertility options, such as donor eggs. If you have or have had breast cancer, your doctor might advise you not to take the contraceptive pill. This is because the hormones in it might affect the cancer.  It is not known for sure whether or not chemotherapy drugs can be passed on through semen or secretions from the vagina. Because of this some doctors advise people to use a barrier method if you have sex during treatment. This applies to vaginal, anal or oral sex. Generally, doctors advise a barrier method only for the time you are actually having the treatment and for about a week after your treatment. Advice like this can be worrying, but this does not mean that you have to avoid being intimate with your partner. You can still have close contact with your partner and continue to enjoy sex.  Animals If you have cats or birds we just ask that you not change the litter or change the cage.  Please have someone else do this for you while you are on chemotherapy.   Food Safety During and After Cancer Treatment Food safety is important for people both during and after cancer treatment. Cancer and cancer treatments, such as chemotherapy, radiation therapy, and stem cell/bone marrow transplantation, often weaken the immune system. This makes it harder for your body to protect itself from foodborne illness, also called food poisoning. Foodborne illness is caused by eating food that contains harmful bacteria, parasites, or viruses.  Foods to avoid Some foods have a higher risk of becoming tainted  with bacteria. These include: Marland Kitchen Unwashed fresh fruit and vegetables, especially leafy vegetables that can hide dirt and other contaminants . Raw sprouts, such as alfalfa sprouts . Raw or undercooked beef, especially ground beef, or other raw or undercooked meat and poultry . Fatty, fried, or spicy foods immediately before or after treatment.  These can sit heavy on your stomach and make you feel nauseous. . Raw or undercooked shellfish, such as oysters. . Sushi and sashimi, which often contain raw fish.  . Unpasteurized beverages, such as unpasteurized fruit juices, raw milk, raw yogurt, or cider . Undercooked eggs, such as soft boiled, over easy, and poached; raw, unpasteurized eggs; or foods made with raw egg, such as homemade raw cookie dough and homemade mayonnaise Simple steps for  food Education officer, museum. . Do not buy food stored or displayed in an unclean area. . Do not buy bruised or damaged fruits or vegetables. . Do not buy cans that have cracks, dents, or bulges. . Pick up foods that can spoil at the end of your shopping trip and store them in a cooler on the way home. Prepare and clean up foods carefully. . Rinse all fresh fruits and vegetables under running water, and dry them with a clean towel or paper towel. . Clean the top of cans before opening them. . After preparing food, wash your hands for 20 seconds with hot water and soap. Pay special attention to areas between fingers and under nails. . Clean your utensils and dishes with hot water and soap. [place] Kitchen Disinfect your kitchen and cutting boards using 1 teaspoon of liquid, unscented bleach mixed into 1 quart of water.   Dispose of old food. . Eat canned and packaged food before its expiration date (the "use by" or "best before" date). . Consume refrigerated leftovers within 3 to 4 days. After that time, throw out the food. Even if the food does not smell or look spoiled, it still may be unsafe. Some bacteria, such as Listeria, can  grow even on foods stored in the refrigerator if they are kept for too long. Take precautions when eating out. . At restaurants, avoid buffets and salad bars where food sits out for a long time and comes in contact with many people. Food can become contaminated when someone with a virus, often a norovirus, or another "bug" handles it. . Put any leftover food in a "to-go" container yourself, rather than having the server do it. And, refrigerate leftovers as soon as you get home. . Choose restaurants that are clean and that are willing to prepare your food as you order it cooked.    MEDICATIONS:   EMLA cream. Apply a quarter size amount to port site 1 hour prior to chemo. Do not rub in. Cover with plastic wrap.   Over-the-Counter Meds:  Miralax 1 capful in 8 oz of fluid daily. May increase to two times a day if needed. This is a stool softener. If this doesn't work proceed you can add:  Senokot S-start with 1 tablet two times a day and increase to 4 tablets two times a day if needed. (total of 8 tablets in a 24 hour period). This is a stimulant laxative.   Call us if this does not help your bowels move.   Imodium 2mg  capsule. Take 2 capsules after the 1st loose stool and then 1 capsule every 2 hours until you go a total of 12 hours without having a loose stool. Call the [facility] if loose stools continue. If diarrhea occurs @ bedtime, take 2 capsules @ bedtime. Then take 2 capsules every 4 hours until morning. Call [facility].     Diarrhea Sheet  If you are having loose stools/diarrhea, please purchase Imodium and begin taking as outlined:  At the first sign of poorly formed or loose stools you should begin taking Imodium(loperamide) 2 mg capsules.  Take two caplets (4mg ) followed by one caplet (2mg ) every 2 hours until you have had no diarrhea for 12 hours.  During the night take two caplets (4mg ) at bedtime and continue every 4 hours during the night until the morning.  Stop taking  Imodium only after there is no sign of diarrhea for 12 hours.    Always call the [facility]  if you are having loose stools/diarrhea that you can't get under control.  Loose stools/disrrhea leads to dehydration (loss of water) in your body.  We have other options of trying to get the loose stools/diarrhea to stopped but you must let us know!     Constipation Sheet *Miralax in 8 oz of fluid daily.  May increase to two times a day if needed.  This is a stool softener.  If this not enough to keep your bowel regular:  You can add:  *Senokot S, start with one tablet twice a day and can increase to 4 tablets twice a day if needed.  This is a stimulant laxative.   Sometimes when you take pain medication you need BOTH a medicine to keep your stool soft and a medicine to help your bowel push it out!  Please call if the above does not work for you.   Do not go more than 2 days without a bowel movement.  It is very important that you do not become constipated.  It will make you feel sick to your stomach (nausea) and can cause abdominal pain and vomiting.     SYMPTOMS TO REPORT AS SOON AS POSSIBLE AFTER TREATMENT:  FEVER GREATER THAN 100.5 F  CHILLS WITH OR WITHOUT FEVER  NAUSEA AND VOMITING THAT IS NOT CONTROLLED WITH YOUR NAUSEA MEDICATION  UNUSUAL SHORTNESS OF BREATH  UNUSUAL BRUISING OR BLEEDING  TENDERNESS IN MOUTH AND THROAT WITH OR WITHOUT PRESENCE OF ULCERS  URINARY PROBLEMS  BOWEL PROBLEMS  UNUSUAL RASH    Wear comfortable clothing and clothing appropriate for easy access to any Portacath or PICC line. Let us know if there is anything that we can do to make your therapy better!     What to do if you need assistance after hours or on the weekends: CALL [phone].  HOLD on the line, do not hang up.  You will hear multiple messages but at the end you will be connected with a nurse triage line.  They will contact the doctor if necessary.  Most of the time they will be  able to assist you.  Do not call the hospital operator.      I have been informed and understand all of the instructions given to me and have received a copy. I have been instructed to call the clinic [phone]  or my family physician as soon as possible for continued medical care, if indicated. I do not have any more questions at this time but understand that I may call the [facility] at [phone] during office hours should I have questions or need assistance in obtaining follow-up care.

## 2017-09-30 ENCOUNTER — Ambulatory Visit (HOSPITAL_COMMUNITY): Payer: Managed Care, Other (non HMO)

## 2017-09-30 ENCOUNTER — Encounter (HOSPITAL_COMMUNITY): Payer: Self-pay

## 2017-09-30 NOTE — Patient Instructions (Signed)
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  You have had some progression therefore we are going to treat you with an immunotherapy called opdivo and yervoy.  They are given every 3 weeks or every 21 days.  There are no premedications given prior to administering this medication.   This treatment is given with palliative intent, which mean that you are treatable but not curable.  You will stay on this treatment until you have progression or you can no longer tolerate the drug.   You will see the doctor regularly throughout treatment.  We monitor your lab work prior to every treatment.  The doctor monitors your response to treatment by the way you are feeling, your blood work, and scans periodically.   POTENTIAL SIDE EFFECTS OF TREATMENT:  Nivolumab (Opdivo)  About This Drug Nivolumab is used to treat cancer. It is given in the vein (IV).  Possible Side Effects . Bone marrow depression. This is a decrease in the number of white blood cells, red blood cells, and platelets. This may raise your risk of infection, make you tired and weak (fatigue), and raise your risk of bleeding. . Tiredness and weakness . Joint, muscle and bone pain . Back pain . Loose bowel movements (diarrhea) . Nausea . Decreased appetite (decreased hunger) . Constipation (not able to move bowels) . Cough and trouble breathing . Upper respiratory infection . Fever . Rash and itching . Electrolyte changes Note: Each of the side effects above was reported in 20% or greater of patients treated with nivolumab. Not all possible side effects are included above. Your side effects may be different or more severe if you receive nivolumab in combination with other chemotherapy agents.  Warnings and Precautions . This drug works with your immune system and can cause inflammation in any of your organs and tissues and can change how they work. This may  put you at risk for developing serious medical problems which can very rarely be fatal. . Colitis (swelling (inflammation) in the colon) - symptoms are loose bowel movements (diarrhea) stomach cramping, and sometimes blood in the bowel movements . Changes in liver function . Changes in kidney function . Inflammation (swelling) of the lungs which can very rarely be fatal - you may have a dry cough or trouble breathing. . This drug may affect some of your hormone glands (especially the thyroid, adrenals, pituitary and pancreas). . Blood sugar levels may change and you may develop diabetes. If you already have diabetes, changes may need to be made to your diabetes medication. . Severe allergic skin reaction which can very rarely be fatal. You may develop blisters on your skin that are filled with fluid or a severe red rash all over your body that may be painful. . Changes in your central nervous system can happen. The central nervous system is made up of your brain and spinal cord. You could feel extreme tiredness, agitation, confusion, hallucinations (see or hear things that are not there), trouble understanding or speaking, loss of control of your bowels or bladder, eyesight changes, numbness or lack of strength to your arms, legs, face, or body, and coma. If you start to have any of these symptoms let your doctor know right away. . While you are getting this drug in your vein (IV), you may have a reaction to the drug. Sometimes you may be given medication to stop or lessen these side effects. Your nurse will  check you closely for these signs: fever or shaking chills, flushing, facial swelling, feeling dizzy, headache, trouble breathing, rash, itching, chest tightness, or chest pain. These reactions may happen after your infusion. If this happens, call 911 for emergency care. . Increased risk of complications, which may very rarely be fatal, in patients who will undergo a stem cell transplant after  receiving nivolumab.  Important Information . This drug may be present in the saliva, tears, sweat, urine, stool, vomit, semen, and vaginal secretions. Talk to your doctor and/or your nurse about the necessary precautions to take during this time.  Treating Side Effects . To decrease infection, wash your hands regularly . Avoid close contact with people who have a cold, the flu, or other infections. . Take your temperature as your doctor or nurse tells you, and whenever you feel like you may have a fever . To help decrease bleeding, use a soft toothbrush. Check with your nurse before using dental floss. . Be very careful when using knives or tools . Use an electric shaver instead of a razor . Ask your doctor or nurse about medicines that are available to help stop or lessen constipation, diarrhea and/or nausea. . Drink plenty of fluids (a minimum of eight glasses per day is recommended). . If you are not able to move your bowels, check with your doctor or nurse before you use any enemas, laxatives, or suppositories . To help with nausea and vomiting, eat small, frequent meals instead of three large meals a day. Choose foods and drinks that are at room temperature. Ask your nurse or doctor about other helpful tips and medicine that is available to help or stop lessen these symptoms. . If you get diarrhea, eat low-fiber foods that are high in protein and calories and avoid foods that can irritate your digestive tracts or lead to cramping. Ask your nurse or doctor about medicine that can lessen or stop your diarrhea. . To help with decreased appetite, eat small, frequent meals . Eat high caloric food such as pudding, ice cream, yogurt and milkshakes. . Manage tiredness by pacing your activities for the day. Be sure to include periods of rest between energy-draining activities . Keeping your pain under control is important to your wellbeing. Please tell your doctor or nurse if you are experiencing  pain. . If you have diabetes, keep good control of your blood sugar level. Tell your nurse or your doctor if your glucose levels are higher or lower than normal . If you get a rash do not put anything on it unless your doctor or nurse says you may. Keep the area around the rash clean and dry. Ask your doctor for medicine if your rash bothers you. . Infusion reactions may occur after your infusion. If this happens, call 911 for emergency care.  Food and Drug Interactions . There are no known interactions of nivolumab with food or other medications. . Tell your doctor and pharmacist about all the medicines and dietary supplements (vitamins, minerals, herbs and others) that you are taking at this time. The safety and use of dietary supplements and alternative diets are often not known. Using these might affect your cancer or interfere with your treatment. Until more is known, you should not use dietary supplements or alternative diets without your cancer doctor's help.  When to Call the Doctor Call your doctor or nurse if you have any of these symptoms and/or any new or unusual symptoms: . Fever of 100.5 F (38 C) or  higher . Chills . Easy bleeding or bruising . Wheezing or trouble breathing . Dry cough, or cough with yellow, green or bloody mucus . Confusion and/or agitation . Hallucinations . Trouble understanding or speaking . Blurry vision or changes in your eyesight . Numbness or lack of strength to your arms, legs, face, or body . Feeling dizzy or lightheaded . Loose bowel movements (diarrhea) more than 4 times a day or diarrhea with weakness or lightheadedness . Nausea that stops you from eating or drinking, and/or that is not relieved by prescribed medicines . Lasting loss of appetite or rapid weight loss of five pounds in a week . No bowel movement for 3 days or you feel uncomfortable . Bad abdominal pain, especially in upper right area . Fatigue or extreme weakness that interferes  with normal activities . Decreased urine . Unusual thirst or passing urine often . Rash that is not relieved by prescribed medicines . Rash or itching . Flu-like symptoms: fever, headache, muscle and joint aches, and fatigue (low energy, feeling weak) . Signs of liver problems: dark urine, pale bowel movements, bad stomach pain, feeling very tired and weak, unusual itching, or yellowing of the eyes or skin. . Signs of infusion reaction: fever or shaking chills, flushing, facial swelling, feeling dizzy, headache, trouble breathing, rash, itching, chest tightness, or chest pain. . If you think you may be pregnant  Reproduction Warnings . Pregnancy warning: This drug can have harmful effects on the unborn baby. Women of child bearing potential should use effective methods of birth control during your cancer treatment and for at least 5 months after treatment. Let your doctor know right away if you think you may be pregnant. . Breastfeeding warning: It is not known if this drug passes into breast milk. For this reason, women should not breast feed during treatment because this drug could enter the breast milk and cause harm to a breast feeding baby. . Fertility warning: Human fertility studies have not been done with this drug. Talk with your doctor or nurse if you plan to have children. Ask for information on sperm or egg banking.     Ipilimumab Curt Bears)  About This Drug Ipilimumab is used to treat cancer. It is given in the vein (IV).  Possible Side Effects . Tiredness . Loose bowel movements (diarrhea) . Colitis. This is swelling (inflammation) in the colon - symptoms are loose bowel movements (diarrhea) stomach cramping, and sometimes blood in the bowel movements . Nausea and throwing up (vomiting) . Decreased appetite (decreased hunger) . Weight loss . Headache . Fever . Trouble sleeping . Rash and itching Note: Each of the side effects above was reported in 5% or greater of  patients treated with ipilimumab. Not all possible side effects are included above.  Warnings and Precautions . This drug works with your immune system and can cause inflammation in any of your organs and tissues and can change how they work. This may put you at risk for developing serious medical problems which can very rarely be fatal. . Severe allergic skin reaction which can very rarely be fatal. You may develop blisters on your skin that are filled with fluid or a severe red rash all over your body that may be painful. . Severe colitis which can very rarely be fatal. This is swelling (inflammation) in the colon. . Changes in your liver function which can very rarely cause liver failure and be fatal . This drug may affect some of your hormone glands (especially  the thyroid, adrenals, pituitary and pancreas). . Inflammation of your nerves. You may feel numbness, tingling, or pain in your hands and feet. It may be hard for you to button your clothes, open jars, or walk as usual. The effect on the nerves may get worse with more doses of the drug. These effects get better in some people after the drug is stopped but it does not get better in all people. Very rarely, this can affect the nerves and muscles in your upper and lower body and cause paralysis. . Inflammation of your eye and/or other changes in eyesight  Important Information . This drug may be present in the saliva, tears, sweat, urine, stool, vomit, semen, and vaginal secretions. Talk to your doctor and/or your nurse about the necessary precautions to take during this time.  Treating Side Effects . Drink plenty of fluids (a minimum of eight glasses per day is recommended) . To help with decreased appetite, eat small, frequent meals . Eat high caloric food such as pudding, ice cream, yogurt and milkshakes. . To help with weight loss, drink fluids that contribute calories (whole milk, juice, soft drinks, sweetened beverages,  milkshakes, and nutritional supplements) instead of water. . Include a source of protein at every meal and snack, such as meat, poultry, fish, dry beans, tofu, eggs, nuts, milk, yogurt, cheese, ice cream, pudding, and nutritional supplements. . If you throw up or have loose bowel movements, you should drink more fluids so that you do not become dehydrated (lack water in the body from losing too much fluid). . To help with nausea and vomiting, eat small, frequent meals instead of three large meals a day. Choose foods and drinks that are at room temperature. Ask your nurse or doctor about other helpful tips and medicine that is available to help or stop lessen these symptoms. . If you get diarrhea, eat low-fiber foods that are high in protein and calories and avoid foods that can irritate your digestive tracts or lead to cramping. . Ask your nurse or doctor about medicine that can lessen or stop your diarrhea. Marland Kitchen Keeping your pain under control is important to your well-being. Please tell your doctor or nurse if you are experiencing pain. . If you get a rash do not put anything on it unless your doctor or nurse says you may. Keep the area around the rash clean and dry. Ask your doctor for medicine if your rash bothers you . If you are having trouble sleeping, talk to your nurse or doctor on tips to help you sleep better . If you have numbness and tingling in your hands and feet, be careful when cooking, walking, and handling sharp objects and hot liquids.  Food and Drug Interactions . There are no known interactions of ipilimumab with food or other medications. . Tell your doctor and pharmacist about all the medicines and dietary supplements (vitamins, minerals, herbs and others) that you are taking at this time. The safety and use of dietary supplements and alternative diets are often not known. Using these might affect your cancer or interfere with your treatment. Until more is known, you  should not use dietary supplements or alternative diets without your cancer doctor's help.  When to Call the Doctor Call your doctor or nurse if you have any of these symptoms and/or any new or unusual symptoms: . Fever of 100.5 F (38 C) or higher . Chills . Flu-like symptoms: fever, headache, muscle and joint aches, and fatigue (low energy, feeling  weak) . Lasting loss of appetite or rapid weight loss of five pounds in a week . Nausea that stops you from eating or drinking and/or is not relieved by prescribed medicines . Throwing up more than 3 times a day . Loose bowel movements (diarrhea) 4 times or loose bowel movements with lack of strength or a feeling of being dizzy . Pain in your abdomen that does not go away . Blood in your stool . Headache that does not go away . Blurred vision or other changes in eyesight . Signs of possible liver problems: dark urine, pale bowel movements, bad stomach pain, feeling very tired and weak, unusual itching, or yellowing of the eyes or skin . A new rash or a rash that is not relieved by prescribed medicines . Numbness, tingling, or pain your hands and feet . If you think you may be pregnant  Reproduction Warnings . Pregnancy warning: This drug can have harmful effects on the unborn baby. Women of child bearing potential should use effective methods of birth control during your cancer treatment and for at least 3 months after treatment. Let your doctor know right away if you think you may be pregnant . Breastfeeding warning: It is not known if this drug passes into breast milk. For this reason, women should not breast feed during treatment and for 3 months after treatment because this drug could enter the breast milk and cause harm to a breast feeding baby. . Fertility warning: Human fertility studies have not been done with this drug. Talk with your doctor or nurse if you plan to have children. Ask for information on sperm or egg  banking.    SELF CARE ACTIVITIES WHILE ON CHEMOTHERAPY: Hydration Increase your fluid intake 48 hours prior to treatment and drink at least 8 to 12 cups (64 ounces) of water/decaff beverages per day after treatment. You can still have your cup of coffee or soda but these beverages do not count as part of your 8 to 12 cups that you need to drink daily. No alcohol intake.  Medications Continue taking your normal prescription medication as prescribed.  If you start any new herbal or new supplements please let us know first to make sure it is safe.  Mouth Care Have teeth cleaned professionally before starting treatment. Keep dentures and partial plates clean. Use soft toothbrush and do not use mouthwashes that contain alcohol. Biotene is a good mouthwash that is available at most pharmacies or may be ordered by calling [phone]. Use warm salt water gargles (1 teaspoon salt per 1 quart warm water) before and after meals and at bedtime. Or you may rinse with 2 tablespoons of three-percent hydrogen peroxide mixed in eight ounces of water. If you are still having problems with your mouth or sores in your mouth please call the clinic. If you need dental work, please let Dr. Whitney Muse know before you go for your appointment so that we can coordinate the best possible time for you in regards to your chemo regimen. You need to also let your dentist know that you are actively taking chemo. We may need to do labs prior to your dental appointment.   Skin Care Always use sunscreen that has not expired and with SPF (Sun Protection Factor) of 50 or higher. Wear hats to protect your head from the sun. Remember to use sunscreen on your hands, ears, face, & feet.  Use good moisturizing lotions such as udder cream, eucerin, or even Vaseline. Some chemotherapies can  cause dry skin, color changes in your skin and nails.    . Avoid long, hot showers or baths. . Use gentle, fragrance-free soaps and laundry  detergent. . Use moisturizers, preferably creams or ointments rather than lotions because the thicker consistency is better at preventing skin dehydration. Apply the cream or ointment within 15 minutes of showering. Reapply moisturizer at night, and moisturize your hands every time after you wash them.  Hair Loss (if your doctor says your hair will fall out)  . If your doctor says that your hair is likely to fall out, decide before you begin chemo whether you want to wear a wig. You may want to shop before treatment to match your hair color. . Hats, turbans, and scarves can also camouflage hair loss, although some people prefer to leave their heads uncovered. If you go bare-headed outdoors, be sure to use sunscreen on your scalp. . Cut your hair short. It eases the inconvenience of shedding lots of hair, but it also can reduce the emotional impact of watching your hair fall out. . Don't perm or color your hair during chemotherapy. Those chemical treatments are already damaging to hair and can enhance hair loss. Once your chemo treatments are done and your hair has grown back, it's OK to resume dyeing or perming hair. With chemotherapy, hair loss is almost always temporary. But when it grows back, it may be a different color or texture. In older adults who still had hair color before chemotherapy, the new growth may be completely gray.  Often, new hair is very fine and soft.  Infection Prevention Please wash your hands for at least 30 seconds using warm soapy water. Handwashing is the #1 way to prevent the spread of germs. Stay away from sick people or people who are getting over a cold. If you develop respiratory systems such as green/yellow mucus production or productive cough or persistent cough let us know and we will see if you need an antibiotic. It is a good idea to keep a pair of gloves on when going into grocery stores/[employer] to decrease your risk of coming into contact with germs on the carts,  etc. Carry alcohol hand gel with you at all times and use it frequently if out in public. If your temperature reaches 100.5 or higher please call the clinic and let us know.  If it is after hours or on the weekend please go to the ER if your temperature is over 100.5.  Please have your own personal thermometer at home to use.    Sex and bodily fluids If you are going to have sex, a condom must be used to protect the person that isn't taking chemotherapy. Chemo can decrease your libido (sex drive). For a few days after chemotherapy, chemotherapy can be excreted through your bodily fluids.  When using the toilet please close the lid and flush the toilet twice.  Do this for a few day after you have had chemotherapy.    Effects of chemotherapy on your sex life Some changes are simple and won't last long. They won't affect your sex life permanently. Sometimes you may feel: . too tired . not strong enough to be very active . sick or sore  . not in the mood . anxious or low Your anxiety might not seem related to sex. For example, you may be worried about the cancer and how your treatment is going. Or you may be worried about money, or about how you family are  coping with your illness. These things can cause stress, which can affect your interest in sex. It's important to talk to your partner about how you feel. Remember - the changes to your sex life don't usually last long. There's usually no medical reason to stop having sex during chemo. The drugs won't have any long term physical effects on your performance or enjoyment of sex. Cancer can't be passed on to your partner during sex  Contraception It's important to use reliable contraception during treatment. Avoid getting pregnant while you or your partner are having chemotherapy. This is because the drugs may harm the baby. Sometimes chemotherapy drugs can leave a man or woman infertile.  This means you would not be able to have children in the  future. You might want to talk to someone about permanent infertility. It can be very difficult to learn that you may no longer be able to have children. Some people find counselling helpful. There might be ways to preserve your fertility, although this is easier for men than for women. You may want to speak to a fertility expert. You can talk about sperm banking or harvesting your eggs. You can also ask about other fertility options, such as donor eggs. If you have or have had breast cancer, your doctor might advise you not to take the contraceptive pill. This is because the hormones in it might affect the cancer.  It is not known for sure whether or not chemotherapy drugs can be passed on through semen or secretions from the vagina. Because of this some doctors advise people to use a barrier method if you have sex during treatment. This applies to vaginal, anal or oral sex. Generally, doctors advise a barrier method only for the time you are actually having the treatment and for about a week after your treatment. Advice like this can be worrying, but this does not mean that you have to avoid being intimate with your partner. You can still have close contact with your partner and continue to enjoy sex.  Animals If you have cats or birds we just ask that you not change the litter or change the cage.  Please have someone else do this for you while you are on chemotherapy.   Food Safety During and After Cancer Treatment Food safety is important for people both during and after cancer treatment. Cancer and cancer treatments, such as chemotherapy, radiation therapy, and stem cell/bone marrow transplantation, often weaken the immune system. This makes it harder for your body to protect itself from foodborne illness, also called food poisoning. Foodborne illness is caused by eating food that contains harmful bacteria, parasites, or viruses.  Foods to avoid Some foods have a higher risk of becoming tainted  with bacteria. These include: Marland Kitchen Unwashed fresh fruit and vegetables, especially leafy vegetables that can hide dirt and other contaminants . Raw sprouts, such as alfalfa sprouts . Raw or undercooked beef, especially ground beef, or other raw or undercooked meat and poultry . Fatty, fried, or spicy foods immediately before or after treatment.  These can sit heavy on your stomach and make you feel nauseous. . Raw or undercooked shellfish, such as oysters. . Sushi and sashimi, which often contain raw fish.  . Unpasteurized beverages, such as unpasteurized fruit juices, raw milk, raw yogurt, or cider . Undercooked eggs, such as soft boiled, over easy, and poached; raw, unpasteurized eggs; or foods made with raw egg, such as homemade raw cookie dough and homemade mayonnaise Simple steps for  food Education officer, museum. . Do not buy food stored or displayed in an unclean area. . Do not buy bruised or damaged fruits or vegetables. . Do not buy cans that have cracks, dents, or bulges. . Pick up foods that can spoil at the end of your shopping trip and store them in a cooler on the way home. Prepare and clean up foods carefully. . Rinse all fresh fruits and vegetables under running water, and dry them with a clean towel or paper towel. . Clean the top of cans before opening them. . After preparing food, wash your hands for 20 seconds with hot water and soap. Pay special attention to areas between fingers and under nails. . Clean your utensils and dishes with hot water and soap. Marland Kitchen Disinfect your kitchen and cutting boards using 1 teaspoon of liquid, unscented bleach mixed into 1 quart of water.   Dispose of old food. . Eat canned and packaged food before its expiration date (the "use by" or "best before" date). . Consume refrigerated leftovers within 3 to 4 days. After that time, throw out the food. Even if the food does not smell or look spoiled, it still may be unsafe. Some bacteria, such as Listeria, can  grow even on foods stored in the refrigerator if they are kept for too long. Take precautions when eating out. . At restaurants, avoid buffets and salad bars where food sits out for a long time and comes in contact with many people. Food can become contaminated when someone with a virus, often a norovirus, or another "bug" handles it. . Put any leftover food in a "to-go" container yourself, rather than having the server do it. And, refrigerate leftovers as soon as you get home. . Choose restaurants that are clean and that are willing to prepare your food as you order it cooked.    MEDICATIONS:   EMLA cream. Apply a quarter size amount to port site 1 hour prior to chemo. Do not rub in. Cover with plastic wrap.   Over-the-Counter Meds:  Miralax 1 capful in 8 oz of fluid daily. May increase to two times a day if needed. This is a stool softener. If this doesn't work proceed you can add:  Senokot S-start with 1 tablet two times a day and increase to 4 tablets two times a day if needed. (total of 8 tablets in a 24 hour period). This is a stimulant laxative.   Call us if this does not help your bowels move.   Imodium 2mg  capsule. Take 2 capsules after the 1st loose stool and then 1 capsule every 2 hours until you go a total of 12 hours without having a loose stool. Call the [facility] if loose stools continue. If diarrhea occurs @ bedtime, take 2 capsules @ bedtime. Then take 2 capsules every 4 hours until morning. Call [facility].     Diarrhea Sheet  If you are having loose stools/diarrhea, please purchase Imodium and begin taking as outlined:  At the first sign of poorly formed or loose stools you should begin taking Imodium(loperamide) 2 mg capsules.  Take two caplets (4mg ) followed by one caplet (2mg ) every 2 hours until you have had no diarrhea for 12 hours.  During the night take two caplets (4mg ) at bedtime and continue every 4 hours during the night until the morning.  Stop taking  Imodium only after there is no sign of diarrhea for 12 hours.    Always call the [facility]  if you are having loose stools/diarrhea that you can't get under control.  Loose stools/disrrhea leads to dehydration (loss of water) in your body.  We have other options of trying to get the loose stools/diarrhea to stopped but you must let us know!     Constipation Sheet *Miralax in 8 oz of fluid daily.  May increase to two times a day if needed.  This is a stool softener.  If this not enough to keep your bowel regular:  You can add:  *Senokot S, start with one tablet twice a day and can increase to 4 tablets twice a day if needed.  This is a stimulant laxative.   Sometimes when you take pain medication you need BOTH a medicine to keep your stool soft and a medicine to help your bowel push it out!  Please call if the above does not work for you.   Do not go more than 2 days without a bowel movement.  It is very important that you do not become constipated.  It will make you feel sick to your stomach (nausea) and can cause abdominal pain and vomiting.     SYMPTOMS TO REPORT AS SOON AS POSSIBLE AFTER TREATMENT:  FEVER GREATER THAN 100.5 F  CHILLS WITH OR WITHOUT FEVER  NAUSEA AND VOMITING THAT IS NOT CONTROLLED WITH YOUR NAUSEA MEDICATION  UNUSUAL SHORTNESS OF BREATH  UNUSUAL BRUISING OR BLEEDING  TENDERNESS IN MOUTH AND THROAT WITH OR WITHOUT PRESENCE OF ULCERS  URINARY PROBLEMS  BOWEL PROBLEMS  UNUSUAL RASH    Wear comfortable clothing and clothing appropriate for easy access to any Portacath or PICC line. Let us know if there is anything that we can do to make your therapy better!     What to do if you need assistance after hours or on the weekends: CALL [phone].  HOLD on the line, do not hang up.  You will hear multiple messages but at the end you will be connected with a nurse triage line.  They will contact the doctor if necessary.  Most of the time they will be  able to assist you.  Do not call the hospital operator.      I have been informed and understand all of the instructions given to me and have received a copy. I have been instructed to call the clinic [phone]  or my family physician as soon as possible for continued medical care, if indicated. I do not have any more questions at this time but understand that I may call the [facility] at [phone] during office hours should I have questions or need assistance in obtaining follow-up care.

## 2017-10-03 ENCOUNTER — Other Ambulatory Visit: Payer: Self-pay

## 2017-10-03 ENCOUNTER — Encounter (HOSPITAL_COMMUNITY): Payer: Managed Care, Other (non HMO)

## 2017-10-03 ENCOUNTER — Encounter (HOSPITAL_BASED_OUTPATIENT_CLINIC_OR_DEPARTMENT_OTHER): Payer: Managed Care, Other (non HMO)

## 2017-10-03 ENCOUNTER — Encounter (HOSPITAL_COMMUNITY): Payer: Self-pay

## 2017-10-03 VITALS — BP 121/61 | HR 79 | Temp 97.8°F | Resp 18 | Wt 112.8 lb

## 2017-10-03 DIAGNOSIS — C801 Malignant (primary) neoplasm, unspecified: Secondary | ICD-10-CM | POA: Diagnosis not present

## 2017-10-03 DIAGNOSIS — C7931 Secondary malignant neoplasm of brain: Secondary | ICD-10-CM

## 2017-10-03 DIAGNOSIS — C3431 Malignant neoplasm of lower lobe, right bronchus or lung: Secondary | ICD-10-CM

## 2017-10-03 DIAGNOSIS — C3490 Malignant neoplasm of unspecified part of unspecified bronchus or lung: Secondary | ICD-10-CM

## 2017-10-03 DIAGNOSIS — Z5112 Encounter for antineoplastic immunotherapy: Secondary | ICD-10-CM

## 2017-10-03 LAB — CBC WITH DIFFERENTIAL/PLATELET
Basophils Absolute: 0 10*3/uL (ref 0.0–0.1)
Basophils Relative: 0 %
Eosinophils Absolute: 0.1 10*3/uL (ref 0.0–0.7)
Eosinophils Relative: 1 %
HCT: 38.8 % (ref 36.0–46.0)
Hemoglobin: 12.7 g/dL (ref 12.0–15.0)
LYMPHS ABS: 0.5 10*3/uL — AB (ref 0.7–4.0)
LYMPHS PCT: 5 %
MCH: 29.8 pg (ref 26.0–34.0)
MCHC: 32.7 g/dL (ref 30.0–36.0)
MCV: 91.1 fL (ref 78.0–100.0)
MONO ABS: 0.5 10*3/uL (ref 0.1–1.0)
MONOS PCT: 5 %
Neutro Abs: 8.7 10*3/uL — ABNORMAL HIGH (ref 1.7–7.7)
Neutrophils Relative %: 89 %
PLATELETS: 153 10*3/uL (ref 150–400)
RBC: 4.26 MIL/uL (ref 3.87–5.11)
RDW: 17.9 % — ABNORMAL HIGH (ref 11.5–15.5)
WBC: 9.8 10*3/uL (ref 4.0–10.5)

## 2017-10-03 LAB — COMPREHENSIVE METABOLIC PANEL
ALBUMIN: 3.9 g/dL (ref 3.5–5.0)
ALT: 19 U/L (ref 14–54)
AST: 25 U/L (ref 15–41)
Alkaline Phosphatase: 60 U/L (ref 38–126)
Anion gap: 10 (ref 5–15)
BUN: 20 mg/dL (ref 6–20)
CHLORIDE: 95 mmol/L — AB (ref 101–111)
CO2: 26 mmol/L (ref 22–32)
CREATININE: 0.76 mg/dL (ref 0.44–1.00)
Calcium: 9.2 mg/dL (ref 8.9–10.3)
GFR calc Af Amer: >60 mL/min (ref >60)
GLUCOSE: 139 mg/dL — AB (ref 65–99)
POTASSIUM: 2.7 mmol/L — AB (ref 3.5–5.1)
SODIUM: 131 mmol/L — AB (ref 135–145)
Total Bilirubin: 0.7 mg/dL (ref 0.3–1.2)
Total Protein: 6.7 g/dL (ref 6.5–8.1)

## 2017-10-03 LAB — TSH: TSH: 1.66 u[IU]/mL (ref 0.350–4.500)

## 2017-10-03 MED ORDER — SODIUM CHLORIDE 0.9 % IV SOLN
1.0000 mg/kg | Freq: Once | INTRAVENOUS | Status: AC
  Administered 2017-10-03: 50 mg via INTRAVENOUS
  Filled 2017-10-03: qty 10

## 2017-10-03 MED ORDER — HEPARIN SOD (PORK) LOCK FLUSH 100 UNIT/ML IV SOLN
500.0000 [IU] | Freq: Once | INTRAVENOUS | Status: AC | PRN
  Administered 2017-10-03: 500 [IU]
  Filled 2017-10-03: qty 5

## 2017-10-03 MED ORDER — POTASSIUM CHLORIDE CRYS ER 20 MEQ PO TBCR
60.0000 meq | EXTENDED_RELEASE_TABLET | Freq: Once | ORAL | Status: AC
  Administered 2017-10-03: 60 meq via ORAL
  Filled 2017-10-03: qty 3

## 2017-10-03 MED ORDER — SODIUM CHLORIDE 0.9 % IV SOLN
2.8000 mg/kg | Freq: Once | INTRAVENOUS | Status: AC
  Administered 2017-10-03: 140 mg via INTRAVENOUS
  Filled 2017-10-03: qty 4

## 2017-10-03 MED ORDER — SODIUM CHLORIDE 0.9 % IV SOLN: Freq: Once | INTRAVENOUS | Status: AC

## 2017-10-03 MED ORDER — SODIUM CHLORIDE 0.9 % IV SOLN
INTRAVENOUS | Status: DC
  Administered 2017-10-03: 10:00:00 via INTRAVENOUS

## 2017-10-03 NOTE — Progress Notes (Signed)
CRITICAL VALUE ALERT Critical value received:  K+ 2.7 Date of notification:  10-03-2017  Time of notification: 9892 Critical value read back:  Yes.   Nurse who received alert:  C.Page RN MD notified (1st page):  Dr. Talbert Cage  Order rec'd for K-Dur 60 mEq po.  Okay to tx today per MD.   Tolerated infusions w/o adverse reaction.  Alert, in no distress.  VSS.  Discharged ambulatory.

## 2017-10-04 ENCOUNTER — Inpatient Hospital Stay (HOSPITAL_COMMUNITY): Payer: Managed Care, Other (non HMO)

## 2017-10-04 ENCOUNTER — Telehealth (HOSPITAL_COMMUNITY): Payer: Self-pay

## 2017-10-04 NOTE — Telephone Encounter (Signed)
24 hour follow up-left message for patient to call us if she had and questions or concerns.

## 2017-10-10 ENCOUNTER — Other Ambulatory Visit (HOSPITAL_COMMUNITY): Payer: Managed Care, Other (non HMO)

## 2017-10-10 ENCOUNTER — Encounter (HOSPITAL_COMMUNITY): Payer: Managed Care, Other (non HMO)

## 2017-10-10 ENCOUNTER — Other Ambulatory Visit: Payer: Self-pay

## 2017-10-10 ENCOUNTER — Encounter (HOSPITAL_BASED_OUTPATIENT_CLINIC_OR_DEPARTMENT_OTHER): Payer: Managed Care, Other (non HMO) | Admitting: Oncology

## 2017-10-10 ENCOUNTER — Encounter (HOSPITAL_COMMUNITY): Payer: Self-pay | Admitting: Oncology

## 2017-10-10 ENCOUNTER — Ambulatory Visit (HOSPITAL_COMMUNITY): Payer: Managed Care, Other (non HMO)

## 2017-10-10 VITALS — BP 118/83 | HR 104 | Temp 98.3°F | Resp 16 | Ht 65.0 in | Wt 117.0 lb

## 2017-10-10 DIAGNOSIS — E871 Hypo-osmolality and hyponatremia: Secondary | ICD-10-CM

## 2017-10-10 DIAGNOSIS — Z720 Tobacco use: Secondary | ICD-10-CM

## 2017-10-10 DIAGNOSIS — C3431 Malignant neoplasm of lower lobe, right bronchus or lung: Secondary | ICD-10-CM

## 2017-10-10 DIAGNOSIS — C801 Malignant (primary) neoplasm, unspecified: Secondary | ICD-10-CM | POA: Diagnosis not present

## 2017-10-10 DIAGNOSIS — K869 Disease of pancreas, unspecified: Secondary | ICD-10-CM

## 2017-10-10 DIAGNOSIS — C3490 Malignant neoplasm of unspecified part of unspecified bronchus or lung: Secondary | ICD-10-CM

## 2017-10-10 DIAGNOSIS — C7931 Secondary malignant neoplasm of brain: Secondary | ICD-10-CM

## 2017-10-10 DIAGNOSIS — C7949 Secondary malignant neoplasm of other parts of nervous system: Principal | ICD-10-CM

## 2017-10-10 LAB — COMPREHENSIVE METABOLIC PANEL
ALBUMIN: 3.6 g/dL (ref 3.5–5.0)
ALK PHOS: 63 U/L (ref 38–126)
ALT: 20 U/L (ref 14–54)
AST: 21 U/L (ref 15–41)
Anion gap: 10 (ref 5–15)
BUN: 22 mg/dL — ABNORMAL HIGH (ref 6–20)
CALCIUM: 9.1 mg/dL (ref 8.9–10.3)
CO2: 27 mmol/L (ref 22–32)
CREATININE: 0.7 mg/dL (ref 0.44–1.00)
Chloride: 96 mmol/L — ABNORMAL LOW (ref 101–111)
GFR calc non Af Amer: >60 mL/min (ref >60)
GLUCOSE: 127 mg/dL — AB (ref 65–99)
Potassium: 3.3 mmol/L — ABNORMAL LOW (ref 3.5–5.1)
SODIUM: 133 mmol/L — AB (ref 135–145)
Total Bilirubin: 0.3 mg/dL (ref 0.3–1.2)
Total Protein: 6.5 g/dL (ref 6.5–8.1)

## 2017-10-10 LAB — CBC WITH DIFFERENTIAL/PLATELET
Basophils Absolute: 0 10*3/uL (ref 0.0–0.1)
Basophils Relative: 0 %
EOS ABS: 0 10*3/uL (ref 0.0–0.7)
Eosinophils Relative: 0 %
HEMATOCRIT: 36 % (ref 36.0–46.0)
HEMOGLOBIN: 11.4 g/dL — AB (ref 12.0–15.0)
LYMPHS ABS: 0.2 10*3/uL — AB (ref 0.7–4.0)
LYMPHS PCT: 3 %
MCH: 30.2 pg (ref 26.0–34.0)
MCHC: 31.7 g/dL (ref 30.0–36.0)
MCV: 95.2 fL (ref 78.0–100.0)
MONOS PCT: 4 %
Monocytes Absolute: 0.3 10*3/uL (ref 0.1–1.0)
NEUTROS ABS: 7.2 10*3/uL (ref 1.7–7.7)
NEUTROS PCT: 93 %
Platelets: 109 10*3/uL — ABNORMAL LOW (ref 150–400)
RBC: 3.78 MIL/uL — AB (ref 3.87–5.11)
RDW: 18 % — ABNORMAL HIGH (ref 11.5–15.5)
WBC: 7.8 10*3/uL (ref 4.0–10.5)

## 2017-10-10 LAB — TSH: TSH: 0.391 u[IU]/mL (ref 0.350–4.500)

## 2017-10-10 NOTE — Progress Notes (Signed)
Practice, [facility] [place] [address]  Secondary malignant neoplasm of brain and spinal cord [facility]) - Plan: CT Abdomen Pelvis W Contrast, CT Chest W Contrast  Small cell carcinoma ([place]) - Plan: CT Abdomen Pelvis W Contrast, CT Chest W Contrast  CURRENT THERAPY: Salvage Topotecan therapy beginning on 02/28/2017.  INTERVAL HISTORY: Lydia Moore 56 y.o. female returns for followup of small cell lung cancer right lung, limited stage, at time of diagnosis, presenting with SIADH.  S/P 6 cycles of cisplatin/etoposide (07/27/2016-11/11/2016) curative intent.  She was evaluated by radiation oncology for involved field XRT during chemotherapy, but due to tumor burden, she was deemed not an XRT candidate.  She refused whole brain XRT in the prophylactic setting following systemic chemotherapy.  Short interval relapse of disease less than 6 months on restaging CT scans on 02/21/2017 leading to salvage treatment with Topotecan for recurrent rash extensive stage small cell lung cancer.  S/P second opinion at [facility] with Dr. Lissa Morales on 03/10/2017.     Small cell carcinoma ([facility])   07/12/2016 Imaging    CTA chest 3.9 x 3.5 cm right lower lobe mass with imaging features most compatible with a primary lung carcinoma.Marked metastatic right hilar and mediastinal adenopathy including a confluent mass of adenopathy in the right hilum, encasing and causing marked narrowing of the central pulmonary arteries on the right with a short segment of occlusion of the right lower lobe bronchus.Mild postobstructive changes in the inferior, medial right lower lobe.Mild changes of COPD       07/16/2016 Procedure    Endoscopic bronchoscopy with transbronchial biopsy of #7 nodes and biopsy of RLL lung mass      07/16/2016 Pathology Results    Lung, biopsy, Right Lower Lobe - SMALL CELL CARCINOMA.       07/21/2016 Imaging    MRI brain Negative MRI of the brain. No evidence for metastatic  disease to the brain or meninges.      07/26/2016 PET scan    Markedly hypermetabolic right lower lobe lesion with hypermetabolic metastatic disease in the right hilum and mediastinum. 2. Several scattered hypermetabolic ground-glass opacities in the right upper lobe. FDG accumulation within these nodules is higher than typically seen for infectious/inflammatory etiology although this remains within the differential. Atypical appearance of metastatic spread would also be a consideration. 3. Coronary artery atherosclerosis. 4. **An incidental finding of potential clinical significance has been found. 3.8 cm abdominal aortic aneurysm       07/27/2016 - 11/11/2016 Chemotherapy    Cisplatin/Etoposide x 6 cycles      11/09/2016 Treatment Plan Change    Patient declined/refused whole brain radiation.      11/23/2016 Imaging    CT chest with No new or progressive findings in the chest. 2. Further interval decrease in size of mediastinal lymphadenopathy. 3. Only trace scarring visible in the right lower lobe, at the location of the previous right lower lobe mass. 4. Coronary artery and thoracoabdominal aortic atherosclerosis      02/21/2017 Imaging    CT CAP- 1. Unfortunately the patient has had significant recurrence with marked mediastinal and right infrahilar adenopathy as well as suspected masses in the collapsed right lower lobe. The confluent adenopathy completely occlude the right lower lobe and right middle lobe bronchi, although there is some air trapping in the right middle lobe ; the right lower lobe is completely collapsed around several right lower lobe masses. 2. There is  some speckled density in the left anterior sixth rib which is not entirely specific but which is concerning for early osseous metastatic disease given that it was not present on 11/23/2016. Consider a bone scan to further assess the skeleton for other occult bony spread. 3. Infrarenal abdominal aortic  aneurysm 4.2 cm in diameter, increased from the prior measurement of 3.8 cm on 07/26/2016. Although this is only minor increase, it has occurred in a 7 month period. The patient has a previous existing relationship of cardiothoracic surgery and surveillance of this aneurysm by surgery is appropriate. 4. Other imaging findings of potential clinical significance: Coronary, aortic arch, and branch vessel atherosclerotic vascular disease. Mild lumbar spondylosis and degenerative disc disease.      02/22/2017 Relapse/Recurrence    Last Treatment Date: 11/11/2016 Recent Lab Values: Recent Lab Values: N/A      02/28/2017 -  Chemotherapy    Topotecan       03/08/2017 Imaging    MRI brain- 1. No evidence of metastatic disease. 2. Intermittently motion degraded.      03/08/2017 Imaging    Bone scan: IMPRESSION: No evidence skeletal metastasis by bone scintigraphy.      03/10/2017 Miscellaneous    Consult at [facility] with Dr. Aniceto Boss:  Following Topotecan therapy, she may be a candidate for immunotherapy at time of progression/relapse, with Nivolumab versus Nivolumab/Ipilumumab.  Response rates with single-agent immunotherapy is ~ 10% and with combo therapy 20-30%.  Responses are durable.  Rate of side effects from therapy are ~ 5% and 20-30%.  This therapy is not yet FDA approved and therefore if this therapy option is approved, prior approval from insurance company will be needed.  Dr. Aniceto Boss favors the combination therapy.  Additionally, she may be eligible for a clinical trial (ie Rova-T + nivolumab/ipilumumab).      04/07/2017 Imaging    CT CAP- 1. Persistent but improved bulky mediastinal and right hilar/infrahilar lymphadenopathy and significant decrease in size of the right lower lobe mass and obstructive pneumonitis. Residual branching endobronchial tumor in the right lower lobe. 2. No findings for pulmonary metastatic nodules or abdominal/pelvic lymphadenopathy. 3. Stable  infrarenal abdominal aortic aneurysm.      04/12/2017 Treatment Plan Change    Tx deferred x 1 week due to hyponatremia requiring work-up.      08/19/2017 Imaging    MRI Brain w/ and w/o contrast: IMPRESSION: New 4 x 8 mm metastasis at the medial left posterior frontal lobe. Three other questionable small metastases, 1 at the posterolateral inferior left parietal lobe, 1 in the right posterior parietal white matter, and 1 in the left frontal region. These cannot be confirmed on multiple sequences. Therefore, it may be worthwhile obtaining a 3 tesla examination, particularly if stereotactic radiotherapy is considered.      08/19/2017 Imaging    CT C/A/P: IMPRESSION: 1. Right lower lobe lung mass is increased since 08/04/2017 and significantly increased since 04/07/2017, with new occlusion of the right lower lobe bronchus and complete right lower lobe atelectasis. 2. Bulky ipsilateral hilar and right paratracheal and subcarinal mediastinal adenopathy, stable since 08/04/2017 and significantly increased since 04/07/2017. 3. New hypodense pancreatic tail mass, most compatible with pancreatic metastasis . 4. No additional sites of metastatic disease in the chest, abdomen or pelvis. 5. New trace dependent right pleural effusion. Increased interlobular septal thickening in the right middle lobe, favor localized pulmonary edema, cannot exclude lymphangitic tumor spread. 6. Small pericardial effusion, stable since 08/04/2017, new since 04/07/2017 . 7. Three-vessel coronary  atherosclerosis. 8. Aortic Atherosclerosis (ICD10-I70.0). Mildly increased 4.4 cm Abdominal Aortic Aneurysm (ICD10-I71.9). Recommend follow-up aortic ultrasound in 1 year. This recommendation follows ACR consensus guidelines: White Paper of the ACR Incidental Findings Committee II on Vascular Findings. J Am Coll Radiol 2013; 10:789-794. 9. Mild colonic diverticulosis.       08/19/2017 Progression    New brain  mets, enlarging right lower lobe mass, bulky chest lymphadenopathy, new pancreatic tail lesion      08/29/2017 - 09/09/2017 Radiation Therapy    Under the care of Dr. Lisbeth Renshaw   Radiation treatment dates:   08/29/2017 - 09/09/2017  Site/dose:   The brain and right lung were each treated to 30 Gy in 10 fractions of 3 Gy.         Patient presents today with her daughter for continued follow-up.  She received cycle 1 of Optivo with Yervoy on 10/03/2017 and tolerated it well without any side effects.  She states she has some pain in her right scapula from a known bone lesion, but she took some Cymbalta and it alleviated her pain.  She has been eating well and has gained 5 pounds.  She denies any chest pain, shortness of breath, abdominal pain, nausea, vomiting, diarrhea, headaches, dizziness.  Review of Systems  Constitutional: Negative for chills, fever, malaise/fatigue and weight loss.  HENT: Negative.  Negative for hearing loss, sore throat and tinnitus.   Eyes: Negative.  Negative for blurred vision, photophobia and discharge.  Respiratory: Negative for cough, hemoptysis, shortness of breath and wheezing.   Cardiovascular: Negative.  Negative for chest pain, palpitations, orthopnea, claudication and leg swelling.  Gastrointestinal: Negative for abdominal pain, blood in stool, constipation, diarrhea, melena, nausea and vomiting.  Genitourinary: Negative.  Negative for dysuria and hematuria.  Musculoskeletal: Negative.  Negative for back pain, joint pain and myalgias.  Skin: Negative.  Negative for itching and rash.  Neurological: Negative for dizziness, tingling, tremors, sensory change, speech change, focal weakness, seizures, loss of consciousness and weakness.  Endo/Heme/Allergies: Negative.  Negative for environmental allergies and polydipsia. Does not bruise/bleed easily.  Psychiatric/Behavioral: Negative.  Negative for depression. The patient is not nervous/anxious and does not have  insomnia.     Past Medical History:  Diagnosis Date  . Elevated cholesterol   . GERD (gastroesophageal reflux disease)   . GI bleed   . Goals of care, counseling/discussion 03/21/2017  . Hypertension   . 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 Small cell carcinoma ([place]) 07/21/2016    Past Surgical History:  Procedure Laterality Date  . ABDOMINAL HYSTERECTOMY     partial  . CESAREAN SECTION    . COLONOSCOPY    . PORTACATH PLACEMENT Left 08/04/2016   Procedure: INSERTION PORT-A-CATH LEFT SUBCLAVIAN;  Surgeon: Aviva Signs, MD;  Location: [facility];  Service: General;  Laterality: Left;  . TONSILLECTOMY    . VIDEO BRONCHOSCOPY WITH ENDOBRONCHIAL ULTRASOUND N/A 07/16/2016   Procedure: VIDEO BRONCHOSCOPY WITH ENDOBRONCHIAL ULTRASOUND with ultrasound transbronchial biopsy of node #7 and right lower lobe lesion;  Surgeon: Grace Isaac, MD;  Location: [facility] OR;  Service: Thoracic;  Laterality: N/A;    Family History  Problem Relation Age of Onset  . Heart disease Mother   . Heart disease Father   . COPD Sister   . HIV Brother     Social History   Socioeconomic History  . Marital status: Married    Spouse name: None  . Number of children: 4  . Years of education:  None  . Highest education level: None  Social Needs  . Financial resource strain: None  . Food insecurity - worry: None  . Food insecurity - inability: None  . Transportation needs - medical: None  . Transportation needs - non-medical: None  Occupational History  . Occupation: [employer]    Employer: [employer]  Tobacco Use  . Smoking status: Current Every Day Smoker    Packs/day: 0.50    Years: 35.00    Pack years: 17.50    Types: Cigarettes  . Smokeless tobacco: Never Used  Substance and Sexual Activity  . Alcohol use: Yes    Comment: occasionally, Couple drinks per mo  . Drug use: Yes    Frequency: 1.0 times per week    Types: Marijuana    Comment: weekly  . Sexual activity: Yes     Birth control/protection: Surgical    Comment: married  Other Topics Concern  . None  Social History Narrative   Lives w/ husband     PHYSICAL EXAMINATION  ECOG PERFORMANCE STATUS: 0 - Asymptomatic  Vitals:   10/10/17 1308  BP: 118/83  Pulse: (!) 104  Resp: 16  Temp: 98.3 F (36.8 C)  SpO2: 100%      Physical Exam  Constitutional: She is oriented to person, place, and time and well-developed, well-nourished, and in no distress. No distress.  HENT:  Head: Normocephalic and atraumatic.  Mouth/Throat: No oropharyngeal exudate.  Eyes: Conjunctivae are normal. Pupils are equal, round, and reactive to light. No scleral icterus.  Neck: Normal range of motion. Neck supple. No JVD present.  Cardiovascular: Normal rate, regular rhythm and normal heart sounds. Exam reveals no gallop and no friction rub.  No murmur heard. Pulmonary/Chest: Breath sounds normal. No respiratory distress. She has no wheezes. She has no rales.  Abdominal: Soft. Bowel sounds are normal. She exhibits no distension. There is no tenderness. There is no guarding.  Musculoskeletal: She exhibits no edema or tenderness.  Lymphadenopathy:    She has no cervical adenopathy.  Neurological: She is alert and oriented to person, place, and time. No cranial nerve deficit.  Skin: Skin is warm and dry. No rash noted. No erythema. No pallor.  Psychiatric: Affect and judgment normal.     LABORATORY DATA: CBC    Component Value Date/Time   WBC 7.8 10/10/2017 1233   RBC 3.78 (L) 10/10/2017 1233   HGB 11.4 (L) 10/10/2017 1233   HCT 36.0 10/10/2017 1233   PLT PENDING 10/10/2017 1233   MCV 95.2 10/10/2017 1233   MCH 30.2 10/10/2017 1233   MCHC 31.7 10/10/2017 1233   RDW 18.0 (H) 10/10/2017 1233   LYMPHSABS 0.2 (L) 10/10/2017 1233   MONOABS 0.3 10/10/2017 1233   EOSABS 0.0 10/10/2017 1233   BASOSABS 0.0 10/10/2017 1233      Chemistry      Component Value Date/Time   NA 131 (L) 10/03/2017 0929   NA 133 (L)  09/05/2017 1513   K 2.7 (LL) 10/03/2017 0929   K 4.1 09/05/2017 1513   CL 95 (L) 10/03/2017 0929   CO2 26 10/03/2017 0929   CO2 26 09/05/2017 1513   BUN 20 10/03/2017 0929   BUN 19.0 09/05/2017 1513   CREATININE 0.76 10/03/2017 0929   CREATININE 0.9 09/05/2017 1513      Component Value Date/Time   CALCIUM 9.2 10/03/2017 0929   CALCIUM 10.1 09/05/2017 1513   ALKPHOS 60 10/03/2017 0929   ALKPHOS 77 09/05/2017 1513   AST 25 10/03/2017  0929   AST 22 09/05/2017 1513   ALT 19 10/03/2017 0929   ALT 18 09/05/2017 1513   BILITOT 0.7 10/03/2017 0929   BILITOT 0.88 09/05/2017 1513        ASSESSMENT:  1. 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  S/P 6 cycles of Cisplatin/Etoposide (07/27/2016- 11/11/2016) with curative intent.  She was evaluated by radiation oncology for involved field XRT during chemotherapy, but due to tumor burden, she was deemed not an XRT candidate.  She refused whole brain XRT in the prophylactic setting following systemic chemotherapy.  Short interval relapse of disease <6 months on restaging CT imaging on 02/21/2017 leading to salvage treatment with Topotecan for recurrent/extensive stage SCLC.  S/P second opinion at [facility] with Dr. Lissa Morales on 03/10/2017. 08/2017 new brain mets, treated with RT.   -She has evidence of progressive disease with new small brain mets, enlarging right lower lobe mass causing complete obstruction of her right lower lobe bronchus and complete atelecstasis, bulky chest lymphadenopathy, new pancreatic tail lesion.  -Tolderated cycle 1 of opdivo/yervoy well. Proceed with cycle 2 as planned on 10/24/17.  -I have ordered her restaging scans CT C/A/P to be completed prior to cycle 3. -MRI brain for follow up of her brain mets to be done on 10/14/17. Follow up with Dr Lisbeth Renshaw as scheduled on 10/17/17.  2. 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 Treatment of this is to treat the underlying disease. - Stable. Last sodium on 10/03/17 was 131. CMP pending today. - Patient has not been compliant with taking her sodium tablets. Advised patient to continue her sodium tablets PO TID, she verbalized understanding. - I have advised patient to fluid restrict to less than a liter of fluids daily. - Encouraged patient to increase salt intake. - Potentially can add a loop diuretic with lasix 20mg  PO BID if worsening sodium level on fluid restriction and sodium tablets.     RTC for follow up with cycle 3 of treatment with restaging scans 1-2 days prior to cycle 3.  All questions were answered. The patient knows to call the clinic with any problems, questions or concerns. We can certainly see the patient much sooner if necessary.   This note is electronically signed by: Twana First, MD 10/10/2017 1:20 PM

## 2017-10-14 ENCOUNTER — Ambulatory Visit (HOSPITAL_COMMUNITY)
Admission: RE | Admit: 2017-10-14 | Discharge: 2017-10-14 | Disposition: A | Discharge status: Home / Self Care | Payer: Managed Care, Other (non HMO) | Source: Ambulatory Visit | Attending: Oncology | Admitting: Oncology

## 2017-10-14 DIAGNOSIS — C801 Malignant (primary) neoplasm, unspecified: Secondary | ICD-10-CM

## 2017-10-14 DIAGNOSIS — C7931 Secondary malignant neoplasm of brain: Secondary | ICD-10-CM | POA: Diagnosis present

## 2017-10-14 MED ORDER — GADOBENATE DIMEGLUMINE 529 MG/ML IV SOLN
10.0000 mL | Freq: Once | INTRAVENOUS | Status: AC | PRN
  Administered 2017-10-14: 10 mL via INTRAVENOUS

## 2017-10-17 ENCOUNTER — Encounter: Payer: Self-pay | Admitting: Radiation Oncology

## 2017-10-17 ENCOUNTER — Ambulatory Visit
Admission: RE | Admit: 2017-10-17 | Discharge: 2017-10-17 | Disposition: A | Discharge status: Home / Self Care | Payer: Managed Care, Other (non HMO) | Source: Ambulatory Visit | Attending: Radiation Oncology | Admitting: Radiation Oncology

## 2017-10-17 ENCOUNTER — Other Ambulatory Visit: Payer: Self-pay

## 2017-10-17 VITALS — BP 116/81 | HR 88 | Temp 98.4°F | Resp 18 | Ht 65.0 in | Wt 122.8 lb

## 2017-10-17 DIAGNOSIS — C3491 Malignant neoplasm of unspecified part of right bronchus or lung: Secondary | ICD-10-CM | POA: Diagnosis not present

## 2017-10-17 DIAGNOSIS — Z923 Personal history of irradiation: Secondary | ICD-10-CM | POA: Diagnosis not present

## 2017-10-17 DIAGNOSIS — C801 Malignant (primary) neoplasm, unspecified: Secondary | ICD-10-CM

## 2017-10-17 DIAGNOSIS — C7931 Secondary malignant neoplasm of brain: Secondary | ICD-10-CM | POA: Insufficient documentation

## 2017-10-17 DIAGNOSIS — Z79899 Other long term (current) drug therapy: Secondary | ICD-10-CM | POA: Insufficient documentation

## 2017-10-17 NOTE — Progress Notes (Signed)
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   Narrative:  The patient returns today for routine follow-up. The patient did well and tolerating her radiotherapy initially, but she did develop difficulty with nauseousness which required admission in October.  She recovered from this, and is currently receiving a dual agent immunotherapy.                           On review of systems, the patient states she feels very well overall, she states that her hair is starting to grow back on her scalp, and she feels that she is tolerating regular foods without difficulty swallowing, or with nauseousness at this time.  She is very hopeful that her immunotherapy will be beneficial and is looking forward to her next scan which is at the end of the month.  No other complaints are verbalized  ALLERGIES:  has No Known Allergies.  Meds: Current Outpatient Medications  Medication Sig Dispense Refill  . amLODipine (NORVASC) 5 MG tablet Take 5 mg by mouth daily.  3  . dexamethasone (DECADRON) 2 MG tablet Take 1 tablet (2 mg total) by mouth 2 (two) times daily with a meal. 60 tablet 1  . DULoxetine (CYMBALTA) 60 MG capsule TAKE ONE CAPSULE BY MOUTH EVERY DAY 30 capsule 3  . IPILIMUMAB IV Inject every 21 ( twenty-one) days into the vein.    Marland Kitchen NIVOLUMAB IV Inject every 21 ( twenty-one) days into the vein.    Marland Kitchen zolpidem (AMBIEN) 10 MG tablet Take 1 tablet (10 mg total) by mouth at bedtime as needed for sleep. 30 tablet 3  .  ondansetron (ZOFRAN-ODT) 8 MG disintegrating tablet TAKE 1 TABLET BY MOUTH EVERY 8 HOURS AS NEEDED FOR NAUSEA OR VOMITING  3   No current facility-administered medications for this encounter.     Physical Findings:  height is 5\' 5"  (1.651 m) and weight is 122 lb 12.8 oz (55.7 kg). Her oral temperature is 98.4 F (36.9 C). Her blood pressure is 116/81 and her pulse is 88. Her respiration is 18 and oxygen saturation is 99%.  Pain Assessment Pain Score: 0-No pain/10 In general this is a well appearing Caucasian female in no acute distress.  She's alert and oriented x4 and appropriate throughout the examination. Cardiopulmonary assessment is negative for acute distress and she exhibits normal effort.  Her scalp and chest wall are intact without evidence of desquamation.  Lab Findings: Lab Results  Component Value Date   WBC 7.8 10/10/2017   HGB 11.4 (L) 10/10/2017   HCT 36.0 10/10/2017   MCV 95.2 10/10/2017   PLT 109 (L) 10/10/2017     Radiographic Findings: Mr Jeri Cos NA Contrast  Result Date: 10/14/2017 CLINICAL DATA:  History of lung cancer. Follow-up metastatic disease. EXAM: MRI HEAD WITHOUT AND WITH CONTRAST TECHNIQUE: Multiplanar, multiecho pulse sequences of the brain and surrounding structures were obtained without and with intravenous contrast. CONTRAST:  64mL MULTIHANCE GADOBENATE DIMEGLUMINE 529 MG/ML IV SOLN COMPARISON:  08/19/2017.  03/08/2017. FINDINGS: Brain: Diffusion imaging does not show any acute or subacute infarction. Foci of abnormal enhancement demonstrated and suspected on the previous examination are no longer visible, consistent with successful response. No new or progressive lesions. Minimal small vessel change of the white matter again demonstrated. No large vessel territory insult. No hemorrhage, hydrocephalus or extra-axial collection. Vascular: Major vessels at the base of the brain show flow. Skull and upper cervical spine: Negative Sinuses/Orbits: Clear/normal  Other: None IMPRESSION: Resolution of previously demonstrated and suspected foci of enhancement consistent with successful response to treatment. No evidence of metastatic disease on today's examination. Electronically Signed   By: Nelson Chimes M.D.   On: 10/14/2017 11:28    Impression/Plan: 1. 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 The patient is doing well clinically, and we discussed the rationale for her to proceed with repeat MRI of the brain in 3 months time, we will follow along with her systemic response as well.  She will continue this at any pending cancer center otherwise.  She is encouraged to call if she has questions or concerns prior to her next visit.       Carola Rhine, PAC

## 2017-10-21 ENCOUNTER — Ambulatory Visit (HOSPITAL_COMMUNITY): Payer: Managed Care, Other (non HMO)

## 2017-10-24 ENCOUNTER — Ambulatory Visit (HOSPITAL_COMMUNITY): Payer: Managed Care, Other (non HMO)

## 2017-10-25 ENCOUNTER — Other Ambulatory Visit: Payer: Self-pay

## 2017-10-25 ENCOUNTER — Encounter (HOSPITAL_COMMUNITY): Payer: Self-pay

## 2017-10-25 ENCOUNTER — Ambulatory Visit (HOSPITAL_COMMUNITY): Payer: Managed Care, Other (non HMO)

## 2017-10-25 ENCOUNTER — Encounter (HOSPITAL_COMMUNITY): Payer: Managed Care, Other (non HMO) | Attending: Hematology & Oncology

## 2017-10-25 VITALS — BP 122/93 | HR 99 | Temp 98.2°F | Resp 16 | Wt 126.0 lb

## 2017-10-25 DIAGNOSIS — Z5112 Encounter for antineoplastic immunotherapy: Secondary | ICD-10-CM | POA: Diagnosis not present

## 2017-10-25 DIAGNOSIS — C7931 Secondary malignant neoplasm of brain: Secondary | ICD-10-CM

## 2017-10-25 DIAGNOSIS — C801 Malignant (primary) neoplasm, unspecified: Secondary | ICD-10-CM | POA: Insufficient documentation

## 2017-10-25 DIAGNOSIS — C3431 Malignant neoplasm of lower lobe, right bronchus or lung: Secondary | ICD-10-CM | POA: Diagnosis not present

## 2017-10-25 LAB — CBC WITH DIFFERENTIAL/PLATELET
Basophils Absolute: 0 10*3/uL (ref 0.0–0.1)
Basophils Relative: 0 %
EOS PCT: 1 %
Eosinophils Absolute: 0.1 10*3/uL (ref 0.0–0.7)
HCT: 39.4 % (ref 36.0–46.0)
HEMOGLOBIN: 12.2 g/dL (ref 12.0–15.0)
LYMPHS ABS: 0.4 10*3/uL — AB (ref 0.7–4.0)
LYMPHS PCT: 5 %
MCH: 30.4 pg (ref 26.0–34.0)
MCHC: 31 g/dL (ref 30.0–36.0)
MCV: 98.3 fL (ref 78.0–100.0)
Monocytes Absolute: 0.2 10*3/uL (ref 0.1–1.0)
Monocytes Relative: 3 %
NEUTROS PCT: 91 %
Neutro Abs: 6.9 10*3/uL (ref 1.7–7.7)
Platelets: 144 10*3/uL — ABNORMAL LOW (ref 150–400)
RBC: 4.01 MIL/uL (ref 3.87–5.11)
RDW: 19.1 % — ABNORMAL HIGH (ref 11.5–15.5)
WBC: 7.6 10*3/uL (ref 4.0–10.5)

## 2017-10-25 LAB — COMPREHENSIVE METABOLIC PANEL
ALT: 24 U/L (ref 14–54)
AST: 23 U/L (ref 15–41)
Albumin: 3.7 g/dL (ref 3.5–5.0)
Alkaline Phosphatase: 70 U/L (ref 38–126)
Anion gap: 9 (ref 5–15)
BUN: 20 mg/dL (ref 6–20)
CHLORIDE: 97 mmol/L — AB (ref 101–111)
CO2: 27 mmol/L (ref 22–32)
Calcium: 8.8 mg/dL — ABNORMAL LOW (ref 8.9–10.3)
Creatinine, Ser: 0.7 mg/dL (ref 0.44–1.00)
GFR calc Af Amer: >60 mL/min (ref >60)
Glucose, Bld: 142 mg/dL — ABNORMAL HIGH (ref 65–99)
POTASSIUM: 4 mmol/L (ref 3.5–5.1)
SODIUM: 133 mmol/L — AB (ref 135–145)
Total Bilirubin: 0.5 mg/dL (ref 0.3–1.2)
Total Protein: 6.4 g/dL — ABNORMAL LOW (ref 6.5–8.1)

## 2017-10-25 IMAGING — PT NM PET TUM IMG INITIAL (PI) SKULL BASE T - THIGH
1 of 8 series · 1 of 25 positions shown · non-contrast
Comparison: CT chest 07/12/2016.

CLINICAL DATA: Initial treatment strategy for lung mass.

EXAM:
NUCLEAR MEDICINE PET SKULL BASE TO THIGH
TECHNIQUE: 7.2 mCi F-18 FDG was injected intravenously. Full-ring PET imaging
was performed from the skull base to thigh after the radiotracer. CT
data was obtained and used for attenuation correction and anatomic
localization.
FASTING BLOOD GLUCOSE:  Value: 107 mg/dl

[Series 4: ct sk_thigh 5.0 b31f · axial · 5.0mm · 0.93mm/px · 1 of 208 slices shown]
[im 208/208  brain]
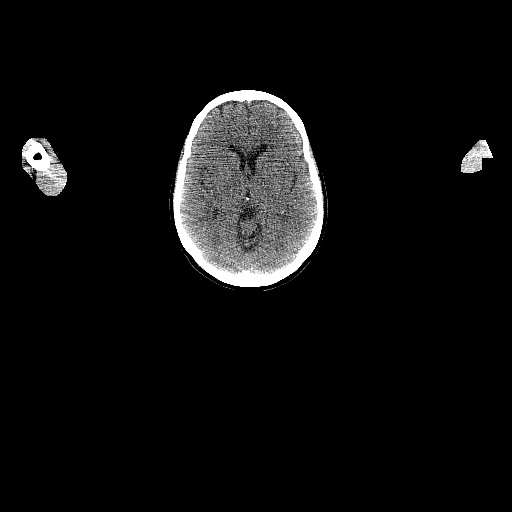

[1 of 25 positions shown; findings below may reference images not displayed]

FINDINGS: NECK

No hypermetabolic lymph nodes in the neck.

CHEST

[DATE] x 3.4 cm right lower lobe infrahilar lesion is markedly
hypermetabolic with SUV max = 12.3. Abnormal soft tissue tracks
cranially up into the inferior right hilum where hypermetabolic
right hilar lymphadenopathy demonstrates SUV max = 14.2. Date
subcarinal nodal conglomeration measuring approximately 4.5 x 4.0 cm
is hypermetabolic with SUV max = 14.1. Hypermetabolic
lymphadenopathy is seen in the AP window and right paratracheal
station with right paratracheal disease measuring up to SUV max =
15.7. No evidence for hypermetabolic metastatic disease in the left
hilum.

Multiple ground-glass nodules are seen in the right upper lobe.
Posterior right upper lobe ground-glass nodule seen on image 28
series 8 demonstrates SUV max = 5.9. Anterior and medial 7 x 12 mm
ground-glass nodule seen on image 33 series a demonstrates SUV max =
3.7. No evidence for hypermetabolic left lung nodule.

Heart size upper normal. No pericardial effusion. Coronary artery
calcification is evident.

ABDOMEN/PELVIS

No evidence for hypermetabolic disease in the liver or spleen. No
hypermetabolic lymphadenopathy in the abdomen or pelvis. Abdominal
aorta measures up to 3.8 cm in diameter, compatible with aneurysm.
No adrenal nodule or mass. No intraperitoneal free fluid.

SKELETON

No focal hypermetabolic activity to suggest skeletal metastasis.
IMPRESSION: 1. Markedly hypermetabolic right lower lobe lesion with
hypermetabolic metastatic disease in the right hilum and
mediastinum.
2. Several scattered hypermetabolic ground-glass opacities in the
right upper lobe. FDG accumulation within these nodules is higher
than typically seen for infectious/inflammatory etiology although
this remains within the differential. Atypical appearance of
metastatic spread would also be a consideration.
3. Coronary artery atherosclerosis.
4. **An incidental finding of potential clinical significance has
been found. 3.8 cm abdominal aortic aneurysm Recommend followup by
ultrasound in 2 years. This recommendation follows ACR consensus
guidelines: White Paper of the ACR Incidental Findings Committee II

## 2017-10-25 MED ORDER — SODIUM CHLORIDE 0.9% FLUSH
10.0000 mL | INTRAVENOUS | Status: DC | PRN
  Administered 2017-10-25: 10 mL
  Filled 2017-10-25: qty 10

## 2017-10-25 MED ORDER — SODIUM CHLORIDE 0.9 % IV SOLN
1.0000 mg/kg | Freq: Once | INTRAVENOUS | Status: AC
  Administered 2017-10-25: 50 mg via INTRAVENOUS
  Filled 2017-10-25: qty 10

## 2017-10-25 MED ORDER — SODIUM CHLORIDE 0.9 % IV SOLN
Freq: Once | INTRAVENOUS | Status: AC
  Administered 2017-10-25: 13:00:00 via INTRAVENOUS

## 2017-10-25 MED ORDER — HEPARIN SOD (PORK) LOCK FLUSH 100 UNIT/ML IV SOLN
500.0000 [IU] | Freq: Once | INTRAVENOUS | Status: AC | PRN
  Administered 2017-10-25: 500 [IU]

## 2017-10-25 MED ORDER — SODIUM CHLORIDE 0.9 % IV SOLN
140.0000 mg | Freq: Once | INTRAVENOUS | Status: AC
  Administered 2017-10-25: 140 mg via INTRAVENOUS
  Filled 2017-10-25: qty 10

## 2017-10-25 NOTE — Treatment Plan (Signed)
Ok to keep doses at 50 ipi and 140 Nivo despite wt gain per Dr Talbert Cage

## 2017-10-25 NOTE — Progress Notes (Signed)
Labs reviewed with MD. Proceed with treatment.  Treatment given per orders. Patient tolerated it well without problems. Vitals stable and discharged home from clinic ambulatory. Follow up as scheduled.

## 2017-10-27 NOTE — Patient Instructions (Signed)
[facility] Discharge Instructions for Patients Receiving Chemotherapy   Beginning January 23rd 2017 lab work for the Cancer Center will be done in the  Main lab at [facility] on 1st floor. If you have a lab appointment with the Cancer Center please come in thru the  Main Entrance and check in at the main information desk   Today you received the following chemotherapy agents   To help prevent nausea and vomiting after your treatment, we encourage you to take your nausea medication     If you develop nausea and vomiting, or diarrhea that is not controlled by your medication, call the clinic.  The clinic phone number is [phone]. Office hours are Monday-Friday 8:30am-5:00pm.  BELOW ARE SYMPTOMS THAT SHOULD BE REPORTED IMMEDIATELY:  *FEVER GREATER THAN 101.0 F  *CHILLS WITH OR WITHOUT FEVER  NAUSEA AND VOMITING THAT IS NOT CONTROLLED WITH YOUR NAUSEA MEDICATION  *UNUSUAL SHORTNESS OF BREATH  *UNUSUAL BRUISING OR BLEEDING  TENDERNESS IN MOUTH AND THROAT WITH OR WITHOUT PRESENCE OF ULCERS  *URINARY PROBLEMS  *BOWEL PROBLEMS  UNUSUAL RASH Items with * indicate a potential emergency and should be followed up as soon as possible. If you have an emergency after office hours please contact your primary care physician or go to the nearest emergency department.  Please call the clinic during office hours if you have any questions or concerns.   You may also contact the Patient Navigator at [phone] should you have any questions or need assistance in obtaining follow up care.      Resources For Cancer Patients and their Caregivers ? American Cancer Society: Can assist with transportation, wigs, general needs, runs Look Good Feel Better.        1-888-227-6333 ? Cancer Care: Provides financial assistance, online support groups, medication/co-pay assistance.  1-800-813-HOPE (4673) ? [facility] Assists [place] cancer  patients and their families through emotional , educational and financial support.  336-427-4357 ? [place] DSS Where to apply for food stamps, Medicaid and utility assistance. 336-342-1394 ? [facility]: Transportation to medical appointments. 336-347-2287 ? Social Security Administration: May apply for disability if have a Stage IV cancer. 336-342-7796 1-800-772-1213 ? [place] [employer], Disability and Transit Services: Assists with nutrition, care and transit needs. 336-349-2343         

## 2017-10-31 ENCOUNTER — Telehealth (HOSPITAL_COMMUNITY): Payer: Self-pay | Admitting: *Deleted

## 2017-10-31 ENCOUNTER — Other Ambulatory Visit (HOSPITAL_COMMUNITY): Payer: Self-pay | Admitting: Emergency Medicine

## 2017-10-31 DIAGNOSIS — J301 Allergic rhinitis due to pollen: Secondary | ICD-10-CM

## 2017-10-31 MED ORDER — LORATADINE 10 MG PO TABS: 10.0000 mg | ORAL_TABLET | Freq: Every day | ORAL | 3 refills | Status: AC | PRN

## 2017-10-31 NOTE — Telephone Encounter (Signed)
Claritin refilled.  Sent to [facility] in [place].

## 2017-11-11 ENCOUNTER — Ambulatory Visit (HOSPITAL_COMMUNITY)
Admission: RE | Admit: 2017-11-11 | Discharge: 2017-11-11 | Disposition: A | Discharge status: Home / Self Care | Payer: Managed Care, Other (non HMO) | Source: Ambulatory Visit | Attending: Oncology | Admitting: Oncology

## 2017-11-11 ENCOUNTER — Encounter (HOSPITAL_COMMUNITY): Payer: Self-pay

## 2017-11-11 ENCOUNTER — Telehealth (HOSPITAL_COMMUNITY): Payer: Self-pay | Admitting: *Deleted

## 2017-11-11 DIAGNOSIS — M899 Disorder of bone, unspecified: Secondary | ICD-10-CM | POA: Diagnosis not present

## 2017-11-11 DIAGNOSIS — I714 Abdominal aortic aneurysm, without rupture: Secondary | ICD-10-CM | POA: Diagnosis not present

## 2017-11-11 DIAGNOSIS — K869 Disease of pancreas, unspecified: Secondary | ICD-10-CM | POA: Insufficient documentation

## 2017-11-11 DIAGNOSIS — C801 Malignant (primary) neoplasm, unspecified: Secondary | ICD-10-CM | POA: Diagnosis present

## 2017-11-11 DIAGNOSIS — C7949 Secondary malignant neoplasm of other parts of nervous system: Secondary | ICD-10-CM | POA: Insufficient documentation

## 2017-11-11 DIAGNOSIS — I700 Atherosclerosis of aorta: Secondary | ICD-10-CM | POA: Diagnosis not present

## 2017-11-11 DIAGNOSIS — C7972 Secondary malignant neoplasm of left adrenal gland: Secondary | ICD-10-CM | POA: Insufficient documentation

## 2017-11-11 DIAGNOSIS — I2510 Atherosclerotic heart disease of native coronary artery without angina pectoris: Secondary | ICD-10-CM | POA: Diagnosis not present

## 2017-11-11 DIAGNOSIS — M8468XA Pathological fracture in other disease, other site, initial encounter for fracture: Secondary | ICD-10-CM | POA: Diagnosis not present

## 2017-11-11 DIAGNOSIS — C7931 Secondary malignant neoplasm of brain: Secondary | ICD-10-CM | POA: Insufficient documentation

## 2017-11-11 DIAGNOSIS — C7971 Secondary malignant neoplasm of right adrenal gland: Secondary | ICD-10-CM | POA: Insufficient documentation

## 2017-11-11 MED ORDER — IOPAMIDOL (ISOVUE-300) INJECTION 61%
100.0000 mL | Freq: Once | INTRAVENOUS | Status: AC | PRN
  Administered 2017-11-11: 100 mL via INTRAVENOUS

## 2017-11-11 NOTE — Telephone Encounter (Signed)
Called pt and let her know that we would do lab work but we would draw it out of her port when we accessed her for chemo.  She verbalized understanding.

## 2017-11-14 NOTE — Progress Notes (Signed)
Thank you :)

## 2017-11-16 ENCOUNTER — Encounter (HOSPITAL_COMMUNITY): Payer: Self-pay | Admitting: Hematology and Oncology

## 2017-11-16 ENCOUNTER — Telehealth: Payer: Self-pay | Admitting: Gastroenterology

## 2017-11-16 ENCOUNTER — Inpatient Hospital Stay (HOSPITAL_BASED_OUTPATIENT_CLINIC_OR_DEPARTMENT_OTHER): Payer: Managed Care, Other (non HMO) | Admitting: Hematology and Oncology

## 2017-11-16 ENCOUNTER — Inpatient Hospital Stay (HOSPITAL_COMMUNITY): Payer: Managed Care, Other (non HMO) | Attending: Hematology and Oncology

## 2017-11-16 ENCOUNTER — Encounter (HOSPITAL_COMMUNITY): Payer: Managed Care, Other (non HMO) | Admitting: Hematology and Oncology

## 2017-11-16 VITALS — BP 137/83 | HR 88 | Temp 98.5°F | Resp 18 | Ht 65.0 in | Wt 131.0 lb

## 2017-11-16 VITALS — BP 134/94 | HR 77 | Temp 98.4°F | Resp 18

## 2017-11-16 DIAGNOSIS — E876 Hypokalemia: Secondary | ICD-10-CM | POA: Insufficient documentation

## 2017-11-16 DIAGNOSIS — C3431 Malignant neoplasm of lower lobe, right bronchus or lung: Secondary | ICD-10-CM | POA: Diagnosis not present

## 2017-11-16 DIAGNOSIS — K8689 Other specified diseases of pancreas: Secondary | ICD-10-CM

## 2017-11-16 DIAGNOSIS — T451X5A Adverse effect of antineoplastic and immunosuppressive drugs, initial encounter: Secondary | ICD-10-CM

## 2017-11-16 DIAGNOSIS — M899 Disorder of bone, unspecified: Secondary | ICD-10-CM

## 2017-11-16 DIAGNOSIS — C7951 Secondary malignant neoplasm of bone: Secondary | ICD-10-CM | POA: Insufficient documentation

## 2017-11-16 DIAGNOSIS — Z79899 Other long term (current) drug therapy: Secondary | ICD-10-CM | POA: Insufficient documentation

## 2017-11-16 DIAGNOSIS — G620 Drug-induced polyneuropathy: Secondary | ICD-10-CM

## 2017-11-16 DIAGNOSIS — Z5112 Encounter for antineoplastic immunotherapy: Secondary | ICD-10-CM | POA: Diagnosis present

## 2017-11-16 DIAGNOSIS — K869 Disease of pancreas, unspecified: Secondary | ICD-10-CM

## 2017-11-16 DIAGNOSIS — C7931 Secondary malignant neoplasm of brain: Secondary | ICD-10-CM

## 2017-11-16 DIAGNOSIS — C3490 Malignant neoplasm of unspecified part of unspecified bronchus or lung: Secondary | ICD-10-CM

## 2017-11-16 DIAGNOSIS — Z720 Tobacco use: Secondary | ICD-10-CM

## 2017-11-16 DIAGNOSIS — C7949 Secondary malignant neoplasm of other parts of nervous system: Secondary | ICD-10-CM

## 2017-11-16 DIAGNOSIS — C801 Malignant (primary) neoplasm, unspecified: Secondary | ICD-10-CM

## 2017-11-16 LAB — COMPREHENSIVE METABOLIC PANEL
ALBUMIN: 3.6 g/dL (ref 3.5–5.0)
ALT: 18 U/L (ref 14–54)
AST: 19 U/L (ref 15–41)
Alkaline Phosphatase: 59 U/L (ref 38–126)
Anion gap: 9 (ref 5–15)
BUN: 20 mg/dL (ref 6–20)
CHLORIDE: 99 mmol/L — AB (ref 101–111)
CO2: 27 mmol/L (ref 22–32)
CREATININE: 0.63 mg/dL (ref 0.44–1.00)
Calcium: 8.9 mg/dL (ref 8.9–10.3)
GFR calc non Af Amer: >60 mL/min (ref >60)
Glucose, Bld: 87 mg/dL (ref 65–99)
Potassium: 3.6 mmol/L (ref 3.5–5.1)
SODIUM: 135 mmol/L (ref 135–145)
Total Bilirubin: 0.6 mg/dL (ref 0.3–1.2)
Total Protein: 6.5 g/dL (ref 6.5–8.1)

## 2017-11-16 LAB — CBC WITH DIFFERENTIAL/PLATELET
Basophils Absolute: 0 10*3/uL (ref 0.0–0.1)
Basophils Relative: 0 %
EOS ABS: 0.1 10*3/uL (ref 0.0–0.7)
Eosinophils Relative: 1 %
HCT: 37 % (ref 36.0–46.0)
HEMOGLOBIN: 11.5 g/dL — AB (ref 12.0–15.0)
LYMPHS ABS: 0.5 10*3/uL — AB (ref 0.7–4.0)
Lymphocytes Relative: 8 %
MCH: 29.9 pg (ref 26.0–34.0)
MCHC: 31.1 g/dL (ref 30.0–36.0)
MCV: 96.1 fL (ref 78.0–100.0)
MONOS PCT: 6 %
Monocytes Absolute: 0.4 10*3/uL (ref 0.1–1.0)
NEUTROS PCT: 85 %
Neutro Abs: 4.9 10*3/uL (ref 1.7–7.7)
Platelets: 116 10*3/uL — ABNORMAL LOW (ref 150–400)
RBC: 3.85 MIL/uL — ABNORMAL LOW (ref 3.87–5.11)
RDW: 18.3 % — ABNORMAL HIGH (ref 11.5–15.5)
WBC: 5.8 10*3/uL (ref 4.0–10.5)

## 2017-11-16 LAB — TSH: TSH: 2.711 u[IU]/mL (ref 0.350–4.500)

## 2017-11-16 MED ORDER — SODIUM CHLORIDE 0.9% FLUSH
10.0000 mL | INTRAVENOUS | Status: DC | PRN
  Administered 2017-11-16: 10 mL
  Filled 2017-11-16: qty 10

## 2017-11-16 MED ORDER — SODIUM CHLORIDE 0.9 % IV SOLN
140.0000 mg | Freq: Once | INTRAVENOUS | Status: AC
  Administered 2017-11-16: 140 mg via INTRAVENOUS
  Filled 2017-11-16: qty 4

## 2017-11-16 MED ORDER — SODIUM CHLORIDE 0.9 % IV SOLN
1.0000 mg/kg | Freq: Once | INTRAVENOUS | Status: AC
  Administered 2017-11-16: 50 mg via INTRAVENOUS
  Filled 2017-11-16: qty 10

## 2017-11-16 MED ORDER — DULOXETINE HCL 60 MG PO CPEP: 60.0000 mg | ORAL_CAPSULE | Freq: Every day | ORAL | 3 refills | Status: AC

## 2017-11-16 MED ORDER — DEXAMETHASONE 2 MG PO TABS: 2.0000 mg | ORAL_TABLET | Freq: Two times a day (BID) | ORAL | 1 refills | Status: AC

## 2017-11-16 MED ORDER — HEPARIN SOD (PORK) LOCK FLUSH 100 UNIT/ML IV SOLN
500.0000 [IU] | Freq: Once | INTRAVENOUS | Status: AC | PRN
  Administered 2017-11-16: 500 [IU]

## 2017-11-16 MED ORDER — SODIUM CHLORIDE 0.9 % IV SOLN
Freq: Once | INTRAVENOUS | Status: AC
  Administered 2017-11-16: 12:00:00 via INTRAVENOUS

## 2017-11-16 NOTE — Telephone Encounter (Signed)
Message has been sent to Dr.Perlov's nurse notifying her of this.

## 2017-11-16 NOTE — Treatment Plan (Signed)
Ok per Dr Lebron Conners to maintain IO therapy doses at previous doses.  Pt has gained 6.5 kg since treatment plan initiation, which is not unusual considering the holiday season.

## 2017-11-16 NOTE — Patient Instructions (Signed)
[facility] Discharge Instructions for Patients Receiving Chemotherapy   Beginning January 23rd 2017 lab work for the [facility] will be done in the  Main lab at [facility] on 1st floor. If you have a lab appointment with the [facility] please come in thru the  Main Entrance and check in at the main information desk   Today you received the following chemotherapy agents Yervoy and Opdivo. Follow-up as scheduled. Call clinic for any questions or concerns  To help prevent nausea and vomiting after your treatment, we encourage you to take your nausea medication   If you develop nausea and vomiting, or diarrhea that is not controlled by your medication, call the clinic.  The clinic phone number is [phone]. Office hours are Monday-Friday 8:30am-5:00pm.  BELOW ARE SYMPTOMS THAT SHOULD BE REPORTED IMMEDIATELY:  *FEVER GREATER THAN 101.0 F  *CHILLS WITH OR WITHOUT FEVER  NAUSEA AND VOMITING THAT IS NOT CONTROLLED WITH YOUR NAUSEA MEDICATION  *UNUSUAL SHORTNESS OF BREATH  *UNUSUAL BRUISING OR BLEEDING  TENDERNESS IN MOUTH AND THROAT WITH OR WITHOUT PRESENCE OF ULCERS  *URINARY PROBLEMS  *BOWEL PROBLEMS  UNUSUAL RASH Items with * indicate a potential emergency and should be followed up as soon as possible. If you have an emergency after office hours please contact your primary care physician or go to the nearest emergency department.  Please call the clinic during office hours if you have any questions or concerns.   You may also contact the Patient Navigator at [phone] should you have any questions or need assistance in obtaining follow up care.      Resources For Cancer Patients and their Caregivers ? American Cancer Society: Can assist with transportation, wigs, general needs, runs Look Good Feel Better.        212-498-5874 ? Cancer Care: Provides financial assistance, online support groups, medication/co-pay assistance.   1-800-813-HOPE (787) 691-9326) ? [facility] Assists [place] cancer patients and their families through emotional , educational and financial support.  952-832-5256 ? [place] DSS Where to apply for food stamps, Medicaid and utility assistance. 4144335917 ? [facility]: Transportation to medical appointments. 619-205-4328 ? Social Security Administration: May apply for disability if have a Stage IV cancer. 862-764-6106 (820)160-7370 ? [employer], Disability and Transit Services: Assists with nutrition, care and transit needs. (424)687-6009

## 2017-11-16 NOTE — Progress Notes (Signed)
1120 Lab results reviewed with Dr. Lebron Conners today and pt approved for chemo tx per MD                                             Lydia Moore tolerated chemotherapy tx well without complaints or incident. VSS upon discharge. Pt discharged self ambulatory in satisfactory condition

## 2017-11-16 NOTE — Telephone Encounter (Signed)
vanessa can you let them know that Dr Ardis Hughs is out of the office until Monday.

## 2017-11-16 NOTE — Telephone Encounter (Signed)
Patient with diagnosis of small cell lung cancer on immunotherapy and a progressive mass in the body of the pancreas, please eval for possible EUS Bx to obtain tissue diagnosis on exact nature of the mass -- SCLC vs primary pancreatic cancer

## 2017-11-16 NOTE — Telephone Encounter (Signed)
Dr Jacobs please advise  

## 2017-11-21 ENCOUNTER — Telehealth: Payer: Self-pay

## 2017-11-21 NOTE — Telephone Encounter (Signed)
-----   Message from Milus Banister, MD sent at 11/21/2017  7:39 AM EST ----- Chong Sicilian, Just back from being out of town.  I am happy to help.  EUS schedule is full this week..  Please arrange upper EUS radial +/- linear, next Thursday January 17th for pancreatic mass in setting on known lung cancer.  Thanks  Dr. Lebron Conners, Juluis Rainier.  Thanks      ----- Message ----- From: Jeoffrey Massed, RN Sent: 11/16/2017  11:44 AM To: Milus Banister, MD

## 2017-11-21 NOTE — Telephone Encounter (Signed)
Milus Banister, MD  Jeoffrey Massed, RN Cc: Ardath Sax, MD        Marquasha Brutus,  Just back from being out of town. I am happy to help. EUS schedule is full this week.. Please arrange upper EUS radial +/- linear, next Thursday January 17th for pancreatic mass in setting on known lung cancer. Thanks   Dr. Lebron Conners, Juluis Rainier. Thanks

## 2017-11-22 ENCOUNTER — Other Ambulatory Visit: Payer: Self-pay

## 2017-11-22 DIAGNOSIS — K8689 Other specified diseases of pancreas: Secondary | ICD-10-CM

## 2017-11-22 NOTE — Telephone Encounter (Signed)
EUS scheduled for 12/08/17 1 pm. Left message on machine to call back instructions mailed to the home

## 2017-11-23 ENCOUNTER — Other Ambulatory Visit: Payer: Self-pay

## 2017-11-23 ENCOUNTER — Encounter: Payer: Self-pay | Admitting: Gastroenterology

## 2017-11-23 ENCOUNTER — Encounter (HOSPITAL_COMMUNITY): Payer: Self-pay | Admitting: Emergency Medicine

## 2017-11-23 NOTE — Telephone Encounter (Signed)
EUS scheduled, pt instructed and medications reviewed.  Patient instructions mailed to home.  Patient to call with any questions or concerns.  An error was made on the pt instructions her EUS has been scheduled for 12/01/17 however, 1/24 was put on her paperwork.  The pt was advised and she did verbalize the correct date of 1/17

## 2017-11-25 NOTE — Assessment & Plan Note (Signed)
57 y.o. female with widely metastatic status of stage small cell lung cancer with known involvement of the brain and suspected involvement of the abdomen.  Currently undergoing palliative immunotherapy with combination of ipilimumab and nivolumab, having received 2 cycles of therapy.  Interval disease assessment with CT of the chest/abdomen/pelvis demonstrates positive response in the chest with decreased mediastinal and hilar lymphadenopathy as well as the pulmonary masses.  Nevertheless, there is new disease/progression appearing as multifocal skeletal lesions as well as progressive mass in the tail of the pancreas and a new lesion in the perinephric fat measuring 1.9 cm.  This may represent progression of a small cell lung cancer may present as a multi-clonal disease is 1 of the sub-clones not responsive to immunotherapy or a second malignancy scissoring the mass in the pancreas has been present for period of time and may represent pancreatic adenocarcinoma status small cell lung cancer.  Plan: -- Proceed with the third cycle of ipilimumab/nivolumab -- Consult gastroenterology for EGD/EUS and biopsy of the pancreatic tail lesion if possible.  If not, will consult interventional radiology for biopsy of the retroperitoneal perinephric mass.  Purposes to identify whether the abdominal and skeletal progression is related to the small cell lung cancer or due to another malignancy as this will dramatically change recommended therapies. -- Return to clinic in 1 week after the biopsy, we will likely need to add Denosumab to the treatment schedule due to appearance of skeletal disease.

## 2017-11-25 NOTE — H&P (View-Only) (Signed)
[facility] [facility] Follow-up Visit:  Assessment: Small cell lung cancer [facility]) 57 y.o. female with widely metastatic status of stage small cell lung cancer with known involvement of the brain and suspected involvement of the abdomen.  Currently undergoing palliative immunotherapy with combination of ipilimumab and nivolumab, having received 2 cycles of therapy.  Interval disease assessment with CT of the chest/abdomen/pelvis demonstrates positive response in the chest with decreased mediastinal and hilar lymphadenopathy as well as the pulmonary masses.  Nevertheless, there is new disease/progression appearing as multifocal skeletal lesions as well as progressive mass in the tail of the pancreas and a new lesion in the perinephric fat measuring 1.9 cm.  This may represent progression of a small cell lung cancer may present as a multi-clonal disease is 1 of the sub-clones not responsive to immunotherapy or a second malignancy scissoring the mass in the pancreas has been present for period of time and may represent pancreatic adenocarcinoma status small cell lung cancer.  Plan: -- Proceed with the third cycle of ipilimumab/nivolumab -- Consult gastroenterology for EGD/EUS and biopsy of the pancreatic tail lesion if possible.  If not, will consult interventional radiology for biopsy of the retroperitoneal perinephric mass.  Purposes to identify whether the abdominal and skeletal progression is related to the small cell lung cancer or due to another malignancy as this will dramatically change recommended therapies. -- Return to clinic in 1 week after the biopsy, we will likely need to add Denosumab to the treatment schedule due to appearance of skeletal disease.  Voice recognition software was used and creation of this note. Despite my best effort at editing the text, some misspelling/errors may have occurred.  Orders Placed This Encounter  Procedures  . CBC with Differential    Standing  Status:   Future    Standing Expiration Date:   11/16/2018  . Comprehensive metabolic panel    Standing Status:   Future    Standing Expiration Date:   11/16/2018  . Magnesium    Standing Status:   Future    Standing Expiration Date:   11/16/2018  . CA 19.9    Standing Status:   Future    Standing Expiration Date:   11/16/2018  . ACTH    Standing Status:   Future    Standing Expiration Date:   11/16/2018  . Cortisol    Standing Status:   Future    Standing Expiration Date:   11/16/2018  . TSH    Standing Status:   Future    Standing Expiration Date:   11/16/2018  . Ambulatory referral to Gastroenterology    Referral Priority:   Routine    Referral Type:   Consultation    Referral Reason:   Specialty Services Required    Referred to Provider:   Milus Banister, MD    Number of Visits Requested:   1    Cancer Staging No matching staging information was found for the patient.  All questions were answered.  . The patient knows to call the clinic with any problems, questions or concerns.  This note was electronically signed.    History of Presenting Illness Lydia Moore 57 y.o. presenting to the [place] for clinical monitoring for diagnosis of extensive stage small cell lung cancer with previous metastasis to the brain and the abdominal cavity.  Patient is currently undergoing palliative immunotherapy with combination of ipilimumab and nivolumab, and presents to the clinic to undergo the third cycle of systemic therapy.  Restaging imaging has been obtained prior to this visit.  At this time, patient reports feeling reasonably well.  Her respiratory symptoms have resolved.  She denies any new symptoms of abdominal pain.  No headaches, nausea, diarrhea, skin rash.  Oncological/hematological History:   Small cell lung cancer ([place])   07/12/2016 Imaging    CTA chest 3.9 x 3.5 cm right lower lobe mass with imaging features most compatible with a primary lung carcinoma.Marked metastatic  right hilar and mediastinal adenopathy including a confluent mass of adenopathy in the right hilum, encasing and causing marked narrowing of the central pulmonary arteries on the right with a short segment of occlusion of the right lower lobe bronchus.Mild postobstructive changes in the inferior, medial right lower lobe.Mild changes of COPD       07/16/2016 Procedure    Endoscopic bronchoscopy with transbronchial biopsy of #7 nodes and biopsy of RLL lung mass      07/16/2016 Pathology Results    Lung, biopsy, Right Lower Lobe - SMALL CELL CARCINOMA.       07/21/2016 Imaging    MRI brain Negative MRI of the brain. No evidence for metastatic disease to the brain or meninges.      07/26/2016 PET scan    Markedly hypermetabolic right lower lobe lesion with hypermetabolic metastatic disease in the right hilum and mediastinum. 2. Several scattered hypermetabolic ground-glass opacities in the right upper lobe. FDG accumulation within these nodules is higher than typically seen for infectious/inflammatory etiology although this remains within the differential. Atypical appearance of metastatic spread would also be a consideration. 3. Coronary artery atherosclerosis. 4. **An incidental finding of potential clinical significance has been found. 3.8 cm abdominal aortic aneurysm       07/27/2016 - 11/11/2016 Chemotherapy    Cisplatin/Etoposide x 6 cycles      11/09/2016 Treatment Plan Change    Patient declined/refused whole brain radiation.      11/23/2016 Imaging    CT chest with No new or progressive findings in the chest. 2. Further interval decrease in size of mediastinal lymphadenopathy. 3. Only trace scarring visible in the right lower lobe, at the location of the previous right lower lobe mass. 4. Coronary artery and thoracoabdominal aortic atherosclerosis      02/21/2017 Imaging    CT CAP- 1. Unfortunately the patient has had significant recurrence with marked mediastinal and  right infrahilar adenopathy as well as suspected masses in the collapsed right lower lobe. The confluent adenopathy completely occlude the right lower lobe and right middle lobe bronchi, although there is some air trapping in the right middle lobe ; the right lower lobe is completely collapsed around several right lower lobe masses. 2. There is some speckled density in the left anterior sixth rib which is not entirely specific but which is concerning for early osseous metastatic disease given that it was not present on 11/23/2016. Consider a bone scan to further assess the skeleton for other occult bony spread. 3. Infrarenal abdominal aortic aneurysm 4.2 cm in diameter, increased from the prior measurement of 3.8 cm on 07/26/2016. Although this is only minor increase, it has occurred in a 7 month period. The patient has a previous existing relationship of cardiothoracic surgery and surveillance of this aneurysm by surgery is appropriate. 4. Other imaging findings of potential clinical significance: Coronary, aortic arch, and branch vessel atherosclerotic vascular disease. Mild lumbar spondylosis and degenerative disc disease.      02/22/2017 Relapse/Recurrence    Last Treatment Date: 11/11/2016 Recent Lab  Values: Recent Lab Values: N/A      02/28/2017 -  Chemotherapy    Topotecan       03/08/2017 Imaging    MRI brain- 1. No evidence of metastatic disease. 2. Intermittently motion degraded.      03/08/2017 Imaging    Bone scan: IMPRESSION: No evidence skeletal metastasis by bone scintigraphy.      03/10/2017 Miscellaneous    Consult at [facility] with Dr. Stinchcombe:  Following Topotecan therapy, she may be a candidate for immunotherapy at time of progression/relapse, with Nivolumab versus Nivolumab/Ipilumumab.  Response rates with single-agent immunotherapy is ~ 10% and with combo therapy 20-30%.  Responses are durable.  Rate of side effects from therapy are ~ 5% and 20-30%.  This  therapy is not yet FDA approved and therefore if this therapy option is approved, prior approval from insurance company will be needed.  Dr. Stinchcombe favors the combination therapy.  Additionally, she may be eligible for a clinical trial (ie Rova-T + nivolumab/ipilumumab).      04/07/2017 Imaging    CT CAP- 1. Persistent but improved bulky mediastinal and right hilar/infrahilar lymphadenopathy and significant decrease in size of the right lower lobe mass and obstructive pneumonitis. Residual branching endobronchial tumor in the right lower lobe. 2. No findings for pulmonary metastatic nodules or abdominal/pelvic lymphadenopathy. 3. Stable infrarenal abdominal aortic aneurysm.      04/12/2017 Treatment Plan Change    Tx deferred x 1 week due to hyponatremia requiring work-up.      08/19/2017 Imaging    MRI Brain w/ and w/o contrast: IMPRESSION: New 4 x 8 mm metastasis at the medial left posterior frontal lobe. Three other questionable small metastases, 1 at the posterolateral inferior left parietal lobe, 1 in the right posterior parietal white matter, and 1 in the left frontal region. These cannot be confirmed on multiple sequences. Therefore, it may be worthwhile obtaining a 3 tesla examination, particularly if stereotactic radiotherapy is considered.      08/19/2017 Imaging    CT C/A/P: IMPRESSION: 1. Right lower lobe lung mass is increased since 08/04/2017 and significantly increased since 04/07/2017, with new occlusion of the right lower lobe bronchus and complete right lower lobe atelectasis. 2. Bulky ipsilateral hilar and right paratracheal and subcarinal mediastinal adenopathy, stable since 08/04/2017 and significantly increased since 04/07/2017. 3. New hypodense pancreatic tail mass, most compatible with pancreatic metastasis . 4. No additional sites of metastatic disease in the chest, abdomen or pelvis. 5. New trace dependent right pleural effusion.  Increased interlobular septal thickening in the right middle lobe, favor localized pulmonary edema, cannot exclude lymphangitic tumor spread. 6. Small pericardial effusion, stable since 08/04/2017, new since 04/07/2017 . 7. Three-vessel coronary atherosclerosis. 8. Aortic Atherosclerosis (ICD10-I70.0). Mildly increased 4.4 cm Abdominal Aortic Aneurysm (ICD10-I71.9). Recommend follow-up aortic ultrasound in 1 year. This recommendation follows ACR consensus guidelines: White Paper of the ACR Incidental Findings Committee II on Vascular Findings. J Am Coll Radiol 2013; 10:789-794. 9. Mild colonic diverticulosis.       08/19/2017 Progression    New brain mets, enlarging right lower lobe mass, bulky chest lymphadenopathy, new pancreatic tail lesion      08/29/2017 - 09/09/2017 Radiation Therapy    Under the care of Dr. Moody   Radiation treatment dates:   08/29/2017 - 09/09/2017  Site/dose:   The brain and right lung were each treated to 30 Gy in 10 fractions of 3 Gy.        11/11/2017 Imaging    CT   C/A/P: Interval decrease in size of mediastinal and right hilar lymphadenopathy down to 0.8 cm.  Decreased right middle lobe and right lower lobe mass lesions.  Unfortunately, multiple new skeletal lesions are noted including pathological fracture of the second rib on the right.  Mass in the tail of the pancreas is increasing in size now measuring 6.7 cm.  Additional new metastatic lesion in the perinephric fat measuring 1.9 cm.       Medical History: Past Medical History:  Diagnosis Date  . Elevated cholesterol   . GERD (gastroesophageal reflux disease)   . GI bleed   . Goals of care, counseling/discussion 03/21/2017  . Hypertension   . 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 Small cell carcinoma ([place]) 07/21/2016    Surgical History: Past Surgical History:  Procedure Laterality Date  . ABDOMINAL HYSTERECTOMY     partial  . CESAREAN SECTION    .  COLONOSCOPY    . PORTACATH PLACEMENT Left 08/04/2016   Procedure: INSERTION PORT-A-CATH LEFT SUBCLAVIAN;  Surgeon: Aviva Signs, MD;  Location: [facility];  Service: General;  Laterality: Left;  . TONSILLECTOMY    . VIDEO BRONCHOSCOPY WITH ENDOBRONCHIAL ULTRASOUND N/A 07/16/2016   Procedure: VIDEO BRONCHOSCOPY WITH ENDOBRONCHIAL ULTRASOUND with ultrasound transbronchial biopsy of node #7 and right lower lobe lesion;  Surgeon: Grace Isaac, MD;  [facility];  Service: Thoracic;  Laterality: N/A;    Family History: Family History  Problem Relation Age of Onset  . Heart disease Mother   . Heart disease Father   . COPD Sister   . HIV Brother     Social History: Social History   Socioeconomic History  . Marital status: Married    Spouse name: Not on file  . Number of children: 4  . Years of education: Not on file  . Highest education level: Not on file  Social Needs  . Financial resource strain: Not on file  . Food insecurity - worry: Not on file  . Food insecurity - inability: Not on file  . Transportation needs - medical: Not on file  . Transportation needs - non-medical: Not on file  Occupational History  . Occupation: [employer]    Employer: [employer]  Tobacco Use  . Smoking status: Current Every Day Smoker    Packs/day: 0.50    Years: 35.00    Pack years: 17.50    Types: Cigarettes  . Smokeless tobacco: Never Used  Substance and Sexual Activity  . Alcohol use: Yes    Comment: occasionally, Couple drinks per mo  . Drug use: Yes    Frequency: 1.0 times per week    Types: Marijuana    Comment: weekly  . Sexual activity: Yes    Birth control/protection: Surgical    Comment: married  Other Topics Concern  . Not on file  Social History Narrative   Lives w/ husband    Allergies: No Known Allergies  Medications:  Current Outpatient Medications  Medication Sig Dispense Refill  . amLODipine (NORVASC) 5 MG tablet Take 5 mg by mouth daily.  3  . IPILIMUMAB IV  Inject every 21 ( twenty-one) days into the vein.    Marland Kitchen loratadine (CLARITIN) 10 MG tablet Take 1 tablet (10 mg total) by mouth daily as needed. (Patient taking differently: Take 10 mg by mouth daily. ) 30 tablet 3  . NIVOLUMAB IV Inject every 21 ( twenty-one) days into the vein.    Marland Kitchen zolpidem (AMBIEN) 10 MG tablet Take 10  mg by mouth at bedtime as needed. for sleep  3  . dexamethasone (DECADRON) 2 MG tablet Take 1 tablet (2 mg total) by mouth 2 (two) times daily with a meal. 60 tablet 1  . DULoxetine (CYMBALTA) 60 MG capsule Take 1 capsule (60 mg total) by mouth daily. 30 capsule 3   No current facility-administered medications for this visit.     Review of Systems: Review of Systems  All other systems reviewed and are negative.    PHYSICAL EXAMINATION Blood pressure 137/83, pulse 88, temperature 98.5 F (36.9 C), temperature source Oral, resp. rate 18, height 5' 5" (1.651 m), weight 131 lb (59.4 kg), SpO2 99 %.  ECOG PERFORMANCE STATUS: 2 - Symptomatic, <50% confined to bed  Physical Exam  Constitutional: She is oriented to person, place, and time. No distress.  HENT:  Head: Normocephalic and atraumatic.  Mouth/Throat: Oropharynx is clear and moist. No oropharyngeal exudate.  Eyes: Conjunctivae and EOM are normal. Pupils are equal, round, and reactive to light. No scleral icterus.  Neck: No thyromegaly present.  Cardiovascular: Normal rate, regular rhythm and normal heart sounds.  No murmur heard. Pulmonary/Chest: Effort normal and breath sounds normal. No respiratory distress. She has no wheezes. She has no rales.  Abdominal: Soft. Bowel sounds are normal. She exhibits no distension. There is no tenderness. There is no rebound.  Musculoskeletal: She exhibits no edema.  Lymphadenopathy:    She has no cervical adenopathy.  Neurological: She is alert and oriented to person, place, and time. She has normal reflexes. No cranial nerve deficit.  Skin: Skin is warm and dry. She is not  diaphoretic. No erythema.     LABORATORY DATA: I have personally reviewed the data as listed: Infusion on 11/16/2017  Component Date Value Ref Range Status  . TSH 11/16/2017 2.711  0.350 - 4.500 uIU/mL Final   Performed by a 3rd Generation assay with a functional sensitivity of <=0.01 uIU/mL.  Marland Kitchen Sodium 11/16/2017 135  135 - 145 mmol/L Final  . Potassium 11/16/2017 3.6  3.5 - 5.1 mmol/L Final  . Chloride 11/16/2017 99* 101 - 111 mmol/L Final  . CO2 11/16/2017 27  22 - 32 mmol/L Final  . Glucose, Bld 11/16/2017 87  65 - 99 mg/dL Final  . BUN 11/16/2017 20  6 - 20 mg/dL Final  . Creatinine, Ser 11/16/2017 0.63  0.44 - 1.00 mg/dL Final  . Calcium 11/16/2017 8.9  8.9 - 10.3 mg/dL Final  . Total Protein 11/16/2017 6.5  6.5 - 8.1 g/dL Final  . Albumin 11/16/2017 3.6  3.5 - 5.0 g/dL Final  . AST 11/16/2017 19  15 - 41 U/L Final  . ALT 11/16/2017 18  14 - 54 U/L Final  . Alkaline Phosphatase 11/16/2017 59  38 - 126 U/L Final  . Total Bilirubin 11/16/2017 0.6  0.3 - 1.2 mg/dL Final  . GFR calc non Af Amer 11/16/2017 >60  >60 mL/min Final  . GFR calc Af Amer 11/16/2017 >60  >60 mL/min Final   Comment: (NOTE) The eGFR has been calculated using the CKD EPI equation. This calculation has not been validated in all clinical situations. eGFR's persistently <60 mL/min signify possible Chronic Kidney Disease.   . Anion gap 11/16/2017 9  5 - 15 Final  . WBC 11/16/2017 5.8  4.0 - 10.5 K/uL Final  . RBC 11/16/2017 3.85* 3.87 - 5.11 MIL/uL Final  . Hemoglobin 11/16/2017 11.5* 12.0 - 15.0 g/dL Final  . HCT 11/16/2017 37.0  36.0 -  46.0 % Final  . MCV 11/16/2017 96.1  78.0 - 100.0 fL Final  . MCH 11/16/2017 29.9  26.0 - 34.0 pg Final  . MCHC 11/16/2017 31.1  30.0 - 36.0 g/dL Final  . RDW 11/16/2017 18.3* 11.5 - 15.5 % Final  . Platelets 11/16/2017 116* 150 - 400 K/uL Final   Comment: SPECIMEN CHECKED FOR CLOTS PLATELET COUNT CONFIRMED BY SMEAR   . Neutrophils Relative % 11/16/2017 85  % Final  .  Neutro Abs 11/16/2017 4.9  1.7 - 7.7 K/uL Final  . Lymphocytes Relative 11/16/2017 8  % Final  . Lymphs Abs 11/16/2017 0.5* 0.7 - 4.0 K/uL Final  . Monocytes Relative 11/16/2017 6  % Final  . Monocytes Absolute 11/16/2017 0.4  0.1 - 1.0 K/uL Final  . Eosinophils Relative 11/16/2017 1  % Final  . Eosinophils Absolute 11/16/2017 0.1  0.0 - 0.7 K/uL Final  . Basophils Relative 11/16/2017 0  % Final  . Basophils Absolute 11/16/2017 0.0  0.0 - 0.1 K/uL Final       Hilda Wexler G Kaycen [place], MD 

## 2017-11-25 NOTE — Progress Notes (Signed)
[facility] Follow-up Visit:  Assessment: Small cell lung cancer [facility]) 57 y.o. female with widely metastatic status of stage small cell lung cancer with known involvement of the brain and suspected involvement of the abdomen.  Currently undergoing palliative immunotherapy with combination of ipilimumab and nivolumab, having received 2 cycles of therapy.  Interval disease assessment with CT of the chest/abdomen/pelvis demonstrates positive response in the chest with decreased mediastinal and hilar lymphadenopathy as well as the pulmonary masses.  Nevertheless, there is new disease/progression appearing as multifocal skeletal lesions as well as progressive mass in the tail of the pancreas and a new lesion in the perinephric fat measuring 1.9 cm.  This may represent progression of a small cell lung cancer may present as a multi-clonal disease is 1 of the sub-clones not responsive to immunotherapy or a second malignancy scissoring the mass in the pancreas has been present for period of time and may represent pancreatic adenocarcinoma status small cell lung cancer.  Plan: -- Proceed with the third cycle of ipilimumab/nivolumab -- Consult gastroenterology for EGD/EUS and biopsy of the pancreatic tail lesion if possible.  If not, will consult interventional radiology for biopsy of the retroperitoneal perinephric mass.  Purposes to identify whether the abdominal and skeletal progression is related to the small cell lung cancer or due to another malignancy as this will dramatically change recommended therapies. -- Return to clinic in 1 week after the biopsy, we will likely need to add Denosumab to the treatment schedule due to appearance of skeletal disease.  Voice recognition software was used and creation of this note. Despite my best effort at editing the text, some misspelling/errors may have occurred.  Orders Placed This Encounter  Procedures  . CBC with Differential    Standing  Status:   Future    Standing Expiration Date:   11/16/2018  . Comprehensive metabolic panel    Standing Status:   Future    Standing Expiration Date:   11/16/2018  . Magnesium    Standing Status:   Future    Standing Expiration Date:   11/16/2018  . CA 19.9    Standing Status:   Future    Standing Expiration Date:   11/16/2018  . ACTH    Standing Status:   Future    Standing Expiration Date:   11/16/2018  . Cortisol    Standing Status:   Future    Standing Expiration Date:   11/16/2018  . TSH    Standing Status:   Future    Standing Expiration Date:   11/16/2018  . Ambulatory referral to Gastroenterology    Referral Priority:   Routine    Referral Type:   Consultation    Referral Reason:   Specialty Services Required    Referred to Provider:   Milus Banister, MD    Number of Visits Requested:   1    Cancer Staging No matching staging information was found for the patient.  All questions were answered.  . The patient knows to call the clinic with any problems, questions or concerns.  This note was electronically signed.    History of Presenting Illness Lydia Moore 57 y.o. presenting to the [place] for clinical monitoring for diagnosis of extensive stage small cell lung cancer with previous metastasis to the brain and the abdominal cavity.  Patient is currently undergoing palliative immunotherapy with combination of ipilimumab and nivolumab, and presents to the clinic to undergo the third cycle of systemic therapy.  Restaging imaging has been obtained prior to this visit.  At this time, patient reports feeling reasonably well.  Her respiratory symptoms have resolved.  She denies any new symptoms of abdominal pain.  No headaches, nausea, diarrhea, skin rash.  Oncological/hematological History:   Small cell lung cancer ([place])   07/12/2016 Imaging    CTA chest 3.9 x 3.5 cm right lower lobe mass with imaging features most compatible with a primary lung carcinoma.Marked metastatic  right hilar and mediastinal adenopathy including a confluent mass of adenopathy in the right hilum, encasing and causing marked narrowing of the central pulmonary arteries on the right with a short segment of occlusion of the right lower lobe bronchus.Mild postobstructive changes in the inferior, medial right lower lobe.Mild changes of COPD       07/16/2016 Procedure    Endoscopic bronchoscopy with transbronchial biopsy of #7 nodes and biopsy of RLL lung mass      07/16/2016 Pathology Results    Lung, biopsy, Right Lower Lobe - SMALL CELL CARCINOMA.       07/21/2016 Imaging    MRI brain Negative MRI of the brain. No evidence for metastatic disease to the brain or meninges.      07/26/2016 PET scan    Markedly hypermetabolic right lower lobe lesion with hypermetabolic metastatic disease in the right hilum and mediastinum. 2. Several scattered hypermetabolic ground-glass opacities in the right upper lobe. FDG accumulation within these nodules is higher than typically seen for infectious/inflammatory etiology although this remains within the differential. Atypical appearance of metastatic spread would also be a consideration. 3. Coronary artery atherosclerosis. 4. **An incidental finding of potential clinical significance has been found. 3.8 cm abdominal aortic aneurysm       07/27/2016 - 11/11/2016 Chemotherapy    Cisplatin/Etoposide x 6 cycles      11/09/2016 Treatment Plan Change    Patient declined/refused whole brain radiation.      11/23/2016 Imaging    CT chest with No new or progressive findings in the chest. 2. Further interval decrease in size of mediastinal lymphadenopathy. 3. Only trace scarring visible in the right lower lobe, at the location of the previous right lower lobe mass. 4. Coronary artery and thoracoabdominal aortic atherosclerosis      02/21/2017 Imaging    CT CAP- 1. Unfortunately the patient has had significant recurrence with marked mediastinal and  right infrahilar adenopathy as well as suspected masses in the collapsed right lower lobe. The confluent adenopathy completely occlude the right lower lobe and right middle lobe bronchi, although there is some air trapping in the right middle lobe ; the right lower lobe is completely collapsed around several right lower lobe masses. 2. There is some speckled density in the left anterior sixth rib which is not entirely specific but which is concerning for early osseous metastatic disease given that it was not present on 11/23/2016. Consider a bone scan to further assess the skeleton for other occult bony spread. 3. Infrarenal abdominal aortic aneurysm 4.2 cm in diameter, increased from the prior measurement of 3.8 cm on 07/26/2016. Although this is only minor increase, it has occurred in a 7 month period. The patient has a previous existing relationship of cardiothoracic surgery and surveillance of this aneurysm by surgery is appropriate. 4. Other imaging findings of potential clinical significance: Coronary, aortic arch, and branch vessel atherosclerotic vascular disease. Mild lumbar spondylosis and degenerative disc disease.      02/22/2017 Relapse/Recurrence    Last Treatment Date: 11/11/2016 Recent Lab  Values: Recent Lab Values: N/A      02/28/2017 -  Chemotherapy    Topotecan       03/08/2017 Imaging    MRI brain- 1. No evidence of metastatic disease. 2. Intermittently motion degraded.      03/08/2017 Imaging    Bone scan: IMPRESSION: No evidence skeletal metastasis by bone scintigraphy.      03/10/2017 Miscellaneous    Consult at [place] with Dr. Aniceto Boss:  Following Topotecan therapy, she may be a candidate for immunotherapy at time of progression/relapse, with Nivolumab versus Nivolumab/Ipilumumab.  Response rates with single-agent immunotherapy is ~ 10% and with combo therapy 20-30%.  Responses are durable.  Rate of side effects from therapy are ~ 5% and 20-30%.  This  therapy is not yet FDA approved and therefore if this therapy option is approved, prior approval from insurance company will be needed.  Dr. Aniceto Boss favors the combination therapy.  Additionally, she may be eligible for a clinical trial (ie Rova-T + nivolumab/ipilumumab).      04/07/2017 Imaging    CT CAP- 1. Persistent but improved bulky mediastinal and right hilar/infrahilar lymphadenopathy and significant decrease in size of the right lower lobe mass and obstructive pneumonitis. Residual branching endobronchial tumor in the right lower lobe. 2. No findings for pulmonary metastatic nodules or abdominal/pelvic lymphadenopathy. 3. Stable infrarenal abdominal aortic aneurysm.      04/12/2017 Treatment Plan Change    Tx deferred x 1 week due to hyponatremia requiring work-up.      08/19/2017 Imaging    MRI Brain w/ and w/o contrast: IMPRESSION: New 4 x 8 mm metastasis at the medial left posterior frontal lobe. Three other questionable small metastases, 1 at the posterolateral inferior left parietal lobe, 1 in the right posterior parietal white matter, and 1 in the left frontal region. These cannot be confirmed on multiple sequences. Therefore, it may be worthwhile obtaining a 3 tesla examination, particularly if stereotactic radiotherapy is considered.      08/19/2017 Imaging    CT C/A/P: IMPRESSION: 1. Right lower lobe lung mass is increased since 08/04/2017 and significantly increased since 04/07/2017, with new occlusion of the right lower lobe bronchus and complete right lower lobe atelectasis. 2. Bulky ipsilateral hilar and right paratracheal and subcarinal mediastinal adenopathy, stable since 08/04/2017 and significantly increased since 04/07/2017. 3. New hypodense pancreatic tail mass, most compatible with pancreatic metastasis . 4. No additional sites of metastatic disease in the chest, abdomen or pelvis. 5. New trace dependent right pleural effusion.  Increased interlobular septal thickening in the right middle lobe, favor localized pulmonary edema, cannot exclude lymphangitic tumor spread. 6. Small pericardial effusion, stable since 08/04/2017, new since 04/07/2017 . 7. Three-vessel coronary atherosclerosis. 8. Aortic Atherosclerosis (ICD10-I70.0). Mildly increased 4.4 cm Abdominal Aortic Aneurysm (ICD10-I71.9). Recommend follow-up aortic ultrasound in 1 year. This recommendation follows ACR consensus guidelines: White Paper of the ACR Incidental Findings Committee II on Vascular Findings. J Am Coll Radiol 2013; 10:789-794. 9. Mild colonic diverticulosis.       08/19/2017 Progression    New brain mets, enlarging right lower lobe mass, bulky chest lymphadenopathy, new pancreatic tail lesion      08/29/2017 - 09/09/2017 Radiation Therapy    Under the care of Dr. Lisbeth Renshaw   Radiation treatment dates:   08/29/2017 - 09/09/2017  Site/dose:   The brain and right lung were each treated to 30 Gy in 10 fractions of 3 Gy.        11/11/2017 Imaging    CT  C/A/P: Interval decrease in size of mediastinal and right hilar lymphadenopathy down to 0.8 cm.  Decreased right middle lobe and right lower lobe mass lesions.  Unfortunately, multiple new skeletal lesions are noted including pathological fracture of the second rib on the right.  Mass in the tail of the pancreas is increasing in size now measuring 6.7 cm.  Additional new metastatic lesion in the perinephric fat measuring 1.9 cm.       Medical History: Past Medical History:  Diagnosis Date  . Elevated cholesterol   . GERD (gastroesophageal reflux disease)   . GI bleed   . Goals of care, counseling/discussion 03/21/2017  . Hypertension   . 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 Small cell carcinoma ([place]) 07/21/2016    Surgical History: Past Surgical History:  Procedure Laterality Date  . ABDOMINAL HYSTERECTOMY     partial  . CESAREAN SECTION    .  COLONOSCOPY    . PORTACATH PLACEMENT Left 08/04/2016   Procedure: INSERTION PORT-A-CATH LEFT SUBCLAVIAN;  Surgeon: Aviva Signs, MD;  Location: [facility];  Service: General;  Laterality: Left;  . TONSILLECTOMY    . VIDEO BRONCHOSCOPY WITH ENDOBRONCHIAL ULTRASOUND N/A 07/16/2016   Procedure: VIDEO BRONCHOSCOPY WITH ENDOBRONCHIAL ULTRASOUND with ultrasound transbronchial biopsy of node #7 and right lower lobe lesion;  Surgeon: Grace Isaac, MD;  [facility];  Service: Thoracic;  Laterality: N/A;    Family History: Family History  Problem Relation Age of Onset  . Heart disease Mother   . Heart disease Father   . COPD Sister   . HIV Brother     Social History: Social History   Socioeconomic History  . Marital status: Married    Spouse name: Not on file  . Number of children: 4  . Years of education: Not on file  . Highest education level: Not on file  Social Needs  . Financial resource strain: Not on file  . Food insecurity - worry: Not on file  . Food insecurity - inability: Not on file  . Transportation needs - medical: Not on file  . Transportation needs - non-medical: Not on file  Occupational History  . Occupation: [employer]    Employer: [employer]  Tobacco Use  . Smoking status: Current Every Day Smoker    Packs/day: 0.50    Years: 35.00    Pack years: 17.50    Types: Cigarettes  . Smokeless tobacco: Never Used  Substance and Sexual Activity  . Alcohol use: Yes    Comment: occasionally, Couple drinks per mo  . Drug use: Yes    Frequency: 1.0 times per week    Types: Marijuana    Comment: weekly  . Sexual activity: Yes    Birth control/protection: Surgical    Comment: married  Other Topics Concern  . Not on file  Social History Narrative   Lives w/ husband    Allergies: No Known Allergies  Medications:  Current Outpatient Medications  Medication Sig Dispense Refill  . amLODipine (NORVASC) 5 MG tablet Take 5 mg by mouth daily.  3  . IPILIMUMAB IV  Inject every 21 ( twenty-one) days into the vein.    Marland Kitchen loratadine (CLARITIN) 10 MG tablet Take 1 tablet (10 mg total) by mouth daily as needed. (Patient taking differently: Take 10 mg by mouth daily. ) 30 tablet 3  . NIVOLUMAB IV Inject every 21 ( twenty-one) days into the vein.    Marland Kitchen zolpidem (AMBIEN) 10 MG tablet Take 10  mg by mouth at bedtime as needed. for sleep  3  . dexamethasone (DECADRON) 2 MG tablet Take 1 tablet (2 mg total) by mouth 2 (two) times daily with a meal. 60 tablet 1  . DULoxetine (CYMBALTA) 60 MG capsule Take 1 capsule (60 mg total) by mouth daily. 30 capsule 3   No current facility-administered medications for this visit.     Review of Systems: Review of Systems  All other systems reviewed and are negative.    PHYSICAL EXAMINATION Blood pressure 137/83, pulse 88, temperature 98.5 F (36.9 C), temperature source Oral, resp. rate 18, height 5' 5" (1.651 m), weight 131 lb (59.4 kg), SpO2 99 %.  ECOG PERFORMANCE STATUS: 2 - Symptomatic, <50% confined to bed  Physical Exam  Constitutional: She is oriented to person, place, and time. No distress.  HENT:  Head: Normocephalic and atraumatic.  Mouth/Throat: Oropharynx is clear and moist. No oropharyngeal exudate.  Eyes: Conjunctivae and EOM are normal. Pupils are equal, round, and reactive to light. No scleral icterus.  Neck: No thyromegaly present.  Cardiovascular: Normal rate, regular rhythm and normal heart sounds.  No murmur heard. Pulmonary/Chest: Effort normal and breath sounds normal. No respiratory distress. She has no wheezes. She has no rales.  Abdominal: Soft. Bowel sounds are normal. She exhibits no distension. There is no tenderness. There is no rebound.  Musculoskeletal: She exhibits no edema.  Lymphadenopathy:    She has no cervical adenopathy.  Neurological: She is alert and oriented to person, place, and time. She has normal reflexes. No cranial nerve deficit.  Skin: Skin is warm and dry. She is not  diaphoretic. No erythema.     LABORATORY DATA: I have personally reviewed the data as listed: Infusion on 11/16/2017  Component Date Value Ref Range Status  . TSH 11/16/2017 2.711  0.350 - 4.500 uIU/mL Final   Performed by a 3rd Generation assay with a functional sensitivity of <=0.01 uIU/mL.  Marland Kitchen Sodium 11/16/2017 135  135 - 145 mmol/L Final  . Potassium 11/16/2017 3.6  3.5 - 5.1 mmol/L Final  . Chloride 11/16/2017 99* 101 - 111 mmol/L Final  . CO2 11/16/2017 27  22 - 32 mmol/L Final  . Glucose, Bld 11/16/2017 87  65 - 99 mg/dL Final  . BUN 11/16/2017 20  6 - 20 mg/dL Final  . Creatinine, Ser 11/16/2017 0.63  0.44 - 1.00 mg/dL Final  . Calcium 11/16/2017 8.9  8.9 - 10.3 mg/dL Final  . Total Protein 11/16/2017 6.5  6.5 - 8.1 g/dL Final  . Albumin 11/16/2017 3.6  3.5 - 5.0 g/dL Final  . AST 11/16/2017 19  15 - 41 U/L Final  . ALT 11/16/2017 18  14 - 54 U/L Final  . Alkaline Phosphatase 11/16/2017 59  38 - 126 U/L Final  . Total Bilirubin 11/16/2017 0.6  0.3 - 1.2 mg/dL Final  . GFR calc non Af Amer 11/16/2017 >60  >60 mL/min Final  . GFR calc Af Amer 11/16/2017 >60  >60 mL/min Final   Comment: (NOTE) The eGFR has been calculated using the CKD EPI equation. This calculation has not been validated in all clinical situations. eGFR's persistently <60 mL/min signify possible Chronic Kidney Disease.   . Anion gap 11/16/2017 9  5 - 15 Final  . WBC 11/16/2017 5.8  4.0 - 10.5 K/uL Final  . RBC 11/16/2017 3.85* 3.87 - 5.11 MIL/uL Final  . Hemoglobin 11/16/2017 11.5* 12.0 - 15.0 g/dL Final  . HCT 11/16/2017 37.0  36.0 -  46.0 % Final  . MCV 11/16/2017 96.1  78.0 - 100.0 fL Final  . MCH 11/16/2017 29.9  26.0 - 34.0 pg Final  . MCHC 11/16/2017 31.1  30.0 - 36.0 g/dL Final  . RDW 11/16/2017 18.3* 11.5 - 15.5 % Final  . Platelets 11/16/2017 116* 150 - 400 K/uL Final   Comment: SPECIMEN CHECKED FOR CLOTS PLATELET COUNT CONFIRMED BY SMEAR   . Neutrophils Relative % 11/16/2017 85  % Final  .  Neutro Abs 11/16/2017 4.9  1.7 - 7.7 K/uL Final  . Lymphocytes Relative 11/16/2017 8  % Final  . Lymphs Abs 11/16/2017 0.5* 0.7 - 4.0 K/uL Final  . Monocytes Relative 11/16/2017 6  % Final  . Monocytes Absolute 11/16/2017 0.4  0.1 - 1.0 K/uL Final  . Eosinophils Relative 11/16/2017 1  % Final  . Eosinophils Absolute 11/16/2017 0.1  0.0 - 0.7 K/uL Final  . Basophils Relative 11/16/2017 0  % Final  . Basophils Absolute 11/16/2017 0.0  0.0 - 0.1 K/uL Final       Ardath Sax, MD

## 2017-11-29 ENCOUNTER — Other Ambulatory Visit: Payer: Self-pay | Admitting: Radiation Therapy

## 2017-11-29 DIAGNOSIS — C7949 Secondary malignant neoplasm of other parts of nervous system: Principal | ICD-10-CM

## 2017-11-29 DIAGNOSIS — C7931 Secondary malignant neoplasm of brain: Secondary | ICD-10-CM

## 2017-12-01 ENCOUNTER — Ambulatory Visit (HOSPITAL_COMMUNITY): Payer: Managed Care, Other (non HMO) | Admitting: Anesthesiology

## 2017-12-01 ENCOUNTER — Other Ambulatory Visit: Payer: Self-pay

## 2017-12-01 ENCOUNTER — Encounter (HOSPITAL_COMMUNITY)
Admission: RE | Disposition: A | Discharge status: Home / Self Care | Payer: Self-pay | Source: Ambulatory Visit | Attending: Gastroenterology

## 2017-12-01 ENCOUNTER — Ambulatory Visit (HOSPITAL_COMMUNITY)
Admission: RE | Admit: 2017-12-01 | Discharge: 2017-12-01 | Disposition: A | Discharge status: Home / Self Care | Payer: Managed Care, Other (non HMO) | Source: Ambulatory Visit | Attending: Gastroenterology | Admitting: Gastroenterology

## 2017-12-01 ENCOUNTER — Encounter (HOSPITAL_COMMUNITY): Payer: Self-pay

## 2017-12-01 DIAGNOSIS — K8689 Other specified diseases of pancreas: Secondary | ICD-10-CM

## 2017-12-01 DIAGNOSIS — Z8249 Family history of ischemic heart disease and other diseases of the circulatory system: Secondary | ICD-10-CM | POA: Insufficient documentation

## 2017-12-01 DIAGNOSIS — E7800 Pure hypercholesterolemia, unspecified: Secondary | ICD-10-CM | POA: Insufficient documentation

## 2017-12-01 DIAGNOSIS — K869 Disease of pancreas, unspecified: Secondary | ICD-10-CM

## 2017-12-01 DIAGNOSIS — C252 Malignant neoplasm of tail of pancreas: Secondary | ICD-10-CM | POA: Insufficient documentation

## 2017-12-01 DIAGNOSIS — Z79899 Other long term (current) drug therapy: Secondary | ICD-10-CM | POA: Diagnosis not present

## 2017-12-01 DIAGNOSIS — C7931 Secondary malignant neoplasm of brain: Secondary | ICD-10-CM | POA: Insufficient documentation

## 2017-12-01 DIAGNOSIS — F17210 Nicotine dependence, cigarettes, uncomplicated: Secondary | ICD-10-CM | POA: Diagnosis not present

## 2017-12-01 DIAGNOSIS — K219 Gastro-esophageal reflux disease without esophagitis: Secondary | ICD-10-CM | POA: Insufficient documentation

## 2017-12-01 DIAGNOSIS — C3431 Malignant neoplasm of lower lobe, right bronchus or lung: Secondary | ICD-10-CM | POA: Insufficient documentation

## 2017-12-01 DIAGNOSIS — I10 Essential (primary) hypertension: Secondary | ICD-10-CM | POA: Diagnosis not present

## 2017-12-01 DIAGNOSIS — Z9221 Personal history of antineoplastic chemotherapy: Secondary | ICD-10-CM | POA: Diagnosis not present

## 2017-12-01 HISTORY — PX: EUS: SHX5427

## 2017-12-01 SURGERY — UPPER ENDOSCOPIC ULTRASOUND (EUS) RADIAL
Anesthesia: Monitor Anesthesia Care

## 2017-12-01 MED ORDER — PROPOFOL 10 MG/ML IV BOLUS
INTRAVENOUS | Status: AC
  Filled 2017-12-01: qty 40

## 2017-12-01 MED ORDER — HEPARIN SOD (PORK) LOCK FLUSH 100 UNIT/ML IV SOLN
500.0000 [IU] | Freq: Once | INTRAVENOUS | Status: AC
  Administered 2017-12-01: 500 [IU] via INTRAVENOUS

## 2017-12-01 MED ORDER — HEPARIN SOD (PORK) LOCK FLUSH 100 UNIT/ML IV SOLN
INTRAVENOUS | Status: AC
Start: 2017-12-01 — End: ?
  Filled 2017-12-01: qty 5

## 2017-12-01 MED ORDER — SODIUM CHLORIDE 0.9 % IV SOLN: INTRAVENOUS | Status: DC

## 2017-12-01 MED ORDER — PROPOFOL 10 MG/ML IV BOLUS
INTRAVENOUS | Status: DC | PRN
  Administered 2017-12-01: 10 mg via INTRAVENOUS
  Administered 2017-12-01: 20 mg via INTRAVENOUS
  Administered 2017-12-01: 30 mg via INTRAVENOUS
  Administered 2017-12-01 (×2): 20 mg via INTRAVENOUS
  Administered 2017-12-01: 10 mg via INTRAVENOUS
  Administered 2017-12-01 (×8): 20 mg via INTRAVENOUS

## 2017-12-01 MED ORDER — LACTATED RINGERS IV SOLN
INTRAVENOUS | Status: DC
  Administered 2017-12-01: 13:00:00 via INTRAVENOUS

## 2017-12-01 NOTE — Transfer of Care (Signed)
Immediate Anesthesia Transfer of Care Note  Patient: Lydia Moore  Procedure(s) Performed: UPPER ENDOSCOPIC ULTRASOUND (EUS) RADIAL (N/A )  Patient Location: PACU  Anesthesia Type:MAC  Level of Consciousness: sedated  Airway & Oxygen Therapy: Patient Spontanous Breathing and Patient connected to nasal cannula oxygen  Post-op Assessment: Report given to RN and Post -op Vital signs reviewed and stable  Post vital signs: Reviewed and stable  Last Vitals:  Vitals:   12/01/17 1228  BP: (!) 133/98  Pulse: 89  Resp: (!) 24  Temp: 37.6 C  SpO2: 95%    Last Pain:  Vitals:   12/01/17 1228  TempSrc: Oral         Complications: No apparent anesthesia complications

## 2017-12-01 NOTE — Op Note (Signed)
[facility] Patient Name: Lydia Moore Procedure Date: 12/01/2017 MRN: [id number] Attending MD: Milus Banister , MD Date of Birth: August 10, 1961 CSN: [id number] Age: 57 Admit Type: Outpatient Procedure:                Upper EUS Indications:              Known widely metastatic Small Cell Lung Cancer;                            tumors with mixed response to chemotherapy; rapidly                            enlarging pancreatic mass Providers:                Milus Banister, MD, Presley Raddle, RN, Elspeth Cho Tech., Technician, Derrek Gu. Alday CRNA,                            CRNA Referring MD:             Grace Isaac, MD Medicines:                Monitored Anesthesia Care Complications:            No immediate complications. Estimated blood loss:                            None. Estimated Blood Loss:     Estimated blood loss: none. Procedure:                Pre-Anesthesia Assessment:                           - Prior to the procedure, a History and Physical                            was performed, and patient medications and                            allergies were reviewed. The patient's tolerance of                            previous anesthesia was also reviewed. The risks                            and benefits of the procedure and the sedation                            options and risks were discussed with the patient.                            All questions were answered, and informed consent                            was obtained.  Prior Anticoagulants: The patient has                            taken no previous anticoagulant or antiplatelet                            agents. ASA Grade Assessment: III - A patient with                            severe systemic disease. After reviewing the risks                            and benefits, the patient was deemed in                            satisfactory condition to undergo the  procedure.                           After obtaining informed consent, the endoscope was                            passed under direct vision. Throughout the                            procedure, the patient's blood pressure, pulse, and                            oxygen saturations were monitored continuously. The                            [id number] ([id number]) scope was introduced through                            the mouth, and advanced to the duodenal bulb. The                            upper EUS was accomplished without difficulty. The                            patient tolerated the procedure well. Findings:      Endoscopic Finding :      1. Limited evaluation showed normal UGI tract.      Endosonographic Finding :      1. A large, round mass was identified involving the pancreatic tail. The       mass was hypoechoic and heterogenous. The mass measured 8.7cm in maximal       cross-sectional diameter. The endosonographic borders were well-defined.       Fine needle aspiration for cytology was performed. Color Doppler imaging       was utilized prior to needle puncture to confirm a lack of significant       vascular structures within the needle path. One pass was made with the       22 gauge needle using a transgastric approach. A stylet was used. A       cytotechnologist was present to  evaluate the adequacy of the specimen. Impression:               - Large mass involving the tail of the pancreas,                            sampled with FNA.                           - Preliminary cytology is positive for malignancy                            (small cell vs. lymphoma). Await final testing. Moderate Sedation:      N/A- Per Anesthesia Care Recommendation:           - Discharge patient to home (ambulatory). Procedure Code(s):        --- Professional ---                           [phone], Esophagogastroduodenoscopy, flexible,                            transoral; with transendoscopic  ultrasound-guided                            intramural or transmural fine needle                            aspiration/biopsy(s), (includes endoscopic                            ultrasound examination limited to the esophagus,                            stomach or duodenum, and adjacent structures) Diagnosis Code(s):        --- Professional ---                           K86.89, Other specified diseases of pancreas                           R93.3, Abnormal findings on diagnostic imaging of                            other parts of digestive tract CPT copyright 2016 [employer]. All rights reserved. The codes documented in this report are preliminary and upon coder review may  be revised to meet current compliance requirements. Milus Banister, MD 12/01/2017 1:57:30 PM This report has been signed electronically. Number of Addenda: 0

## 2017-12-01 NOTE — Anesthesia Postprocedure Evaluation (Signed)
Anesthesia Post Note  Patient: Lydia Moore  Procedure(s) Performed: UPPER ENDOSCOPIC ULTRASOUND (EUS) RADIAL (N/A )     Patient location during evaluation: PACU Anesthesia Type: MAC Level of consciousness: awake and alert and oriented Pain management: pain level controlled Vital Signs Assessment: post-procedure vital signs reviewed and stable Respiratory status: spontaneous breathing, nonlabored ventilation and respiratory function stable Cardiovascular status: stable and blood pressure returned to baseline Postop Assessment: no apparent nausea or vomiting Anesthetic complications: no    Last Vitals:  Vitals:   12/01/17 1228 12/01/17 1400  BP: (!) 133/98 123/80  Pulse: 89 84  Resp: (!) 24 (!) 28  Temp: 37.6 C   SpO2: 95% 96%    Last Pain:  Vitals:   12/01/17 1228  TempSrc: Oral                 Syleena Mchan A.

## 2017-12-01 NOTE — Anesthesia Preprocedure Evaluation (Signed)
Anesthesia Evaluation  Patient identified by MRN, date of birth, ID band Patient awake    Reviewed: Allergy & Precautions, NPO status , Patient's Chart, lab work & pertinent test results  Airway Mallampati: II  TM Distance: >3 FB Neck ROM: Full    Dental no notable dental hx. 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  Neuromuscular disease negative psych ROS   GI/Hepatic Neg liver ROS, GERD  Controlled and Medicated,Pancreatic mass   Endo/Other  negative endocrine ROSHyperlipidemia  Renal/GU negative Renal ROS  negative genitourinary   Musculoskeletal negative musculoskeletal ROS (+)   Abdominal   Peds  Hematology   Anesthesia Other Findings   Reproductive/Obstetrics                             Anesthesia Physical Anesthesia Plan  ASA: II  Anesthesia Plan: MAC   Post-op Pain Management:    Induction: Intravenous  PONV Risk Score and Plan: 1 and Treatment may vary due to age or medical condition, Ondansetron and Propofol infusion  Airway Management Planned: Natural Airway and Nasal Cannula  Additional Equipment:   Intra-op Plan:   Post-operative Plan:   Informed Consent: I have reviewed the patients History and Physical, chart, labs and discussed the procedure including the risks, benefits and alternatives for the proposed anesthesia with the patient or authorized representative who has indicated his/her understanding and acceptance.   Dental advisory given  Plan Discussed with: Anesthesiologist, CRNA and Surgeon  Anesthesia Plan Comments:         Anesthesia Quick Evaluation

## 2017-12-01 NOTE — Interval H&P Note (Signed)
History and Physical Interval Note:  12/01/2017 3:56 PM  Lydia Moore  has presented today for surgery, with the diagnosis of pancreatic mass  The various methods of treatment have been discussed with the patient and family. After consideration of risks, benefits and other options for treatment, the patient has consented to  Procedure(s): UPPER ENDOSCOPIC ULTRASOUND (EUS) RADIAL (N/A) as a surgical intervention .  The patient's history has been reviewed, patient examined, no change in status, stable for surgery.  I have reviewed the patient's chart and labs.  Questions were answered to the patient's satisfaction.     Milus Banister

## 2017-12-01 NOTE — Discharge Instructions (Signed)

## 2017-12-02 ENCOUNTER — Encounter (HOSPITAL_COMMUNITY): Payer: Self-pay | Admitting: Gastroenterology

## 2017-12-07 ENCOUNTER — Encounter (HOSPITAL_COMMUNITY): Payer: Self-pay | Admitting: Internal Medicine

## 2017-12-07 ENCOUNTER — Inpatient Hospital Stay (HOSPITAL_BASED_OUTPATIENT_CLINIC_OR_DEPARTMENT_OTHER): Payer: Managed Care, Other (non HMO) | Admitting: Internal Medicine

## 2017-12-07 ENCOUNTER — Telehealth (HOSPITAL_COMMUNITY): Payer: Self-pay | Admitting: *Deleted

## 2017-12-07 ENCOUNTER — Ambulatory Visit (HOSPITAL_COMMUNITY): Payer: Managed Care, Other (non HMO) | Admitting: Adult Health

## 2017-12-07 ENCOUNTER — Inpatient Hospital Stay (HOSPITAL_COMMUNITY): Payer: Managed Care, Other (non HMO)

## 2017-12-07 VITALS — BP 126/90 | HR 95 | Temp 98.4°F | Resp 18 | Wt 130.8 lb

## 2017-12-07 DIAGNOSIS — C3431 Malignant neoplasm of lower lobe, right bronchus or lung: Secondary | ICD-10-CM

## 2017-12-07 DIAGNOSIS — E876 Hypokalemia: Secondary | ICD-10-CM | POA: Diagnosis not present

## 2017-12-07 DIAGNOSIS — C3490 Malignant neoplasm of unspecified part of unspecified bronchus or lung: Secondary | ICD-10-CM

## 2017-12-07 DIAGNOSIS — Z720 Tobacco use: Secondary | ICD-10-CM

## 2017-12-07 DIAGNOSIS — Z5112 Encounter for antineoplastic immunotherapy: Secondary | ICD-10-CM | POA: Diagnosis not present

## 2017-12-07 DIAGNOSIS — C7951 Secondary malignant neoplasm of bone: Secondary | ICD-10-CM

## 2017-12-07 DIAGNOSIS — Z79899 Other long term (current) drug therapy: Secondary | ICD-10-CM

## 2017-12-07 DIAGNOSIS — C7931 Secondary malignant neoplasm of brain: Secondary | ICD-10-CM | POA: Diagnosis not present

## 2017-12-07 LAB — CBC WITH DIFFERENTIAL/PLATELET
BASOS PCT: 0 %
Basophils Absolute: 0 10*3/uL (ref 0.0–0.1)
EOS PCT: 1 %
Eosinophils Absolute: 0.1 10*3/uL (ref 0.0–0.7)
HCT: 35.7 % — ABNORMAL LOW (ref 36.0–46.0)
Hemoglobin: 11.5 g/dL — ABNORMAL LOW (ref 12.0–15.0)
Lymphocytes Relative: 8 %
Lymphs Abs: 0.6 10*3/uL — ABNORMAL LOW (ref 0.7–4.0)
MCH: 30.4 pg (ref 26.0–34.0)
MCHC: 32.2 g/dL (ref 30.0–36.0)
MCV: 94.4 fL (ref 78.0–100.0)
MONO ABS: 0.1 10*3/uL (ref 0.1–1.0)
Monocytes Relative: 1 %
Neutro Abs: 6.2 10*3/uL (ref 1.7–7.7)
Neutrophils Relative %: 90 %
PLATELETS: 113 10*3/uL — AB (ref 150–400)
RBC: 3.78 MIL/uL — AB (ref 3.87–5.11)
RDW: 16.6 % — AB (ref 11.5–15.5)
WBC: 6.9 10*3/uL (ref 4.0–10.5)

## 2017-12-07 LAB — CORTISOL: CORTISOL PLASMA: 17.6 ug/dL

## 2017-12-07 LAB — COMPREHENSIVE METABOLIC PANEL
ALBUMIN: 3.7 g/dL (ref 3.5–5.0)
ALK PHOS: 53 U/L (ref 38–126)
ALT: 16 U/L (ref 14–54)
ANION GAP: 11 (ref 5–15)
AST: 23 U/L (ref 15–41)
BILIRUBIN TOTAL: 1.2 mg/dL (ref 0.3–1.2)
BUN: 25 mg/dL — AB (ref 6–20)
CALCIUM: 8.7 mg/dL — AB (ref 8.9–10.3)
CO2: 26 mmol/L (ref 22–32)
Chloride: 96 mmol/L — ABNORMAL LOW (ref 101–111)
Creatinine, Ser: 0.82 mg/dL (ref 0.44–1.00)
GFR calc Af Amer: >60 mL/min (ref >60)
GLUCOSE: 85 mg/dL (ref 65–99)
Potassium: 3.1 mmol/L — ABNORMAL LOW (ref 3.5–5.1)
Sodium: 133 mmol/L — ABNORMAL LOW (ref 135–145)
TOTAL PROTEIN: 6.6 g/dL (ref 6.5–8.1)

## 2017-12-07 LAB — MAGNESIUM: Magnesium: 1.8 mg/dL (ref 1.7–2.4)

## 2017-12-07 LAB — TSH: TSH: 2.651 u[IU]/mL (ref 0.350–4.500)

## 2017-12-07 MED ORDER — SENNA 8.6 MG PO TABS: 1.0000 | ORAL_TABLET | Freq: Every day | ORAL | 0 refills | Status: AC

## 2017-12-07 MED ORDER — POTASSIUM CHLORIDE CRYS ER 20 MEQ PO TBCR: 20.0000 meq | EXTENDED_RELEASE_TABLET | Freq: Two times a day (BID) | ORAL | 1 refills | Status: AC

## 2017-12-07 MED ORDER — HEPARIN SOD (PORK) LOCK FLUSH 100 UNIT/ML IV SOLN
500.0000 [IU] | Freq: Once | INTRAVENOUS | Status: AC | PRN
  Administered 2017-12-07: 500 [IU]

## 2017-12-07 MED ORDER — DENOSUMAB 120 MG/1.7ML ~~LOC~~ SOLN
120.0000 mg | Freq: Once | SUBCUTANEOUS | Status: AC
  Administered 2017-12-07: 120 mg via SUBCUTANEOUS
  Filled 2017-12-07: qty 1.7

## 2017-12-07 MED ORDER — SODIUM CHLORIDE 0.9 % IV SOLN
140.0000 mg | Freq: Once | INTRAVENOUS | Status: AC
  Administered 2017-12-07: 140 mg via INTRAVENOUS
  Filled 2017-12-07: qty 4

## 2017-12-07 MED ORDER — MORPHINE SULFATE 15 MG PO TABS: 15.0000 mg | ORAL_TABLET | ORAL | 0 refills | Status: DC | PRN

## 2017-12-07 MED ORDER — SODIUM CHLORIDE 0.9 % IV SOLN
1.0000 mg/kg | Freq: Once | INTRAVENOUS | Status: AC
  Administered 2017-12-07: 50 mg via INTRAVENOUS
  Filled 2017-12-07: qty 10

## 2017-12-07 MED ORDER — SODIUM CHLORIDE 0.9 % IV SOLN
Freq: Once | INTRAVENOUS | Status: AC
  Administered 2017-12-07: 14:00:00 via INTRAVENOUS

## 2017-12-07 MED ORDER — ONDANSETRON 8 MG PO TBDP
ORAL_TABLET | ORAL | Status: AC
  Filled 2017-12-07: qty 1

## 2017-12-07 MED ORDER — MORPHINE SULFATE (PF) 2 MG/ML IV SOLN
2.0000 mg | Freq: Once | INTRAVENOUS | Status: AC
  Administered 2017-12-07: 2 mg via INTRAVENOUS

## 2017-12-07 MED ORDER — ONDANSETRON 8 MG PO TBDP
8.0000 mg | ORAL_TABLET | Freq: Once | ORAL | Status: AC
  Administered 2017-12-07: 8 mg via ORAL

## 2017-12-07 MED ORDER — MORPHINE SULFATE (PF) 2 MG/ML IV SOLN
INTRAVENOUS | Status: AC
  Filled 2017-12-07: qty 1

## 2017-12-07 NOTE — Treatment Plan (Signed)
Ok to keep doses at 50 mg ipilumumab and 140 mg nivolumab today per Dr Bangladesh

## 2017-12-07 NOTE — Patient Instructions (Signed)
[place] at [facility] Discharge Instructions  RECOMMENDATIONS MADE BY THE CONSULTANT AND ANY TEST RESULTS WILL BE SENT TO YOUR REFERRING PHYSICIAN.  Senn by Dr. Sherrine Maples today. Follow-up as scheduled. Call clinic for any questions or concerns  Thank you for choosing [place] at [facility] to provide your oncology and hematology care.  To afford each patient quality time with our provider, please arrive at least 15 minutes before your scheduled appointment time.    If you have a lab appointment with the [facility] please come in thru the  Main Entrance and check in at the main information desk  You need to re-schedule your appointment should you arrive 10 or more minutes late.  We strive to give you quality time with our providers, and arriving late affects you and other patients whose appointments are after yours.  Also, if you no show three or more times for appointments you may be dismissed from the clinic at the providers discretion.     Again, thank you for choosing [facility].  Our hope is that these requests will decrease the amount of time that you wait before being seen by our physicians.       _____________________________________________________________  Should you have questions after your visit to [facility], please contact our office at [phone] between the hours of 8:30 a.m. and 4:30 p.m.  Voicemails left after 4:30 p.m. will not be returned until the following business day.  For prescription refill requests, have your pharmacy contact our office.       Resources For Cancer Patients and their Caregivers ? American Cancer Society: Can assist with transportation, wigs, general needs, runs Look Good Feel Better.        7250358319 ? Cancer Care: Provides financial assistance, online support groups, medication/co-pay assistance.  1-800-813-HOPE 619-731-1996) ? [place] Assists [place] cancer patients and their families through emotional , educational and financial support.  323-445-1866 ? [place] DSS Where to apply for food stamps, Medicaid and utility assistance. (607)663-3589 ? RCATS: Transportation to medical appointments. 5392644185 ? Social Security Administration: May apply for disability if have a Stage IV cancer. [phone] ? [employer], Disability and Transit Services: Assists with nutrition, care and transit needs. [place] Support Programs: @10RELATIVEDAYS @ > Cancer Support Group  2nd Tuesday of the month 1pm-2pm, Journey Room  > Creative Journey  3rd Tuesday of the month 1130am-1pm, Journey Room  > Look Good Feel Better  1st Wednesday of the month 10am-12 noon, Journey Room (Call [place] to register [phone])

## 2017-12-07 NOTE — Telephone Encounter (Signed)
-----   Message from Creola Corn, MD sent at 12/07/2017  5:03 PM EST ----- Could you contact the patient and ask her to start potassium tonight, [facility] to [facility] 20 mEQ bid, potassium today is 3.1

## 2017-12-07 NOTE — Progress Notes (Addendum)
[facility] follow up note>  Chief complaint: Extensive stage small cell lung cancer,  returns today for cycle 4 therapy with ipilimumab and nivolumab for extensive stage small cell lung cancer. With brian metastasis s/p WBRT and also radiation to the right lung mass for recurrence  HPI: She returns today to review CT scans from 11/11/2017 She noticed increased pain in the rib cage, she has dry cough, no hemoptysis,  Denies worsening of SOB No diarrhea, rashes She denies any headache, no paresthesias, no gait imbalance, no visual problems. Appetite is fair. She is also seen at [facility] by Dr. Aniceto Boss recent visiit was 11/2017  Oncologic history:    Small cell lung cancer ([place])   07/12/2016 Imaging    CTA chest 3.9 x 3.5 cm right lower lobe mass with imaging features most compatible with a primary lung carcinoma.Marked metastatic right hilar and mediastinal adenopathy including a confluent mass of adenopathy in the right hilum, encasing and causing marked narrowing of the central pulmonary arteries on the right with a short segment of occlusion of the right lower lobe bronchus.Mild postobstructive changes in the inferior, medial right lower lobe.Mild changes of COPD       07/16/2016 Procedure    Endoscopic bronchoscopy with transbronchial biopsy of #7 nodes and biopsy of RLL lung mass      07/16/2016 Pathology Results    Lung, biopsy, Right Lower Lobe - SMALL CELL CARCINOMA.       07/21/2016 Imaging    MRI brain Negative MRI of the brain. No evidence for metastatic disease to the brain or meninges.      07/26/2016 PET scan    Markedly hypermetabolic right lower lobe lesion with hypermetabolic metastatic disease in the right hilum and mediastinum. 2. Several scattered hypermetabolic ground-glass opacities in the right upper lobe. FDG accumulation within these nodules is higher than typically seen for infectious/inflammatory etiology although this  remains within the differential. Atypical appearance of metastatic spread would also be a consideration. 3. Coronary artery atherosclerosis. 4. **An incidental finding of potential clinical significance has been found. 3.8 cm abdominal aortic aneurysm       07/27/2016 - 11/11/2016 Chemotherapy    Cisplatin/Etoposide x 6 cycles      11/09/2016 Treatment Plan Change    Patient declined/refused whole brain radiation.      11/23/2016 Imaging    CT chest with No new or progressive findings in the chest. 2. Further interval decrease in size of mediastinal lymphadenopathy. 3. Only trace scarring visible in the right lower lobe, at the location of the previous right lower lobe mass. 4. Coronary artery and thoracoabdominal aortic atherosclerosis      02/21/2017 Imaging    CT CAP- 1. Unfortunately the patient has had significant recurrence with marked mediastinal and right infrahilar adenopathy as well as suspected masses in the collapsed right lower lobe. The confluent adenopathy completely occlude the right lower lobe and right middle lobe bronchi, although there is some air trapping in the right middle lobe ; the right lower lobe is completely collapsed around several right lower lobe masses. 2. There is some speckled density in the left anterior sixth rib which is not entirely specific but which is concerning for early osseous metastatic disease given that it was not present on 11/23/2016. Consider a bone scan to further assess the skeleton for other occult bony spread. 3. Infrarenal abdominal aortic aneurysm 4.2 cm in diameter, increased from the prior measurement of 3.8 cm on 07/26/2016. Although  this is only minor increase, it has occurred in a 7 month period. The patient has a previous existing relationship of cardiothoracic surgery and surveillance of this aneurysm by surgery is appropriate. 4. Other imaging findings of potential clinical significance: Coronary, aortic arch, and  branch vessel atherosclerotic vascular disease. Mild lumbar spondylosis and degenerative disc disease.      02/22/2017 Relapse/Recurrence    Last Treatment Date: 11/11/2016 Recent Lab Values: Recent Lab Values: N/A      02/28/2017 -  Chemotherapy    Topotecan       03/08/2017 Imaging    MRI brain- 1. No evidence of metastatic disease. 2. Intermittently motion degraded.      03/08/2017 Imaging    Bone scan: IMPRESSION: No evidence skeletal metastasis by bone scintigraphy.      03/10/2017 Miscellaneous    Consult at [facility] with Dr. Aniceto Boss:  Following Topotecan therapy, she may be a candidate for immunotherapy at time of progression/relapse, with Nivolumab versus Nivolumab/Ipilumumab.  Response rates with single-agent immunotherapy is ~ 10% and with combo therapy 20-30%.  Responses are durable.  Rate of side effects from therapy are ~ 5% and 20-30%.  This therapy is not yet FDA approved and therefore if this therapy option is approved, prior approval from insurance company will be needed.  Dr. Aniceto Boss favors the combination therapy.  Additionally, she may be eligible for a clinical trial (ie Rova-T + nivolumab/ipilumumab).      04/07/2017 Imaging    CT CAP- 1. Persistent but improved bulky mediastinal and right hilar/infrahilar lymphadenopathy and significant decrease in size of the right lower lobe mass and obstructive pneumonitis. Residual branching endobronchial tumor in the right lower lobe. 2. No findings for pulmonary metastatic nodules or abdominal/pelvic lymphadenopathy. 3. Stable infrarenal abdominal aortic aneurysm.      04/12/2017 Treatment Plan Change    Tx deferred x 1 week due to hyponatremia requiring work-up.      08/19/2017 Imaging    MRI Brain w/ and w/o contrast: IMPRESSION: New 4 x 8 mm metastasis at the medial left posterior frontal lobe. Three other questionable small metastases, 1 at the posterolateral inferior left parietal lobe, 1 in the right  posterior parietal white matter, and 1 in the left frontal region. These cannot be confirmed on multiple sequences. Therefore, it may be worthwhile obtaining a 3 tesla examination, particularly if stereotactic radiotherapy is considered.      08/19/2017 Imaging    CT C/A/P: IMPRESSION: 1. Right lower lobe lung mass is increased since 08/04/2017 and significantly increased since 04/07/2017, with new occlusion of the right lower lobe bronchus and complete right lower lobe atelectasis. 2. Bulky ipsilateral hilar and right paratracheal and subcarinal mediastinal adenopathy, stable since 08/04/2017 and significantly increased since 04/07/2017. 3. New hypodense pancreatic tail mass, most compatible with pancreatic metastasis . 4. No additional sites of metastatic disease in the chest, abdomen or pelvis. 5. New trace dependent right pleural effusion. Increased interlobular septal thickening in the right middle lobe, favor localized pulmonary edema, cannot exclude lymphangitic tumor spread. 6. Small pericardial effusion, stable since 08/04/2017, new since 04/07/2017 . 7. Three-vessel coronary atherosclerosis. 8. Aortic Atherosclerosis (ICD10-I70.0). Mildly increased 4.4 cm Abdominal Aortic Aneurysm (ICD10-I71.9). Recommend follow-up aortic ultrasound in 1 year. This recommendation follows ACR consensus guidelines: White Paper of the ACR Incidental Findings Committee II on Vascular Findings. J Am Coll Radiol 2013; 10:789-794. 9. Mild colonic diverticulosis.       08/19/2017 Progression    New brain mets, enlarging right lower  lobe mass, bulky chest lymphadenopathy, new pancreatic tail lesion      08/29/2017 - 09/09/2017 Radiation Therapy    Under the care of Dr. Lisbeth Renshaw   Radiation treatment dates:   08/29/2017 - 09/09/2017  Site/dose:   The brain and right lung were each treated to 30 Gy in 10 fractions of 3 Gy.        11/11/2017 Imaging    CT C/A/P: Interval decrease in size  of mediastinal and right hilar lymphadenopathy down to 0.8 cm.  Decreased right middle lobe and right lower lobe mass lesions.  Unfortunately, multiple new skeletal lesions are noted including pathological fracture of the second rib on the right.  Mass in the tail of the pancreas is increasing in size now measuring 6.7 cm.  Additional new metastatic lesion in the perinephric fat measuring 1.9 cm.     PHYSICAL EXAMINATION There were no vitals filed for this visit.    ASSESSMENT:  Extensive stage small cell lung cancer with brian metastasis.  In reviewing her most recent CT scan she does show significant progression of her disease both in the lung as well as the abdomen particularly in the pancreatic tail she also has new rib metastasis which correlate with the pain that she is experiencing.  She had previously replapese within 6 months of completion of Platinum and Etoposide.  She had progressed through [place] in second line setting.  MR brian from 09/2017 shows resolution of previously treated brain mets.  Given that there are minimal options that are promising beyond immunotherapy, and assuming that the progression noted on the CT scan may represent a tumor flare we have opted to continue immunotherapy.  She does not seem to have any significant side effects from it at this time. Note from Dr. Durenda Hurt from 11/28/2016 at [place] has been reviewed and his recommendation appeared to be in alignment with mine.  Prognosis is poor, and patient is aware.  She understands that her life expectancy is less than 6 months and that if immunotherapy fails there are no promising future options for her management.  Patient states that she has a living will and that she does not wish any resuscitation in the event of respiratory or cardiac arrest.    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  Asymptomatic no intervention needed.  Hypokalemia- potassium 20 mg p.o. twice daily prescribed  recommended to start supplementing today.  During her infusion patient, was given a dose of IV morphine to help alleviate the right rib cage pain.  She also required a dose of Zofran for nausea.  Per her wishes, a DNR order has been placed in the ambulatory setting into the electronic medical record. She is unwilling to discontinue treatment at this time. For optimal pain management, I have given her a prescription for morphine sulfate IR 15-30 mg to be taken once every 4-6 hours as needed.  Senna has been prescribed for expected constipation related to opioids.  We will titrate the pain regimen per her response.   She knows to contact us for any new problems.    We will have her return next week to review her symptoms.    This note is electronically signed by: Creola Corn, MD 12/07/2017 6:27 AM

## 2017-12-07 NOTE — Progress Notes (Signed)
Tolerated infusion w/o adverse reaction.  Alert, in no distress.  VSS.  Discharged ambulatory.  

## 2017-12-07 NOTE — Telephone Encounter (Signed)
Patient contacted and instructed that her Potassium was low @ 3.1 and to start her Potassium prescription tonight and to take it twice a day. She said okay and verbalized understanding of the instructions.

## 2017-12-08 LAB — ACTH: C206 ACTH: 24.7 pg/mL (ref 7.2–63.3)

## 2017-12-08 LAB — CANCER ANTIGEN 19-9: CA 19-9: 19 U/mL (ref 0–35)

## 2017-12-12 ENCOUNTER — Encounter (HOSPITAL_COMMUNITY): Payer: Self-pay | Admitting: Adult Health

## 2017-12-12 NOTE — Progress Notes (Signed)
    December 12, 2017   To Whom It May Concern:    Please all this letter to serve as an appeal for recent denial of insurance coverage for denosumab Delton See) for Ms. Lydia Moore (DOB: 2060-12-10).  Patient has metastatic small cell lung cancer with bone metastases.  She has received Xgeva in the past for continued risk-reduction of skeletal related events; she has clinically experienced clinical benefit from [place]. Please re-consider denial for this medication, which is important to this patient's treatment plan and overall care. Feel free to contact our office with additional questions or concerns.     Hulda Marin, NP [place] [phone]

## 2017-12-13 ENCOUNTER — Other Ambulatory Visit (HOSPITAL_COMMUNITY): Admission: RE | Admit: 2017-12-13 | Payer: Managed Care, Other (non HMO) | Source: Ambulatory Visit

## 2017-12-13 ENCOUNTER — Inpatient Hospital Stay (HOSPITAL_COMMUNITY): Payer: Managed Care, Other (non HMO)

## 2017-12-13 DIAGNOSIS — Z5112 Encounter for antineoplastic immunotherapy: Secondary | ICD-10-CM | POA: Diagnosis not present

## 2017-12-13 DIAGNOSIS — C801 Malignant (primary) neoplasm, unspecified: Secondary | ICD-10-CM

## 2017-12-13 DIAGNOSIS — C3490 Malignant neoplasm of unspecified part of unspecified bronchus or lung: Secondary | ICD-10-CM

## 2017-12-13 LAB — CBC WITH DIFFERENTIAL/PLATELET
BASOS ABS: 0 10*3/uL (ref 0.0–0.1)
BASOS PCT: 0 %
Eosinophils Absolute: 0.1 10*3/uL (ref 0.0–0.7)
Eosinophils Relative: 1 %
HEMATOCRIT: 37.5 % (ref 36.0–46.0)
Hemoglobin: 11.7 g/dL — ABNORMAL LOW (ref 12.0–15.0)
Lymphocytes Relative: 9 %
Lymphs Abs: 0.6 10*3/uL — ABNORMAL LOW (ref 0.7–4.0)
MCH: 30.5 pg (ref 26.0–34.0)
MCHC: 31.2 g/dL (ref 30.0–36.0)
MCV: 97.7 fL (ref 78.0–100.0)
MONO ABS: 0 10*3/uL — AB (ref 0.1–1.0)
Monocytes Relative: 0 %
NEUTROS ABS: 6.4 10*3/uL (ref 1.7–7.7)
NEUTROS PCT: 90 %
Platelets: 150 10*3/uL (ref 150–400)
RBC: 3.84 MIL/uL — AB (ref 3.87–5.11)
RDW: 16.2 % — AB (ref 11.5–15.5)
WBC: 7.1 10*3/uL (ref 4.0–10.5)

## 2017-12-13 LAB — COMPREHENSIVE METABOLIC PANEL
ALBUMIN: 3.9 g/dL (ref 3.5–5.0)
ALT: 14 U/L (ref 14–54)
AST: 23 U/L (ref 15–41)
Alkaline Phosphatase: 56 U/L (ref 38–126)
Anion gap: 11 (ref 5–15)
BILIRUBIN TOTAL: 0.9 mg/dL (ref 0.3–1.2)
BUN: 36 mg/dL — AB (ref 6–20)
CHLORIDE: 98 mmol/L — AB (ref 101–111)
CO2: 26 mmol/L (ref 22–32)
Calcium: 8.3 mg/dL — ABNORMAL LOW (ref 8.9–10.3)
Creatinine, Ser: 1.48 mg/dL — ABNORMAL HIGH (ref 0.44–1.00)
GFR calc Af Amer: 45 mL/min — ABNORMAL LOW (ref >60)
GFR calc non Af Amer: 38 mL/min — ABNORMAL LOW (ref >60)
Glucose, Bld: 138 mg/dL — ABNORMAL HIGH (ref 65–99)
POTASSIUM: 3.9 mmol/L (ref 3.5–5.1)
Sodium: 135 mmol/L (ref 135–145)
TOTAL PROTEIN: 7.1 g/dL (ref 6.5–8.1)

## 2017-12-13 LAB — TSH: TSH: 0.376 u[IU]/mL (ref 0.350–4.500)

## 2017-12-14 ENCOUNTER — Inpatient Hospital Stay (HOSPITAL_BASED_OUTPATIENT_CLINIC_OR_DEPARTMENT_OTHER): Payer: Managed Care, Other (non HMO) | Admitting: Internal Medicine

## 2017-12-14 ENCOUNTER — Other Ambulatory Visit (HOSPITAL_COMMUNITY): Payer: Managed Care, Other (non HMO)

## 2017-12-14 ENCOUNTER — Encounter (HOSPITAL_COMMUNITY): Payer: Self-pay | Admitting: Internal Medicine

## 2017-12-14 ENCOUNTER — Other Ambulatory Visit (HOSPITAL_COMMUNITY): Payer: Self-pay | Admitting: Pharmacist

## 2017-12-14 ENCOUNTER — Other Ambulatory Visit: Payer: Self-pay

## 2017-12-14 VITALS — BP 115/84 | HR 91 | Temp 97.8°F | Resp 20 | Ht 65.0 in | Wt 126.0 lb

## 2017-12-14 DIAGNOSIS — C3431 Malignant neoplasm of lower lobe, right bronchus or lung: Secondary | ICD-10-CM | POA: Diagnosis not present

## 2017-12-14 DIAGNOSIS — M545 Low back pain: Secondary | ICD-10-CM

## 2017-12-14 DIAGNOSIS — C3490 Malignant neoplasm of unspecified part of unspecified bronchus or lung: Secondary | ICD-10-CM

## 2017-12-14 DIAGNOSIS — Z5112 Encounter for antineoplastic immunotherapy: Secondary | ICD-10-CM | POA: Diagnosis not present

## 2017-12-14 NOTE — Patient Instructions (Signed)
Lydia Moore at [facility] Discharge Instructions  RECOMMENDATIONS MADE BY THE CONSULTANT AND ANY TEST RESULTS WILL BE SENT TO YOUR REFERRING PHYSICIAN.  Today you saw Dr. Walden Field.   Return in 2 weeks for Keytruda/Opdivo, labs, and to see the physician.    XGEVA due January 04, 2018  If your pain or the area on your abdomen is getting larger - let us know so we can order a test to look @ the area.   Thank you for choosing [place] at [facility] to provide your oncology and hematology care.  To afford each patient quality time with our provider, please arrive at least 15 minutes before your scheduled appointment time.    If you have a lab appointment with the [facility] please come in thru the  Main Entrance and check in at the main information desk  You need to re-schedule your appointment should you arrive 10 or more minutes late.  We strive to give you quality time with our providers, and arriving late affects you and other patients whose appointments are after yours.  Also, if you no show three or more times for appointments you may be dismissed from the clinic at the providers discretion.     Again, thank you for choosing [facility].  Our hope is that these requests will decrease the amount of time that you wait before being seen by our physicians.       _____________________________________________________________  Should you have questions after your visit to [facility], please contact our office at [phone] between the hours of 8:30 a.m. and 4:30 p.m.  Voicemails left after 4:30 p.m. will not be returned until the following business day.  For prescription refill requests, have your pharmacy contact our office.       Resources For Cancer Patients and their Caregivers ? American Cancer Society: Can assist with transportation, wigs, general needs, runs Look Good Feel Better.         812-022-1558 ? Cancer Care: Provides financial assistance, online support groups, medication/co-pay assistance.  1-800-813-HOPE 859-476-2816) ? [place] Assists [place] cancer patients and their families through emotional , educational and financial support.  (778) 632-4401 ? [place] DSS Where to apply for food stamps, Medicaid and utility assistance. 828-279-6679 ? RCATS: Transportation to medical appointments. (918)168-4300 ? Social Security Administration: May apply for disability if have a Stage IV cancer. 7028224129 (971) 212-1654 ? [employer], Disability and Transit Services: Assists with nutrition, care and transit needs. [place] Support Programs: @10RELATIVEDAYS @ > Cancer Support Group  2nd Tuesday of the month 1pm-2pm, Journey Room  > Creative Journey  3rd Tuesday of the month 1130am-1pm, Journey Room  > Look Good Feel Better  1st Wednesday of the month 10am-12 noon, Journey Room (Call [place] to register [phone])

## 2017-12-16 NOTE — Progress Notes (Signed)
Diagnosis Malignant neoplasm of lung, unspecified laterality, unspecified part of lung ([place]) - Plan: Comprehensive metabolic panel, CBC with Differential  Staging Cancer Staging No matching staging information was found for the patient.  Assessment and Plan:  1.  Extensive stage small cell lung cancer, followed by Dr. Sherrine Maples and being treated with ipilimumab and nivolumab for extensive stage small cell lung cancer.  She will return to clinic as previously directed for the next cycle of treatment.  2.  Back pain.  She was seen a week ago by Dr. Sherrine Maples and was complaining of worsening back pain.  She was started on morphine and scheduled for follow-up in a week.  She is here today for evaluation and reports her back pain is significantly improved and she denies any other complaints today.  She should notify the office if symptoms change prior to her next follow-up appointment.   INTERVAL HISTORY: Extensive stage small cell lung cancer followed by Dr. Sherrine Maples being treated with Ipi and Nivo.  Current status: Patient is seen today for follow-up.  1 week ago she was seen by Dr. Sherrine Maples due to back pain.  She was started on morphine at that time and reports her pain is improved.  She denies any additional complaints today.     Small cell lung cancer ([place])   07/12/2016 Imaging    CTA chest 3.9 x 3.5 cm right lower lobe mass with imaging features most compatible with a primary lung carcinoma.Marked metastatic right hilar and mediastinal adenopathy including a confluent mass of adenopathy in the right hilum, encasing and causing marked narrowing of the central pulmonary arteries on the right with a short segment of occlusion of the right lower lobe bronchus.Mild postobstructive changes in the inferior, medial right lower lobe.Mild changes of COPD       07/16/2016 Procedure    Endoscopic bronchoscopy with transbronchial biopsy of #7 nodes and biopsy of RLL lung mass      07/16/2016  Pathology Results    Lung, biopsy, Right Lower Lobe - SMALL CELL CARCINOMA.       07/21/2016 Imaging    MRI brain Negative MRI of the brain. No evidence for metastatic disease to the brain or meninges.      07/26/2016 PET scan    Markedly hypermetabolic right lower lobe lesion with hypermetabolic metastatic disease in the right hilum and mediastinum. 2. Several scattered hypermetabolic ground-glass opacities in the right upper lobe. FDG accumulation within these nodules is higher than typically seen for infectious/inflammatory etiology although this remains within the differential. Atypical appearance of metastatic spread would also be a consideration. 3. Coronary artery atherosclerosis. 4. **An incidental finding of potential clinical significance has been found. 3.8 cm abdominal aortic aneurysm       07/27/2016 - 11/11/2016 Chemotherapy    Cisplatin/Etoposide x 6 cycles      11/09/2016 Treatment Plan Change    Patient declined/refused whole brain radiation.      11/23/2016 Imaging    CT chest with No new or progressive findings in the chest. 2. Further interval decrease in size of mediastinal lymphadenopathy. 3. Only trace scarring visible in the right lower lobe, at the location of the previous right lower lobe mass. 4. Coronary artery and thoracoabdominal aortic atherosclerosis      02/21/2017 Imaging    CT CAP- 1. Unfortunately the patient has had significant recurrence with marked mediastinal and right infrahilar adenopathy as well as suspected masses in the collapsed right lower lobe. The confluent adenopathy completely occlude  the right lower lobe and right middle lobe bronchi, although there is some air trapping in the right middle lobe ; the right lower lobe is completely collapsed around several right lower lobe masses. 2. There is some speckled density in the left anterior sixth rib which is not entirely specific but which is concerning for early osseous  metastatic disease given that it was not present on 11/23/2016. Consider a bone scan to further assess the skeleton for other occult bony spread. 3. Infrarenal abdominal aortic aneurysm 4.2 cm in diameter, increased from the prior measurement of 3.8 cm on 07/26/2016. Although this is only minor increase, it has occurred in a 7 month period. The patient has a previous existing relationship of cardiothoracic surgery and surveillance of this aneurysm by surgery is appropriate. 4. Other imaging findings of potential clinical significance: Coronary, aortic arch, and branch vessel atherosclerotic vascular disease. Mild lumbar spondylosis and degenerative disc disease.      02/22/2017 Relapse/Recurrence    Last Treatment Date: 11/11/2016 Recent Lab Values: Recent Lab Values: N/A      02/28/2017 -  Chemotherapy    Topotecan       03/08/2017 Imaging    MRI brain- 1. No evidence of metastatic disease. 2. Intermittently motion degraded.      03/08/2017 Imaging    Bone scan: IMPRESSION: No evidence skeletal metastasis by bone scintigraphy.      03/10/2017 Miscellaneous    Consult at [place] with Dr. Aniceto Boss:  Following Topotecan therapy, she may be a candidate for immunotherapy at time of progression/relapse, with Nivolumab versus Nivolumab/Ipilumumab.  Response rates with single-agent immunotherapy is ~ 10% and with combo therapy 20-30%.  Responses are durable.  Rate of side effects from therapy are ~ 5% and 20-30%.  This therapy is not yet FDA approved and therefore if this therapy option is approved, prior approval from insurance company will be needed.  Dr. Aniceto Boss favors the combination therapy.  Additionally, she may be eligible for a clinical trial (ie Rova-T + nivolumab/ipilumumab).      04/07/2017 Imaging    CT CAP- 1. Persistent but improved bulky mediastinal and right hilar/infrahilar lymphadenopathy and significant decrease in size of the right lower lobe mass and obstructive  pneumonitis. Residual branching endobronchial tumor in the right lower lobe. 2. No findings for pulmonary metastatic nodules or abdominal/pelvic lymphadenopathy. 3. Stable infrarenal abdominal aortic aneurysm.      04/12/2017 Treatment Plan Change    Tx deferred x 1 week due to hyponatremia requiring work-up.      08/19/2017 Imaging    MRI Brain w/ and w/o contrast: IMPRESSION: New 4 x 8 mm metastasis at the medial left posterior frontal lobe. Three other questionable small metastases, 1 at the posterolateral inferior left parietal lobe, 1 in the right posterior parietal white matter, and 1 in the left frontal region. These cannot be confirmed on multiple sequences. Therefore, it may be worthwhile obtaining a 3 tesla examination, particularly if stereotactic radiotherapy is considered.      08/19/2017 Imaging    CT C/A/P: IMPRESSION: 1. Right lower lobe lung mass is increased since 08/04/2017 and significantly increased since 04/07/2017, with new occlusion of the right lower lobe bronchus and complete right lower lobe atelectasis. 2. Bulky ipsilateral hilar and right paratracheal and subcarinal mediastinal adenopathy, stable since 08/04/2017 and significantly increased since 04/07/2017. 3. New hypodense pancreatic tail mass, most compatible with pancreatic metastasis . 4. No additional sites of metastatic disease in the chest, abdomen or pelvis. 5. New  trace dependent right pleural effusion. Increased interlobular septal thickening in the right middle lobe, favor localized pulmonary edema, cannot exclude lymphangitic tumor spread. 6. Small pericardial effusion, stable since 08/04/2017, new since 04/07/2017 . 7. Three-vessel coronary atherosclerosis. 8. Aortic Atherosclerosis (ICD10-I70.0). Mildly increased 4.4 cm Abdominal Aortic Aneurysm (ICD10-I71.9). Recommend follow-up aortic ultrasound in 1 year. This recommendation follows ACR consensus guidelines: White Paper of the  ACR Incidental Findings Committee II on Vascular Findings. J Am Coll Radiol 2013; 10:789-794. 9. Mild colonic diverticulosis.       08/19/2017 Progression    New brain mets, enlarging right lower lobe mass, bulky chest lymphadenopathy, new pancreatic tail lesion      08/29/2017 - 09/09/2017 Radiation Therapy    Under the care of Dr. Lisbeth Renshaw   Radiation treatment dates:   08/29/2017 - 09/09/2017  Site/dose:   The brain and right lung were each treated to 30 Gy in 10 fractions of 3 Gy.        11/11/2017 Imaging    CT C/A/P: Interval decrease in size of mediastinal and right hilar lymphadenopathy down to 0.8 cm.  Decreased right middle lobe and right lower lobe mass lesions.  Unfortunately, multiple new skeletal lesions are noted including pathological fracture of the second rib on the right.  Mass in the tail of the pancreas is increasing in size now measuring 6.7 cm.  Additional new metastatic lesion in the perinephric fat measuring 1.9 cm.       Problem List Patient Active Problem List   Diagnosis Date Noted  . Pancreatic mass [K86.9]   . Bone metastasis ([place]) [C79.51] 11/16/2017  . Intractable vomiting with nausea [R11.2] 09/13/2017  . Intractable vomiting [R11.10] 09/13/2017  . 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 Goals of care, counseling/discussion [Z71.89] 03/21/2017  . 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 Tobacco abuse [Z72.0] 08/17/2016  . Small cell lung cancer ([place]) [C34.90] 07/21/2016  . Melena [K92.1] 04/17/2012    Past Medical History Past Medical History:  Diagnosis Date  . Elevated cholesterol   . GERD (gastroesophageal reflux disease)   . GI bleed   . Goals of care, counseling/discussion 03/21/2017  . Hypertension   . Peripheral neuropathy due to chemotherapy ([place]) 02/22/2017  . Small  cell carcinoma ([place]) 07/21/2016    Past Surgical History Past Surgical History:  Procedure Laterality Date  . ABDOMINAL HYSTERECTOMY     partial  . CESAREAN SECTION    . COLONOSCOPY    . EUS N/A 12/01/2017   Procedure: UPPER ENDOSCOPIC ULTRASOUND (EUS) RADIAL;  Surgeon: Milus Banister, MD;  Location: [facility];  Service: Endoscopy;  Laterality: N/A;  . PORTACATH PLACEMENT Left 08/04/2016   Procedure: INSERTION PORT-A-CATH LEFT SUBCLAVIAN;  Surgeon: Aviva Signs, MD;  Location: [facility] ORS;  Service: General;  Laterality: Left;  . TONSILLECTOMY    . VIDEO BRONCHOSCOPY WITH ENDOBRONCHIAL ULTRASOUND N/A 07/16/2016   Procedure: VIDEO BRONCHOSCOPY WITH ENDOBRONCHIAL ULTRASOUND with ultrasound transbronchial biopsy of node #7 and right lower lobe lesion;  Surgeon: Grace Isaac, MD;  [facility]: [facility];  Service: Thoracic;  Laterality: N/A;    Family History Family History  Problem Relation Age of Onset  . Heart disease Mother   . Heart disease Father   . COPD Sister   . HIV Brother      Social History  reports that she has been smoking  cigarettes.  She has a 17.50 pack-year smoking history. she has never used smokeless tobacco. She reports that she drinks alcohol. She reports that she uses drugs. Drug: Marijuana. Frequency: 1.00 time per week.  Medications  Current Outpatient Medications:  .  amLODipine (NORVASC) 5 MG tablet, Take 5 mg by mouth daily., Disp: , Rfl: 3 .  dexamethasone (DECADRON) 2 MG tablet, Take 1 tablet (2 mg total) by mouth 2 (two) times daily with a meal., Disp: 60 tablet, Rfl: 1 .  DULoxetine (CYMBALTA) 60 MG capsule, Take 1 capsule (60 mg total) by mouth daily., Disp: 30 capsule, Rfl: 3 .  IPILIMUMAB IV, Inject every 21 ( twenty-one) days into the vein., Disp: , Rfl:  .  loratadine (CLARITIN) 10 MG tablet, Take 1 tablet (10 mg total) by mouth daily as needed. (Patient taking differently: Take 10 mg by mouth daily. ), Disp: 30 tablet, Rfl: 3 .  morphine (MSIR) 15 MG  tablet, Take 1 tablet (15 mg total) by mouth every 4 (four) hours as needed for severe pain., Disp: 30 tablet, Rfl: 0 .  NIVOLUMAB IV, Inject every 21 ( twenty-one) days into the vein., Disp: , Rfl:  .  potassium chloride SA (K-DUR,KLOR-CON) 20 MEQ tablet, Take 1 tablet (20 mEq total) by mouth 2 (two) times daily., Disp: 60 tablet, Rfl: 1 .  senna (SENOKOT) 8.6 MG TABS tablet, Take 1 tablet (8.6 mg total) by mouth at bedtime., Disp: 120 each, Rfl: 0 .  zolpidem (AMBIEN) 10 MG tablet, Take 10 mg by mouth at bedtime as needed. for sleep, Disp: , Rfl: 3  Allergies Patient has no known allergies.  Review of Systems Review of Systems - Oncology ROS as per HPI otherwise 12 point ROS is negative.   Physical Exam  Vitals Wt Readings from Last 3 Encounters:  12/14/17 126 lb (57.2 kg)  12/07/17 130 lb 12.8 oz (59.3 kg)  12/01/17 131 lb (59.4 kg)   Temp Readings from Last 3 Encounters:  12/14/17 97.8 F (36.6 C) (Oral)  12/07/17 98.4 F (36.9 C) (Oral)  12/01/17 99.6 F (37.6 C) (Oral)   BP Readings from Last 3 Encounters:  12/14/17 115/84  12/07/17 126/90  12/01/17 133/85   Pulse Readings from Last 3 Encounters:  12/14/17 91  12/07/17 95  12/01/17 91   Constitutional: Well-developed, well-nourished, and in no distress.   HENT:  Head: Normocephalic and atraumatic.  Mouth/Throat: No oropharyngeal exudate. Mucosa moist. Eyes: Pupils are equal, round, and reactive to light. Conjunctivae are normal. No scleral icterus.  Neck: Normal range of motion. Neck supple. No JVD present.  Cardiovascular: Normal rate, regular rhythm and normal heart sounds.  Exam reveals no gallop and no friction rub.   No murmur heard. Pulmonary/Chest: Coarse breath sounds. Abdominal: Soft. Bowel sounds are normal. No distension. There is no tenderness. There is no guarding.  Musculoskeletal: No edema or tenderness.  Lymphadenopathy: No cervical or supraclavicular adenopathy.  Neurological: Alert and  oriented to person, place, and time. No cranial nerve deficit.  Skin: Skin is warm and dry. No rash noted. No erythema. No pallor.  Psychiatric: Affect and judgment normal.   Labs Appointment on 12/13/2017  Component Date Value Ref Range Status  . WBC 12/13/2017 7.1  4.0 - 10.5 K/uL Final  . RBC 12/13/2017 3.84* 3.87 - 5.11 MIL/uL Final  . Hemoglobin 12/13/2017 11.7* 12.0 - 15.0 g/dL Final  . HCT 12/13/2017 37.5  36.0 - 46.0 % Final  . MCV 12/13/2017 97.7  78.0 -  100.0 fL Final  . MCH 12/13/2017 30.5  26.0 - 34.0 pg Final  . MCHC 12/13/2017 31.2  30.0 - 36.0 g/dL Final  . RDW 12/13/2017 16.2* 11.5 - 15.5 % Final  . Platelets 12/13/2017 150  150 - 400 K/uL Final  . Neutrophils Relative % 12/13/2017 90  % Final  . Neutro Abs 12/13/2017 6.4  1.7 - 7.7 K/uL Final  . Lymphocytes Relative 12/13/2017 9  % Final  . Lymphs Abs 12/13/2017 0.6* 0.7 - 4.0 K/uL Final  . Monocytes Relative 12/13/2017 0  % Final  . Monocytes Absolute 12/13/2017 0.0* 0.1 - 1.0 K/uL Final  . Eosinophils Relative 12/13/2017 1  % Final  . Eosinophils Absolute 12/13/2017 0.1  0.0 - 0.7 K/uL Final  . Basophils Relative 12/13/2017 0  % Final  . Basophils Absolute 12/13/2017 0.0  0.0 - 0.1 K/uL Final  . Sodium 12/13/2017 135  135 - 145 mmol/L Final  . Potassium 12/13/2017 3.9  3.5 - 5.1 mmol/L Final  . Chloride 12/13/2017 98* 101 - 111 mmol/L Final  . CO2 12/13/2017 26  22 - 32 mmol/L Final  . Glucose, Bld 12/13/2017 138* 65 - 99 mg/dL Final  . BUN 12/13/2017 36* 6 - 20 mg/dL Final  . Creatinine, Ser 12/13/2017 1.48* 0.44 - 1.00 mg/dL Final  . Calcium 12/13/2017 8.3* 8.9 - 10.3 mg/dL Final  . Total Protein 12/13/2017 7.1  6.5 - 8.1 g/dL Final  . Albumin 12/13/2017 3.9  3.5 - 5.0 g/dL Final  . AST 12/13/2017 23  15 - 41 U/L Final  . ALT 12/13/2017 14  14 - 54 U/L Final  . Alkaline Phosphatase 12/13/2017 56  38 - 126 U/L Final  . Total Bilirubin 12/13/2017 0.9  0.3 - 1.2 mg/dL Final  . GFR calc non Af Amer 12/13/2017  38* >60 mL/min Final  . GFR calc Af Amer 12/13/2017 45* >60 mL/min Final   Comment: (NOTE) The eGFR has been calculated using the CKD EPI equation. This calculation has not been validated in all clinical situations. eGFR's persistently <60 mL/min signify possible Chronic Kidney Disease.   . Anion gap 12/13/2017 11  5 - 15 Final  . TSH 12/13/2017 0.376  0.350 - 4.500 uIU/mL Final   Performed by a 3rd Generation assay with a functional sensitivity of <=0.01 uIU/mL.     Pathology Orders Placed This Encounter  Procedures  . Comprehensive metabolic panel    Standing Status:   Future    Standing Expiration Date:   12/14/2018  . CBC with Differential    Standing Status:   Future    Standing Expiration Date:   12/14/2018       Zoila Shutter MD

## 2017-12-28 ENCOUNTER — Inpatient Hospital Stay (HOSPITAL_BASED_OUTPATIENT_CLINIC_OR_DEPARTMENT_OTHER): Payer: Managed Care, Other (non HMO) | Admitting: Internal Medicine

## 2017-12-28 ENCOUNTER — Encounter (HOSPITAL_COMMUNITY): Payer: Self-pay

## 2017-12-28 ENCOUNTER — Encounter (HOSPITAL_COMMUNITY): Payer: Self-pay | Admitting: Internal Medicine

## 2017-12-28 ENCOUNTER — Other Ambulatory Visit: Payer: Self-pay

## 2017-12-28 ENCOUNTER — Inpatient Hospital Stay (HOSPITAL_COMMUNITY): Payer: Managed Care, Other (non HMO)

## 2017-12-28 ENCOUNTER — Inpatient Hospital Stay (HOSPITAL_COMMUNITY): Payer: Managed Care, Other (non HMO) | Attending: Internal Medicine

## 2017-12-28 VITALS — BP 153/98 | HR 74 | Temp 97.6°F | Resp 18 | Wt 120.6 lb

## 2017-12-28 DIAGNOSIS — C3431 Malignant neoplasm of lower lobe, right bronchus or lung: Secondary | ICD-10-CM | POA: Insufficient documentation

## 2017-12-28 DIAGNOSIS — Z5112 Encounter for antineoplastic immunotherapy: Secondary | ICD-10-CM | POA: Diagnosis not present

## 2017-12-28 DIAGNOSIS — M899 Disorder of bone, unspecified: Secondary | ICD-10-CM | POA: Diagnosis not present

## 2017-12-28 DIAGNOSIS — C3490 Malignant neoplasm of unspecified part of unspecified bronchus or lung: Secondary | ICD-10-CM

## 2017-12-28 DIAGNOSIS — Z79899 Other long term (current) drug therapy: Secondary | ICD-10-CM | POA: Diagnosis not present

## 2017-12-28 DIAGNOSIS — M545 Low back pain: Secondary | ICD-10-CM | POA: Insufficient documentation

## 2017-12-28 LAB — CBC WITH DIFFERENTIAL/PLATELET
BASOS PCT: 0 %
Basophils Absolute: 0 10*3/uL (ref 0.0–0.1)
Eosinophils Absolute: 0 10*3/uL (ref 0.0–0.7)
Eosinophils Relative: 0 %
HEMATOCRIT: 38.4 % (ref 36.0–46.0)
HEMOGLOBIN: 11.9 g/dL — AB (ref 12.0–15.0)
LYMPHS PCT: 6 %
Lymphs Abs: 0.5 10*3/uL — ABNORMAL LOW (ref 0.7–4.0)
MCH: 29.4 pg (ref 26.0–34.0)
MCHC: 31 g/dL (ref 30.0–36.0)
MCV: 94.8 fL (ref 78.0–100.0)
MONO ABS: 0.4 10*3/uL (ref 0.1–1.0)
MONOS PCT: 6 %
NEUTROS ABS: 6.3 10*3/uL (ref 1.7–7.7)
Neutrophils Relative %: 88 %
Platelets: 172 10*3/uL (ref 150–400)
RBC: 4.05 MIL/uL (ref 3.87–5.11)
RDW: 15.8 % — AB (ref 11.5–15.5)
WBC: 7.2 10*3/uL (ref 4.0–10.5)

## 2017-12-28 LAB — COMPREHENSIVE METABOLIC PANEL
ALBUMIN: 3.5 g/dL (ref 3.5–5.0)
ALT: 19 U/L (ref 14–54)
ANION GAP: 14 (ref 5–15)
AST: 31 U/L (ref 15–41)
Alkaline Phosphatase: 58 U/L (ref 38–126)
BUN: 28 mg/dL — ABNORMAL HIGH (ref 6–20)
CHLORIDE: 99 mmol/L — AB (ref 101–111)
CO2: 23 mmol/L (ref 22–32)
Calcium: 8.6 mg/dL — ABNORMAL LOW (ref 8.9–10.3)
Creatinine, Ser: 1.02 mg/dL — ABNORMAL HIGH (ref 0.44–1.00)
GFR calc non Af Amer: >60 mL/min (ref >60)
GLUCOSE: 161 mg/dL — AB (ref 65–99)
POTASSIUM: 3.8 mmol/L (ref 3.5–5.1)
SODIUM: 136 mmol/L (ref 135–145)
Total Bilirubin: 0.7 mg/dL (ref 0.3–1.2)
Total Protein: 7.5 g/dL (ref 6.5–8.1)

## 2017-12-28 LAB — TSH: TSH: 1.509 u[IU]/mL (ref 0.350–4.500)

## 2017-12-28 LAB — LACTATE DEHYDROGENASE: LDH: 194 U/L — ABNORMAL HIGH (ref 98–192)

## 2017-12-28 MED ORDER — HEPARIN SOD (PORK) LOCK FLUSH 100 UNIT/ML IV SOLN
500.0000 [IU] | Freq: Once | INTRAVENOUS | Status: AC | PRN
  Administered 2017-12-28: 500 [IU]

## 2017-12-28 MED ORDER — SODIUM CHLORIDE 0.9 % IV SOLN
Freq: Once | INTRAVENOUS | Status: AC
  Administered 2017-12-28: 13:00:00 via INTRAVENOUS

## 2017-12-28 MED ORDER — MORPHINE SULFATE 15 MG PO TABS: 15.0000 mg | ORAL_TABLET | ORAL | 0 refills | Status: AC | PRN

## 2017-12-28 MED ORDER — SODIUM CHLORIDE 0.9 % IV SOLN
1.0000 mg/kg | Freq: Once | INTRAVENOUS | Status: AC
  Administered 2017-12-28: 50 mg via INTRAVENOUS
  Filled 2017-12-28: qty 10

## 2017-12-28 MED ORDER — SODIUM CHLORIDE 0.9% FLUSH
10.0000 mL | INTRAVENOUS | Status: DC | PRN
  Administered 2017-12-28: 10 mL
  Filled 2017-12-28: qty 10

## 2017-12-28 MED ORDER — SODIUM CHLORIDE 0.9 % IV SOLN
140.0000 mg | Freq: Once | INTRAVENOUS | Status: AC
  Administered 2017-12-28: 140 mg via INTRAVENOUS
  Filled 2017-12-28: qty 4

## 2017-12-28 NOTE — Patient Instructions (Signed)
[facility] Discharge Instructions for Patients Receiving Chemotherapy   Beginning January 23rd 2017 lab work for the Cancer Center will be done in the  Main lab at [facility] on 1st floor. If you have a lab appointment with the Cancer Center please come in thru the  Main Entrance and check in at the main information desk   Today you received the following chemotherapy agents   To help prevent nausea and vomiting after your treatment, we encourage you to take your nausea medication     If you develop nausea and vomiting, or diarrhea that is not controlled by your medication, call the clinic.  The clinic phone number is [phone]. Office hours are Monday-Friday 8:30am-5:00pm.  BELOW ARE SYMPTOMS THAT SHOULD BE REPORTED IMMEDIATELY:  *FEVER GREATER THAN 101.0 F  *CHILLS WITH OR WITHOUT FEVER  NAUSEA AND VOMITING THAT IS NOT CONTROLLED WITH YOUR NAUSEA MEDICATION  *UNUSUAL SHORTNESS OF BREATH  *UNUSUAL BRUISING OR BLEEDING  TENDERNESS IN MOUTH AND THROAT WITH OR WITHOUT PRESENCE OF ULCERS  *URINARY PROBLEMS  *BOWEL PROBLEMS  UNUSUAL RASH Items with * indicate a potential emergency and should be followed up as soon as possible. If you have an emergency after office hours please contact your primary care physician or go to the nearest emergency department.  Please call the clinic during office hours if you have any questions or concerns.   You may also contact the Patient Navigator at [phone] should you have any questions or need assistance in obtaining follow up care.      Resources For Cancer Patients and their Caregivers ? American Cancer Society: Can assist with transportation, wigs, general needs, runs Look Good Feel Better.        1-888-227-6333 ? Cancer Care: Provides financial assistance, online support groups, medication/co-pay assistance.  1-800-813-HOPE (4673) ? [facility] Assists [place] cancer  patients and their families through emotional , educational and financial support.  336-427-4357 ? [place] DSS Where to apply for food stamps, Medicaid and utility assistance. 336-342-1394 ? RCATS: Transportation to medical appointments. 336-347-2287 ? Social Security Administration: May apply for disability if have a Stage IV cancer. 336-342-7796 1-800-772-1213 ? [place] Aging, Disability and Transit Services: Assists with nutrition, care and transit needs. 336-349-2343         

## 2017-12-28 NOTE — Progress Notes (Signed)
Tolerated infusion w/o adverse reaction.  Alert, in no distress.  VSS.  Discharged ambulatory in c/o family.

## 2017-12-28 NOTE — Progress Notes (Signed)
Labs reviewed with patient today, seen by MD today. Proceed with treatment per MD.   Treatment given per orders. Patient tolerated it well without problems. Vitals stable and discharged home from clinic ambulatory. Follow up as scheduled.

## 2017-12-28 NOTE — Patient Instructions (Addendum)
Lydia Moore at [facility] Discharge Instructions  RECOMMENDATIONS MADE BY THE CONSULTANT AND ANY TEST RESULTS WILL BE SENT TO YOUR REFERRING PHYSICIAN.  You were seen today by Dr. Mathis Dad Higgs  Restart Boost to see if that increases weight gain  We will treat today.  CT Friday @ [facility]  Return to see MD in 3 weeks with next treatment  Continue XGEVA monthly or you may move it out to every 6 weeks if it is difficult to get back and forth to the CC.     Thank you for choosing [facility] at [facility] to provide your oncology and hematology care.  To afford each patient quality time with our provider, please arrive at least 15 minutes before your scheduled appointment time.    If you have a lab appointment with the [facility] please come in thru the  Main Entrance and check in at the main information desk  You need to re-schedule your appointment should you arrive 10 or more minutes late.  We strive to give you quality time with our providers, and arriving late affects you and other patients whose appointments are after yours.  Also, if you no show three or more times for appointments you may be dismissed from the clinic at the providers discretion.     Again, thank you for choosing [facility].  Our hope is that these requests will decrease the amount of time that you wait before being seen by our physicians.       _____________________________________________________________  Should you have questions after your visit to [facility], please contact our office at [phone] between the hours of 8:30 a.m. and 4:30 p.m.  Voicemails left after 4:30 p.m. will not be returned until the following business day.  For prescription refill requests, have your pharmacy contact our office.       Resources For Cancer Patients and their Caregivers ? American Cancer Society: Can assist with transportation, wigs, general  needs, runs Look Good Feel Better.        620-444-9790 ? Cancer Care: Provides financial assistance, online support groups, medication/co-pay assistance.  1-800-813-HOPE 772-855-8079) ? [facility] Assists [place] cancer patients and their families through emotional , educational and financial support.  586-848-8993 ? [place] DSS Where to apply for food stamps, Medicaid and utility assistance. (334)289-7510 ? RCATS: Transportation to medical appointments. 6054950553 ? Social Security Administration: May apply for disability if have a Stage IV cancer. (260)428-2837 585-095-5368 ? [employer], Disability and Transit Services: Assists with nutrition, care and transit needs. [place] Support Programs: @10RELATIVEDAYS @ > Cancer Support Group  2nd Tuesday of the month 1pm-2pm, Journey Room  > Creative Journey  3rd Tuesday of the month 1130am-1pm, Journey Room  > Look Good Feel Better  1st Wednesday of the month 10am-12 noon, Journey Room (Call [facility] to register [phone])

## 2018-01-03 ENCOUNTER — Encounter (HOSPITAL_COMMUNITY): Payer: Self-pay | Admitting: Adult Health

## 2018-01-03 NOTE — Progress Notes (Signed)
    January 03, 2018   To Whom It May Concern:   Please allow this documentation to serve as letter of medical necessity for Ms. Lydia Moore (DOB: [birth year]) for denosumab Delton See) therapy. Patient has extensive stage small cell lung cancer.   In 11/11/17, her CT showed new right-sided rib lesions with pathologic fractures. Delton See is being given to help prevent further skeletal related events for [name] given bony metastatic disease.  Bisphosphonate therapy with medications, like Xgeva, is evidence-based and should be approved for this patient population.  Please approve the use of this medication for this patient and feel free to contact us with any questions/concerns going forward.    Best,     Mike Craze, NP [place] [phone]

## 2018-01-04 ENCOUNTER — Ambulatory Visit (HOSPITAL_COMMUNITY): Payer: Managed Care, Other (non HMO)

## 2018-01-04 NOTE — Progress Notes (Signed)
Diagnosis No diagnosis found.  Staging Cancer Staging No matching staging information was found for the patient.  Assessment and Plan:  1.  Extensive stage small cell lung cancer, followed by Dr. Sherrine Maples and being treated with ipilimumab and nivolumab for extensive stage small cell lung cancer.  Lydia Moore reports Lydia Moore is scheduled to be seen at [facility] on Friday for scans.  Will await results of the imaging and their recommendations.  Lydia Moore will tentatively be scheduled for follow-up in 3 weeks.  Labs adequate for therapy and Lydia Moore will proceed with therapy.    2.  Bone lesions.  This was noted on recent imaging.  Lydia Moore is on Xgeva monthly.  3.  Anorexia.  Lydia Moore is encouraged to use nutritional supplements such as Boost.  4.  Back pain.  Lydia Moore was seen by Dr. Sherrine Maples and was complaining of worsening back pain.  Lydia Moore was started on morphine and  reports her back pain is significantly improved.  Lydia Moore requests  RX for MSIR 15 mg q 4 hrs prn and is given today.    INTERVAL HISTORY: Extensive stage small cell lung cancer followed by Dr. Sherrine Maples being treated with Ipi and Nivo.  Current status: Patient is seen today for follow-up.  Lydia Moore reports Lydia Moore is scheduled to be seen in [place] and will undergo imaging on Friday.  Lydia Moore is here today for cycle 5 of therapy.      Small cell lung cancer ([place])   07/12/2016 Imaging    CTA chest 3.9 x 3.5 cm right lower lobe mass with imaging features most compatible with a primary lung carcinoma.Marked metastatic right hilar and mediastinal adenopathy including a confluent mass of adenopathy in the right hilum, encasing and causing marked narrowing of the central pulmonary arteries on the right with a short segment of occlusion of the right lower lobe bronchus.Mild postobstructive changes in the inferior, medial right lower lobe.Mild changes of COPD       07/16/2016 Procedure    Endoscopic bronchoscopy with transbronchial biopsy of #7 nodes and biopsy of RLL lung mass       07/16/2016 Pathology Results    Lung, biopsy, Right Lower Lobe - SMALL CELL CARCINOMA.       07/21/2016 Imaging    MRI brain Negative MRI of the brain. No evidence for metastatic disease to the brain or meninges.      07/26/2016 PET scan    Markedly hypermetabolic right lower lobe lesion with hypermetabolic metastatic disease in the right hilum and mediastinum. 2. Several scattered hypermetabolic ground-glass opacities in the right upper lobe. FDG accumulation within these nodules is higher than typically seen for infectious/inflammatory etiology although this remains within the differential. Atypical appearance of metastatic spread would also be a consideration. 3. Coronary artery atherosclerosis. 4. **An incidental finding of potential clinical significance has been found. 3.8 cm abdominal aortic aneurysm       07/27/2016 - 11/11/2016 Chemotherapy    Cisplatin/Etoposide x 6 cycles      11/09/2016 Treatment Plan Change    Patient declined/refused whole brain radiation.      11/23/2016 Imaging    CT chest with No new or progressive findings in the chest. 2. Further interval decrease in size of mediastinal lymphadenopathy. 3. Only trace scarring visible in the right lower lobe, at the location of the previous right lower lobe mass. 4. Coronary artery and thoracoabdominal aortic atherosclerosis      02/21/2017 Imaging    CT CAP- 1. Unfortunately the patient has had significant recurrence  with marked mediastinal and right infrahilar adenopathy as well as suspected masses in the collapsed right lower lobe. The confluent adenopathy completely occlude the right lower lobe and right middle lobe bronchi, although there is some air trapping in the right middle lobe ; the right lower lobe is completely collapsed around several right lower lobe masses. 2. There is some speckled density in the left anterior sixth rib which is not entirely specific but which is concerning for  early osseous metastatic disease given that it was not present on 11/23/2016. Consider a bone scan to further assess the skeleton for other occult bony spread. 3. Infrarenal abdominal aortic aneurysm 4.2 cm in diameter, increased from the prior measurement of 3.8 cm on 07/26/2016. Although this is only minor increase, it has occurred in a 7 month period. The patient has a previous existing relationship of cardiothoracic surgery and surveillance of this aneurysm by surgery is appropriate. 4. Other imaging findings of potential clinical significance: Coronary, aortic arch, and branch vessel atherosclerotic vascular disease. Mild lumbar spondylosis and degenerative disc disease.      02/22/2017 Relapse/Recurrence    Last Treatment Date: 11/11/2016 Recent Lab Values: Recent Lab Values: N/A      02/28/2017 -  Chemotherapy    Topotecan       03/08/2017 Imaging    MRI brain- 1. No evidence of metastatic disease. 2. Intermittently motion degraded.      03/08/2017 Imaging    Bone scan: IMPRESSION: No evidence skeletal metastasis by bone scintigraphy.      03/10/2017 Miscellaneous    Consult at [place] with Dr. Aniceto Boss:  Following Topotecan therapy, Lydia Moore may be a candidate for immunotherapy at time of progression/relapse, with Nivolumab versus Nivolumab/Ipilumumab.  Response rates with single-agent immunotherapy is ~ 10% and with combo therapy 20-30%.  Responses are durable.  Rate of side effects from therapy are ~ 5% and 20-30%.  This therapy is not yet FDA approved and therefore if this therapy option is approved, prior approval from insurance company will be needed.  Dr. Aniceto Boss favors the combination therapy.  Additionally, Lydia Moore may be eligible for a clinical trial (ie Rova-T + nivolumab/ipilumumab).      04/07/2017 Imaging    CT CAP- 1. Persistent but improved bulky mediastinal and right hilar/infrahilar lymphadenopathy and significant decrease in size of the right lower lobe mass  and obstructive pneumonitis. Residual branching endobronchial tumor in the right lower lobe. 2. No findings for pulmonary metastatic nodules or abdominal/pelvic lymphadenopathy. 3. Stable infrarenal abdominal aortic aneurysm.      04/12/2017 Treatment Plan Change    Tx deferred x 1 week due to hyponatremia requiring work-up.      08/19/2017 Imaging    MRI Brain w/ and w/o contrast: IMPRESSION: New 4 x 8 mm metastasis at the medial left posterior frontal lobe. Three other questionable small metastases, 1 at the posterolateral inferior left parietal lobe, 1 in the right posterior parietal white matter, and 1 in the left frontal region. These cannot be confirmed on multiple sequences. Therefore, it may be worthwhile obtaining a 3 tesla examination, particularly if stereotactic radiotherapy is considered.      08/19/2017 Imaging    CT C/A/P: IMPRESSION: 1. Right lower lobe lung mass is increased since 08/04/2017 and significantly increased since 04/07/2017, with new occlusion of the right lower lobe bronchus and complete right lower lobe atelectasis. 2. Bulky ipsilateral hilar and right paratracheal and subcarinal mediastinal adenopathy, stable since 08/04/2017 and significantly increased since 04/07/2017. 3. New hypodense pancreatic  tail mass, most compatible with pancreatic metastasis . 4. No additional sites of metastatic disease in the chest, abdomen or pelvis. 5. New trace dependent right pleural effusion. Increased interlobular septal thickening in the right middle lobe, favor localized pulmonary edema, cannot exclude lymphangitic tumor spread. 6. Small pericardial effusion, stable since 08/04/2017, new since 04/07/2017 . 7. Three-vessel coronary atherosclerosis. 8. Aortic Atherosclerosis (ICD10-I70.0). Mildly increased 4.4 cm Abdominal Aortic Aneurysm (ICD10-I71.9). Recommend follow-up aortic ultrasound in 1 year. This recommendation follows ACR consensus guidelines:  White Paper of the ACR Incidental Findings Committee II on Vascular Findings. J Am Coll Radiol 2013; 10:789-794. 9. Mild colonic diverticulosis.       08/19/2017 Progression    New brain mets, enlarging right lower lobe mass, bulky chest lymphadenopathy, new pancreatic tail lesion      08/29/2017 - 09/09/2017 Radiation Therapy    Under the care of Dr. Lisbeth Renshaw   Radiation treatment dates:   08/29/2017 - 09/09/2017  Site/dose:   The brain and right lung were each treated to 30 Gy in 10 fractions of 3 Gy.        11/11/2017 Imaging    CT C/A/P: Interval decrease in size of mediastinal and right hilar lymphadenopathy down to 0.8 cm.  Decreased right middle lobe and right lower lobe mass lesions.  Unfortunately, multiple new skeletal lesions are noted including pathological fracture of the second rib on the right.  Mass in the tail of the pancreas is increasing in size now measuring 6.7 cm.  Additional new metastatic lesion in the perinephric fat measuring 1.9 cm.        Problem List Patient Active Problem List   Diagnosis Date Noted  . Pancreatic mass [K86.9]   . Bone metastasis ([place]) [C79.51] 11/16/2017  . Intractable vomiting with nausea [R11.2] 09/13/2017  . Intractable vomiting [R11.10] 09/13/2017  . 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 Goals of care, counseling/discussion [Z71.89] 03/21/2017  . 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 Tobacco abuse [Z72.0] 08/17/2016  . Small cell lung cancer ([place]) [C34.90] 07/21/2016  . Melena [K92.1] 04/17/2012    Past Medical History Past Medical History:  Diagnosis Date  . Elevated cholesterol   . GERD (gastroesophageal reflux disease)   . GI bleed   . Goals of care, counseling/discussion 03/21/2017  . Hypertension   . Peripheral neuropathy due to chemotherapy  ([place]) 02/22/2017  . Small cell carcinoma ([place]) 07/21/2016    Past Surgical History Past Surgical History:  Procedure Laterality Date  . ABDOMINAL HYSTERECTOMY     partial  . CESAREAN SECTION    . COLONOSCOPY    . EUS N/A 12/01/2017   Procedure: UPPER ENDOSCOPIC ULTRASOUND (EUS) RADIAL;  Surgeon: Milus Banister, MD;  Location: [facility];  Service: Endoscopy;  Laterality: N/A;  . PORTACATH PLACEMENT Left 08/04/2016   Procedure: INSERTION PORT-A-CATH LEFT SUBCLAVIAN;  Surgeon: Aviva Signs, MD;  Location: [facility] OR[facility];  Service: General;  Laterality: Left;  . TONSILLECTOMY    . VIDEO BRONCHOSCOPY WITH ENDOBRONCHIAL ULTRASOUND N/A 07/16/2016   Procedure: VIDEO BRONCHOSCOPY WITH ENDOBRONCHIAL ULTRASOUND with ultrasound transbronchial biopsy of node #7 and right lower lobe lesion;  Surgeon: Grace Isaac, MD;  Location: [facility] OR;  Service: Thoracic;  Laterality: N/A;    Family History Family History  Problem Relation Age of Onset  . Heart disease Mother   . Heart disease Father   .  COPD Sister   . HIV Brother      Social History  reports that Lydia Moore has been smoking cigarettes.  Lydia Moore has a 17.50 pack-year smoking history. Lydia Moore has never used smokeless tobacco. Lydia Moore reports that Lydia Moore drinks alcohol. Lydia Moore reports that Lydia Moore uses drugs. Drug: Marijuana. Frequency: 1.00 time per week.  Medications  Current Outpatient Medications:  .  amLODipine (NORVASC) 5 MG tablet, Take 5 mg by mouth daily., Disp: , Rfl: 3 .  dexamethasone (DECADRON) 2 MG tablet, Take 1 tablet (2 mg total) by mouth 2 (two) times daily with a meal., Disp: 60 tablet, Rfl: 1 .  DULoxetine (CYMBALTA) 60 MG capsule, Take 1 capsule (60 mg total) by mouth daily., Disp: 30 capsule, Rfl: 3 .  IPILIMUMAB IV, Inject every 21 ( twenty-one) days into the vein., Disp: , Rfl:  .  loratadine (CLARITIN) 10 MG tablet, Take 1 tablet (10 mg total) by mouth daily as needed. (Patient taking differently: Take 10 mg by mouth daily. ), Disp: 30 tablet, Rfl:  3 .  morphine (MSIR) 15 MG tablet, Take 1 tablet (15 mg total) by mouth every 4 (four) hours as needed for severe pain., Disp: 30 tablet, Rfl: 0 .  NIVOLUMAB IV, Inject every 21 ( twenty-one) days into the vein., Disp: , Rfl:  .  potassium chloride SA (K-DUR,KLOR-CON) 20 MEQ tablet, Take 1 tablet (20 mEq total) by mouth 2 (two) times daily. (Patient not taking: Reported on 12/28/2017), Disp: 60 tablet, Rfl: 1 .  senna (SENOKOT) 8.6 MG TABS tablet, Take 1 tablet (8.6 mg total) by mouth at bedtime., Disp: 120 each, Rfl: 0 .  zolpidem (AMBIEN) 10 MG tablet, Take 10 mg by mouth at bedtime as needed. for sleep, Disp: , Rfl: 3  Allergies Patient has no known allergies.  Review of Systems Review of Systems - Oncology ROS as per HPI otherwise 12 point ROS is negative.   Physical Exam  Vitals Wt Readings from Last 3 Encounters:  12/28/17 120 lb 9.6 oz (54.7 kg)  12/14/17 126 lb (57.2 kg)  12/07/17 130 lb 12.8 oz (59.3 kg)   Temp Readings from Last 3 Encounters:  12/28/17 97.6 F (36.4 C) (Oral)  12/14/17 97.8 F (36.6 C) (Oral)  12/07/17 98.4 F (36.9 C) (Oral)   BP Readings from Last 3 Encounters:  12/28/17 (!) 153/98  12/14/17 115/84  12/07/17 126/90   Pulse Readings from Last 3 Encounters:  12/28/17 74  12/14/17 91  12/07/17 95   Constitutional: Well-developed, well-nourished, and in no distress.  Chronically ill appearing.   HENT: Head: Normocephalic and atraumatic.  Mouth/Throat: No oropharyngeal exudate. Mucosa moist. Eyes: Pupils are equal, round, and reactive to light. Conjunctivae are normal. No scleral icterus.  Neck: Normal range of motion. Neck supple. No JVD present.  Cardiovascular: Normal rate, regular rhythm and normal heart sounds.  Exam reveals no gallop and no friction rub.   No murmur heard. Pulmonary/Chest: Effort normal and breath sounds normal. No respiratory distress. No wheezes.No rales.  Abdominal: Soft. Bowel sounds are normal. No distension. There is  no tenderness. There is no guarding.  Musculoskeletal: No edema or tenderness.  Lymphadenopathy: No cervical, axillary or supraclavicular adenopathy.  Neurological: Alert and oriented to person, place, and time. No cranial nerve deficit.  Skin: Skin is warm and dry. No rash noted. No erythema. No pallor.  Psychiatric: Affect and judgment normal.   Labs Appointment on 12/28/2017  Component Date Value Ref Range Status  . Sodium 12/28/2017 136  135 - 145 mmol/L Final  . Potassium 12/28/2017 3.8  3.5 - 5.1 mmol/L Final  . Chloride 12/28/2017 99* 101 - 111 mmol/L Final  . CO2 12/28/2017 23  22 - 32 mmol/L Final  . Glucose, Bld 12/28/2017 161* 65 - 99 mg/dL Final  . BUN 12/28/2017 28* 6 - 20 mg/dL Final  . Creatinine, Ser 12/28/2017 1.02* 0.44 - 1.00 mg/dL Final  . Calcium 12/28/2017 8.6* 8.9 - 10.3 mg/dL Final  . Total Protein 12/28/2017 7.5  6.5 - 8.1 g/dL Final  . Albumin 12/28/2017 3.5  3.5 - 5.0 g/dL Final  . AST 12/28/2017 31  15 - 41 U/L Final  . ALT 12/28/2017 19  14 - 54 U/L Final  . Alkaline Phosphatase 12/28/2017 58  38 - 126 U/L Final  . Total Bilirubin 12/28/2017 0.7  0.3 - 1.2 mg/dL Final  . GFR calc non Af Amer 12/28/2017 >60  >60 mL/min Final  . GFR calc Af Amer 12/28/2017 >60  >60 mL/min Final   Comment: (NOTE) The eGFR has been calculated using the CKD EPI equation. This calculation has not been validated in all clinical situations. eGFR's persistently <60 mL/min signify possible Chronic Kidney Disease.   Georgiann Hahn gap 12/28/2017 14  5 - 15 Final   Performed at [facility], [address] [place], [place] [address]  . WBC 12/28/2017 7.2  4.0 - 10.5 K/uL Final  . RBC 12/28/2017 4.05  3.87 - 5.11 MIL/uL Final  . Hemoglobin 12/28/2017 11.9* 12.0 - 15.0 g/dL Final  . HCT 12/28/2017 38.4  36.0 - 46.0 % Final  . MCV 12/28/2017 94.8  78.0 - 100.0 fL Final  . MCH 12/28/2017 29.4  26.0 - 34.0 pg Final  . MCHC 12/28/2017 31.0  30.0 - 36.0 g/dL Final  . RDW 12/28/2017 15.8*  11.5 - 15.5 % Final  . Platelets 12/28/2017 172  150 - 400 K/uL Final  . Neutrophils Relative % 12/28/2017 88  % Final  . Neutro Abs 12/28/2017 6.3  1.7 - 7.7 K/uL Final  . Lymphocytes Relative 12/28/2017 6  % Final  . Lymphs Abs 12/28/2017 0.5* 0.7 - 4.0 K/uL Final  . Monocytes Relative 12/28/2017 6  % Final  . Monocytes Absolute 12/28/2017 0.4  0.1 - 1.0 K/uL Final  . Eosinophils Relative 12/28/2017 0  % Final  . Eosinophils Absolute 12/28/2017 0.0  0.0 - 0.7 K/uL Final  . Basophils Relative 12/28/2017 0  % Final  . Basophils Absolute 12/28/2017 0.0  0.0 - 0.1 K/uL Final   Performed at [facility], [address] [place], [place] [address]  . LDH 12/28/2017 194* 98 - 192 U/L Final   Performed at [facility], [address] [place], [place] [address]  . TSH 12/28/2017 1.509  0.350 - 4.500 uIU/mL Final   Comment: Performed by a 3rd Generation assay with a functional sensitivity of <=0.01 uIU/mL. Performed at [facility], [address], [place], [place] [address]       Zoila Shutter MD

## 2018-01-11 ENCOUNTER — Telehealth (HOSPITAL_COMMUNITY): Payer: Self-pay

## 2018-01-11 NOTE — Telephone Encounter (Signed)
Patients daughter called stating patient was having back pain not relieved by pain medication. She has a decreased appetite and lays around or sleeps 90 % of the day according to daughter. Daughter states she took patient to [facility] on 2/15 and they were basically told patient has 2 months left to live. She should not be receiving the immunotherapy because it is not working. They were told there is one other chemo to try but it will only work for about 2 months and patient will be nauseated the whole time she is on it. Daughter states the physicians at [facility] were supposed to send their note. [facility] also tried to switch her to long acting pain medication. According to the daughter, they put her on Morphine 15 mg sustained release and Oxycodone 5-10 mg every 4 hours PRN breakthrough pain. Daughter states patient is having to take 10 mg every 4 hours to control her pain. Daughter isn't sure patient will come in for treatment that is scheduled on 3/6. She states her mom told her she is tired. Daughter is also not sure patient will go for the MRI. Patient stated she didn't want to know if the cancer was back in her brain. Explained to daughter I would review with Dr. Walden Field and call her back.

## 2018-01-12 ENCOUNTER — Telehealth (HOSPITAL_COMMUNITY): Payer: Self-pay

## 2018-01-12 NOTE — Telephone Encounter (Signed)
Called and spoke with the daughter to follow up on yesterdays telephone conversation regarding pain medications, pain relief, and to see how the patient was doing today for review with Dr. Walden Field.  The daughter stated she has called [facility] and [place] [facility] and will be at her mothers house today at 34 for care.  Instructed the daughter to call if there was anything we can do for her or the family with understanding verbalized.

## 2018-01-13 NOTE — Telephone Encounter (Signed)
Followed up yesterdays call to be sure patient was receiving Hospice care. Spoke with daughter, Elmyra Ricks.  hospice and palliative care did come out and admit patient yesterday (2/28). Daughter states they increased her long acting pain medication and her pain is better controlled now. She is not having to take any break through pain meds. Explained to daughter we would go ahead and cancel her moms upcoming scans and appts but to call if they needed anything. Daughter verbalized understanding.

## 2018-01-18 ENCOUNTER — Other Ambulatory Visit (HOSPITAL_COMMUNITY): Payer: Managed Care, Other (non HMO)

## 2018-01-18 ENCOUNTER — Ambulatory Visit (HOSPITAL_COMMUNITY): Payer: Managed Care, Other (non HMO)

## 2018-01-19 ENCOUNTER — Inpatient Hospital Stay: Admission: RE | Admit: 2018-01-19 | Payer: Managed Care, Other (non HMO) | Source: Ambulatory Visit

## 2018-01-24 ENCOUNTER — Inpatient Hospital Stay: Admission: RE | Admit: 2018-01-24 | Payer: Self-pay | Source: Ambulatory Visit | Admitting: Radiation Oncology

## 2018-01-25 ENCOUNTER — Ambulatory Visit (HOSPITAL_COMMUNITY): Payer: Managed Care, Other (non HMO) | Admitting: Adult Health

## 2018-02-08 ENCOUNTER — Other Ambulatory Visit (HOSPITAL_COMMUNITY): Payer: Managed Care, Other (non HMO)

## 2018-02-08 ENCOUNTER — Ambulatory Visit (HOSPITAL_COMMUNITY): Payer: Managed Care, Other (non HMO) | Admitting: Hematology

## 2018-02-08 ENCOUNTER — Ambulatory Visit (HOSPITAL_COMMUNITY): Payer: Managed Care, Other (non HMO)

## 2018-02-13 DEATH — deceased

## 2018-03-15 DEATH — deceased
